# Patient Record
Sex: Female | Born: 1940 | Race: Black or African American | Hispanic: No | Marital: Single | Smoking: Former smoker
Health system: Southern US, Community
[De-identification: ages and names within clinical notes are randomized; demographics above are authoritative.]

## PROBLEM LIST (undated history)

## (undated) DIAGNOSIS — M1990 Unspecified osteoarthritis, unspecified site: Secondary | ICD-10-CM

## (undated) DIAGNOSIS — H409 Unspecified glaucoma: Secondary | ICD-10-CM

## (undated) DIAGNOSIS — E079 Disorder of thyroid, unspecified: Secondary | ICD-10-CM

## (undated) DIAGNOSIS — E039 Hypothyroidism, unspecified: Secondary | ICD-10-CM

## (undated) DIAGNOSIS — M2550 Pain in unspecified joint: Secondary | ICD-10-CM

## (undated) DIAGNOSIS — I10 Essential (primary) hypertension: Secondary | ICD-10-CM

## (undated) DIAGNOSIS — I6529 Occlusion and stenosis of unspecified carotid artery: Secondary | ICD-10-CM

## (undated) DIAGNOSIS — J300 Vasomotor rhinitis: Secondary | ICD-10-CM

## (undated) DIAGNOSIS — I272 Pulmonary hypertension, unspecified: Secondary | ICD-10-CM

## (undated) DIAGNOSIS — K219 Gastro-esophageal reflux disease without esophagitis: Secondary | ICD-10-CM

## (undated) DIAGNOSIS — T7840XA Allergy, unspecified, initial encounter: Secondary | ICD-10-CM

## (undated) DIAGNOSIS — J9611 Chronic respiratory failure with hypoxia: Secondary | ICD-10-CM

## (undated) DIAGNOSIS — J449 Chronic obstructive pulmonary disease, unspecified: Secondary | ICD-10-CM

## (undated) HISTORY — DX: Essential (primary) hypertension: I10

## (undated) HISTORY — DX: Disorder of thyroid, unspecified: E07.9

## (undated) HISTORY — PX: EYE SURGERY: SHX253

## (undated) HISTORY — DX: Pain in unspecified joint: M25.50

## (undated) HISTORY — DX: Gastro-esophageal reflux disease without esophagitis: K21.9

## (undated) HISTORY — DX: Unspecified osteoarthritis, unspecified site: M19.90

## (undated) HISTORY — PX: CATARACT EXTRACTION: SUR2

## (undated) HISTORY — DX: Occlusion and stenosis of unspecified carotid artery: I65.29

## (undated) HISTORY — PX: ABDOMINAL HYSTERECTOMY: SHX81

## (undated) HISTORY — DX: Allergy, unspecified, initial encounter: T78.40XA

---

## 1997-10-11 ENCOUNTER — Other Ambulatory Visit: Admission: RE | Admit: 1997-10-11 | Discharge: 1997-10-11 | Payer: Self-pay | Admitting: Family Medicine

## 1998-07-22 ENCOUNTER — Emergency Department (HOSPITAL_COMMUNITY): Admission: EM | Admit: 1998-07-22 | Discharge: 1998-07-22 | Payer: Self-pay | Admitting: Emergency Medicine

## 2000-06-29 ENCOUNTER — Ambulatory Visit (HOSPITAL_COMMUNITY): Admission: RE | Admit: 2000-06-29 | Discharge: 2000-06-29 | Payer: Self-pay

## 2001-07-03 ENCOUNTER — Ambulatory Visit (HOSPITAL_COMMUNITY): Admission: RE | Admit: 2001-07-03 | Discharge: 2001-07-03 | Payer: Self-pay | Admitting: Family Medicine

## 2001-12-25 ENCOUNTER — Ambulatory Visit (HOSPITAL_COMMUNITY): Admission: RE | Admit: 2001-12-25 | Discharge: 2001-12-25 | Payer: Self-pay | Admitting: Internal Medicine

## 2001-12-26 ENCOUNTER — Ambulatory Visit (HOSPITAL_COMMUNITY): Admission: RE | Admit: 2001-12-26 | Discharge: 2001-12-26 | Payer: Self-pay | Admitting: Internal Medicine

## 2001-12-26 ENCOUNTER — Encounter: Payer: Self-pay | Admitting: Internal Medicine

## 2002-07-05 ENCOUNTER — Ambulatory Visit (HOSPITAL_COMMUNITY): Admission: RE | Admit: 2002-07-05 | Discharge: 2002-07-05 | Payer: Self-pay | Admitting: Family Medicine

## 2002-09-24 ENCOUNTER — Encounter: Payer: Self-pay | Admitting: Internal Medicine

## 2002-09-24 ENCOUNTER — Ambulatory Visit (HOSPITAL_COMMUNITY): Admission: RE | Admit: 2002-09-24 | Discharge: 2002-09-24 | Payer: Self-pay

## 2003-05-20 ENCOUNTER — Emergency Department (HOSPITAL_COMMUNITY): Admission: EM | Admit: 2003-05-20 | Discharge: 2003-05-20 | Payer: Self-pay | Admitting: Emergency Medicine

## 2003-07-17 ENCOUNTER — Ambulatory Visit (HOSPITAL_COMMUNITY): Admission: RE | Admit: 2003-07-17 | Discharge: 2003-07-17 | Payer: Self-pay | Admitting: Family Medicine

## 2003-11-30 ENCOUNTER — Emergency Department (HOSPITAL_COMMUNITY): Admission: EM | Admit: 2003-11-30 | Discharge: 2003-11-30 | Payer: Self-pay | Admitting: Emergency Medicine

## 2004-03-16 ENCOUNTER — Ambulatory Visit (HOSPITAL_COMMUNITY): Admission: RE | Admit: 2004-03-16 | Discharge: 2004-03-16 | Payer: Self-pay | Admitting: Gastroenterology

## 2004-03-16 ENCOUNTER — Encounter (INDEPENDENT_AMBULATORY_CARE_PROVIDER_SITE_OTHER): Payer: Self-pay | Admitting: Specialist

## 2004-11-02 ENCOUNTER — Emergency Department (HOSPITAL_COMMUNITY): Admission: EM | Admit: 2004-11-02 | Discharge: 2004-11-02 | Payer: Self-pay | Admitting: Emergency Medicine

## 2004-11-12 ENCOUNTER — Emergency Department (HOSPITAL_COMMUNITY): Admission: EM | Admit: 2004-11-12 | Discharge: 2004-11-12 | Payer: Self-pay | Admitting: Emergency Medicine

## 2005-01-27 ENCOUNTER — Ambulatory Visit (HOSPITAL_COMMUNITY): Admission: RE | Admit: 2005-01-27 | Discharge: 2005-01-27 | Payer: Self-pay | Admitting: Internal Medicine

## 2005-03-03 ENCOUNTER — Encounter: Admission: RE | Admit: 2005-03-03 | Discharge: 2005-03-03 | Payer: Self-pay | Admitting: Family Medicine

## 2006-01-28 ENCOUNTER — Ambulatory Visit (HOSPITAL_COMMUNITY): Admission: RE | Admit: 2006-01-28 | Discharge: 2006-01-28 | Payer: Self-pay | Admitting: Family Medicine

## 2006-10-05 ENCOUNTER — Encounter: Admission: RE | Admit: 2006-10-05 | Discharge: 2006-10-05 | Payer: Self-pay | Admitting: Family Medicine

## 2007-02-02 ENCOUNTER — Ambulatory Visit (HOSPITAL_COMMUNITY): Admission: RE | Admit: 2007-02-02 | Discharge: 2007-02-02 | Payer: Self-pay | Admitting: Family Medicine

## 2007-08-24 ENCOUNTER — Ambulatory Visit (HOSPITAL_COMMUNITY): Admission: RE | Admit: 2007-08-24 | Discharge: 2007-08-24 | Payer: Self-pay | Admitting: Internal Medicine

## 2008-02-05 ENCOUNTER — Ambulatory Visit (HOSPITAL_COMMUNITY): Admission: RE | Admit: 2008-02-05 | Discharge: 2008-02-05 | Payer: Self-pay | Admitting: Family Medicine

## 2008-04-17 ENCOUNTER — Inpatient Hospital Stay (HOSPITAL_COMMUNITY): Admission: EM | Admit: 2008-04-17 | Discharge: 2008-04-21 | Payer: Self-pay | Admitting: Emergency Medicine

## 2009-02-10 ENCOUNTER — Ambulatory Visit (HOSPITAL_COMMUNITY): Admission: RE | Admit: 2009-02-10 | Discharge: 2009-02-10 | Payer: Self-pay | Admitting: Internal Medicine

## 2009-08-20 ENCOUNTER — Ambulatory Visit (HOSPITAL_COMMUNITY): Admission: RE | Admit: 2009-08-20 | Discharge: 2009-08-20 | Payer: Self-pay | Admitting: Internal Medicine

## 2009-09-29 ENCOUNTER — Ambulatory Visit: Payer: Self-pay | Admitting: Vascular Surgery

## 2009-10-19 ENCOUNTER — Emergency Department (HOSPITAL_COMMUNITY): Admission: EM | Admit: 2009-10-19 | Discharge: 2009-10-19 | Payer: Self-pay | Admitting: Emergency Medicine

## 2009-11-15 ENCOUNTER — Emergency Department (HOSPITAL_COMMUNITY): Admission: EM | Admit: 2009-11-15 | Discharge: 2009-11-15 | Payer: Self-pay | Admitting: Emergency Medicine

## 2009-11-18 ENCOUNTER — Emergency Department (HOSPITAL_COMMUNITY): Admission: EM | Admit: 2009-11-18 | Discharge: 2009-11-19 | Payer: Self-pay | Admitting: Emergency Medicine

## 2009-12-02 ENCOUNTER — Ambulatory Visit: Payer: Self-pay | Admitting: Vascular Surgery

## 2009-12-03 ENCOUNTER — Emergency Department (HOSPITAL_COMMUNITY): Admission: EM | Admit: 2009-12-03 | Discharge: 2009-12-03 | Payer: Self-pay | Admitting: Emergency Medicine

## 2009-12-16 ENCOUNTER — Ambulatory Visit (HOSPITAL_COMMUNITY): Admission: RE | Admit: 2009-12-16 | Discharge: 2009-12-16 | Payer: Self-pay | Admitting: Vascular Surgery

## 2009-12-16 ENCOUNTER — Ambulatory Visit: Payer: Self-pay | Admitting: Surgery

## 2009-12-17 ENCOUNTER — Inpatient Hospital Stay (HOSPITAL_COMMUNITY): Admission: RE | Admit: 2009-12-17 | Discharge: 2009-12-18 | Payer: Self-pay | Admitting: Vascular Surgery

## 2009-12-30 ENCOUNTER — Ambulatory Visit: Payer: Self-pay | Admitting: Vascular Surgery

## 2010-02-11 ENCOUNTER — Ambulatory Visit (HOSPITAL_COMMUNITY): Admission: RE | Admit: 2010-02-11 | Discharge: 2010-02-11 | Payer: Self-pay | Admitting: Family Medicine

## 2010-02-21 IMAGING — US US RENAL
1 series · 14 of 22 positions shown · non-contrast
Comparison: CT abdomen and pelvis 10/05/2006.

CLINICAL DATA: Acute renal failure.

ULTRASOUND URINARY TRACT COMPLETE 04/17/2008:

[Series 1: us renal · 0.28mm/px · 14 of 22 slices shown]
[im 1/22]
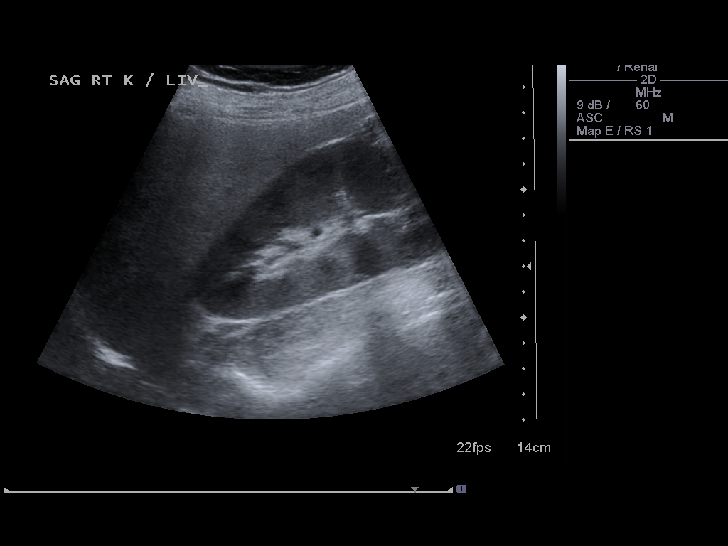
[im 3/22]
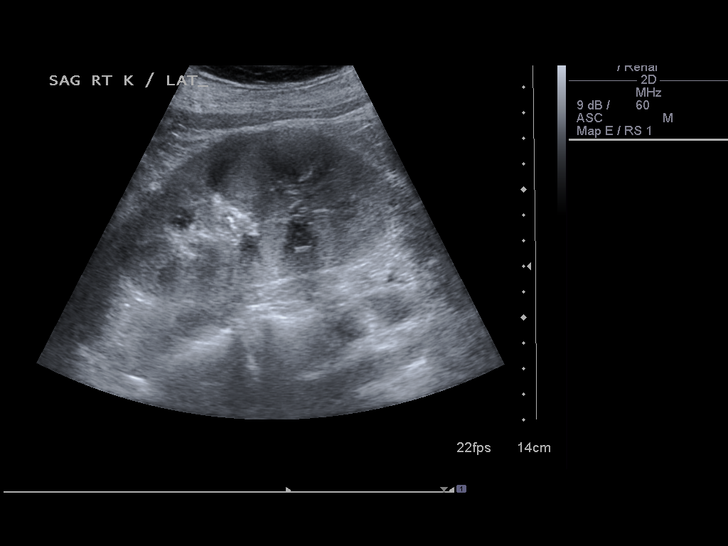
[im 4/22]
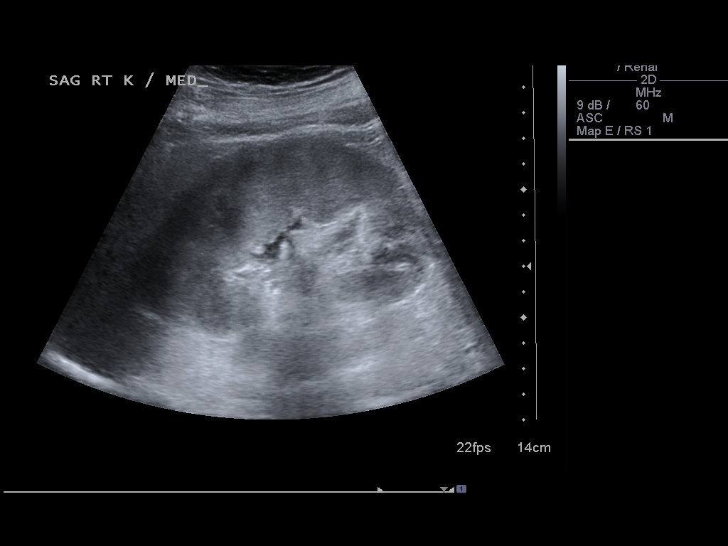
[im 6/22]
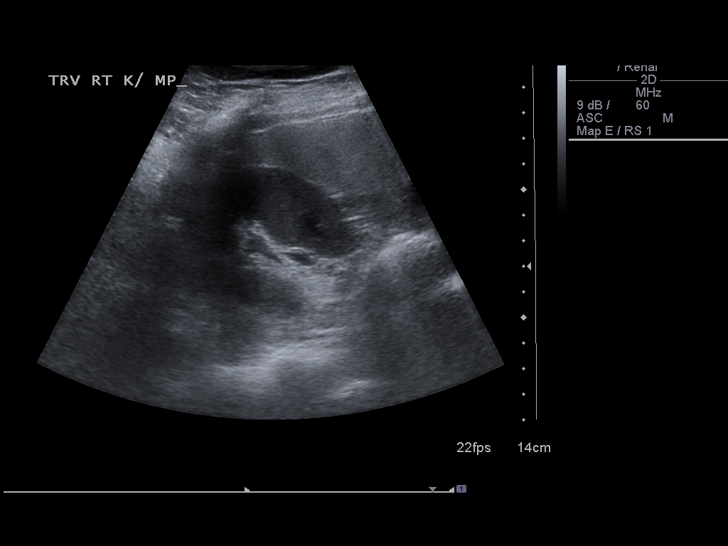
[im 8/22]
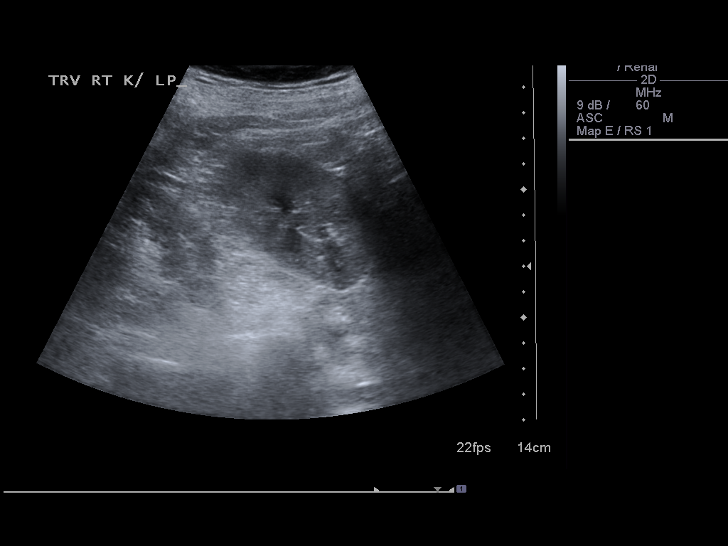
[im 9/22]
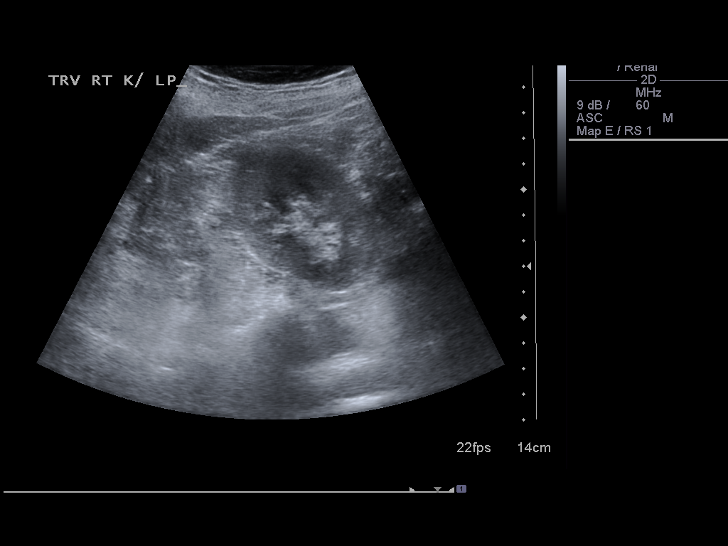
[im 11/22]
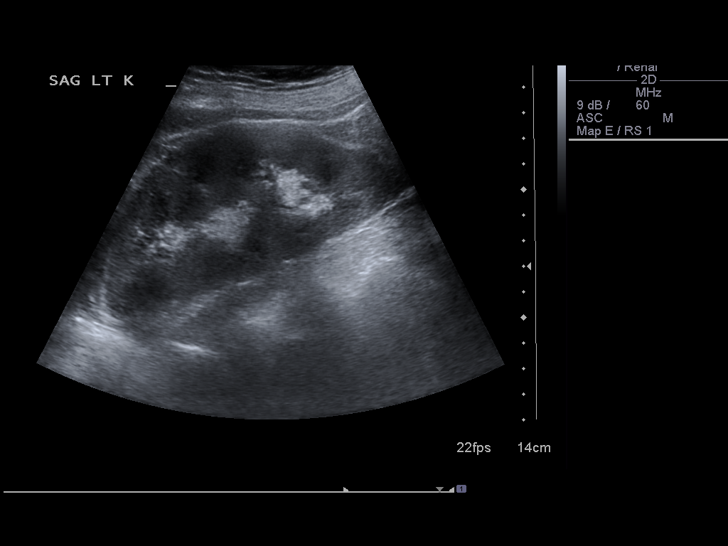
[im 12/22]
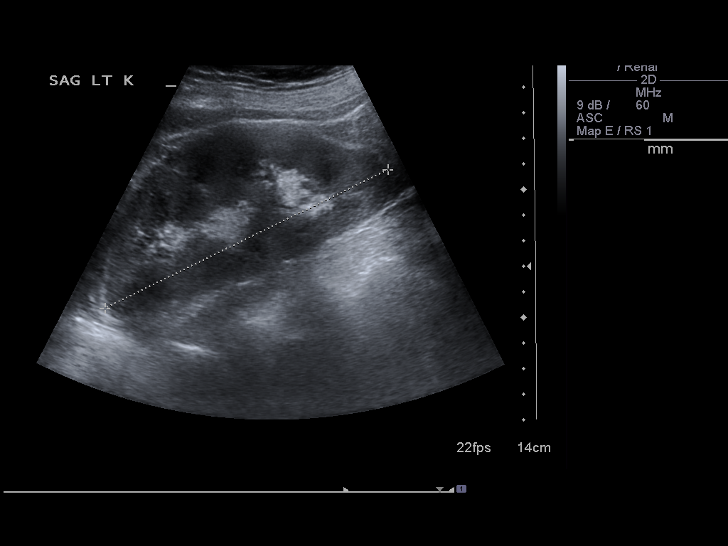
[im 14/22]
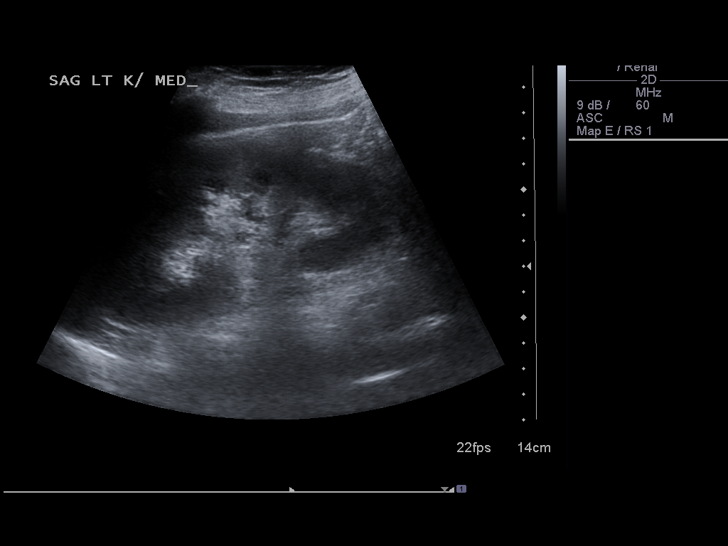
[im 15/22]
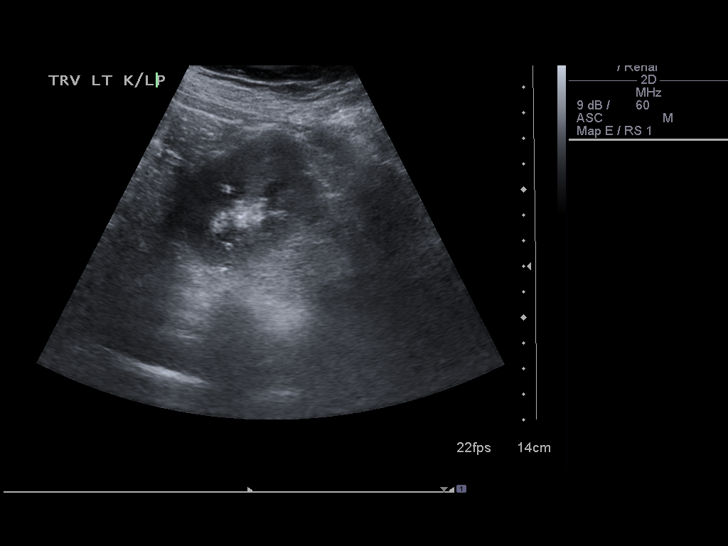
[im 17/22]
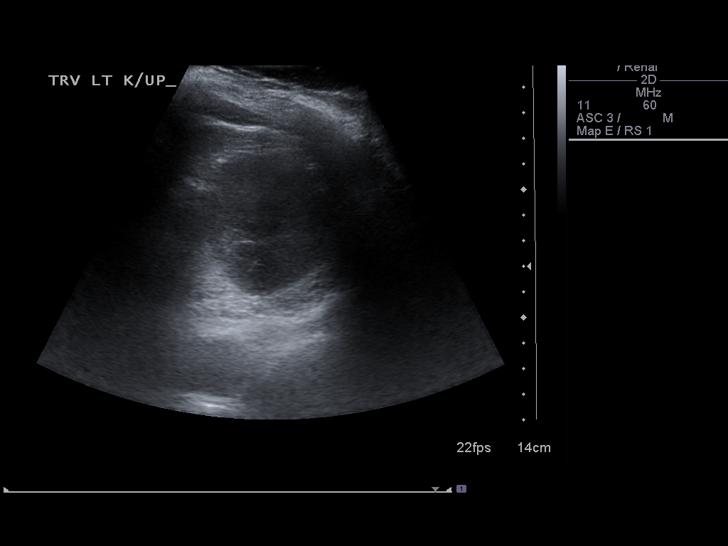
[im 19/22]
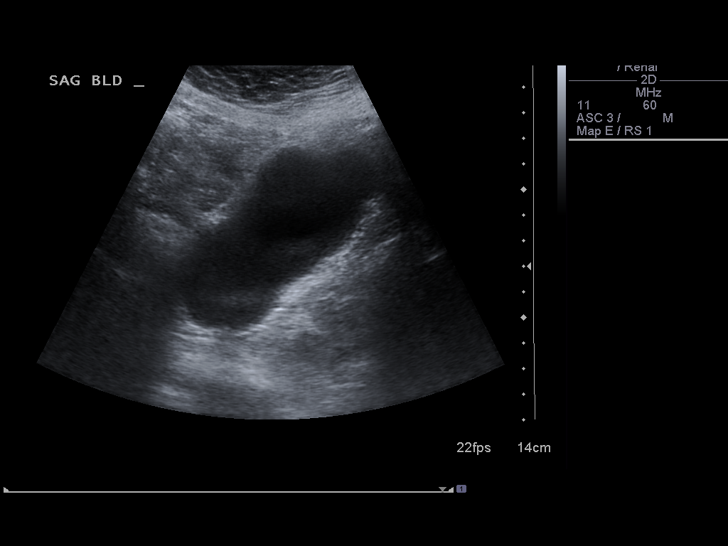
[im 20/22]
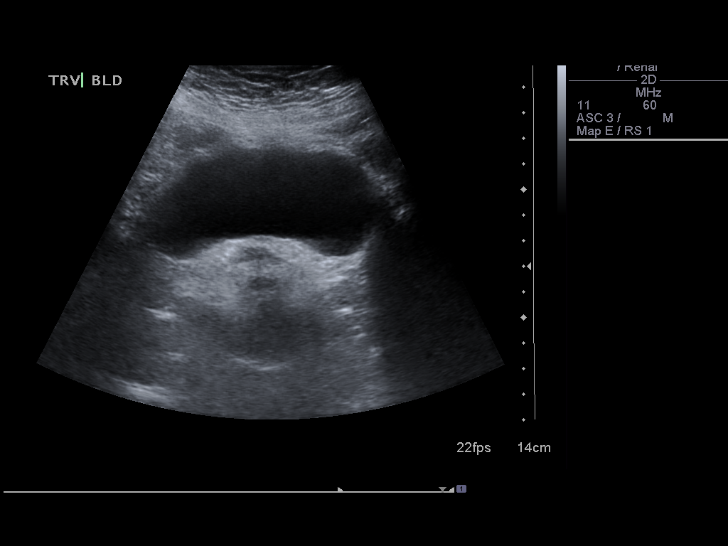
[im 22/22]
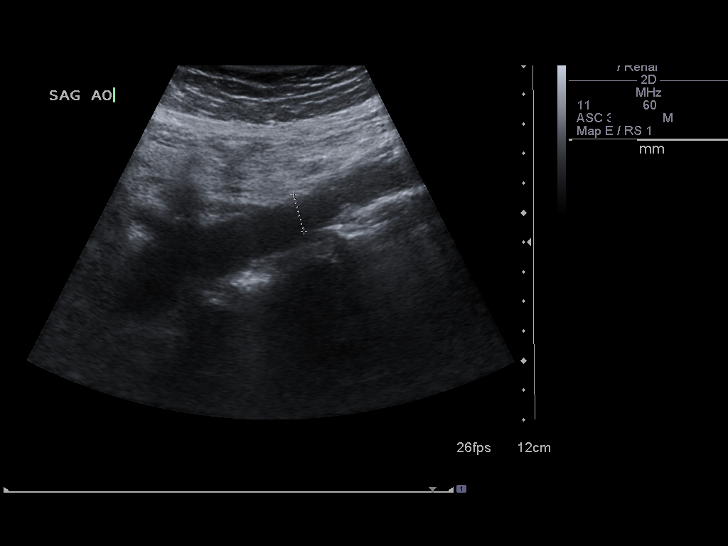

[14 of 22 positions shown; findings below may reference images not displayed]

FINDINGS: Right Kidney:  No hydronephrosis.  Well-preserved cortex.
Normal parenchymal echotexture without focal parenchymal
abnormalities.  Approximately 11.6 cm in length.

      Left Kidney: No hydronephrosis.  Well-preserved cortex.
Normal parenchymal echotexture without focal abnormalities.
Approximately 12.3 cm in length.

      Bladder: Normal in appearance.
IMPRESSION: Normal urinary tract ultrasound.

## 2010-05-02 ENCOUNTER — Encounter: Payer: Self-pay | Admitting: Emergency Medicine

## 2010-05-03 ENCOUNTER — Encounter: Payer: Self-pay | Admitting: Family Medicine

## 2010-06-25 LAB — COMPREHENSIVE METABOLIC PANEL
AST: 21 U/L (ref 0–37)
Albumin: 3.8 g/dL (ref 3.5–5.2)
Alkaline Phosphatase: 74 U/L (ref 39–117)
CO2: 22 mEq/L (ref 19–32)
Chloride: 109 mEq/L (ref 96–112)
GFR calc Af Amer: >60 mL/min (ref >60)
GFR calc non Af Amer: >60 mL/min (ref >60)
Potassium: 4.2 mEq/L (ref 3.5–5.1)
Total Bilirubin: 0.4 mg/dL (ref 0.3–1.2)

## 2010-06-25 LAB — CBC
Hemoglobin: 11.9 g/dL — ABNORMAL LOW (ref 12.0–15.0)
MCH: 30.4 pg (ref 26.0–34.0)
MCV: 93 fL (ref 78.0–100.0)
MCV: 93.9 fL (ref 78.0–100.0)
Platelets: 217 10*3/uL (ref 150–400)
Platelets: 217 10*3/uL (ref 150–400)
RBC: 3.7 MIL/uL — ABNORMAL LOW (ref 3.87–5.11)
RBC: 3.91 MIL/uL (ref 3.87–5.11)
RDW: 14.4 % (ref 11.5–15.5)
WBC: 11.9 10*3/uL — ABNORMAL HIGH (ref 4.0–10.5)
WBC: 8.9 10*3/uL (ref 4.0–10.5)

## 2010-06-25 LAB — BASIC METABOLIC PANEL
BUN: 4 mg/dL — ABNORMAL LOW (ref 6–23)
Calcium: 9.2 mg/dL (ref 8.4–10.5)
Chloride: 112 mEq/L (ref 96–112)
Creatinine, Ser: 0.75 mg/dL (ref 0.4–1.2)
GFR calc Af Amer: >60 mL/min (ref >60)
GFR calc non Af Amer: >60 mL/min (ref >60)

## 2010-06-25 LAB — POCT I-STAT, CHEM 8
BUN: 9 mg/dL (ref 6–23)
Creatinine, Ser: 0.8 mg/dL (ref 0.4–1.2)
Potassium: 3.8 mEq/L (ref 3.5–5.1)
Sodium: 142 mEq/L (ref 135–145)
TCO2: 23 mmol/L (ref 0–100)

## 2010-06-25 LAB — URINALYSIS, ROUTINE W REFLEX MICROSCOPIC
Bilirubin Urine: NEGATIVE
Hgb urine dipstick: NEGATIVE
Ketones, ur: NEGATIVE mg/dL
Nitrite: NEGATIVE
Specific Gravity, Urine: 1.016 (ref 1.005–1.030)
pH: 6 (ref 5.0–8.0)

## 2010-06-25 LAB — CROSSMATCH

## 2010-06-25 LAB — SURGICAL PCR SCREEN
MRSA, PCR: NEGATIVE
Staphylococcus aureus: NEGATIVE

## 2010-06-25 LAB — URINE MICROSCOPIC-ADD ON

## 2010-06-25 LAB — ABO/RH: ABO/RH(D): O POS

## 2010-06-26 LAB — URINALYSIS, ROUTINE W REFLEX MICROSCOPIC
Bilirubin Urine: NEGATIVE
Nitrite: NEGATIVE
Specific Gravity, Urine: 1.006 (ref 1.005–1.030)
Urobilinogen, UA: 0.2 mg/dL (ref 0.0–1.0)
pH: 6.5 (ref 5.0–8.0)

## 2010-06-26 LAB — COMPREHENSIVE METABOLIC PANEL
ALT: 14 U/L (ref 0–35)
Alkaline Phosphatase: 81 U/L (ref 39–117)
BUN: 4 mg/dL — ABNORMAL LOW (ref 6–23)
CO2: 23 mEq/L (ref 19–32)
GFR calc non Af Amer: >60 mL/min (ref >60)
Glucose, Bld: 103 mg/dL — ABNORMAL HIGH (ref 70–99)
Potassium: 3.4 mEq/L — ABNORMAL LOW (ref 3.5–5.1)
Sodium: 140 mEq/L (ref 135–145)

## 2010-06-26 LAB — CBC
HCT: 39.2 % (ref 36.0–46.0)
Hemoglobin: 13 g/dL (ref 12.0–15.0)
Hemoglobin: 13.5 g/dL (ref 12.0–15.0)
MCH: 30.8 pg (ref 26.0–34.0)
MCHC: 33.2 g/dL (ref 30.0–36.0)
MCHC: 33.1 g/dL (ref 30.0–36.0)
MCV: 92 fL (ref 78.0–100.0)
Platelets: 259 10*3/uL (ref 150–400)
RDW: 13.8 % (ref 11.5–15.5)

## 2010-06-26 LAB — BASIC METABOLIC PANEL
BUN: 4 mg/dL — ABNORMAL LOW (ref 6–23)
CO2: 22 mEq/L (ref 19–32)
Calcium: 9.5 mg/dL (ref 8.4–10.5)
Creatinine, Ser: 0.9 mg/dL (ref 0.4–1.2)
Glucose, Bld: 109 mg/dL — ABNORMAL HIGH (ref 70–99)

## 2010-06-26 LAB — POCT CARDIAC MARKERS: Myoglobin, poc: 42.9 ng/mL (ref 12–200)

## 2010-06-26 LAB — DIFFERENTIAL
Basophils Absolute: 0.1 10*3/uL (ref 0.0–0.1)
Basophils Absolute: 0 10*3/uL (ref 0.0–0.1)
Basophils Relative: 1 % (ref 0–1)
Basophils Relative: 0 % (ref 0–1)
Eosinophils Absolute: 0.1 10*3/uL (ref 0.0–0.7)
Eosinophils Absolute: 0 10*3/uL (ref 0.0–0.7)
Monocytes Relative: 4 % (ref 3–12)
Neutro Abs: 4 10*3/uL (ref 1.7–7.7)
Neutrophils Relative %: 46 % (ref 43–77)
Neutrophils Relative %: 67 % (ref 43–77)

## 2010-06-26 LAB — URINE CULTURE: Culture  Setup Time: 201108241840

## 2010-06-28 LAB — URINALYSIS, ROUTINE W REFLEX MICROSCOPIC
Hgb urine dipstick: NEGATIVE
Protein, ur: NEGATIVE mg/dL
Urobilinogen, UA: 0.2 mg/dL (ref 0.0–1.0)

## 2010-06-28 LAB — CBC
Hemoglobin: 13.4 g/dL (ref 12.0–15.0)
MCH: 31.5 pg (ref 26.0–34.0)
MCV: 93.4 fL (ref 78.0–100.0)
RBC: 4.26 MIL/uL (ref 3.87–5.11)

## 2010-06-28 LAB — BASIC METABOLIC PANEL
CO2: 23 mEq/L (ref 19–32)
Chloride: 110 mEq/L (ref 96–112)
Creatinine, Ser: 0.76 mg/dL (ref 0.4–1.2)
GFR calc Af Amer: >60 mL/min (ref >60)
Sodium: 142 mEq/L (ref 135–145)

## 2010-06-28 LAB — URINE MICROSCOPIC-ADD ON

## 2010-06-30 ENCOUNTER — Other Ambulatory Visit (INDEPENDENT_AMBULATORY_CARE_PROVIDER_SITE_OTHER): Payer: Medicare Other

## 2010-06-30 ENCOUNTER — Ambulatory Visit (INDEPENDENT_AMBULATORY_CARE_PROVIDER_SITE_OTHER): Payer: Medicare Other | Admitting: Vascular Surgery

## 2010-06-30 DIAGNOSIS — Z48812 Encounter for surgical aftercare following surgery on the circulatory system: Secondary | ICD-10-CM

## 2010-06-30 DIAGNOSIS — I6529 Occlusion and stenosis of unspecified carotid artery: Secondary | ICD-10-CM

## 2010-06-30 DIAGNOSIS — I771 Stricture of artery: Secondary | ICD-10-CM

## 2010-07-01 NOTE — Assessment & Plan Note (Signed)
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  Since that time her ischemic symptoms of the right upper extremity have been completely resolved with no claudication, numbness, weakness or cramping discomfort.  She occasionally will have some blurred vision in both eyes.  She denies any hemispheric TIAs such as hemiparesis, amaurosis fugax, diplopia, blurred vision or syncope.  CHRONIC MEDICAL PROBLEMS WHICH ARE STABLE: 1. Hypertension. 2. GERD. 3. Allergic rhinitis. 4. Hypothyroidism. 5. Bilateral cataract surgery. 6. Negative for coronary disease, diabetes or hyperlipidemia.  SOCIAL HISTORY:  She is single, has three children, is retired.  Quit smoking 30 years.  Does not use alcohol.  REVIEW OF SYSTEMS:  Negative for chest pain, dyspnea on exertion, anorexia or weight loss, orthopnea.  Ambulates long distances without claudication.  All other systems in complete review of systems are negative.  PHYSICAL EXAMINATION:  Blood pressure 125/71 in the right arm, 145/75 in the left arm; heart rate is 80; respirations 16.  Generally, she is a well-developed, well-nourished female in no apparent distress, alert and oriented x3.  HEENT:  Exam is normal for age.  EOMs intact.  Lungs: Clear to auscultation.  No rhonchi or wheezing.  Cardiovascular:  A regular rhythm, no murmurs.  Carotid pulses are 3+.  There is a harsh bruit on the right, radiated from the supraclavicular area.  Right upper extremity has a 3+ brachial and 2+ radial pulse.  Left upper extremity has 3+ brachial and 3+ radial pulse.  Abdomen is soft, nontender, with no  masses.  Lower extremity exam has 3+ dorsalis pedis pulses palpable.  Today I ordered a carotid duplex exam, which I reviewed and interpreted. She has a 20-mm gradient between the right left arm, which was 80 mm preoperatively.  She has biphasic antegrade flow in both vertebrals with no evidence of flow reduction in her carotids.  She does have a high velocity at the carotid-subclavian anastomosis at 508 cm/sec.  She is asymptomatic.  We will watch this area.  If she develops any new symptoms of the right arm or otherwise to get in touch with [place], otherwise see her in 9 months and follow up carotid duplex exam at that time.    Nelda Severe Kellie Simmering, M.D. Electronically Signed  JDL/MEDQ  D:  06/30/2010  T:  07/01/2010  Job:  [id number]

## 2010-07-07 NOTE — Procedures (Unsigned)
CAROTID DUPLEX EXAM  INDICATION:  Followup carotid stenosis.  HISTORY: Diabetes:  No. Cardiac:  No. Hypertension:  Yes. Smoking:  Previous. Previous Surgery:  Right CCA to subclavian bypass in 12/2009. CV History:  Possible TIAs. Amaurosis Fugax No, Paresthesias No, Hemiparesis No Right eye experiences intermittent blurry vision.                                      RIGHT             LEFT Brachial systolic pressure:         136               156 Brachial Doppler waveforms:         Biphasic          Triphasic Vertebral direction of flow:        Antegrade         Antegrade DUPLEX VELOCITIES (cm/sec) CCA peak systolic                   109               161 ECA peak systolic                   113               096 ICA peak systolic                   92                82 ICA end diastolic                   23                18 PLAQUE MORPHOLOGY:                  Heterogeneous     Heterogeneous PLAQUE AMOUNT:                      Mild              Mild PLAQUE LOCATION:                    CCA, ICA          CCA, ICA  IMPRESSION: 1. 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 2. Bilateral external carotid arteries appear patent. 3. Abnormal blunted antegrade flow in the right vertebral artery. 4. Elevated velocities suggestive of hemodynamically significant     stenosis involving the right subclavian to common carotid artery     bypass with a peak systolic velocity of 045 cm/sec.  ___________________________________________ Nelda Severe. Kellie Simmering, M.D.  SH/MEDQ  D:  06/30/2010  T:  06/30/2010  Job:  [id number]

## 2010-07-27 LAB — CALCIUM: Calcium: 7.6 mg/dL — ABNORMAL LOW (ref 8.4–10.5)

## 2010-07-27 LAB — URINALYSIS, ROUTINE W REFLEX MICROSCOPIC
Bilirubin Urine: NEGATIVE
Bilirubin Urine: NEGATIVE
Glucose, UA: NEGATIVE mg/dL
Hgb urine dipstick: NEGATIVE
Ketones, ur: NEGATIVE mg/dL
Nitrite: NEGATIVE
Specific Gravity, Urine: 1.007 (ref 1.005–1.030)
Specific Gravity, Urine: 1.008 (ref 1.005–1.030)
Urobilinogen, UA: 0.2 mg/dL (ref 0.0–1.0)
pH: 6 (ref 5.0–8.0)

## 2010-07-27 LAB — IRON AND TIBC
Iron: 46 ug/dL (ref 42–135)
Iron: 47 ug/dL (ref 42–135)
UIBC: 143 ug/dL
UIBC: 171 ug/dL

## 2010-07-27 LAB — CARDIAC PANEL(CRET KIN+CKTOT+MB+TROPI)
CK, MB: 1.4 ng/mL (ref 0.3–4.0)
Relative Index: INVALID (ref 0.0–2.5)
Relative Index: INVALID (ref 0.0–2.5)
Total CK: 59 U/L (ref 7–177)
Troponin I: 0.03 ng/mL (ref 0.00–0.06)

## 2010-07-27 LAB — CBC
HCT: 29.7 % — ABNORMAL LOW (ref 36.0–46.0)
HCT: 31.8 % — ABNORMAL LOW (ref 36.0–46.0)
HCT: 35.5 % — ABNORMAL LOW (ref 36.0–46.0)
MCHC: 33.2 g/dL (ref 30.0–36.0)
MCHC: 33.5 g/dL (ref 30.0–36.0)
MCHC: 33.9 g/dL (ref 30.0–36.0)
MCV: 90.1 fL (ref 78.0–100.0)
MCV: 90.2 fL (ref 78.0–100.0)
MCV: 90.6 fL (ref 78.0–100.0)
MCV: 91.1 fL (ref 78.0–100.0)
Platelets: 298 10*3/uL (ref 150–400)
Platelets: 321 10*3/uL (ref 150–400)
Platelets: 348 10*3/uL (ref 150–400)
Platelets: 383 10*3/uL (ref 150–400)
RBC: 3.53 MIL/uL — ABNORMAL LOW (ref 3.87–5.11)
RDW: 13.7 % (ref 11.5–15.5)
RDW: 13.9 % (ref 11.5–15.5)
WBC: 11.6 10*3/uL — ABNORMAL HIGH (ref 4.0–10.5)
WBC: 12.6 10*3/uL — ABNORMAL HIGH (ref 4.0–10.5)

## 2010-07-27 LAB — URINE CULTURE
Colony Count: NO GROWTH
Culture: NO GROWTH
Special Requests: NEGATIVE

## 2010-07-27 LAB — PHOSPHORUS: Phosphorus: 5.1 mg/dL — ABNORMAL HIGH (ref 2.3–4.6)

## 2010-07-27 LAB — BASIC METABOLIC PANEL
BUN: 63 mg/dL — ABNORMAL HIGH (ref 6–23)
BUN: 89 mg/dL — ABNORMAL HIGH (ref 6–23)
CO2: 20 mEq/L (ref 19–32)
CO2: 32 mEq/L (ref 19–32)
Chloride: 104 mEq/L (ref 96–112)
Chloride: 107 mEq/L (ref 96–112)
Creatinine, Ser: 10.68 mg/dL — ABNORMAL HIGH (ref 0.4–1.2)
Creatinine, Ser: 7.08 mg/dL — ABNORMAL HIGH (ref 0.4–1.2)
Glucose, Bld: 93 mg/dL (ref 70–99)
Potassium: 3.2 mEq/L — ABNORMAL LOW (ref 3.5–5.1)
Potassium: 3.2 mEq/L — ABNORMAL LOW (ref 3.5–5.1)

## 2010-07-27 LAB — ANA: Anti Nuclear Antibody(ANA): NEGATIVE

## 2010-07-27 LAB — POCT I-STAT 3, ART BLOOD GAS (G3+)
Acid-base deficit: 12 mmol/L — ABNORMAL HIGH (ref 0.0–2.0)
Bicarbonate: 11.6 mEq/L — ABNORMAL LOW (ref 20.0–24.0)
O2 Saturation: 97 %
Patient temperature: 98.5
TCO2: 12 mmol/L (ref 0–100)

## 2010-07-27 LAB — LIPID PANEL
LDL Cholesterol: 102 mg/dL — ABNORMAL HIGH (ref 0–99)
VLDL: 11 mg/dL (ref 0–40)

## 2010-07-27 LAB — MAGNESIUM: Magnesium: 1.7 mg/dL (ref 1.5–2.5)

## 2010-07-27 LAB — COMPREHENSIVE METABOLIC PANEL
ALT: 14 U/L (ref 0–35)
AST: 21 U/L (ref 0–37)
Albumin: 2.8 g/dL — ABNORMAL LOW (ref 3.5–5.2)
Albumin: 3.7 g/dL (ref 3.5–5.2)
BUN: 108 mg/dL — ABNORMAL HIGH (ref 6–23)
Calcium: 6.8 mg/dL — ABNORMAL LOW (ref 8.4–10.5)
Chloride: 104 mEq/L (ref 96–112)
Creatinine, Ser: 12.62 mg/dL — ABNORMAL HIGH (ref 0.4–1.2)
Creatinine, Ser: 4.62 mg/dL — ABNORMAL HIGH (ref 0.4–1.2)
GFR calc Af Amer: 11 mL/min — ABNORMAL LOW (ref >60)
Sodium: 141 mEq/L (ref 135–145)
Total Bilirubin: 0.3 mg/dL (ref 0.3–1.2)
Total Protein: 8.2 g/dL (ref 6.0–8.3)

## 2010-07-27 LAB — SODIUM, URINE, RANDOM: Sodium, Ur: 94 mEq/L

## 2010-07-27 LAB — DIFFERENTIAL
Basophils Absolute: 0.1 10*3/uL (ref 0.0–0.1)
Lymphocytes Relative: 21 % (ref 12–46)
Monocytes Absolute: 0.8 10*3/uL (ref 0.1–1.0)
Monocytes Relative: 5 % (ref 3–12)
Neutro Abs: 11.2 10*3/uL — ABNORMAL HIGH (ref 1.7–7.7)

## 2010-07-27 LAB — CK TOTAL AND CKMB (NOT AT ARMC)
CK, MB: 2.3 ng/mL (ref 0.3–4.0)
Total CK: 63 U/L (ref 7–177)

## 2010-07-27 LAB — RETICULOCYTES
RBC.: 3.65 MIL/uL — ABNORMAL LOW (ref 3.87–5.11)
Retic Ct Pct: 0.6 % (ref 0.4–3.1)

## 2010-07-27 LAB — HEMOGLOBIN A1C: Hgb A1c MFr Bld: 6.2 % — ABNORMAL HIGH (ref 4.6–6.1)

## 2010-07-27 LAB — TSH: TSH: 1.001 u[IU]/mL (ref 0.350–4.500)

## 2010-07-27 LAB — CALCIUM / CREATININE RATIO, URINE: Calcium/Creat.Ratio: 0

## 2010-07-27 LAB — FERRITIN: Ferritin: 259 ng/mL (ref 10–291)

## 2010-08-25 NOTE — Assessment & Plan Note (Signed)
OFFICE VISIT   [name], [name]  DOB:  10/19/1940                                       12/02/2009  WOEHO#:[id number]   The patient returns today having been evaluated by me in June of this  year for right subclavian occlusive disease.  At that time she had no  right arm or neurologic symptoms.  She was started on aspirin by me at  that time.  Since then she has been having blurred vision in her right  eye intermittently.  She has had no hemiparesis or aphasia or other  specific neurologic symptoms other than occasional weakness and numbness  in the right arm which she was not experiencing previously.  This  occasionally occurs during the daytime or at night.  She has no history  of stroke.   CHRONIC MEDICAL PROBLEMS:  1. Hypertension.  2. GERD.  3. Allergic rhinitis.  4. Hypothyroidism.  5. Bilateral cataracts post surgery.  6. Negative for coronary artery disease, diabetes or hyperlipidemia.   SOCIAL HISTORY:  She is single, has three children, is retired.  Quit  smoking 30 years.  Does not use alcohol.   REVIEW OF SYSTEMS:  Negative for chest pain, dyspnea on exertion.  Denies any pulmonary problems.  All other systems are negative in review  of systems.   PHYSICAL EXAMINATION:  Vital signs:  Blood pressure is 102/80 on the  right, 152/75 on the left, heart rate is 85, respirations 14.  General:  A well-developed, well-nourished female, alert and oriented times 3.  She is in no distress.  HEENT:  Exam is normal for age.  EOMs intact.  Lungs:  Clear to auscultation.  No rhonchi or wheezing.  Cardiovascular:  Regular rhythm, no murmurs.  Carotid pulses are 3+, soft bruit on the  right in the supraclavicular area.  Abdomen:  Soft, nontender with no  masses.  Lower extremity:  Exam reveals 3+ femoral and distal pulses.   Today I ordered a repeat carotid duplex exam with the previous study  having been done at [facility] in June but not in our  lab.  I  evaluated that today and it reveals [name] right subclavian occlusive  disease with retrograde flow in the right vertebral artery and a  possible proximal right common carotid stenosis with no internal carotid  stenosis.   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  I have scheduled her for an angiogram to be done by  Dr. Trula Slade on Tuesday September 6 to see if she is a candidate for PTA  and stenting of her right subclavian artery or whether surgical approach  would be necessary.  Risks and benefits were thoroughly discussed and  she would like to proceed.     [name] [name], M.D.  Electronically Signed   JDL/MEDQ  D:  12/02/2009  T:  12/03/2009  Job:  [id number]

## 2010-08-25 NOTE — Assessment & Plan Note (Signed)
OFFICE VISIT   [name], [name]  DOB:  11-28-1940                                       12/30/2009  SXQKS#:[id number]   The patient returns for initial follow-up 2 weeks post right carotid  subclavian anastomosis for right arm claudication and possible posterior  circulation TIAs.  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  She  had an uneventful operative procedure, returns today with no complaints  of hoarseness, dysphagia or hemispheric TIAs.  She states that her right  arm symptoms are dramatically improved and she continues to have some  mild visual symptoms in the right eye.   PHYSICAL EXAMINATION:  Today blood pressure 156/76 in the left arm,  139/91 in the right arm, heart rate 103, temperature 97.4.  The right  supraclavicular incision is well-healed.  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  She has 3+  carotid and brachial pulses bilaterally.  Neurologic exam is otherwise  normal.  There is a harsh bruit on the right side.   I think that she does have some mild discrepancy in her blood pressures  because of the proximal common carotid and/or innominate stenosis which  was not flow-limiting and is present.  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  In general, I  think she is getting along well.  She will continue taking aspirin 81 mg  daily.  Return to see Dr. Joaquim Lai regarding her metoprolol which was  started postoperatively.  Return to see [place] in 6 months with follow up  carotid duplex exam.     Nelda Severe. Kellie Simmering, M.D.  Electronically Signed   JDL/MEDQ  D:  12/30/2009  T:  12/31/2009  Job:  [id number]

## 2010-08-25 NOTE — H&P (Signed)
NAMEMarland Kitchen  PEGGIE, [name] NO.:  [id number]   MEDICAL RECORD NO.:  [id number]          PATIENT TYPE:  EMS   LOCATION:  MAJO                         FACILITY:  [facility]   PHYSICIAN:  Karlyn Agee, M.D. DATE OF BIRTH:  May 17, 1940   DATE OF ADMISSION:  04/17/2008  DATE OF DISCHARGE:                              HISTORY & PHYSICAL   PRIMARY CARE PHYSICIAN:  [facility].   GASTROENTEROLOGIST:  Dr. Nelwyn Salisbury.   CHIEF COMPLAINT:  Abnormal labs.   HISTORY OF PRESENT ILLNESS:  This is a 70 year old African American lady  with a history of hypertension, GERD and hypothyroidism, whose primary  care Adith Tejada is an urgent care facility who since the end of November  has been having irrigating abdominal pain.  Eventually, the patient  visited her urgent care facility on April 12, 2008.  She was diagnosed  with possible colitis and prescribed Cipro and Flagyl.  The patient went  home, but became very nauseous in response to this medication.  She  called into the urgent care and they reduced her to half-dose of the  same medications.  The patient continued to feel nauseous and sick and  she was referred to her gastroenterologist, Dr. Collene Mares.  Dr. Collene Mares did some  blood work, then called her at home and advised her to come to the [facility] emergency room for evaluation and probable admission.   Other than the above, the patient saw Dr. Collene Mares in 2005, for a  colonoscopy and at that time had polyps removed from the colon.  There  was no evidence of diverticulitis or diverticulosis.  There was evidence  of hemorrhoids.  Biopsy results from that colonoscopy revealed  adenomatous polyps, but no evidence of dysplasia or invasive malignancy.  The patient's serum creatinine at that time was 0.8.  The patient cannot  remember any blood work being done since then.   The patient denies any episodes of nausea prior to taking the Cipro and  Flagyl.  She does have nocturia,  but she feels this is because she  drinks so much water because she has always been advised to drink plenty  of water.  She has no genitourinary problems such as frequency, dysuria  or incontinence.  She is intolerant to aspirin because it tends to cause  GI bleed, but she does episodically take Motrin and Celebrex for her  arthritis or general body aches.  She also uses lisinopril for her blood  pressure.   PAST MEDICAL HISTORY:  1. Hypertension.  2. GERD.  3. Hyperlipidemia.  4. Hypothyroidism.   MEDICATIONS:  1. Cipro 250 mg twice daily.  2. Metronidazole 250 mg twice daily.  3. Motrin p.r.n.  4. Celebrex p.r.n.  5. Allegra 180 mg daily.  6. Astelin nasal spray p.r.n.  7. Synthroid 100 mcg daily.  8. Omeprazole 20 mg daily.  9. Lisinopril 20 mg daily.  10.Simvastatin 20 mg daily.  11.Proventil MDI p.r.n.   ALLERGIES:  ASPIRIN CAUSES GI IRRITATION.   SOCIAL HISTORY:  She denies tobacco, alcohol or illicit drug use.  She  is a retired [employer].   FAMILY HISTORY:  She denies any family history of kidney disease,  cardiac disease or hypertension.   REVIEW OF SYSTEMS:  On a 10-point review of systems other than noted  above is unremarkable.  The patient says she exercises regularly by  climbing by a hill in her backyard and probably walks 3 miles per day.   PHYSICAL EXAMINATION:  GENERAL:  A very pleasant healthy looking 28-year-  old African American lady lying on the stretcher in no acute distress.  VITAL SIGNS:  Her temperature is 98.6, pulse 93, respirations 16, blood  pressure 165/73.  She is saturating at 100% on 2 liters.  She is having  no pain.  HEENT:  Her pupils are round and equal.  Mucous membranes pink,  anicteric.  She does appear overtly dehydrated.  NECK:  No cervical lymphadenopathy or thyromegaly.  She has distended  external jugular veins.  CHEST:  Clear to auscultation bilaterally.  CARDIOVASCULAR:  Regular rhythm without murmur.   ABDOMEN:  Obese, soft and nontender.  She has normal abdominal bowel  sounds.  EXTREMITIES:  Without edema.  She has 2+ bounding dorsalis pedis pulses  bilaterally.  CENTRAL NERVOUS SYSTEM:  Cranial nerves II through XII are grossly  intact and she has no focal neurologic deficit.   LABORATORY DATA:  Her white count is 15.4, hemoglobin 11.7, MCV 90.6,  platelets 283.  She has 73% neutrophils, absolute granulocyte count is  11.2.  Her differential is otherwise normal.  Her serum chemistry is  significant for a sodium of 139, potassium 3.6, chloride 109, BUN is low  at 13.  Her glucose is 123, BUN 108, creatinine is 12.6, calcium 9.1,  albumin 3.7, total protein 8.2.  Liver function tests are completely  normal.  There is no urinalysis or other imaging studies available to me  at this time.  Please note, computer records indicate that the patient  had a CT scan of the abdomen and pelvis in June 2008, which was normal  and showed no acute findings, but did show a nonobstructing stone in the  left kidney.  This study was done with contrast, so presumably her serum  creatinine was normal at that time.   ASSESSMENT:  1. Acute renal failure.  2. Hypertension.  3. History of gastroesophageal reflux disease.  4. History of hypothyroidism.  5. Leukocytosis.   PLAN:  1. Will check this lady's ABG.  In any case, will admit her for      hydration with bicarbonate.  Will consult the nephrology service      and if this lady's renal failure is not resolving, she may come to      dialysis.  2. Will withhold lisinopril and control her blood pressure with low-      dose beta-blocker for the time being.  If she is intolerant to beta-      blockers, we will probably use hydralazine.  3. Will discontinue Cipro and Flagyl for the time being since there is      no evidence of colitis, but we will check her urinalysis and get      ultrasound of her kidneys to look for possible stone or       hydronephrosis and assess her kidney size.  4. Other plans as per orders.      Karlyn Agee, M.D.  Electronically Signed     LC/MEDQ  D:  04/17/2008  T:  04/17/2008  Job:  [id number]   cc:   [facility]  Nelwyn Salisbury, M.D.

## 2010-08-25 NOTE — Consult Note (Signed)
NAMEHERMELA, [name] NO.:  [id number]   MEDICAL RECORD NO.:  [id number]          PATIENT TYPE:  INP   LOCATION:  5502                         FACILITY:  [facility]   PHYSICIAN:  James L. Deterding, M.D.DATE OF BIRTH:  June 13, 1940   DATE OF CONSULTATION:  DATE OF DISCHARGE:                                 CONSULTATION   REASON FOR CONSULTATION:  Acute renal failure.   HISTORY OF PRESENT ILLNESS:  This is a 70 year old female whose history  includes hypertension, she knows, just little over a year, treated and  managed on lisinopril, degenerative joint disease on ibuprofen and  Celebrex, hypothyroidism about 40 years on Synthroid, history of GERD,  hyperlipidemia on simvastatin, and seasonal allergies managed with  Allegra.  She in November started having some abdominal pain and  apparently went to Urgent Care and was treated about 2 weeks ago with  Cipro and subsequently did not tolerate 500 mg b.i.d. of Cipro and 500  mg t.i.d. of Flagyl, and the doses were cut to 250 b.i.d. of Cipro and  250 twice a day of Flagyl.  She still was not feeling well.  She did not  have nausea or vomiting.  Called back, was referred to GI.  She was in  GI, and GI Dr. Collene Mares drew some blood work on her and advised her to come  to [facility] yesterday.  She has history of some polyps  in the past and has seen GI for that, but she has had no recurrent GI  symptoms.   She has no history of renal disease or stones.  No family history of  renal disease.  No family history of inherited eye defects, hearing  defects, musculoskeletal defects.  She has had no near syncope.  No skin  rash, sores, or ulcers of mouth.   MEDICATIONS:  1. Cipro 250 mg b.i.d.  2. Flagyl 250 mg b.i.d.  3. Motrin p.r.n.  4. Celebrex p.r.n..  5. Allegra 180 mg a day.  6. Astelin nasal spray.  7. Synthroid 100 mcg a day.  8. Omeprazole 20 mg a day.  9. Lisinopril 20 mg a day.  10.Simvastatin 20 mg a  day.  11.Carvedilol p.r.n.   REVIEW OF SYSTEMS:  HEENT:  She has had cataracts resected and lens  implants.  She also has had tubes in her ears a numbers of years ago.  She has had no headaches or neurologic symptoms.  GI:  She has 2-3 loose bowel movements a day.  Now, it is worse in the  recent past.  No nausea or vomiting, was not eating well.  No history of  hepatitis or jaundice.  PULMONARY:  She smoked about 5 to 10 pack years, quit about 40 years  ago.  No asthma she states, but she has lot of hay fever and uses the  Allegra but also uses Proventil.  SKIN:  Without rashes.  MUSCULOSKELETAL:  She has some arthritis to her hands and her feet.  She  uses the Celebrex and ibuprofen for that.  GENITOURINARY:  No symptoms.  She has  had a hysterectomy in the past.  NEUROLOGIC:  No problems.   PAST MEDICAL HISTORY:  She has cataracts.  She has had the tubes.  She  has had hysterectomy, there were 3 operations.   MEDICATIONS:  Meds as above.   ALLERGIES:  Aspirin irritates her stomach but no other allergies.   FAMILY HISTORY:  Father died in his 3s of throat cancer.  Mother died  after a fall and hip fracture and complications afterwards in her 43s.  She has a sister with asthma.   SOCIAL HISTORY:  She has tobacco history as above.  No alcohol or drug  abuse.   PHYSICAL EXAMINATION:  GENERAL:  No acute distress.  She is pale.  VITAL SIGNS:  Blood pressure 88/60 supine and going to 74/48 standing.  HEENT:  Showed fundi unremarkable.  Pharynx unremarkable.  NECK:  Without masses or thyromegaly.  CARDIOVASCULAR:  Regular rhythm at 84.  Grade 7-5/9 systolic ejection  murmur best heard at the left sternal border.  PMI is 10 cm out from the  left midclavicular line in the fifth intercostal space.  No lifts,  heaves, or thrills.  No edema, pulses are 2+/4+, no bruits.  LUNGS:  Reveal no rales, rhonchi, or wheezes.  Normal expansion, normal  breath sounds.  ABDOMEN:  Positive bowel  sounds.  No organomegaly.  Soft and nontender.  SKIN:  Without rashes.  Multiple scars.  She does have multiple scars,  otherwise, unremarkable.  No significant adenopathy in the axilla or in  the supraclavicular region, but has posterior cervical adenopathy.  MUSCULOSKELETAL:  Mild changes in PIPs, DIPs, and MCPs.   LABORATORY DATA:  Ultrasound of her kidneys, right is 11.6 and left 4.3  cm.  Sodium 142, potassium 3.2, chloride 107, bicarbonate 20, creatinine  10.7, BUN 89, glucose 98, creatinine 12.6 yesterday and it is down to  10.7 today.  Hemoglobin 10.8, white count 11.6, platelet 248,000.  Urinalysis is benign without cellular elements.   ASSESSMENT:  1. Acute kidney injury, mildly uremic and acidemic.  Urinalysis is      totally benign.  It is not consistent with inflammatory disease.      The cause is most likely hemodynamic of  low blood pressure in the      setting of an ACE inhibitor and nonsteroidals.  Need to rule out      acute interstitial disease to Cipro but no cells in the urine      argues against that.  I do not think she has an acute myelopathy.      She is not obstructed by ultrasound.  2. Anemia secondary to acute illness.  3. Abdominal discomfort, the cause of this is not really clear as she      has a totally benign exam.  4. History of hypertensionthat is probably playing some roll.  5. Hypothyroidism.  6. Hyperlipidemia.   PLAN:  1. Urine, sodium, and creatinine.  2. Urinalysis.  3. Check her PTH.  4. IV bicarbonate.  5. IV fluids.  6. If not better in 24 hours, hemodialysis.  7. Need her old record.           ______________________________  Joyice Faster. Deterding, M.D.     JLD/MEDQ  D:  04/18/2008  T:  04/19/2008  Job:  [id number]

## 2010-08-25 NOTE — Discharge Summary (Signed)
[name], [name] NO.:  [id number]   MEDICAL RECORD NO.:  [id number]          PATIENT TYPE:  INP   LOCATION:  5502                         FACILITY:  [facility]   PHYSICIAN:  Reyne Dumas, MD       DATE OF BIRTH:  1940-11-23   DATE OF ADMISSION:  04/17/2008  DATE OF DISCHARGE:  04/20/2008                               DISCHARGE SUMMARY   DISCHARGE DIAGNOSES:  1. Acute renal insufficiency secondary to ACE inhibitor and      nonsteroidals.  2. Anemia of acute illness.  3. Hypotension resolved with intravenous hydration.  4. Hypothyroidism.  5. Dyslipidemia.   SUBJECTIVE:  This is a 70 year old female with a history of hypertension  treated with lisinopril, degenerative joint disease treated with  ibuprofen and Celebrex, hypothyroidism for about 40 years, history of  GERD and dyslipidemia, who was sent to the ER from Urgent Care because  of abnormal labs.  The patient had been having abdominal pain and some  diarrhea.  She went to Urgent Care on April 12, 2008, diagnosed with  possible colitis, placed on ciprofloxacin and Flagyl which caused her to  have decreased oral intake and nausea.  Subsequently saw Dr. Collene Mares who  did some routine labs and found her to have acute renal failure.  The  patient denied any urinary urgency, frequency or dysuria at the time of  her presentation.  She was found to be initially hypotensive, but  subsequently developed hypotension with systolic blood pressure in the  80s.  She was found to have abnormal labs with an elevated white count  of 15.4, hemoglobin of 11.7, hematocrit of 35.5, creatinine of 12.62 and  BUN of 108.  Her FENa was calculated to be about 13 greater than 1%.  The patient was seen by Dr. Jeneen Rinks Deterding.  As per his assessment, the  patient probably had acute kidney injury because of nonsteroidals and  ACE inhibitor.  The possibility of acute interstitial disease secondary  to ciprofloxacin was entertained,  however, her urinalysis was not  consistent with it.  She had a renal ultrasound that did not show any  obstructive uropathy.  The patient's creatinine continued to improve  with IV hydration.  She was found to have excellent urine output.  The  creatinine was down to 4.62 on the day of discharge.  The patient was  found to have a potassium of 2.8 despite aggressive potassium repletion.  She has been provided with potassium supplementation.  She has been  advised to avoid ACE inhibitors, NSAIDs and Celebrex, and take Tylenol  with codeine as needed for pain.   Possible colitis.  The patient ciprofloxacin and Flagyl were  discontinued essentially because the patient's symptoms had completely  resolved and she tolerated oral diet.   FOLLOW UP CONCERNS:  1. She is recommended to have a repeat BMET in 5 days.  She has been      advised not to take lisinopril  NSAIDs and Celebrex.  2. She is advised to be on a regular diet with plenty of fluids.  3. She is to  follow up at [facility] in 5-7 days.  She      is to have her BMET and CBC in 5 days prior to her appointment.  4. She is also provided with a list of PCP providers accepting new      patients.   DISCHARGE MEDICATIONS:  1. K-Dur 40 mEq p.o. q.12 h. for 3 days.  2. Tylenol with codeine #1 q.6 h. p.o. p.r.n. pain.  3. Allegra 180 mg p.o. daily.  4. Astelin nasal spray p.r.n.  5. Synthroid 100 mcg p.o. daily.  6. Omeprazole 20 mg p.o. daily.  7. Simvastatin 20 mg daily.  8. Proventil MDI p.r.n.      Reyne Dumas, MD  Electronically Signed     NA/MEDQ  D:  04/20/2008  T:  04/20/2008  Job:  [id number]

## 2010-08-25 NOTE — Consult Note (Signed)
NEW PATIENT CONSULTATION   [name], [name]  DOB:  1941/01/24                                       09/29/2009  FOYDX#:[id number]   The patient is a 70 year old female referred by Dr. Philip Aspen for  subclavian occlusive disease.  She denies any history of ischemia  symptoms of the right arm including nonhealing ulcers, gangrene or  claudication.  She also denies any neurologic symptoms such as diplopia,  blurred vision, syncope, hemispheric or non-hemispheric TIAs, amaurosis  fugax, etc.  She had a carotid duplex study performed by [facility]  on September 12, 2009.  I have reviewed this report.  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  She also has some mild bilateral carotid occlusive  disease.   CHRONIC MEDICAL PROBLEMS:  1. Hypertension.  2. GERD.  3. Allergic rhinitis.  4. Hypothyroidism.  5. Bilateral cataracts status post surgery.  6. Negative for coronary artery disease, diabetes or hyperlipidemia.   SOCIAL HISTORY:  She is single.  She has 3 children.  Is retired.  Quit  smoking 30 years ago.  Does not use alcohol.   FAMILY HISTORY:  Negative for coronary artery disease, diabetes and  stroke.   REVIEW OF SYSTEMS:  Denies any weight loss, anorexia, chest pain,  dyspnea on exertion, PND, orthopnea.  Able to ambulate about a mile  without discomfort.  Does have arthritis joint pain and muscle pain.  No  pulmonary problems such as asthma, wheezing, bronchitis.  Denies any  other symptoms in the review of systems.   PHYSICAL EXAMINATION:  Blood pressure 157/76 in the left arm, 78/60 in  the right arm, heart rate 84, respirations 16.  General:  Well-  developed, well-nourished female in no apparent distress, alert and  oriented x3.  HEENT:  Exam is normal.  EOMs intact.  Lungs:  Clear to  auscultation.  No rhonchi or wheezing.  Cardiovascular:  Regular rhythm  with no murmurs.  There  are bruits bilaterally in the neck, right more  harsh than the left.  Abdomen:  Soft, nontender with no masses.  Musculoskeletal:  Free of major deformities.  Neurologic:  Normal.  Skin:  Free of rashes.  Lower extremity exam reveals 3+ femoral,  popliteal and dorsalis pedis pulses palpable bilaterally.  Upper  extremity exam reveals 3+ brachial and radial pulse on the left, absent  brachial and radial pulse on the right.  Right hand is free of any  evidence of ischemia, with no ulceration, discoloration or gangrene.   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   Although the patient does have retrograde flow in her right vertebral  artery, there are no neurologic symptoms and no claudication symptoms in  the right arm; therefore, this does not need to be treated.  If she  develops ischemic symptoms in the right arm such as claudication or  ischemic digits or should develop any neurologic symptoms like  hemiparesis, diplopia, blurred vision or syncope, then would need to see  her again to consider treatment.  At this point it is asymptomatic and  no treatment is necessary.  I did recommend her taking enteric-coated  aspirin for this and have given her a prescription for this.     Nelda Severe Kellie Simmering, M.D.  Electronically  Signed   JDL/MEDQ  D:  09/29/2009  T:  09/30/2009  Job:  [id number]   cc:   Ermalene Searing. Philip Aspen, M.D.

## 2010-08-25 NOTE — Procedures (Signed)
CAROTID DUPLEX EXAM   INDICATION:  Right subclavian stenosis.   HISTORY:  Diabetes:  No.  Cardiac:  No.  Hypertension:  Yes.  Smoking:  Previously.  Previous Surgery:  No.  CV History:  Asymptomatic.  Amaurosis Fugax No, Paresthesias No, Hemiparesis No                                       RIGHT             LEFT  Brachial systolic pressure:         113               192  Brachial Doppler waveforms:         Abnormal          Normal  Vertebral direction of flow:        Retrograde        Antegrade  DUPLEX VELOCITIES (cm/sec)  CCA peak systolic                   255               90  ECA peak systolic                   131               161  ICA peak systolic                   95                73  ICA end diastolic                   24                18  PLAQUE MORPHOLOGY:                  Calcific          Calcific  PLAQUE AMOUNT:                      Moderate          Mild  PLAQUE LOCATION:                    Proximal CCA      CCA/ICA   IMPRESSION:  1. Right common carotid artery shows increased velocities in proximal      portion of common carotid artery.  2. Retrograde flow noted in the right vertebral artery with monophasic      flow noted in the right subclavian artery.  3. Bilateral ICA suggests 1-39% stenosis.  4. Antegrade flow noted in left vertebral artery.  Triphasic flow      noted in left subclavian artery.   ___________________________________________  Nelda Severe Kellie Simmering, M.D.   NT/MEDQ  D:  12/02/2009  T:  12/02/2009  Job:  [id number]

## 2010-08-28 NOTE — Op Note (Signed)
[name], [name]               ACCOUNT NO.:  [id number]   MEDICAL RECORD NO.:  [id number]          PATIENT TYPE:  AMB   LOCATION:  ENDO                         FACILITY:  [facility]   PHYSICIAN:  Nelwyn Salisbury, M.D.  DATE OF BIRTH:  1941-03-21   DATE OF PROCEDURE:  03/16/2004  DATE OF DISCHARGE:                                 OPERATIVE REPORT   PROCEDURE PERFORMED:  Colonoscopy with snare polypectomy times three and  cold biopsies times two.   ENDOSCOPIST:  Juanita Craver, M.D.   INSTRUMENT USED:  Olympus video colonoscope.   INDICATIONS FOR PROCEDURE:  The patient is a 70 year old African-American  female undergoing screening colonoscopy to rule out colonic polyps, masses,  etc.   PREPROCEDURE PREPARATION:  Informed consent was procured from the patient.  The patient was fasted for eight hours prior to the procedure and prepped  with a bottle of magnesium citrate and a gallon of GoLYTELY the night prior  to the procedure.       The risks and benefits of the procedure including a  10% miss rate for colon polyps or cancers was discussed with the patient.   PREPROCEDURE PHYSICAL:  The patient had stable vital signs.  Neck supple.  Chest clear to auscultation.  S1 and S2 regular.  Abdomen soft with normal  bowel sounds.   DESCRIPTION OF PROCEDURE:  The patient was placed in left lateral decubitus  position and sedated with 50 mg of Demerol and 4 mg of Versed in slow  incremental doses.  Once the patient was adequately sedated and maintained  on low flow oxygen and continuous cardiac monitoring, the Olympus video  colonoscope was advanced from the rectum to the cecum.  The appendicular  orifice and ileocecal valve were clearly visualized and photographed.  The  patient had a somewhat difficult exam.  The patient's position was changed  from the left lateral to the supine position with gentle application of  abdominal pressure to reach the cecum.  The terminal ileum appeared  healthy  and without lesions.  A small sessile polyp was snared form the cecum close  to the ileocecal valve.  Another polyp was snared from 50 cm times two.  Another small erosion was biopsied (cold biopsies) at 20 cm.  Small internal  hemorrhoids were seen on retroflexion.  The patient tolerated the procedure  well without complication.   IMPRESSION:  1.  Two polyps snared, one from the cecum, one from 50 cm.  2.  Small erosion biopsied from 20 cm.  3.  Small internal hemorrhoids.  4.  No evidence of diverticulosis.  5.  Normal terminal ileum.   RECOMMENDATIONS:  1.  Await pathology results.  2.  Avoid all nonsteroidals including aspirin for the next four weeks.  3.  Outpatient followup in the next two weeks for further recommendations.      Jyot   JNM/MEDQ  D:  03/16/2004  T:  03/16/2004  Job:  [id number]   cc:   Lucianne Lei, M.D.  [facility] [address], [address]  [place]  [place] [address]  Fax: [phone]

## 2011-01-05 ENCOUNTER — Other Ambulatory Visit: Payer: Self-pay | Admitting: Internal Medicine

## 2011-01-05 DIAGNOSIS — Z1231 Encounter for screening mammogram for malignant neoplasm of breast: Secondary | ICD-10-CM

## 2011-02-15 ENCOUNTER — Ambulatory Visit (HOSPITAL_COMMUNITY)
Admission: RE | Admit: 2011-02-15 | Discharge: 2011-02-15 | Disposition: A | Discharge status: Home / Self Care | Payer: Medicare Other | Source: Ambulatory Visit | Attending: Internal Medicine | Admitting: Internal Medicine

## 2011-02-15 DIAGNOSIS — Z1231 Encounter for screening mammogram for malignant neoplasm of breast: Secondary | ICD-10-CM

## 2011-03-22 ENCOUNTER — Encounter: Payer: Self-pay | Admitting: Vascular Surgery

## 2011-04-12 ENCOUNTER — Ambulatory Visit: Payer: Medicare Other | Admitting: Vascular Surgery

## 2011-04-12 ENCOUNTER — Other Ambulatory Visit: Payer: Medicare Other

## 2011-04-26 ENCOUNTER — Encounter: Payer: Self-pay | Admitting: Vascular Surgery

## 2011-04-27 ENCOUNTER — Ambulatory Visit (INDEPENDENT_AMBULATORY_CARE_PROVIDER_SITE_OTHER): Payer: Medicare Other | Admitting: Vascular Surgery

## 2011-04-27 ENCOUNTER — Encounter: Payer: Self-pay | Admitting: Vascular Surgery

## 2011-04-27 ENCOUNTER — Ambulatory Visit (INDEPENDENT_AMBULATORY_CARE_PROVIDER_SITE_OTHER): Payer: Medicare Other | Admitting: *Deleted

## 2011-04-27 VITALS — BP 150/48 | HR 74 | Resp 20 | Ht 63.0 in | Wt 141.0 lb

## 2011-04-27 DIAGNOSIS — I6529 Occlusion and stenosis of unspecified carotid artery: Secondary | ICD-10-CM

## 2011-04-27 DIAGNOSIS — Z48812 Encounter for surgical aftercare following surgery on the circulatory system: Secondary | ICD-10-CM

## 2011-04-27 NOTE — Progress Notes (Signed)
Subjective:     Patient ID: [name], female   DOB: 10/19/1940, 71 y.o.   MRN: [id number]  HPI this 71-year-old female underwent right subclavian-carotid anastomosis 4 total occlusion of the right subclavian artery and right arm claudication symptoms and possible posterior circulation TIAs in September of 2011. Right arm has remained asymptomatic. She does occasionally  have some blurred vision in the right eye. She denies any lateralizing weakness, aphasia, temporary blindness, or other neurologic he has been noted to have elevated velocity at her carotid subclavian anastomosis one year ago with a 20 mm gradient between arms. Preoperatively this was 80 mm.  Past Medical History  Diagnosis Date  . Hypertension   . Arthritis   . Joint pain   . Allergy   . GERD (gastroesophageal reflux disease)   . Thyroid disease     History  Substance Use Topics  . Smoking status: Former Smoker -- 0.3 packs/day for 5 years    Types: Cigarettes    Quit date: 04/12/1980  . Smokeless tobacco: Never Used  . Alcohol Use: No    Family History  Problem Relation Age of Onset  . Heart failure Mother   . Cancer Father     No Known Allergies  Current outpatient prescriptions:acetaminophen (TYLENOL) 325 MG tablet, Take 650 mg by mouth every 6 (six) hours as needed.  , Disp: , Rfl: ;  aspirin EC 81 MG tablet, Take 81 mg by mouth daily.  , Disp: , Rfl: ;  Cetirizine HCl (ZYRTEC PO), Take by mouth.  , Disp: , Rfl: ;  levothyroxine (SYNTHROID, LEVOTHROID) 125 MCG tablet, Take 50 mcg by mouth daily.  , Disp: , Rfl:  lisinopril (PRINIVIL,ZESTRIL) 10 MG tablet, Take 10 mg by mouth daily.  , Disp: , Rfl: ;  metoprolol succinate (TOPROL-XL) 25 MG 24 hr tablet, Take 25 mg by mouth daily.  , Disp: , Rfl:   BP 150/48  Pulse 74  Resp 20  Ht 5' 3" (1.6 m)  Wt 141 lb (63.957 kg)  BMI 24.98 kg/m2  Body mass index is 24.98 kg/(m^2).         Review of Systems she denies chest pain, dyspnea on exertion,  PND, orthopnea, hemoptysis, lower extremity claudication, difficulty swallowing, or any other specific symptoms in the complete review of     Objective:   Physical Exam blood pressure 85/60 in the right arm 120/80 in the left arm heart rate 74 respirations 20 General well-developed well-nourished female in no apparent distress alert and oriented x3 Lungs no rhonchi or wheezing Cardiovascular regular rhythm no murmurs carotid pulses 3+. There is a harsh bruit in the right supraclavicular area. Soft bruit is present on the left. Neurologic normal Abdomen soft nontender with no masses Lower extremity 3+ femoral pulses bilaterally with well-perfused lower extremities Musculoskeletal free major deformities Skin free of rashes  Today I ordered a duplex scan of her right carotid subclavian anastomosis and both carotid arteries. I reviewed and interpreted this. She does have a velocity of 537 cm/s at the subclavian carotid anastomosis site in her proximal common carotid artery on the right. She had a known stenosis of her proximal common carotid artery at the time of surgery which is not severe. She has mild left internal carotid occlusive disease.     Assessment:     Possible stenosis at carotid subclavian anastomosis or proximal right common carotid artery with increasing gradient between right and left arms-asymptomatic    Plan:       Arch aortogram with right innominate angiogram to look at common carotid artery and carotid subclavian anastomosis . Scheduled for Tuesday, January 22 by Dr. Trula Slade

## 2011-04-28 ENCOUNTER — Encounter (HOSPITAL_COMMUNITY): Payer: Self-pay | Admitting: Pharmacy Technician

## 2011-04-30 ENCOUNTER — Other Ambulatory Visit: Payer: Self-pay

## 2011-05-04 ENCOUNTER — Other Ambulatory Visit: Payer: Self-pay | Admitting: *Deleted

## 2011-05-04 ENCOUNTER — Encounter (HOSPITAL_COMMUNITY)
Admission: RE | Disposition: A | Discharge status: Home / Self Care | Payer: Self-pay | Source: Ambulatory Visit | Attending: Surgery

## 2011-05-04 ENCOUNTER — Ambulatory Visit (HOSPITAL_COMMUNITY)
Admission: RE | Admit: 2011-05-04 | Discharge: 2011-05-04 | Disposition: A | Discharge status: Home / Self Care | Payer: Medicare Other | Source: Ambulatory Visit | Attending: Surgery | Admitting: Surgery

## 2011-05-04 ENCOUNTER — Other Ambulatory Visit: Payer: Self-pay

## 2011-05-04 DIAGNOSIS — I6529 Occlusion and stenosis of unspecified carotid artery: Secondary | ICD-10-CM

## 2011-05-04 DIAGNOSIS — Z9889 Other specified postprocedural states: Secondary | ICD-10-CM | POA: Insufficient documentation

## 2011-05-04 DIAGNOSIS — I658 Occlusion and stenosis of other precerebral arteries: Secondary | ICD-10-CM | POA: Insufficient documentation

## 2011-05-04 HISTORY — PX: ARCH AORTOGRAM: SHX5501

## 2011-05-04 HISTORY — PX: CAROTID ANGIOGRAM: SHX5504

## 2011-05-04 LAB — POCT I-STAT, CHEM 8
Chloride: 110 mEq/L (ref 96–112)
Glucose, Bld: 120 mg/dL — ABNORMAL HIGH (ref 70–99)
HCT: 38 % (ref 36.0–46.0)
Hemoglobin: 12.9 g/dL (ref 12.0–15.0)
Potassium: 3.8 mEq/L (ref 3.5–5.1)
Sodium: 143 mEq/L (ref 135–145)

## 2011-05-04 SURGERY — ARCH AORTOGRAM
Anesthesia: LOCAL

## 2011-05-04 MED ORDER — HEPARIN (PORCINE) IN NACL 2-0.9 UNIT/ML-% IJ SOLN
INTRAMUSCULAR | Status: AC
  Filled 2011-05-04: qty 1000

## 2011-05-04 MED ORDER — LIDOCAINE HCL (PF) 1 % IJ SOLN
INTRAMUSCULAR | Status: AC
  Filled 2011-05-04: qty 30

## 2011-05-04 MED ORDER — SODIUM CHLORIDE 0.9 % IV SOLN
INTRAVENOUS | Status: DC
  Administered 2011-05-04: 08:00:00 via INTRAVENOUS

## 2011-05-04 NOTE — Op Note (Signed)
[facility]  Patient name: [name] MRN: [id number] DOB: 09-21-1940 Sex: female  05/04/2011 Pre-operative Diagnosis: Right carotid to subclavian transposition Post-operative diagnosis:  Same Surgeon:  Eldridge Abrahams Procedure Performed:  1.  ultrasound access right femoral artery  2.  aortic arch angiogram  3.  first-order catheterization (right common carotid artery)  4.  right carotid angiogram     Indications:   the patient is status post right subclavian to carotid transposition for symptomatic right subclavian occlusion. She was found to have elevated velocities in her common carotid artery by ultrasound she comes in today for further evaluation.  Procedure:  ultrasound was used to evaluate the right common femoral artery. A digital ultrasound image was acquired. The right common femoral artery was then accessed under ultrasound guidance with an 18-gauge needle. An 035 wire was advanced into the aorta under fluoroscopic visualization. A 5 French sheath was placed. Over the wire a pigtail catheter was advanced into the descending aorta and an aortic arch and exam was performed. 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 The proximal innominate artery has an area of calcific disease. The right subclavian to carotid transposition is widely patent. There is slight progression of the proximal left common carotid artery stenosis. It now measures approximately 70%. The left subclavian artery is widely patent.  Right carotid artery: Approximately 80% stenosis is identified in the proximal right common carotid artery. The right subclavian to carotid transposition anastomosis is widely patent.  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 Annamarie Major, M.D. [facility] Office: [phone] Pager:  [phone]

## 2011-05-04 NOTE — Interval H&P Note (Signed)
History and Physical Interval Note:  05/04/2011 8:24 AM  [name]  has presented today for surgery, with the diagnosis of Possible stenosis   The various methods of treatment have been discussed with the patient and family. After consideration of risks, benefits and other options for treatment, the patient has consented to  Procedure(s): ARCH AORTOGRAM CAROTID ANGIOGRAM as a surgical intervention .  The patients' history has been reviewed, patient examined, no change in status, stable for surgery.  I have reviewed the patients' chart and labs.  Questions were answered to the patient's satisfaction.     Jadee Golebiewski IV, V. WELLS

## 2011-05-04 NOTE — H&P (View-Only) (Signed)
Subjective:     Patient ID: [name], female   DOB: [birth year], 71 y.o.   MRN: [id number]  HPI this 71 year old female underwent right subclavian-carotid anastomosis 4 total occlusion of the right subclavian artery and right arm claudication symptoms and possible posterior circulation TIAs in September of 2011. Right arm has remained asymptomatic. She does occasionally  have some blurred vision in the right eye. She denies any lateralizing weakness, aphasia, temporary blindness, or other neurologic he has been noted to have elevated velocity at her carotid subclavian anastomosis one year ago with a 20 mm gradient between arms. Preoperatively this was 80 mm.  Past Medical History  Diagnosis Date  . Hypertension   . Arthritis   . Joint pain   . Allergy   . GERD (gastroesophageal reflux disease)   . Thyroid disease     History  Substance Use Topics  . Smoking status: Former Smoker -- 0.3 packs/day for 5 years    Types: Cigarettes    Quit date: 04/12/1980  . Smokeless tobacco: Never Used  . Alcohol Use: No    Family History  Problem Relation Age of Onset  . Heart failure Mother   . Cancer Father     No Known Allergies  Current outpatient prescriptions:acetaminophen (TYLENOL) 325 MG tablet, Take 650 mg by mouth every 6 (six) hours as needed.  , Disp: , Rfl: ;  aspirin EC 81 MG tablet, Take 81 mg by mouth daily.  , Disp: , Rfl: ;  Cetirizine HCl (ZYRTEC PO), Take by mouth.  , Disp: , Rfl: ;  levothyroxine (SYNTHROID, LEVOTHROID) 125 MCG tablet, Take 50 mcg by mouth daily.  , Disp: , Rfl:  lisinopril (PRINIVIL,ZESTRIL) 10 MG tablet, Take 10 mg by mouth daily.  , Disp: , Rfl: ;  metoprolol succinate (TOPROL-XL) 25 MG 24 hr tablet, Take 25 mg by mouth daily.  , Disp: , Rfl:   BP 150/48  Pulse 74  Resp 20  Ht _0  (1.6 m)  Wt 141 lb (63.957 kg)  BMI 24.98 kg/m2  Body mass index is 24.98 kg/(m^2).         Review of Systems she denies chest pain, dyspnea on exertion,  PND, orthopnea, hemoptysis, lower extremity claudication, difficulty swallowing, or any other specific symptoms in the complete review of     Objective:   Physical Exam blood pressure 85/60 in the right arm 120/80 in the left arm heart rate 74 respirations 20 General well-developed well-nourished female in no apparent distress alert and oriented x3 Lungs no rhonchi or wheezing Cardiovascular regular rhythm no murmurs carotid pulses 3+. There is a harsh bruit in the right supraclavicular area. Soft bruit is present on the left. Neurologic normal Abdomen soft nontender with no masses Lower extremity 3+ femoral pulses bilaterally with well-perfused lower extremities Musculoskeletal free major deformities Skin free of rashes  Today I ordered a duplex scan of her right carotid subclavian anastomosis and both carotid arteries. I reviewed and interpreted this. She does have a velocity of 537 cm/s at the subclavian carotid anastomosis site in her proximal common carotid artery on the right. She had a known stenosis of her proximal common carotid artery at the time of surgery which is not severe. She has mild left internal carotid occlusive disease.     Assessment:     Possible stenosis at carotid subclavian anastomosis or proximal right common carotid artery with increasing gradient between right and left arms-asymptomatic    Plan:  Arch aortogram with right innominate angiogram to look at common carotid artery and carotid subclavian anastomosis . Scheduled for Tuesday, January 22 by Dr. Trula Slade

## 2011-05-04 NOTE — Procedures (Unsigned)
CAROTID DUPLEX EXAM  INDICATION:  Follow up carotid stenosis.  HISTORY: Diabetes:  No Cardiac:  No Hypertension:  Yes Smoking:  Previous Previous Surgery:  Right CCA to subclavian bypass September 2011 CV History:  Currently asymptomatic Amaurosis Fugax No, Paresthesias No, Hemiparesis No                                      RIGHT             LEFT Brachial systolic pressure:         98                138 Brachial Doppler waveforms:         biphasic          normal Vertebral direction of flow:        not visualized    antegrade DUPLEX VELOCITIES (cm/sec) CCA peak systolic                   74                330 ECA peak systolic                   76                076 ICA peak systolic                   36                226 ICA end diastolic                   15                29 PLAQUE MORPHOLOGY:                  heterogeneous     heterogeneous PLAQUE AMOUNT:                      minimal           minimal PLAQUE LOCATION:                    CCA, ICA          CCA, ICA, ECA  IMPRESSION: 1. Right internal carotid artery velocities suggest 1% to 39% stenosis     and appear diminished in comparison to the last exam. 2. Left internal carotid artery velocities suggest 1% to 39% stenosis. 3. Left external carotid artery stenosis. 4. Elevated velocities suggestive of hemodynamically significant     stenosis involving the right subclavian to common carotid artery     bypass with a peak systolic velocity of 333 cm/second.  ___________________________________________ Nelda Severe. Kellie Simmering, M.D.  EM/MEDQ  D:  04/28/2011  T:  04/28/2011  Job:  [id number]

## 2011-05-10 MED ORDER — SODIUM CHLORIDE 0.9 % IV SOLN
INTRAVENOUS | Status: DC
  Administered 2011-05-11: 06:00:00 via INTRAVENOUS

## 2011-05-11 ENCOUNTER — Inpatient Hospital Stay (HOSPITAL_COMMUNITY)
Admission: RE | Admit: 2011-05-11 | Discharge: 2011-05-12 | DRG: 035 | Disposition: A | Discharge status: Home / Self Care | Payer: Medicare Other | Source: Ambulatory Visit | Attending: Surgery | Admitting: Surgery

## 2011-05-11 ENCOUNTER — Encounter (HOSPITAL_COMMUNITY)
Admission: RE | Disposition: A | Discharge status: Home / Self Care | Payer: Self-pay | Source: Ambulatory Visit | Attending: Surgery

## 2011-05-11 DIAGNOSIS — I6529 Occlusion and stenosis of unspecified carotid artery: Principal | ICD-10-CM

## 2011-05-11 DIAGNOSIS — Z01812 Encounter for preprocedural laboratory examination: Secondary | ICD-10-CM

## 2011-05-11 DIAGNOSIS — Z8673 Personal history of transient ischemic attack (TIA), and cerebral infarction without residual deficits: Secondary | ICD-10-CM

## 2011-05-11 DIAGNOSIS — Z7982 Long term (current) use of aspirin: Secondary | ICD-10-CM

## 2011-05-11 DIAGNOSIS — Z87891 Personal history of nicotine dependence: Secondary | ICD-10-CM

## 2011-05-11 DIAGNOSIS — D62 Acute posthemorrhagic anemia: Secondary | ICD-10-CM | POA: Diagnosis not present

## 2011-05-11 HISTORY — PX: OTHER SURGICAL HISTORY: SHX169

## 2011-05-11 HISTORY — PX: CAROTID STENT INSERTION: SHX5505

## 2011-05-11 LAB — POCT ACTIVATED CLOTTING TIME: Activated Clotting Time: 430 seconds

## 2011-05-11 SURGERY — CAROTID STENT INSERTION
Anesthesia: LOCAL | Laterality: Right

## 2011-05-11 MED ORDER — ONDANSETRON HCL 4 MG/2ML IJ SOLN: 4.0000 mg | Freq: Four times a day (QID) | INTRAMUSCULAR | Status: DC | PRN

## 2011-05-11 MED ORDER — LISINOPRIL 10 MG PO TABS
10.0000 mg | ORAL_TABLET | Freq: Every day | ORAL | Status: DC
  Administered 2011-05-12: 10 mg via ORAL
  Filled 2011-05-11 (×2): qty 1

## 2011-05-11 MED ORDER — ACETAMINOPHEN 325 MG PO TABS: 325.0000 mg | ORAL_TABLET | Freq: Four times a day (QID) | ORAL | Status: DC | PRN

## 2011-05-11 MED ORDER — BIVALIRUDIN 250 MG IV SOLR
INTRAVENOUS | Status: AC
  Filled 2011-05-11: qty 250

## 2011-05-11 MED ORDER — ATROPINE SULFATE 1 MG/ML IJ SOLN
INTRAMUSCULAR | Status: AC
  Filled 2011-05-11: qty 1

## 2011-05-11 MED ORDER — LEVOTHYROXINE SODIUM 75 MCG PO TABS
75.0000 ug | ORAL_TABLET | Freq: Every day | ORAL | Status: DC
  Administered 2011-05-12: 75 ug via ORAL
  Filled 2011-05-11 (×2): qty 1

## 2011-05-11 MED ORDER — GUAIFENESIN ER 600 MG PO TB12
600.0000 mg | ORAL_TABLET | Freq: Two times a day (BID) | ORAL | Status: DC | PRN
  Filled 2011-05-11: qty 1

## 2011-05-11 MED ORDER — HEPARIN SODIUM (PORCINE) 1000 UNIT/ML IJ SOLN
INTRAMUSCULAR | Status: AC
  Filled 2011-05-11: qty 1

## 2011-05-11 MED ORDER — ASPIRIN 81 MG PO CHEW
81.0000 mg | CHEWABLE_TABLET | Freq: Every day | ORAL | Status: DC
  Administered 2011-05-12: 81 mg via ORAL
  Filled 2011-05-11: qty 1

## 2011-05-11 MED ORDER — DEXTROSE 5 % IV SOLN
INTRAVENOUS | Status: AC
  Filled 2011-05-11: qty 250

## 2011-05-11 MED ORDER — SODIUM CHLORIDE 0.9 % IV SOLN: 1.0000 mL/kg/h | INTRAVENOUS | Status: AC

## 2011-05-11 MED ORDER — METOPROLOL SUCCINATE 12.5 MG HALF TABLET
12.5000 mg | ORAL_TABLET | Freq: Every day | ORAL | Status: DC
  Administered 2011-05-12: 12.5 mg via ORAL
  Filled 2011-05-11: qty 1

## 2011-05-11 MED ORDER — NOREPINEPHRINE BITARTRATE 1 MG/ML IJ SOLN
INTRAMUSCULAR | Status: AC
  Filled 2011-05-11: qty 4

## 2011-05-11 MED ORDER — LORATADINE 10 MG PO TABS
10.0000 mg | ORAL_TABLET | Freq: Every day | ORAL | Status: DC
  Administered 2011-05-12: 10 mg via ORAL
  Filled 2011-05-11 (×2): qty 1

## 2011-05-11 MED ORDER — ASPIRIN 81 MG PO CHEW
CHEWABLE_TABLET | ORAL | Status: AC
  Filled 2011-05-11: qty 4

## 2011-05-11 MED ORDER — ACETAMINOPHEN 325 MG PO TABS: 650.0000 mg | ORAL_TABLET | ORAL | Status: DC | PRN

## 2011-05-11 MED ORDER — CLOPIDOGREL BISULFATE 75 MG PO TABS
75.0000 mg | ORAL_TABLET | Freq: Every day | ORAL | Status: DC
  Administered 2011-05-12: 75 mg via ORAL
  Filled 2011-05-11 (×2): qty 1

## 2011-05-11 MED ORDER — LIDOCAINE HCL (PF) 1 % IJ SOLN
INTRAMUSCULAR | Status: AC
  Filled 2011-05-11: qty 30

## 2011-05-11 MED ORDER — OXYCODONE-ACETAMINOPHEN 5-325 MG PO TABS: 1.0000 | ORAL_TABLET | ORAL | Status: DC | PRN

## 2011-05-11 MED ORDER — HEPARIN (PORCINE) IN NACL 2-0.9 UNIT/ML-% IJ SOLN
INTRAMUSCULAR | Status: AC
  Filled 2011-05-11: qty 1000

## 2011-05-11 MED ORDER — CIPROFLOXACIN HCL 500 MG PO TABS
500.0000 mg | ORAL_TABLET | Freq: Every day | ORAL | Status: DC
  Administered 2011-05-11 – 2011-05-12 (×2): 500 mg via ORAL
  Filled 2011-05-11 (×2): qty 1

## 2011-05-11 MED ORDER — HYDRALAZINE HCL 20 MG/ML IJ SOLN
10.0000 mg | INTRAMUSCULAR | Status: DC | PRN
  Filled 2011-05-11: qty 0.5

## 2011-05-11 NOTE — Op Note (Signed)
[facility] [place]  Patient name: [name] MRN: [id number] DOB: Sep 07, 1940 Sex: female  05/11/2011 Pre-operative Diagnosis: Right carotid stenosis Post-operative diagnosis:  Same Surgeon:  Eldridge Abrahams Procedure Performed:  1.  ultrasound access right brachial artery  2.  right carotid angiogram  3.  right common carotid stent (9 x 30)  4.  second order catheterization     Indications:  The patient is status post right carotid subclavian transposition secondary to neurologic symptoms. Duplex ultrasound revealed a high-grade stenosis proximal to the anastomosis. This was confirmed with angiography. The patient comes in today for stenting. The risks and benefits were discussed with the patient which include the risk of bleeding risk of stroke.  Procedure:  The patient was identified in the holding area and taken to room 8. She was placed supine on the table. The right arm was prepped and draped in usual fashion. A timeout was called. The right brachial artery was evaluated with ultrasound and found to be patent and compressible. A digital ultrasound image was acquired. The right brachial artery was then accessed under ultrasound guidance with a micropuncture needle. An 018 wire was advanced without resistance and a micropuncture sheath was placed. Next the 35 wire was advanced into the proximal subclavian artery. This was followed by a 6 French 55 cm antral 1 sheath. 2000 units of heparin and 400 mcg of nitroglycerin were administered sheeth. At this point in time an Angiomax bolus and drip were begun. I waited until the ACT was greater than 300. Next using a 035 Wholey wire and an end hole catheter the wire was advanced into a descending aortic arch. This was followed by the 4 [place] catheter. Next a Amplatz superstiff wire was advanced to the catheter. We elected to primarily stent this area. A 9 x 30 Absolute Pro was fairly back table. Contrast injections were  performed to the sheath which was in the proximal subclavian artery. Additional images were also acquired through a catheter which was in the proximal innominate artery. Once the desired location for the stent was selected the stent was advanced over the wire into position. It was then successfully deployed. The stent was then molded to confirmation using a 6 x 2 balloon taken to nominal pressure which was 8 atmospheres. A completion arteriogram was performed which showed significant improvement within the stenosis. We failed to get wall apposition on the distal portion of the stent however I did not feel that additional balloon molding would be beneficial and potentially could be harmful. I also did not feel like an additional stent would be advantageous in this situation. The stenosis was significantly improved and therefore elected to leave the existing results. At this point wires and catheters were removed the long 6 French sheath was exchanged out for a 6 [place] sheath. The patient tolerated the procedure well there no complications  Impression:  #1  successful right common carotid artery stenting using a 9 x 30 self-expanding stent    V. Annamarie Major, M.D. [facility] [place] Office: [phone] Pager:  [phone]

## 2011-05-11 NOTE — H&P (View-Only) (Signed)
Subjective:     Patient ID: [name], female   DOB: 04/21/1940, 71 y.o.   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 Right arm has remained asymptomatic. She does occasionally  have some blurred vision in the right eye. She denies any lateralizing weakness, aphasia, temporary blindness, or other neurologic he has been noted to have elevated velocity at her carotid subclavian anastomosis one year ago with a 20 mm gradient between arms. Preoperatively this was 80 mm.  Past Medical History  Diagnosis Date  . Hypertension   . Arthritis   . Joint pain   . Allergy   . GERD (gastroesophageal reflux disease)   . Thyroid disease     History  Substance Use Topics  . Smoking status: Former Smoker -- 0.3 packs/day for 5 years    Types: Cigarettes    Quit date: 04/12/1980  . Smokeless tobacco: Never Used  . Alcohol Use: No    Family History  Problem Relation Age of Onset  . Heart failure Mother   . Cancer Father     No Known Allergies  Current outpatient prescriptions:acetaminophen (TYLENOL) 325 MG tablet, Take 650 mg by mouth every 6 (six) hours as needed.  , Disp: , Rfl: ;  aspirin EC 81 MG tablet, Take 81 mg by mouth daily.  , Disp: , Rfl: ;  Cetirizine HCl (ZYRTEC PO), Take by mouth.  , Disp: , Rfl: ;  levothyroxine (SYNTHROID, LEVOTHROID) 125 MCG tablet, Take 50 mcg by mouth daily.  , Disp: , Rfl:  lisinopril (PRINIVIL,ZESTRIL) 10 MG tablet, Take 10 mg by mouth daily.  , Disp: , Rfl: ;  metoprolol succinate (TOPROL-XL) 25 MG 24 hr tablet, Take 25 mg by mouth daily.  , Disp: , Rfl:   BP 150/48  Pulse 74  Resp 20  Ht 5' 3" (1.6 m)  Wt 141 lb (63.957 kg)  BMI 24.98 kg/m2  Body mass index is 24.98 kg/(m^2).         Review of Systems she denies chest pain, dyspnea on exertion,  PND, orthopnea, hemoptysis, lower extremity claudication, difficulty swallowing, or any other specific symptoms in the complete review of     Objective:   Physical Exam blood pressure 85/60 in the right arm 120/80 in the left arm heart rate 74 respirations 20 General well-developed well-nourished female in no apparent distress alert and oriented x3 Lungs no rhonchi or wheezing Cardiovascular regular rhythm no murmurs carotid pulses 3+. There is a harsh bruit in the right supraclavicular area. Soft bruit is present on the left. Neurologic normal Abdomen soft nontender with no masses Lower extremity 3+ femoral pulses bilaterally with well-perfused lower extremities Musculoskeletal free major deformities Skin free of rashes  Today I ordered a duplex scan of her right carotid subclavian anastomosis and both carotid arteries. I reviewed and interpreted this. She does have a velocity of 537 cm/s at the subclavian carotid anastomosis site in her proximal common carotid artery on the right. She had a known stenosis of her proximal common carotid artery at the time of surgery which is not severe. She has mild left internal carotid occlusive disease.     Assessment:     Possible stenosis at carotid subclavian anastomosis or proximal right common carotid artery with increasing gradient between right and left arms-asymptomatic    Plan:       Arch aortogram with right innominate angiogram to look at common carotid artery and carotid subclavian anastomosis . Scheduled for Tuesday, January 22 by Dr. Trula Slade

## 2011-05-11 NOTE — Interval H&P Note (Signed)
History and Physical Interval Note:  05/11/2011 7:17 AM  [name]  has presented today for surgery, with the diagnosis of carotid stenosis  The various methods of treatment have been discussed with the patient and family. After consideration of risks, benefits and other options for treatment, the patient has consented to  Procedure(s): CAROTID STENT INSERTION as a surgical intervention .  The patients' history has been reviewed, patient examined, no change in status, stable for surgery.  I have reviewed the patients' chart and labs.  Questions were answered to the patient's satisfaction.     Nadene Witherspoon IV, VRock Nephew  I discussed the procedure in detail with the patient.  We discussed the possibility of not being able to complete the stent due to vessel tortuosity and the risk of stroke

## 2011-05-12 ENCOUNTER — Other Ambulatory Visit: Payer: Self-pay | Admitting: Physician Assistant

## 2011-05-12 ENCOUNTER — Other Ambulatory Visit: Payer: Self-pay | Admitting: *Deleted

## 2011-05-12 DIAGNOSIS — I6529 Occlusion and stenosis of unspecified carotid artery: Secondary | ICD-10-CM

## 2011-05-12 LAB — BASIC METABOLIC PANEL
BUN: 10 mg/dL (ref 6–23)
CO2: 21 mEq/L (ref 19–32)
Chloride: 111 mEq/L (ref 96–112)
Creatinine, Ser: 0.72 mg/dL (ref 0.50–1.10)
Potassium: 3.9 mEq/L (ref 3.5–5.1)

## 2011-05-12 LAB — POCT I-STAT, CHEM 8
BUN: 8 mg/dL (ref 6–23)
Calcium, Ion: 1.19 mmol/L (ref 1.12–1.32)
Chloride: 113 mEq/L — ABNORMAL HIGH (ref 96–112)
Creatinine, Ser: 0.8 mg/dL (ref 0.50–1.10)
TCO2: 22 mmol/L (ref 0–100)

## 2011-05-12 LAB — CBC
HCT: 32.8 % — ABNORMAL LOW (ref 36.0–46.0)
Hemoglobin: 10.8 g/dL — ABNORMAL LOW (ref 12.0–15.0)
MCV: 92.1 fL (ref 78.0–100.0)
RBC: 3.56 MIL/uL — ABNORMAL LOW (ref 3.87–5.11)
WBC: 8.1 10*3/uL (ref 4.0–10.5)

## 2011-05-12 MED ORDER — OXYCODONE HCL 5 MG PO TABS: 5.0000 mg | ORAL_TABLET | ORAL | Status: AC | PRN

## 2011-05-12 NOTE — Progress Notes (Addendum)
[facility] Progress Note  05/12/2011 7:22 AM POD 1  Subjective:  States she feels good this am.  Afebrile x 24hrs 98%RA   110-150sys Filed Vitals:   05/12/11 0330  BP: 124/63  Pulse: 67  Temp: 97.7 F (36.5 C)  Resp: 19     Physical Exam: Neuro:  In tact without deficits Incision:  Right arm dressing c/d/i.  No hematoma noted.  CBC    Component Value Date/Time   WBC 8.1 05/12/2011 0358   RBC 3.56* 05/12/2011 0358   HGB 10.8* 05/12/2011 0358   HCT 32.8* 05/12/2011 0358   PLT 204 05/12/2011 0358   MCV 92.1 05/12/2011 0358   MCH 30.3 05/12/2011 0358   MCHC 32.9 05/12/2011 0358   RDW 14.1 05/12/2011 0358   LYMPHSABS 4.2* 12/03/2009 1123   MONOABS 0.4 12/03/2009 1123   EOSABS 0.1 12/03/2009 1123   BASOSABS 0.1 12/03/2009 1123    BMET    Component Value Date/Time   NA 142 05/12/2011 0358   K 3.9 05/12/2011 0358   CL 111 05/12/2011 0358   CO2 21 05/12/2011 0358   GLUCOSE 94 05/12/2011 0358   BUN 10 05/12/2011 0358   CREATININE 0.72 05/12/2011 0358   CALCIUM 9.4 05/12/2011 0358   CALCIUM 7.3* 04/18/2008 1710   GFRNONAA 84* 05/12/2011 0358   GFRAA >90 05/12/2011 0358       Assessment/Plan:  This is a 71 y.o. female who is s/p right Carotid stent placement POD 1 -BUN/Cr WNL -acute surgical blood loss anemia--tolerating -doing very well this am. -d/c home -f/u with Dr. Trula Slade 4 weeks with carotid duplex scan   Evorn Gong, PA-C [facility] [phone]

## 2011-05-12 NOTE — Progress Notes (Signed)
Pt discharged home with family. All discharge instructions were reviewed and all questions answered. MD prescribed Oxycodone 5 mg PO Q 4 hrs PRN for pain, both pt and family refused to take this prescription statin the "tylenol will be enough to control my pain". Prescription was properly destroyed. All belongings were sent home with patient.

## 2011-05-12 NOTE — Plan of Care (Signed)
Problem: Consults Goal: Diagnosis CEA/CES/AAA Stent Carotid Endoscopic Stent (CES)

## 2011-05-12 NOTE — Discharge Summary (Signed)
[facility] Discharge Summary  [name] 1941-01-28 71 y.o. female  [id number]  Admission Date: 05/11/2011  Discharge Date: 05/12/11  Physician: Theotis Burrow, MD  Admission Diagnosis: carotid stenosis   HPI:   This is a 71 y.o. 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 Right arm has remained asymptomatic. She does occasionally have some blurred vision in the right eye. She denies any lateralizing weakness, aphasia, temporary blindness, or other neurologic he has been noted to have elevated velocity at her carotid subclavian anastomosis one year ago with a 20 mm gradient between arms. Preoperatively this was 80 mm.    Hospital Course:  The patient was admitted to the hospital and taken to the peripheral vascular lab on 05/11/2011 and underwent right carotid artery stent placement.  The pt tolerated the procedure well and was transported to the PACU in good condition. By POD 1, her neuro exam is in tact without deficit. The remainder of the hospital course consisted of increasing ambulation and increasing intake of solids without difficulty.    Basename 05/12/11 0358  NA 142  K 3.9  CL 111  CO2 21  GLUCOSE 94  BUN 10  CALCIUM 9.4    Basename 05/12/11 0358  WBC 8.1  HGB 10.8*  HCT 32.8*  PLT 204   No results found for this basename: INR:2 in the last 72 hours   Discharge Instructions:   The patient is discharged to home with extensive instructions on wound care and progressive ambulation.  They are instructed not to drive or perform any heavy lifting until returning to see the physician in his office.  Discharge Orders    Future Orders Please Complete By Expires   Resume previous diet      Driving Restrictions      Comments:   No driving for 2 weeks   Lifting restrictions      Comments:   No lifting for 6 weeks   Call MD for:  temperature >100.5      Call MD for:  redness, tenderness, or signs of infection (pain, swelling, bleeding, redness, odor or green/yellow discharge around incision site)      Call MD for:  severe or increased pain, loss or decreased feeling  in affected limb(s)      CAROTID Sugery: Call MD for difficulty swallowing or speaking; weakness in arms or legs that is a new symtom; severe headache.  If you have increased swelling in the neck and/or  are having difficulty breathing, CALL 911      may wash over wound with mild soap and water      Scheduling Instructions:   Shower daily with soap and water.      Discharge Diagnosis:  carotid stenosis  Secondary Diagnosis: Patient Active Problem List  Diagnoses  . Occlusion and stenosis of carotid artery without mention of cerebral infarction   Past Medical History  Diagnosis Date  . Hypertension   . Arthritis   . Joint pain   . Allergy   . GERD (gastroesophageal reflux disease)   . Thyroid disease        [name], [name]  Home Medication Instructions [id number]   Printed on:05/12/11 0736  Medication Information                    lisinopril (PRINIVIL,ZESTRIL) 10 MG tablet Take 10 mg  by mouth daily.             metoprolol succinate (TOPROL-XL) 25 MG 24 hr tablet Take 12.5 mg by mouth daily.            acetaminophen (TYLENOL) 325 MG tablet Take 325 mg by mouth every 6 (six) hours as needed. For pain           levothyroxine (SYNTHROID, LEVOTHROID) 75 MCG tablet Take 75 mcg by mouth daily.           guaiFENesin (MUCINEX) 600 MG 12 hr tablet Take 600 mg by mouth 2 (two) times daily as needed. For cold symptoms           aspirin 81 MG chewable tablet Chew 81 mg by mouth daily.           cetirizine (ZYRTEC) 10 MG tablet Take 10 mg by mouth daily as needed. For allergies           clopidogrel (PLAVIX) 75 MG tablet Take 75 mg by mouth daily.           oxyCODONE  (ROXICODONE) 5 MG immediate release tablet Take 1 tablet (5 mg total) by mouth every 4 (four) hours as needed for pain.             Disposition: home  Patient's condition: is Good  Follow up: 1. Dr. Trula Slade in 4 weeks with carotid duplex scan.   Evorn Gong, PA-C [facility] [phone] 05/12/2011  7:36 AM

## 2011-05-13 MED FILL — Dextrose Inj 5%: INTRAVENOUS | Qty: 50 | Status: AC

## 2011-05-13 NOTE — Discharge Summary (Signed)
Agree with above  [name]

## 2011-06-14 ENCOUNTER — Encounter: Payer: Self-pay | Admitting: Vascular Surgery

## 2011-06-15 ENCOUNTER — Other Ambulatory Visit: Payer: Medicare Other

## 2011-06-15 ENCOUNTER — Encounter: Payer: Self-pay | Admitting: Vascular Surgery

## 2011-06-15 ENCOUNTER — Other Ambulatory Visit (INDEPENDENT_AMBULATORY_CARE_PROVIDER_SITE_OTHER): Payer: Medicare Other | Admitting: *Deleted

## 2011-06-15 ENCOUNTER — Ambulatory Visit (INDEPENDENT_AMBULATORY_CARE_PROVIDER_SITE_OTHER): Payer: Medicare Other | Admitting: Vascular Surgery

## 2011-06-15 ENCOUNTER — Ambulatory Visit: Payer: Medicare Other | Admitting: Vascular Surgery

## 2011-06-15 VITALS — BP 145/50 | HR 87 | Resp 16 | Ht 63.0 in | Wt 142.0 lb

## 2011-06-15 DIAGNOSIS — Z48812 Encounter for surgical aftercare following surgery on the circulatory system: Secondary | ICD-10-CM

## 2011-06-15 DIAGNOSIS — I6529 Occlusion and stenosis of unspecified carotid artery: Secondary | ICD-10-CM

## 2011-06-15 NOTE — Progress Notes (Signed)
Subjective:     Patient ID: [name], female   DOB: 01/26/1941, 71 y.o.   MRN: [id number]  HPI this 71 year old has previously undergone right carotid subclavian anastomosis by me for total occlusion of right subclavian artery with arm claudication she developed significant stenosis in her right proximal common carotid artery which was treated with PTA and stenting by Dr. Trula Slade 05/11/2011. She has no neurologic or arm symptoms at this time. Blood pressures in her upper extremities have been checked regularly and has been equal. She denies numbness or tingling in the right upper extremity. She denies lateralizing weakness, number Cedax, diplopia, blurred vision, syncope.  Past Medical History  Diagnosis Date  . Hypertension   . Arthritis   . Joint pain   . Allergy   . GERD (gastroesophageal reflux disease)   . Thyroid disease     History  Substance Use Topics  . Smoking status: Former Smoker -- 0.3 packs/day for 5 years    Types: Cigarettes    Quit date: 04/12/1980  . Smokeless tobacco: Never Used  . Alcohol Use: No    Family History  Problem Relation Age of Onset  . Heart failure Mother   . Cancer Father     No Known Allergies  Current outpatient prescriptions:acetaminophen (TYLENOL) 325 MG tablet, Take 325 mg by mouth every 6 (six) hours as needed. For pain, Disp: , Rfl: ;  aspirin 81 MG chewable tablet, Chew 81 mg by mouth daily., Disp: , Rfl: ;  cetirizine (ZYRTEC) 10 MG tablet, Take 10 mg by mouth daily as needed. For allergies, Disp: , Rfl: ;  clopidogrel (PLAVIX) 75 MG tablet, Take 75 mg by mouth daily., Disp: , Rfl:  guaiFENesin (MUCINEX) 600 MG 12 hr tablet, Take 600 mg by mouth 2 (two) times daily as needed. For cold symptoms, Disp: , Rfl: ;  levothyroxine (SYNTHROID, LEVOTHROID) 75 MCG tablet, Take 75 mcg by mouth daily., Disp: , Rfl: ;  lisinopril (PRINIVIL,ZESTRIL) 10 MG tablet, Take 10 mg by mouth daily.  , Disp: , Rfl: ;  metoprolol succinate (TOPROL-XL) 25 MG  24 hr tablet, Take 12.5 mg by mouth daily. , Disp: , Rfl:   BP 145/50  Pulse 87  Resp 16  Ht _0  (1.6 m)  Wt 142 lb (64.411 kg)  BMI 25.15 kg/m2  SpO2 100%  Body mass index is 25.15 kg/(m^2).         Review of Systems chest pain, dyspnea on exertion, PND, orthopnea, chronic bronchitis. All other systems are negative    Objective:   Physical Exam blood pressure 145/50 heart rate 87 respirations 16 Gen.-alert and oriented x3 in no apparent distress HEENT normal for age Lungs no rhonchi or wheezing Cardiovascular regular rhythm no murmurs carotid pulses 3+ palpable, there is a harsh bruit on the right side in the supraclavicular area. No bruit noted on the left. Her extremity pulses are 3+ bilaterally at the brachial level and 1-2+ at the radial level. Abdomen soft nontender no palpable masses Musculoskeletal free of  major deformities Skin clear -no rashes Neurologic normal Lower extremities 3+ femoral and dorsalis pedis pulses palpable bilaterally with no edema  Today I ordered a carotid duplex exam with scant of her stent in the right proximal common carotid artery. There is no evidence of flow reduction in the internal carotid artery on the right. There is what appears to be residual stenosis it within the stent with a peak systolic velocity of 510 cm/s. This does not  seem to be restricting flow to her carotid or subclavian arteries.       Assessment:     Possible residual stenosis with in-stent of right proximal common carotid artery status post right carotid subclavian anastomosis-asymptomatic    Plan:     Return in 6 months with repeat duplex scan right common carotid stent. He continues to have extremely high velocities were need repeat angiography per Dr. Trula Slade

## 2011-06-15 NOTE — Progress Notes (Signed)
Addended by: Mena Goes on: 06/15/2011 12:56 PM   Modules accepted: Orders

## 2011-06-23 NOTE — Procedures (Unsigned)
CAROTID DUPLEX EXAM  INDICATION:  Followup stenting of right CCA bypass performed 05/11/2011.  HISTORY: Diabetes:  no Cardiac:  no Hypertension:  yes Smoking:  previous Previous Surgery:  Right carotid subclavian transposition bypass September 2011, with stenting performed 05/11/2011. CV History:  Currently asymptomatic Amaurosis Fugax No, Paresthesias No, Hemiparesis No                                      RIGHT             LEFT Brachial systolic pressure: Brachial Doppler waveforms: Vertebral direction of flow:        Antegrade DUPLEX VELOCITIES (cm/sec) CCA peak systolic                   P=541 E=321 ECA peak systolic                   224 ICA peak systolic                   92 ICA end diastolic                   20 PLAQUE MORPHOLOGY:                  Heterogenous PLAQUE AMOUNT:                      Severe PLAQUE LOCATION:                    Proximal CCA  IMPRESSION:  Large amount of heterogenous plaque noted in the proximal CCA/stent segment with peak velocity noted of 541 cm/sec.  Right ICA velocities are suggestive of a 1% to 39% stenosis.  Antegrade right vertebral artery.  ___________________________________________ Nelda Severe Kellie Simmering, M.D.  EM/MEDQ  D:  06/16/2011  T:  06/16/2011  Job:  [id number]

## 2011-08-21 ENCOUNTER — Other Ambulatory Visit: Payer: Self-pay | Admitting: Surgery

## 2011-08-26 ENCOUNTER — Other Ambulatory Visit: Payer: Self-pay | Admitting: Surgery

## 2011-08-27 ENCOUNTER — Other Ambulatory Visit: Payer: Self-pay | Admitting: Surgery

## 2011-12-10 ENCOUNTER — Encounter: Payer: Self-pay | Admitting: Neurosurgery

## 2011-12-14 ENCOUNTER — Ambulatory Visit (INDEPENDENT_AMBULATORY_CARE_PROVIDER_SITE_OTHER): Payer: Medicare Other | Admitting: Vascular Surgery

## 2011-12-14 ENCOUNTER — Ambulatory Visit (INDEPENDENT_AMBULATORY_CARE_PROVIDER_SITE_OTHER): Payer: Medicare Other | Admitting: Neurosurgery

## 2011-12-14 ENCOUNTER — Encounter: Payer: Self-pay | Admitting: Neurosurgery

## 2011-12-14 VITALS — BP 145/67 | HR 79 | Resp 16 | Ht 63.0 in | Wt 142.0 lb

## 2011-12-14 DIAGNOSIS — I6529 Occlusion and stenosis of unspecified carotid artery: Secondary | ICD-10-CM

## 2011-12-14 DIAGNOSIS — Z48812 Encounter for surgical aftercare following surgery on the circulatory system: Secondary | ICD-10-CM

## 2011-12-14 NOTE — Progress Notes (Signed)
Carotid duplex performed @ [facility] 12/14/2011

## 2011-12-14 NOTE — Progress Notes (Signed)
[facility] [place] Carotid Office Note  CC: Carotid surveillance Referring Physician: Kellie Simmering  History of Present Illness: 71 year old female patient of Dr. Kellie Simmering status post right common carotid artery stent in January 2013 with Dr. Stephens Shire involvement. The patient denies signs or symptoms of CVA, TIA, amaurosis fugax or any neural deficit. The patient denies any new medical diagnoses or recent surgery.  Past Medical History  Diagnosis Date  . Hypertension   . Arthritis   . Joint pain   . Allergy   . GERD (gastroesophageal reflux disease)   . Thyroid disease     ROS: _0  Positive   _1  Denies    General: _2  Weight loss, _3  Fever, _4  chills Neurologic: _5  Dizziness, _6  Blackouts, _7  Seizure _8  Stroke, _9  "Mini stroke", _10  Slurred speech, _11  Temporary blindness; _12  weakness in arms or legs, _13  Hoarseness Cardiac: _14  Chest pain/pressure, _15  Shortness of breath at rest _16  Shortness of breath with exertion, _17  Atrial fibrillation or irregular heartbeat Vascular: _18  Pain in legs with walking, _19  Pain in legs at rest, _20  Pain in legs at night,  _21  Non-healing ulcer, _22  Blood clot in vein/DVT,   Pulmonary: _23  Home oxygen, _24  Productive cough, _25  Coughing up blood, _26  Asthma,  _27  Wheezing Musculoskeletal:  _28  Arthritis, _29  Low back pain, _30  Joint pain Hematologic: _31  Easy Bruising, _32  Anemia; _33  Hepatitis Gastrointestinal: _34  Blood in stool, _35  Gastroesophageal Reflux/heartburn, _36  Trouble swallowing Urinary: _37  chronic Kidney disease, _38  on HD - _39  MWF or _40  TTHS, _41  Burning with urination, _42  Difficulty urinating Skin: _43  Rashes, _44  Wounds Psychological: _45  Anxiety, _46  Depression   Social History History  Substance Use Topics  . Smoking status: Former Smoker -- 0.3 packs/day for 5 years    Types: Cigarettes    Quit date: 04/12/1980  . Smokeless tobacco: Never Used  . Alcohol Use: No    Family History Family  History  Problem Relation Age of Onset  . Heart failure Mother   . Cancer Father     No Known Allergies  Current Outpatient Prescriptions  Medication Sig Dispense Refill  . acetaminophen (TYLENOL) 325 MG tablet Take 325 mg by mouth every 6 (six) hours as needed. For pain      . aspirin 81 MG chewable tablet Chew 81 mg by mouth daily.      . cetirizine (ZYRTEC) 10 MG tablet Take 10 mg by mouth daily as needed. For allergies       . clopidogrel (PLAVIX) 75 MG tablet TAKE 1 TABLET BY MOUTH EVERY DAY  30 tablet  3  . guaiFENesin (MUCINEX) 600 MG 12 hr tablet Take 600 mg by mouth 2 (two) times daily as needed. For cold symptoms      . levothyroxine (SYNTHROID, LEVOTHROID) 75 MCG tablet Take 75 mcg by mouth every other day.       . lisinopril (PRINIVIL,ZESTRIL) 10 MG tablet Take 10 mg by mouth daily.        . metoprolol succinate (TOPROL-XL) 25 MG 24 hr tablet Take 12.5 mg by mouth daily.         Physical Examination  Filed Vitals:   12/14/11 1204  BP: 145/67  Pulse: 79  Resp:  Body mass index is 25.15 kg/(m^2).  General:  WDWN in NAD Gait: Normal HEENT: WNL Eyes: Pupils equal Pulmonary: normal non-labored breathing , without Rales, rhonchi,  wheezing Cardiac: RRR, without  Murmurs, rubs or gallops; Abdomen: soft, NT, no masses Skin: no rashes, ulcers noted  Vascular Exam Pulses: 3+ radial pulses bilaterally Carotid bruits: Carotid pulse on the left auscultation, on the right a bruit is heard Extremities without ischemic changes, no Gangrene , no cellulitis; no open wounds;  Musculoskeletal: no muscle wasting or atrophy   Neurologic: A&O X 3; Appropriate Affect ; SENSATION: normal; MOTOR FUNCTION:  moving all extremities equally. Speech is fluent/normal  Non-Invasive Vascular Imaging CAROTID DUPLEX 12/14/2011  Right ICA 20 - 39 % stenosis Left ICA 20 - 39 % stenosis   ASSESSMENT/PLAN: Asymptomatic patient with history of right, carotid stent January 2013. The patient  will followup in 6 months with repeat carotid duplex, her questions were encouraged and answered, she is in agreement with this plan .  Beatris Ship ANP   Clinic MD: Kellie Simmering

## 2011-12-14 NOTE — Addendum Note (Signed)
Addended by: Mena Goes on: 12/14/2011 01:04 PM   Modules accepted: Orders

## 2012-01-18 ENCOUNTER — Other Ambulatory Visit (HOSPITAL_COMMUNITY): Payer: Self-pay | Admitting: Nurse Practitioner

## 2012-01-18 DIAGNOSIS — Z1231 Encounter for screening mammogram for malignant neoplasm of breast: Secondary | ICD-10-CM

## 2012-01-18 DIAGNOSIS — Z780 Asymptomatic menopausal state: Secondary | ICD-10-CM

## 2012-02-18 ENCOUNTER — Ambulatory Visit (HOSPITAL_COMMUNITY)
Admission: RE | Admit: 2012-02-18 | Discharge: 2012-02-18 | Disposition: A | Discharge status: Home / Self Care | Payer: Medicare Other | Source: Ambulatory Visit | Attending: Nurse Practitioner | Admitting: Nurse Practitioner

## 2012-02-18 DIAGNOSIS — Z1231 Encounter for screening mammogram for malignant neoplasm of breast: Secondary | ICD-10-CM | POA: Insufficient documentation

## 2012-02-18 DIAGNOSIS — Z780 Asymptomatic menopausal state: Secondary | ICD-10-CM

## 2012-06-09 ENCOUNTER — Encounter: Payer: Self-pay | Admitting: Neurosurgery

## 2012-06-12 ENCOUNTER — Encounter: Payer: Self-pay | Admitting: Neurosurgery

## 2012-06-12 ENCOUNTER — Ambulatory Visit (INDEPENDENT_AMBULATORY_CARE_PROVIDER_SITE_OTHER): Payer: Medicare Other | Admitting: Neurosurgery

## 2012-06-12 ENCOUNTER — Other Ambulatory Visit (INDEPENDENT_AMBULATORY_CARE_PROVIDER_SITE_OTHER): Payer: Medicare Other | Admitting: *Deleted

## 2012-06-12 DIAGNOSIS — I6529 Occlusion and stenosis of unspecified carotid artery: Secondary | ICD-10-CM

## 2012-06-12 DIAGNOSIS — Z48812 Encounter for surgical aftercare following surgery on the circulatory system: Secondary | ICD-10-CM

## 2012-06-12 NOTE — Progress Notes (Signed)
[facility] Carotid Office Note  CC: Carotid surveillance Referring Physician: Lawson/Brabham  History of Present Illness: 72 year old female patient of Dr. Kellie Simmering status post right common carotid artery stenting in January 2013 with a history of her right carotid subclavian artery anastomosis explored with subclavian artery transected and stopped sewn over in September 2011 with Dr. Kellie Simmering. The patient denies any signs or symptoms of CVA, TIA, amaurosis fugax. The patient denies any right upper extremity pain.  Past Medical History  Diagnosis Date  . Hypertension   . Arthritis   . Joint pain   . Allergy   . GERD (gastroesophageal reflux disease)   . Thyroid disease   . Carotid artery occlusion     ROS: _0  Positive   _1  Denies    General: _2  Weight loss, _3  Fever, _4  chills Neurologic: _5  Dizziness, _6  Blackouts, _7  Seizure _8  Stroke, _9  "Mini stroke", _10  Slurred speech, _11  Temporary blindness; _12  weakness in arms or legs, _13  Hoarseness Cardiac: _14  Chest pain/pressure, _15  Shortness of breath at rest _16  Shortness of breath with exertion, _17  Atrial fibrillation or irregular heartbeat Vascular: _18  Pain in legs with walking, _19  Pain in legs at rest, _20  Pain in legs at night,  _21  Non-healing ulcer, _22  Blood clot in vein/DVT,   Pulmonary: _23  Home oxygen, _24  Productive cough, _25  Coughing up blood, _26  Asthma,  _27  Wheezing Musculoskeletal:  _28  Arthritis, _29  Low back pain, _30  Joint pain Hematologic: _31  Easy Bruising, _32  Anemia; _33  Hepatitis Gastrointestinal: _34  Blood in stool, _35  Gastroesophageal Reflux/heartburn, _36  Trouble swallowing Urinary: _37  chronic Kidney disease, _38  on HD - _39  MWF or _40  TTHS, _41  Burning with urination, _42  Difficulty urinating Skin: _43  Rashes, _44  Wounds Psychological: _45  Anxiety, _46  Depression   Social History History  Substance Use Topics  . Smoking status: Former Smoker -- 0.30  packs/day for 5 years    Types: Cigarettes    Quit date: 04/12/1980  . Smokeless tobacco: Never Used  . Alcohol Use: No    Family History Family History  Problem Relation Age of Onset  . Heart failure Mother   . Cancer Father     No Known Allergies  Current Outpatient Prescriptions  Medication Sig Dispense Refill  . acetaminophen (TYLENOL) 325 MG tablet Take 325 mg by mouth every 6 (six) hours as needed. For pain      . aspirin 81 MG chewable tablet Chew 81 mg by mouth daily.      . cetirizine (ZYRTEC) 10 MG tablet Take 10 mg by mouth daily as needed. For allergies       . clopidogrel (PLAVIX) 75 MG tablet TAKE 1 TABLET BY MOUTH EVERY DAY  30 tablet  3  . guaiFENesin (MUCINEX) 600 MG 12 hr tablet Take 600 mg by mouth 2 (two) times daily as needed. For cold symptoms      . levothyroxine (SYNTHROID, LEVOTHROID) 75 MCG tablet Take 75 mcg by mouth every other day.       . lisinopril (PRINIVIL,ZESTRIL) 10 MG tablet Take 10 mg by mouth daily.        . metoprolol succinate (TOPROL-XL) 25 MG 24 hr tablet Take 12.5 mg by mouth daily.  No current facility-administered medications for this visit.    Physical Examination  Filed Vitals:   06/12/12 1137  BP: 118/65  Pulse: 84  Resp:     Body mass index is 25.16 kg/(m^2).  General:  WDWN in NAD Gait: Normal HEENT: WNL Eyes: Pupils equal Pulmonary: normal non-labored breathing , without Rales, rhonchi,  wheezing Cardiac: RRR, without  Murmurs, rubs or gallops; Abdomen: soft, NT, no masses Skin: no rashes, ulcers noted  Vascular Exam Pulses: Palpable radial pulses bilaterally, Dr. Kellie Simmering examined the patient as well Carotid bruits: Carotid pulses to auscultation Extremities without ischemic changes, no Gangrene , no cellulitis; no open wounds;  Musculoskeletal: no muscle wasting or atrophy   Neurologic: A&O X 3; Appropriate Affect ; SENSATION: normal; MOTOR FUNCTION:  moving all extremities equally. Speech is  fluent/normal  Non-Invasive Vascular Imaging CAROTID DUPLEX 06/12/2012  There is elevated velocities with heterogeneous plaque present at the distal stent anastomosis with evidence of less than 40% bilateral ICA stenosis distal stent peak velocity is 477, she was 450 in September 2013.  ASSESSMENT/PLAN: Dr. Kellie Simmering explained to the patient there may be some narrowing within her stent.  She is scheduled  for arch aortogram with possible intervention with Dr. Trula Slade on 06/20/2012. The patient's followup will be pending that procedure. The patient's questions were encouraged and answered, she is in agreement with this plan.  Beatris Ship ANP Clinic MD: Kellie Simmering

## 2012-06-27 ENCOUNTER — Other Ambulatory Visit: Payer: Self-pay

## 2012-07-04 ENCOUNTER — Ambulatory Visit (HOSPITAL_COMMUNITY)
Admission: RE | Admit: 2012-07-04 | Discharge: 2012-07-04 | Disposition: A | Discharge status: Home / Self Care | Payer: Medicare Other | Source: Ambulatory Visit | Attending: Surgery | Admitting: Surgery

## 2012-07-04 ENCOUNTER — Encounter (HOSPITAL_COMMUNITY)
Admission: RE | Disposition: A | Discharge status: Home / Self Care | Payer: Self-pay | Source: Ambulatory Visit | Attending: Surgery

## 2012-07-04 ENCOUNTER — Other Ambulatory Visit: Payer: Self-pay | Admitting: *Deleted

## 2012-07-04 ENCOUNTER — Telehealth: Payer: Self-pay | Admitting: Surgery

## 2012-07-04 DIAGNOSIS — I6529 Occlusion and stenosis of unspecified carotid artery: Secondary | ICD-10-CM | POA: Insufficient documentation

## 2012-07-04 DIAGNOSIS — E079 Disorder of thyroid, unspecified: Secondary | ICD-10-CM | POA: Insufficient documentation

## 2012-07-04 DIAGNOSIS — K219 Gastro-esophageal reflux disease without esophagitis: Secondary | ICD-10-CM | POA: Insufficient documentation

## 2012-07-04 DIAGNOSIS — I771 Stricture of artery: Secondary | ICD-10-CM | POA: Insufficient documentation

## 2012-07-04 DIAGNOSIS — Z7902 Long term (current) use of antithrombotics/antiplatelets: Secondary | ICD-10-CM | POA: Insufficient documentation

## 2012-07-04 DIAGNOSIS — Z79899 Other long term (current) drug therapy: Secondary | ICD-10-CM | POA: Insufficient documentation

## 2012-07-04 DIAGNOSIS — Z7982 Long term (current) use of aspirin: Secondary | ICD-10-CM | POA: Insufficient documentation

## 2012-07-04 DIAGNOSIS — I10 Essential (primary) hypertension: Secondary | ICD-10-CM | POA: Insufficient documentation

## 2012-07-04 DIAGNOSIS — M129 Arthropathy, unspecified: Secondary | ICD-10-CM | POA: Insufficient documentation

## 2012-07-04 DIAGNOSIS — Z87891 Personal history of nicotine dependence: Secondary | ICD-10-CM | POA: Insufficient documentation

## 2012-07-04 HISTORY — PX: ARCH AORTOGRAM: SHX5501

## 2012-07-04 HISTORY — PX: CAROTID ANGIOGRAM: SHX5504

## 2012-07-04 HISTORY — PX: CAROTID ANGIOGRAM: SHX5765

## 2012-07-04 LAB — POCT I-STAT, CHEM 8
BUN: 10 mg/dL (ref 6–23)
Calcium, Ion: 1.28 mmol/L (ref 1.13–1.30)
TCO2: 26 mmol/L (ref 0–100)

## 2012-07-04 SURGERY — ARCH AORTOGRAM
Anesthesia: LOCAL

## 2012-07-04 MED ORDER — SODIUM CHLORIDE 0.9 % IV SOLN
1.0000 mL/kg/h | INTRAVENOUS | Status: DC
  Administered 2012-07-04: 1 mL/kg/h via INTRAVENOUS

## 2012-07-04 MED ORDER — HEPARIN SODIUM (PORCINE) 1000 UNIT/ML IJ SOLN
INTRAMUSCULAR | Status: AC
  Filled 2012-07-04: qty 1

## 2012-07-04 MED ORDER — METOPROLOL TARTRATE 1 MG/ML IV SOLN: 2.0000 mg | INTRAVENOUS | Status: DC | PRN

## 2012-07-04 MED ORDER — PHENOL 1.4 % MT LIQD: 1.0000 | OROMUCOSAL | Status: DC | PRN

## 2012-07-04 MED ORDER — OXYCODONE-ACETAMINOPHEN 5-325 MG PO TABS: 1.0000 | ORAL_TABLET | ORAL | Status: DC | PRN

## 2012-07-04 MED ORDER — MORPHINE SULFATE 10 MG/ML IJ SOLN
2.0000 mg | INTRAMUSCULAR | Status: DC | PRN
Start: 2012-07-04 — End: 2012-07-04

## 2012-07-04 MED ORDER — ACETAMINOPHEN 325 MG PO TABS: 325.0000 mg | ORAL_TABLET | ORAL | Status: DC | PRN

## 2012-07-04 MED ORDER — ALUM & MAG HYDROXIDE-SIMETH 200-200-20 MG/5ML PO SUSP: 15.0000 mL | ORAL | Status: DC | PRN

## 2012-07-04 MED ORDER — HYDRALAZINE HCL 20 MG/ML IJ SOLN: 10.0000 mg | INTRAMUSCULAR | Status: DC | PRN

## 2012-07-04 MED ORDER — HEPARIN (PORCINE) IN NACL 2-0.9 UNIT/ML-% IJ SOLN
INTRAMUSCULAR | Status: AC
  Filled 2012-07-04: qty 1000

## 2012-07-04 MED ORDER — ONDANSETRON HCL 4 MG/2ML IJ SOLN: 4.0000 mg | Freq: Four times a day (QID) | INTRAMUSCULAR | Status: DC | PRN

## 2012-07-04 MED ORDER — LIDOCAINE HCL (PF) 1 % IJ SOLN
INTRAMUSCULAR | Status: AC
  Filled 2012-07-04: qty 30

## 2012-07-04 MED ORDER — ACETAMINOPHEN 325 MG RE SUPP: 325.0000 mg | RECTAL | Status: DC | PRN

## 2012-07-04 MED ORDER — LABETALOL HCL 5 MG/ML IV SOLN: 10.0000 mg | INTRAVENOUS | Status: DC | PRN

## 2012-07-04 MED ORDER — SODIUM CHLORIDE 0.9 % IV SOLN
INTRAVENOUS | Status: DC
  Administered 2012-07-04: 06:00:00 via INTRAVENOUS

## 2012-07-04 MED ORDER — GUAIFENESIN-DM 100-10 MG/5ML PO SYRP: 15.0000 mL | ORAL_SOLUTION | ORAL | Status: DC | PRN

## 2012-07-04 NOTE — Op Note (Signed)
[facility]  Patient name: [name] MRN: [id number] DOB: 11-02-1940 Sex: female  07/04/2012 Pre-operative Diagnosis: Possible carotid stenting, in-stent stenosis Post-operative diagnosis:  Same Surgeon:  Eldridge Abrahams Procedure Performed:  1.  ultrasound-guided access, right brachial artery  2.  aortic arch angiogram  3.  right arm angiogram  4.  second order catheterization   Indications:  The patient has a history of a right carotid subclavian transposition. There was a stenosis proximal to the anastomosis which has undergone stenting using a 9 x 30 self-expanding stent. Ultrasound has identified progressively elevated velocities through the stent. The patient comes in for further evaluation and possible intervention.  Procedure:  The patient was identified in the holding area and taken to room 8.  The patient was then placed supine on the table and prepped and draped in the usual sterile fashion.  A time out was called.  Ultrasound was used to evaluate the right brachial artery. It was patent without significant calcification. One percent lidocaine was used for local anesthesia. The right brachial artery was then cannulated under ultrasound guidance using a micropuncture needle. An 018 wire was advanced without resistance and a micropuncture sheath was placed. Next a Britta Mccreedy wire was inserted and a 5 French sheath was placed. 200 mcg of nitroglycerin and 400 units of heparin were administered through the sheath. I then used a IMA catheter and a Glidewire to get access into the a sitting aorta. An omni-flush catheter was placed. An aortic arch angiogram was obtained with the catheter in the a sitting aorta. The catheter was then pulled back into the innominate artery and right arm angiograms were obtained.  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 No evidence of stenosis is identified within the proximal innominate artery. The  right common carotid artery is widely patent. There is a subclavian to carotid transposition. The anastomosis is widely patent. There is a stent identified within the innominate artery and proximal right common carotid artery. There is approximately a 40-50% narrowing within the stent which is do to external compression from a plaque. Multiple oblique images were obtained to further evaluate the stent. No intimal hyperplasia is identified. A pullback pressure across this area of concern revealed only a 5 mm change. I also reviewed the initial angiogram from the initial stent deployment. There appears to be no significant change within the flow pattern through the stent. Therefore, the decision was made not to intervene but to continue to follow the patient.   Intervention:  None  Impression:  #1  narrowing within the previously deployed stent which is similar to the initial deployment pictures. This is due to external compression from a calcified plaque. No evidence of intimal hyperplasia is identified. Only a 5 mm gradient was recorded across the concerned area.  #2  the carotid subclavian transposition anastomosis is widely   V. Annamarie Major, M.D. [facility] Office: [phone] Pager:  [phone]

## 2012-07-04 NOTE — Telephone Encounter (Addendum)
Message copied by Doristine Section on Tue Jul 04, 2012  2:56 PM ------      Message from: Alfonso Patten      Created: Tue Jul 04, 2012 11:22 AM                   ----- Message -----         From: Serafina [name], MD         Sent: 07/04/2012   8:57 AM           To: Patrici Ranks, Alfonso Patten, RN            07/04/2012:                  Procedure Performed:       1.  ultrasound-guided access, right brachial artery       2.  aortic arch angiogram       3.  right arm angiogram       4.  second order catheterization                  Please schedule the patient to followup with Dr. Kellie Simmering in 6 months with a carotid duplex ------  notified pt. of fu appt. on 01-02-13 at 1:00 pm

## 2012-07-04 NOTE — H&P (View-Only) (Signed)
[name] Office Note  CC: Carotid surveillance Referring Physician: Lawson/Brabham  History of Present Illness: 72 year old female patient of Dr. Kellie Simmering status post right common carotid artery stenting in January 2013 with a history of her right carotid subclavian artery anastomosis explored with subclavian artery transected and stopped sewn over in September 2011 with Dr. Kellie Simmering. The patient denies any signs or symptoms of CVA, TIA, amaurosis fugax. The patient denies any right upper extremity pain.  Past Medical History  Diagnosis Date  . Hypertension   . Arthritis   . Joint pain   . Allergy   . GERD (gastroesophageal reflux disease)   . Thyroid disease   . Carotid artery occlusion     ROS: _0  Positive   _1  Denies    General: _2  Weight loss, _3  Fever, _4  chills Neurologic: _5  Dizziness, _6  Blackouts, _7  Seizure _8  Stroke, _9  "Mini stroke", _10  Slurred speech, _11  Temporary blindness; _12  weakness in arms or legs, _13  Hoarseness Cardiac: _14  Chest pain/pressure, _15  Shortness of breath at rest _16  Shortness of breath with exertion, _17  Atrial fibrillation or irregular heartbeat Vascular: _18  Pain in legs with walking, _19  Pain in legs at rest, _20  Pain in legs at night,  _21  Non-healing ulcer, _22  Blood clot in vein/DVT,   Pulmonary: _23  Home oxygen, _24  Productive cough, _25  Coughing up blood, _26  Asthma,  _27  Wheezing Musculoskeletal:  _28  Arthritis, _29  Low back pain, _30  Joint pain Hematologic: _31  Easy Bruising, _32  Anemia; _33  Hepatitis Gastrointestinal: _34  Blood in stool, _35  Gastroesophageal Reflux/heartburn, _36  Trouble swallowing Urinary: _37  chronic Kidney disease, _38  on HD - _39  MWF or _40  TTHS, _41  Burning with urination, _42  Difficulty urinating Skin: _43  Rashes, _44  Wounds Psychological: _45  Anxiety, _46  Depression   Social History History  Substance Use Topics  . Smoking status: Former Smoker -- 0.30  packs/day for 5 years    Types: Cigarettes    Quit date: 04/12/1980  . Smokeless tobacco: Never Used  . Alcohol Use: No    Family History Family History  Problem Relation Age of Onset  . Heart failure Mother   . Cancer Father     No Known Allergies  Current Outpatient Prescriptions  Medication Sig Dispense Refill  . acetaminophen (TYLENOL) 325 MG tablet Take 325 mg by mouth every 6 (six) hours as needed. For pain      . aspirin 81 MG chewable tablet Chew 81 mg by mouth daily.      . cetirizine (ZYRTEC) 10 MG tablet Take 10 mg by mouth daily as needed. For allergies       . clopidogrel (PLAVIX) 75 MG tablet TAKE 1 TABLET BY MOUTH EVERY DAY  30 tablet  3  . guaiFENesin (MUCINEX) 600 MG 12 hr tablet Take 600 mg by mouth 2 (two) times daily as needed. For cold symptoms      . levothyroxine (SYNTHROID, LEVOTHROID) 75 MCG tablet Take 75 mcg by mouth every other day.       . lisinopril (PRINIVIL,ZESTRIL) 10 MG tablet Take 10 mg by mouth daily.        . metoprolol succinate (TOPROL-XL) 25 MG 24 hr tablet Take 12.5 mg by mouth daily.  No current facility-administered medications for this visit.    Physical Examination  Filed Vitals:   06/12/12 1137  BP: 118/65  Pulse: 84  Resp:     Body mass index is 25.16 kg/(m^2).  General:  WDWN in NAD Gait: Normal HEENT: WNL Eyes: Pupils equal Pulmonary: normal non-labored breathing , without Rales, rhonchi,  wheezing Cardiac: RRR, without  Murmurs, rubs or gallops; Abdomen: soft, NT, no masses Skin: no rashes, ulcers noted  Vascular Exam Pulses: Palpable radial pulses bilaterally, Dr. Kellie Simmering examined the patient as well Carotid bruits: Carotid pulses to auscultation Extremities without ischemic changes, no Gangrene , no cellulitis; no open wounds;  Musculoskeletal: no muscle wasting or atrophy   Neurologic: A&O X 3; Appropriate Affect ; SENSATION: normal; MOTOR FUNCTION:  moving all extremities equally. Speech is  fluent/normal  Non-Invasive Vascular Imaging CAROTID DUPLEX 06/12/2012  There is elevated velocities with heterogeneous plaque present at the distal stent anastomosis with evidence of less than 40% bilateral ICA stenosis distal stent peak velocity is 477, she was 450 in September 2013.  ASSESSMENT/PLAN: Dr. Kellie Simmering explained to the patient there may be some narrowing within her stent.  She is scheduled  for arch aortogram with possible intervention with Dr. Trula Slade on 06/20/2012. The patient's followup will be pending that procedure. The patient's questions were encouraged and answered, she is in agreement with this plan.  Beatris Ship ANP Clinic MD: Kellie Simmering

## 2012-07-04 NOTE — Interval H&P Note (Signed)
History and Physical Interval Note:  07/04/2012 7:31 AM  [name]  has presented today for surgery, with the diagnosis of stenosis  The various methods of treatment have been discussed with the patient and family. After consideration of risks, benefits and other options for treatment, the patient has consented to  Procedure(s): ARCH AORTOGRAM (N/A) CAROTID ANGIOGRAM (N/A) as a surgical intervention .  The patient's history has been reviewed, patient examined, no change in status, stable for surgery.  I have reviewed the patient's chart and labs.  Questions were answered to the patient's satisfaction.     BRABHAM IV, V. WELLS

## 2012-07-24 ENCOUNTER — Other Ambulatory Visit (HOSPITAL_COMMUNITY): Payer: Self-pay | Admitting: Family Medicine

## 2012-07-24 DIAGNOSIS — R011 Cardiac murmur, unspecified: Secondary | ICD-10-CM

## 2012-07-27 ENCOUNTER — Ambulatory Visit (HOSPITAL_COMMUNITY)
Admission: RE | Admit: 2012-07-27 | Discharge: 2012-07-27 | Disposition: A | Discharge status: Home / Self Care | Payer: Medicare Other | Source: Ambulatory Visit | Attending: Family Medicine | Admitting: Family Medicine

## 2012-07-27 DIAGNOSIS — I10 Essential (primary) hypertension: Secondary | ICD-10-CM | POA: Insufficient documentation

## 2012-07-27 DIAGNOSIS — R011 Cardiac murmur, unspecified: Secondary | ICD-10-CM | POA: Insufficient documentation

## 2012-07-27 DIAGNOSIS — I519 Heart disease, unspecified: Secondary | ICD-10-CM

## 2012-07-27 NOTE — Progress Notes (Signed)
  Echocardiogram 2D Echocardiogram has been performed.  Kenith Trickel 07/27/2012, 11:07 AM

## 2012-08-01 ENCOUNTER — Ambulatory Visit: Payer: Medicare Other | Admitting: Vascular Surgery

## 2013-01-01 ENCOUNTER — Encounter: Payer: Self-pay | Admitting: Family

## 2013-01-02 ENCOUNTER — Ambulatory Visit: Payer: Medicare Other | Admitting: Vascular Surgery

## 2013-01-02 ENCOUNTER — Other Ambulatory Visit (INDEPENDENT_AMBULATORY_CARE_PROVIDER_SITE_OTHER): Payer: Medicare Other | Admitting: *Deleted

## 2013-01-02 ENCOUNTER — Other Ambulatory Visit: Payer: Medicare Other

## 2013-01-02 ENCOUNTER — Encounter: Payer: Self-pay | Admitting: Family

## 2013-01-02 ENCOUNTER — Ambulatory Visit (INDEPENDENT_AMBULATORY_CARE_PROVIDER_SITE_OTHER): Payer: Medicare Other | Admitting: Family

## 2013-01-02 DIAGNOSIS — I6529 Occlusion and stenosis of unspecified carotid artery: Secondary | ICD-10-CM

## 2013-01-02 DIAGNOSIS — Z48812 Encounter for surgical aftercare following surgery on the circulatory system: Secondary | ICD-10-CM

## 2013-01-02 NOTE — Patient Instructions (Signed)
Stroke Prevention Some medical conditions and behaviors are associated with an increased chance of having a stroke. You may prevent a stroke by making healthy choices and managing medical conditions. Reduce your risk of having a stroke by:  Staying physically active. Get at least 30 minutes of activity on most or all days.  Not smoking. It may also be helpful to avoid exposure to secondhand smoke.  Limiting alcohol use. Moderate alcohol use is considered to be:  No more than 2 drinks per day for men.  No more than 1 drink per day for nonpregnant women.  Eating healthy foods.  Include 5 or more servings of fruits and vegetables a day.  Certain diets may be prescribed to address high blood pressure, high cholesterol, diabetes, or obesity.  Managing your cholesterol levels.  A low-saturated fat, low-trans fat, low-cholesterol, and high-fiber diet may control cholesterol levels.  Take any prescribed medicines to control cholesterol as directed by your caregiver.  Managing your diabetes.  A controlled-carbohydrate, controlled-sugar diet is recommended to manage diabetes.  Take any prescribed medicines to control diabetes as directed by your caregiver.  Controlling your high blood pressure (hypertension).  A low-salt (sodium), low-saturated fat, low-trans fat, and low-cholesterol diet is recommended to manage high blood pressure.  Take any prescribed medicines to control hypertension as directed by your caregiver.  Maintaining a healthy weight.  A reduced-calorie, low-sodium, low-saturated fat, low-trans fat, low-cholesterol diet is recommended to manage weight.  Stopping drug abuse.  Avoiding birth control pills.  Talk to your caregiver about the risks of taking birth control pills if you are over 52 years old, smoke, get migraines, or have ever had a blood clot.  Getting evaluated for sleep disorders (sleep apnea).  Talk to your caregiver about getting a sleep evaluation  if you snore a lot or have excessive sleepiness.  Taking medicines as directed by your caregiver.  For some people, aspirin or blood thinners (anticoagulants) are helpful in reducing the risk of forming abnormal blood clots that can lead to stroke. If you have the irregular heart rhythm of atrial fibrillation, you should be on a blood thinner unless there is a good reason you cannot take them.  Understand all your medicine instructions. SEEK IMMEDIATE MEDICAL CARE IF:   You have sudden weakness or numbness of the face, arm, or leg, especially on one side of the body.  You have sudden confusion.  You have trouble speaking (aphasia) or understanding.  You have sudden trouble seeing in one or both eyes.  You have sudden trouble walking.  You have dizziness.  You have a loss of balance or coordination.  You have a sudden, severe headache with no known cause.  You have new chest pain or an irregular heartbeat. Any of these symptoms may represent a serious problem that is an emergency. Do not wait to see if the symptoms will go away. Get medical help right away. Call your local emergency services (911 in [place] Do not drive yourself to the hospital. Document Released: 05/06/2004 Document Revised: 06/21/2011 Document Reviewed: 11/16/2010 [facility] Patient Information 2014 [place], [place].

## 2013-01-02 NOTE — Progress Notes (Signed)
Established Carotid Patient  History of Present Illness  [name] is a 72 y.o. female patient of Dr. Kellie Simmering status post right common carotid artery stenting in January 2013 with a history of her right carotid subclavian artery anastomosis explored with subclavian artery transected and stopped sewn over in September 2011.   Patient has Negative history of TIA or stroke symptom.  The patient denies amaurosis fugax or monocular blindness.  The patient  denies facial drooping.  Pt. denies hemiplegia.  The patient denies receptive or expressive aphasia.  Pt. denies extremity weakness other than rarely in right arm  The patient's previous neurologic status is Unchanged.   denies New Medical or Surgical History.  Pt Diabetic: No Pt smoker: former smoker, quit 35 years ago  Pt meds include: Statin : No Betablocker: Yes ASA: Yes Other anticoagulants/antiplatelets: Plavix   Past Medical History  Diagnosis Date  . Hypertension   . Arthritis   . Joint pain   . Allergy   . GERD (gastroesophageal reflux disease)   . Thyroid disease   . Carotid artery occlusion     Social History History  Substance Use Topics  . Smoking status: Former Smoker -- 0.30 packs/day for 5 years    Types: Cigarettes    Quit date: 04/12/1980  . Smokeless tobacco: Never Used  . Alcohol Use: No    Family History Family History  Problem Relation Age of Onset  . Heart failure Mother   . Cancer Father     Surgical History Past Surgical History  Procedure Laterality Date  . Cataract extraction      bilateral  . Abdominal hysterectomy    . Carotid subclavian anastomosis  05/11/11    Right subclav. artery transected 12/17/09  . Eye surgery    . Carotid angiogram  July 04, 2012    No Known Allergies  Current Outpatient Prescriptions  Medication Sig Dispense Refill  . acetaminophen (TYLENOL) 325 MG tablet Take 325 mg by mouth every 6 (six) hours as needed. For pain      . aspirin 81 MG  chewable tablet Chew 81 mg by mouth daily.      . cetirizine (ZYRTEC) 10 MG tablet Take 10 mg by mouth daily as needed for allergies. For allergies      . clopidogrel (PLAVIX) 75 MG tablet Take 75 mg by mouth daily.      Marland Kitchen guaiFENesin (MUCINEX) 600 MG 12 hr tablet Take 600 mg by mouth 2 (two) times daily as needed for congestion. For cold symptoms      . levothyroxine (SYNTHROID, LEVOTHROID) 75 MCG tablet Take 75 mcg by mouth every other day.       . lisinopril (PRINIVIL,ZESTRIL) 10 MG tablet Take 10 mg by mouth daily.       . metoprolol succinate (TOPROL-XL) 25 MG 24 hr tablet Take 12.5 mg by mouth daily.       Marland Kitchen levothyroxine (SYNTHROID, LEVOTHROID) 50 MCG tablet Take 50 mcg by mouth every other day.       No current facility-administered medications for this visit.    Review of Systems : _0  Positive   _1  Denies  General:_2  Weight loss,  _3  Weight gain, _4  Loss of appetite, _5  Fever, _6  chills  Neurologic: _7  Dizziness, _8  Blackouts, _9  Headaches, _10  Seizure _11  Stroke, _12  "Mini stroke", _13  Slurred speech, _14  Temporary blindness;  _15 weakness,  Ear/Nose/Throat: _16  Change  in hearing, _0  Nose bleeds, _1  Hoarseness  Vascular:_2  Pain in legs with walking, _3  Pain in feet while lying flat , _4   Non-healing ulcer, _5  Blood clot in vein,    Pulmonary: _6  Home oxygen, _7   Productive cough, _8  Bronchitis, _9  Coughing up blood,  _10  Asthma, _11  Wheezing  Musculoskeletal:  Valu.Nieves ] Arthritis, _12  Joint pain, _13  low back pain  Cardiac: _14  Chest pain, _15  Shortness of breath when lying flat, _16  Shortness of breath with exertion, _17  Palpitations, _18  Heart murmur, _19   Atrial fibrillation  Hematologic:[X ] Easy Bruising, _20  Anemia; _21  Hepatitis  Psychiatric: _22   Depression, _23  Anxiety   Gastrointestinal: _24  Black stool, _25  Blood in stool, _26  Peptic ulcer disease,  _27  Gastroesophageal Reflux, _28  Trouble swallowing, _29  Diarrhea, _30  Constipation  Urinary: _31   chronic Kidney disease, _32  on HD, _33  Burning with urination, _34  Frequent urination, _35  Difficulty urinating;   Skin: _36  Rashes, _37  Wounds    Physical Examination Filed Vitals:   01/02/13 1409  BP: 136/78  Pulse: 78  Resp:    Filed Weights   01/02/13 1405  Weight: 143 lb (64.864 kg)   Body mass index is 25.34 kg/(m^2).  General: WDWN female in NAD GAIT: normal Eyes: PERRLA Pulmonary:  CTAB, Negative  Rales, Negative rhonchi, & Negative wheezing.  Cardiac: regular Rhythm ,  Positive soft murmur.  VASCULAR EXAM Carotid Bruits Left Right   Positive, soft Positive, harsh        Radial pulses: left is weakly palpable, right is 2+ palpable. Brachial pulses: 2+ palpable bilaterally.                                                                                                                        LE Pulses LEFT RIGHT       POPLITEAL  not palpable   not palpable       POSTERIOR TIBIAL  not palpable   not palpable        DORSALIS PEDIS      ANTERIOR TIBIAL  palpable   palpable     Gastrointestinal: soft, nontender, BS WNL, no r/g,  negative masses.  Musculoskeletal: Negative muscle atrophy/wasting. M/S 5/5 throughout, Extremities without ischemic changes.  Neurologic: A&O X 3; Appropriate Affect ; SENSATION ;normal;  Speech is normalCN 2-12 intact  except slightly hard of hearing, Pain and light touch intact in extremities, Motor exam as listed above.   Non-Invasive Vascular Imaging CAROTID DUPLEX 01/02/2013  Less than 40% stenosis of bilateral ICA's, unchanged from same study 6 months prior. Velocity of 378 cm/sec noted at the right subclavian/common carotid artery anastomosis.  Previous angiogram:March, 2014: #1 narrowing within the previously deployed stent which is similar to the initial deployment pictures. This is due to external compression from  a calcified plaque. No evidence of intimal hyperplasia is identified. Only a 5 mm gradient was recorded across  the concerned area.  #2 the carotid subclavian transposition anastomosis is widely patent.    Assessment: [name] is a 72 y.o. female  Who is status post right common carotid artery stenting in January 2013 with a history of her right carotid subclavian artery anastomosis explored with subclavian artery transected and stopped sewn over in September 2011. She is asymptomatic with less than 40% bilateral ICA stenosis. The  ICA stenosis is  Unchanged from previous exam. A harsh right carotid bruit is auscultated, most likely secondary to a velocity of 378 cm/sec noted at the right subclavian/common carotid artery anastomosis; will follow again in 6 months with carotid Duplex and velocity check of the right subclavian/common carotid artery anastomosis. She has no steal symptoms in her right UE other than rare weakness.  Plan: Based on today's exam and Duplex results, and after discussing with Dr. Kellie Simmering, advised the patient to follow-up in 6 months with Carotid Duplex scan.  I discussed in depth with the patient the nature of atherosclerosis, and emphasized the importance of maximal medical management including strict control of blood pressure, blood glucose, and lipid levels, obtaining regular exercise, and continued cessation of smoking.  The patient is aware that without maximal medical management the underlying atherosclerotic disease process will progress, limiting the benefit of any interventions. The patient was given information about stroke prevention and what symptoms should prompt the patient to seek immediate medical care. Thank you for allowing [place] to participate in this patient's care.  Clemon Chambers, RN, MSN, FNP-C [facility] Office: [facility] Physician: Kellie Simmering  01/02/2013 2:03 PM  VASCULAR QUALITY INITIATIVE FOLLOW UP DATA:  Current smoker: [  ] yes  [ X ] no  Living status: [ X ]  Home  [  ] Nursing home  [  ] Homeless     MEDS:  ASA Valu.Nieves  ] yes  [  ] no- [  ] medical reason  [  ] non compliant  STATIN  [  ] yes  [ X ] no- Valu.Nieves  ] medical reason  [  ] non compliant  Beta blocker [ X ] yes  [  ] no- [  ] medical reason  [  ] non compliant  ACE inhibitor Valu.Nieves  ] yes  [  ] no- [  ] medical reason  [  ] non compliant  P2Y12 Antagonist [  ] none  Valu.Nieves  ] clopidogrel-Plavix  [  ] ticlopidine-Ticlid   [  ] prasugrel-Effient  [  ] ticagrelor- Brilinta    Anticoagulant Valu.Nieves  ] None  [  ] warfarin  [  ] rivaroxaban-Xarelto [  ] dabigatran- Pradaxa  Neurologic event since D/C:  [ X ] no  [  ] yes: [  ] eye event  [  ] cortical event  [  ] VB event  [  ] non specific event  [  ] right  [  ] left  [  ] TIA  [  ] stroke  Date:   Modified Rankin Score: 0

## 2013-01-03 NOTE — Addendum Note (Signed)
Addended by: Peter Minium K on: 01/03/2013 11:00 AM   Modules accepted: Orders

## 2013-01-16 ENCOUNTER — Other Ambulatory Visit (HOSPITAL_COMMUNITY): Payer: Self-pay | Admitting: Nurse Practitioner

## 2013-01-16 DIAGNOSIS — Z1231 Encounter for screening mammogram for malignant neoplasm of breast: Secondary | ICD-10-CM

## 2013-02-19 ENCOUNTER — Ambulatory Visit (HOSPITAL_COMMUNITY)
Admission: RE | Admit: 2013-02-19 | Discharge: 2013-02-19 | Disposition: A | Discharge status: Home / Self Care | Payer: Medicare Other | Source: Ambulatory Visit | Attending: Nurse Practitioner | Admitting: Nurse Practitioner

## 2013-02-19 DIAGNOSIS — Z1231 Encounter for screening mammogram for malignant neoplasm of breast: Secondary | ICD-10-CM

## 2013-05-18 ENCOUNTER — Other Ambulatory Visit: Payer: Self-pay | Admitting: Family

## 2013-05-18 DIAGNOSIS — Z48812 Encounter for surgical aftercare following surgery on the circulatory system: Secondary | ICD-10-CM

## 2013-05-18 DIAGNOSIS — I6529 Occlusion and stenosis of unspecified carotid artery: Secondary | ICD-10-CM

## 2013-06-27 ENCOUNTER — Emergency Department (HOSPITAL_COMMUNITY)
Admission: EM | Admit: 2013-06-27 | Discharge: 2013-06-27 | Disposition: A | Discharge status: Home / Self Care | Payer: Medicare Other | Attending: Emergency Medicine | Admitting: Emergency Medicine

## 2013-06-27 ENCOUNTER — Encounter (HOSPITAL_COMMUNITY): Payer: Self-pay | Admitting: Emergency Medicine

## 2013-06-27 DIAGNOSIS — M25559 Pain in unspecified hip: Secondary | ICD-10-CM | POA: Insufficient documentation

## 2013-06-27 DIAGNOSIS — M1990 Unspecified osteoarthritis, unspecified site: Secondary | ICD-10-CM

## 2013-06-27 DIAGNOSIS — Z7902 Long term (current) use of antithrombotics/antiplatelets: Secondary | ICD-10-CM | POA: Insufficient documentation

## 2013-06-27 DIAGNOSIS — Z7982 Long term (current) use of aspirin: Secondary | ICD-10-CM | POA: Insufficient documentation

## 2013-06-27 DIAGNOSIS — Z8719 Personal history of other diseases of the digestive system: Secondary | ICD-10-CM | POA: Insufficient documentation

## 2013-06-27 DIAGNOSIS — M549 Dorsalgia, unspecified: Secondary | ICD-10-CM

## 2013-06-27 DIAGNOSIS — M545 Low back pain, unspecified: Secondary | ICD-10-CM | POA: Insufficient documentation

## 2013-06-27 DIAGNOSIS — Z79899 Other long term (current) drug therapy: Secondary | ICD-10-CM | POA: Insufficient documentation

## 2013-06-27 DIAGNOSIS — E079 Disorder of thyroid, unspecified: Secondary | ICD-10-CM | POA: Insufficient documentation

## 2013-06-27 DIAGNOSIS — M129 Arthropathy, unspecified: Secondary | ICD-10-CM | POA: Insufficient documentation

## 2013-06-27 DIAGNOSIS — I10 Essential (primary) hypertension: Secondary | ICD-10-CM | POA: Insufficient documentation

## 2013-06-27 DIAGNOSIS — Z87891 Personal history of nicotine dependence: Secondary | ICD-10-CM | POA: Insufficient documentation

## 2013-06-27 MED ORDER — PREDNISONE 20 MG PO TABS: 40.0000 mg | ORAL_TABLET | Freq: Every day | ORAL | Status: DC

## 2013-06-27 NOTE — ED Notes (Signed)
Pt and family given sprites and meals

## 2013-06-27 NOTE — Discharge Instructions (Signed)
Back Pain, Adult °Low back pain is very common. About 1 in 5 people have back pain. The cause of low back pain is rarely dangerous. The pain often gets better over time. About half of people with a sudden onset of back pain feel better in just 2 weeks. About 8 in 10 people feel better by 6 weeks.  °CAUSES °Some common causes of back pain include: °· Strain of the muscles or ligaments supporting the spine. °· Wear and tear (degeneration) of the spinal discs. °· Arthritis. °· Direct injury to the back. °DIAGNOSIS °Most of the time, the direct cause of low back pain is not known. However, back pain can be treated effectively even when the exact cause of the pain is unknown. Answering your caregiver's questions about your overall health and symptoms is one of the most accurate ways to make sure the cause of your pain is not dangerous. If your caregiver needs more information, he or she may order lab work or imaging tests (X-rays or MRIs). However, even if imaging tests show changes in your back, this usually does not require surgery. °HOME CARE INSTRUCTIONS °For many people, back pain returns. Since low back pain is rarely dangerous, it is often a condition that people can learn to manage on their own.  °· Remain active. It is stressful on the back to sit or stand in one place. Do not sit, drive, or stand in one place for more than 30 minutes at a time. Take short walks on level surfaces as soon as pain allows. Try to increase the length of time you walk each day. °· Do not stay in bed. Resting more than 1 or 2 days can delay your recovery. °· Do not avoid exercise or work. Your body is made to move. It is not dangerous to be active, even though your back may hurt. Your back will likely heal faster if you return to being active before your pain is gone. °· Pay attention to your body when you  bend and lift. Many people have less discomfort when lifting if they bend their knees, keep the load close to their bodies, and  avoid twisting. Often, the most comfortable positions are those that put less stress on your recovering back. °· Find a comfortable position to sleep. Use a firm mattress and lie on your side with your knees slightly bent. If you lie on your back, put a pillow under your knees. °· Only take over-the-counter or prescription medicines as directed by your caregiver. Over-the-counter medicines to reduce pain and inflammation are often the most helpful. Your caregiver may prescribe muscle relaxant drugs. These medicines help dull your pain so you can more quickly return to your normal activities and healthy exercise. °· Put ice on the injured area. °· Put ice in a plastic bag. °· Place a towel between your skin and the bag. °· Leave the ice on for 15-20 minutes, 03-04 times a day for the first 2 to 3 days. After that, ice and heat may be alternated to reduce pain and spasms. °· Ask your caregiver about trying back exercises and gentle massage. This may be of some benefit. °· Avoid feeling anxious or stressed. Stress increases muscle tension and can worsen back pain. It is important to recognize when you are anxious or stressed and learn ways to manage it. Exercise is a great option. °SEEK MEDICAL CARE IF: °· You have pain that is not relieved with rest or medicine. °· You have pain that does not improve in 1 week. °· You have new symptoms. °· You are generally not feeling well. °SEEK   IMMEDIATE MEDICAL CARE IF:   You have pain that radiates from your back into your legs.  You develop new bowel or bladder control problems.  You have unusual weakness or numbness in your arms or legs.  You develop nausea or vomiting.  You develop abdominal pain.  You feel faint. Document Released: 03/29/2005 Document Revised: 09/28/2011 Document Reviewed: 08/17/2010 [employer] Patient Information 2014 [employer].  

## 2013-06-27 NOTE — ED Provider Notes (Deleted)
Medical screening examination/treatment/procedure(s) were performed by non-physician practitioner and as supervising physician I was immediately available for consultation/collaboration.  Richarda Blade, MD 06/27/13 2008

## 2013-06-27 NOTE — ED Notes (Signed)
Pt c/o back pain x 1 week.  Pt states she was taking Tylenol which helped a little, but pain has been getting worse.  Pt states she is unable to get out of bed d/t the back pain.

## 2013-06-27 NOTE — ED Provider Notes (Signed)
  Face-to-face evaluation   History: She's had low back pain for one week. That "moves around." She occasionally has pain in the left leg to the toes. It feels like a burning sensation. She's taking Tylenol without relief. She's never had imaging of the low back.  Physical exam: Elderly, alert, in mild discomfort. Mild right lumbar tenderness to palpation. She is able to sit on the stretcher without significant pain  Medical screening examination/treatment/procedure(s) were conducted as a shared visit with non-physician practitioner(s) and myself.  I personally evaluated the patient during the encounter  Richarda Blade, MD 06/28/13 [phone]

## 2013-06-27 NOTE — ED Notes (Signed)
Patient ambulated slowly but seemed steady. Walked utilizing my hands for support.

## 2013-06-27 NOTE — ED Notes (Signed)
Patient ambulated in hallway by Grayland Ormond EMT, slow, steady gait

## 2013-06-27 NOTE — ED Provider Notes (Signed)
CSN: [id number]     Arrival date & time 06/27/13  4098 History   First MD Initiated Contact with Patient 06/27/13 1130     Chief Complaint  Patient presents with  . Back Pain     (Consider location/radiation/quality/duration/timing/severity/associated sxs/prior Treatment) HPI Comments: Patient presents emergency department with chief complaint of back pain.  The pain is mostly located in the right lower back and hip.  She denies any falls or other MOI.  She states the pain is worse in the morning. It is also worsened with cold weather. She is tried taking Tylenol with some relief. She denies fevers, chills, nausea, vomiting, chest pain, shortness of breath, diarrhea, constipation, dysuria, or numbness or weakness of the extremities. She states the pain improves as she goes about her day.  The history is provided by the patient. No language interpreter was used.    Past Medical History  Diagnosis Date  . Hypertension   . Arthritis   . Joint pain   . Allergy   . GERD (gastroesophageal reflux disease)   . Thyroid disease   . Carotid artery occlusion    Past Surgical History  Procedure Laterality Date  . Cataract extraction      bilateral  . Abdominal hysterectomy    . Carotid subclavian anastomosis  05/11/11    Right subclav. artery transected 12/17/09  . Eye surgery    . Carotid angiogram  July 04, 2012   Family History  Problem Relation Age of Onset  . Heart failure Mother   . Cancer Father    History  Substance Use Topics  . Smoking status: Former Smoker -- 0.30 packs/day for 5 years    Types: Cigarettes    Quit date: 04/12/1980  . Smokeless tobacco: Never Used  . Alcohol Use: No   OB History   Grav Para Term Preterm Abortions TAB SAB Ect Mult Living                 Review of Systems  Constitutional: Negative for fever and chills.  Gastrointestinal:       No bowel incontinence  Genitourinary:       No urinary incontinence  Musculoskeletal: Positive for  arthralgias, back pain and myalgias.  Neurological:       No saddle anesthesia      Allergies  Review of patient's allergies indicates no known allergies.  Home Medications   Current Outpatient Rx  Name  Route  Sig  Dispense  Refill  . acetaminophen (TYLENOL) 325 MG tablet   Oral   Take 325 mg by mouth every 6 (six) hours as needed. For pain         . aspirin 81 MG chewable tablet   Oral   Chew 81 mg by mouth daily.         . cetirizine (ZYRTEC) 10 MG tablet   Oral   Take 10 mg by mouth daily as needed for allergies. For allergies         . clopidogrel (PLAVIX) 75 MG tablet   Oral   Take 75 mg by mouth daily.         Marland Kitchen guaiFENesin (MUCINEX) 600 MG 12 hr tablet   Oral   Take 600 mg by mouth 2 (two) times daily as needed for congestion. For cold symptoms         . levothyroxine (SYNTHROID, LEVOTHROID) 50 MCG tablet   Oral   Take 50 mcg by mouth every other day.         Marland Kitchen  levothyroxine (SYNTHROID, LEVOTHROID) 75 MCG tablet   Oral   Take 75 mcg by mouth every other day.          . lisinopril (PRINIVIL,ZESTRIL) 10 MG tablet   Oral   Take 10 mg by mouth daily.          . metoprolol succinate (TOPROL-XL) 25 MG 24 hr tablet   Oral   Take 12.5 mg by mouth daily.           BP 153/54  Pulse 88  Temp(Src) 98 F (36.7 C) (Oral)  Resp 17  Ht _0  (1.575 m)  Wt 143 lb (64.864 kg)  BMI 26.15 kg/m2  SpO2 99% Physical Exam  Nursing note and vitals reviewed. Constitutional: She is oriented to person, place, and time. She appears well-developed and well-nourished. No distress.  HENT:  Head: Normocephalic and atraumatic.  Eyes: Conjunctivae and EOM are normal. Right eye exhibits no discharge. Left eye exhibits no discharge. No scleral icterus.  Neck: Normal range of motion. Neck supple. No tracheal deviation present.  Cardiovascular: Normal rate, regular rhythm and normal heart sounds.  Exam reveals no gallop and no friction rub.   No murmur  heard. Pulmonary/Chest: Effort normal and breath sounds normal. No respiratory distress. She has no wheezes.  Abdominal: Soft. She exhibits no distension. There is no tenderness.  Musculoskeletal: Normal range of motion.  No bony tenderness, step-offs, or gross abnormality or deformity of spine, patient is able to ambulate, moves all extremities  Bilateral great toe extension intact Bilateral plantar/dorsiflexion intact  Neurological: She is alert and oriented to person, place, and time. She has normal reflexes.  Sensation and strength intact bilaterally Symmetrical reflexes  Skin: Skin is warm. She is not diaphoretic.  Psychiatric: She has a normal mood and affect. Her behavior is normal. Judgment and thought content normal.    ED Course  Procedures (including critical care time) Labs Review Labs Reviewed - No data to display Imaging Review No results found.   EKG Interpretation None      MDM   Final diagnoses:  Back pain  Arthritis   Patient with back pain and arthritis. Patient seen by and discussed with Dr. Eulis Foster, who recommends discharge with prednisone. Patient is ambulatory. She understands and agrees with the plan. She is stable and ready for discharge.    Montine Circle, PA-C 06/27/13 1423

## 2013-07-01 ENCOUNTER — Encounter (HOSPITAL_COMMUNITY): Payer: Self-pay | Admitting: Emergency Medicine

## 2013-07-01 ENCOUNTER — Emergency Department (HOSPITAL_COMMUNITY): Payer: Medicare Other

## 2013-07-01 ENCOUNTER — Emergency Department (HOSPITAL_COMMUNITY)
Admission: EM | Admit: 2013-07-01 | Discharge: 2013-07-01 | Disposition: A | Discharge status: Home / Self Care | Payer: Medicare Other | Attending: Emergency Medicine | Admitting: Emergency Medicine

## 2013-07-01 DIAGNOSIS — M8448XA Pathological fracture, other site, initial encounter for fracture: Secondary | ICD-10-CM | POA: Insufficient documentation

## 2013-07-01 DIAGNOSIS — IMO0002 Reserved for concepts with insufficient information to code with codable children: Secondary | ICD-10-CM | POA: Insufficient documentation

## 2013-07-01 DIAGNOSIS — E079 Disorder of thyroid, unspecified: Secondary | ICD-10-CM | POA: Insufficient documentation

## 2013-07-01 DIAGNOSIS — M4856XA Collapsed vertebra, not elsewhere classified, lumbar region, initial encounter for fracture: Secondary | ICD-10-CM

## 2013-07-01 DIAGNOSIS — I10 Essential (primary) hypertension: Secondary | ICD-10-CM | POA: Insufficient documentation

## 2013-07-01 DIAGNOSIS — M545 Low back pain, unspecified: Secondary | ICD-10-CM

## 2013-07-01 DIAGNOSIS — R350 Frequency of micturition: Secondary | ICD-10-CM | POA: Insufficient documentation

## 2013-07-01 DIAGNOSIS — Z79899 Other long term (current) drug therapy: Secondary | ICD-10-CM | POA: Insufficient documentation

## 2013-07-01 DIAGNOSIS — Z7982 Long term (current) use of aspirin: Secondary | ICD-10-CM | POA: Insufficient documentation

## 2013-07-01 DIAGNOSIS — Z8719 Personal history of other diseases of the digestive system: Secondary | ICD-10-CM | POA: Insufficient documentation

## 2013-07-01 DIAGNOSIS — Z87891 Personal history of nicotine dependence: Secondary | ICD-10-CM | POA: Insufficient documentation

## 2013-07-01 DIAGNOSIS — M129 Arthropathy, unspecified: Secondary | ICD-10-CM | POA: Insufficient documentation

## 2013-07-01 LAB — URINALYSIS, ROUTINE W REFLEX MICROSCOPIC
Bilirubin Urine: NEGATIVE
Glucose, UA: NEGATIVE mg/dL
Hgb urine dipstick: NEGATIVE
Ketones, ur: NEGATIVE mg/dL
NITRITE: NEGATIVE
PROTEIN: NEGATIVE mg/dL
Specific Gravity, Urine: 1.023 (ref 1.005–1.030)
UROBILINOGEN UA: 0.2 mg/dL (ref 0.0–1.0)
pH: 6 (ref 5.0–8.0)

## 2013-07-01 LAB — URINE MICROSCOPIC-ADD ON

## 2013-07-01 MED ORDER — DOCUSATE SODIUM 100 MG PO CAPS: 100.0000 mg | ORAL_CAPSULE | Freq: Every day | ORAL | Status: DC | PRN

## 2013-07-01 MED ORDER — HYDROCODONE-ACETAMINOPHEN 5-325 MG PO TABS
1.0000 | ORAL_TABLET | Freq: Once | ORAL | Status: AC
  Administered 2013-07-01: 1 via ORAL
  Filled 2013-07-01: qty 1

## 2013-07-01 MED ORDER — HYDROCODONE-ACETAMINOPHEN 5-325 MG PO TABS: 1.0000 | ORAL_TABLET | Freq: Four times a day (QID) | ORAL | Status: DC | PRN

## 2013-07-01 NOTE — ED Notes (Signed)
PT reports back pain on the rt side. Pt reports cleaning base boards and may have pulled a muscle. Pt also reports frequent urination .

## 2013-07-01 NOTE — ED Notes (Signed)
Patient transported to X-ray

## 2013-07-01 NOTE — ED Notes (Signed)
Pt assisted to BR to collect Urine sample. Pt unable to void at this time. [name] notifie.

## 2013-07-01 NOTE — ED Provider Notes (Signed)
CSN: [id number]     Arrival date & time 07/01/13  9167 History   First MD Initiated Contact with Patient 07/01/13 [phone]     No chief complaint on file.    (Consider location/radiation/quality/duration/timing/severity/associated sxs/prior Treatment) The history is provided by the patient.   Pt with right low back pain x 2 weeks.  Pain is 8/10 intensity, worse with bending and moving, does not radiate.  She has also had urinary frequency about the same length of time.  Pt is very active and does a lot of housework, cleaning, and walking - denies any known injury or trauma. Denies fevers, chills, abdominal pain, loss of control of bowel or bladder, saddle anesthesia, weakness or numbness of the extremities.  Pt was seen in ED for same 06/28/13, given prednisone, which she states has helped a little.  Pt has been constipated for the past 3-4 days but states she also has not been eating much due to lack of appetite.  Has been drinking plenty of water.   Past Medical History  Diagnosis Date  . Hypertension   . Arthritis   . Joint pain   . Allergy   . GERD (gastroesophageal reflux disease)   . Thyroid disease   . Carotid artery occlusion    Past Surgical History  Procedure Laterality Date  . Cataract extraction      bilateral  . Abdominal hysterectomy    . Carotid subclavian anastomosis  05/11/11    Right subclav. artery transected 12/17/09  . Eye surgery    . Carotid angiogram  July 04, 2012   Family History  Problem Relation Age of Onset  . Heart failure Mother   . Cancer Father    History  Substance Use Topics  . Smoking status: Former Smoker -- 0.30 packs/day for 5 years    Types: Cigarettes    Quit date: 04/12/1980  . Smokeless tobacco: Never Used  . Alcohol Use: No   OB History   Grav Para Term Preterm Abortions TAB SAB Ect Mult Living                 Review of Systems  Constitutional: Negative for fever.  Respiratory: Negative for cough and shortness of breath.    Cardiovascular: Negative for chest pain.  Gastrointestinal: Negative for nausea, vomiting, abdominal pain and diarrhea.  Genitourinary: Positive for frequency. Negative for dysuria and urgency.  Musculoskeletal: Positive for back pain.  Neurological: Negative for weakness and numbness.  All other systems reviewed and are negative.      Allergies  Review of patient's allergies indicates no known allergies.  Home Medications   Current Outpatient Rx  Name  Route  Sig  Dispense  Refill  . acetaminophen (TYLENOL) 325 MG tablet   Oral   Take 325 mg by mouth every 6 (six) hours as needed for mild pain or headache. For pain         . aspirin 81 MG chewable tablet   Oral   Chew 81 mg by mouth daily.         . calcium-vitamin D (OSCAL WITH D) 500-200 MG-UNIT per tablet   Oral   Take 1 tablet by mouth daily with breakfast.         . cetirizine (ZYRTEC) 10 MG tablet   Oral   Take 10 mg by mouth daily as needed for allergies. For allergies         . clopidogrel (PLAVIX) 75 MG tablet   Oral  Take 75 mg by mouth daily.         . fluticasone (FLONASE) 50 MCG/ACT nasal spray   Each Nare   Place 1 spray into both nostrils daily as needed for allergies or rhinitis.         Marland Kitchen levothyroxine (SYNTHROID, LEVOTHROID) 75 MCG tablet   Oral   Take 75 mcg by mouth daily before breakfast.          . lisinopril (PRINIVIL,ZESTRIL) 10 MG tablet   Oral   Take 10 mg by mouth daily.          . metoprolol succinate (TOPROL-XL) 25 MG 24 hr tablet   Oral   Take 12.5 mg by mouth daily.          . Multiple Vitamins-Minerals (ALIVE WOMENS ENERGY) TABS   Oral   Take 1 tablet by mouth daily.         . predniSONE (DELTASONE) 20 MG tablet   Oral   Take 40 mg by mouth daily.         . Probiotic Product (PROBIOTIC PO)   Oral   Take 1 capsule by mouth daily.          BP 150/69  Pulse 69  Temp(Src) 98.3 F (36.8 C) (Oral)  Resp 13  Ht _0  (1.575 m)  Wt 142 lb  (64.411 kg)  BMI 25.97 kg/m2  SpO2 99% Physical Exam  Nursing note and vitals reviewed. Constitutional: She appears well-developed and well-nourished. No distress.  HENT:  Head: Normocephalic and atraumatic.  Eyes: Conjunctivae are normal.  Neck: Normal range of motion. Neck supple.  Cardiovascular: Normal rate, regular rhythm and intact distal pulses.   Pulmonary/Chest: Effort normal and breath sounds normal. No respiratory distress. She has no wheezes. She has no rales.  Abdominal: Soft. She exhibits no distension and no mass. There is no tenderness. There is no rebound and no guarding.  Musculoskeletal: She exhibits no edema.  Lower extremities:  Strength 5/5, sensation intact, distal pulses intact.     Neurological: She is alert.  Skin: She is not diaphoretic.  Psychiatric: She has a normal mood and affect. Her behavior is normal. Thought content normal.    ED Course  Procedures (including critical care time) Labs Review Labs Reviewed  URINALYSIS, ROUTINE W REFLEX MICROSCOPIC - Abnormal; Notable for the following:    Leukocytes, UA TRACE (*)    All other components within normal limits  URINE MICROSCOPIC-ADD ON - Abnormal; Notable for the following:    Squamous Epithelial / LPF FEW (*)    All other components within normal limits  URINE CULTURE   Imaging Review Dg Lumbar Spine Complete  07/01/2013   CLINICAL DATA:  Low back injury, right-sided back pain  EXAM: LUMBAR SPINE - COMPLETE 4+ VIEW  COMPARISON:  CT abdomen pelvis dated 04/18/2008  FINDINGS: Five lumbar type vertebral bodies.  Normal lumbar lordosis.  Mild superior endplate compression fracture deformity involving the L2 vertebral body, age indeterminate but new from 2010. No retropulsion.  Mild multilevel degenerative changes.  Visualized bony pelvis appears intact.  IMPRESSION: Mild superior endplate compression fracture deformity involving the L2 vertebral body, age indeterminate but new from 2010. No retropulsion.   Correlate with the site of the patient's pain.   Electronically Signed   By: Julian Hy M.D.   On: 07/01/2013 08:33     EKG Interpretation None      Filed Vitals:   07/01/13 0900  BP: 136/56  Pulse:  58  Temp:   Resp:     Discussed safety with pain medication with patient.    Patient discussed with Dr Venora Maples.   MDM   Final diagnoses:  Low back pain  Compression fracture of lumbar spine, non-traumatic    Pt with right lower back pain that exists only with movement.  No trauma. Also with urinary frequency. No red flags. Compression fracture of indeterminate age on xray.  UA negative, culture sent.  D/C home with norco, laxative by request.  Discussed safety with pain medications to avoid falls.  Discussed result, findings, treatment, and follow up  with patient.  Pt given return precautions.  Pt verbalizes understanding and agrees with plan.        Clayton Bibles, PA-C 07/01/13 1314

## 2013-07-01 NOTE — Discharge Instructions (Signed)
Read the information below.  Use the prescribed medication as directed.  Please discuss all new medications with your pharmacist.  Do not take additional tylenol while taking the prescribed pain medication to avoid overdose.  You may return to the Emergency Department at any time for worsening condition or any new symptoms that concern you.   If you develop fevers, loss of control of bowel or bladder, weakness or numbness in your legs, or are unable to walk, return to the ER for a recheck.    Back Injury Prevention Back injuries can be extremely painful and difficult to heal. After having one back injury, you are much more likely to experience another later on. It is important to learn how to avoid injuring or re-injuring your back. The following tips can help you to prevent a back injury. PHYSICAL FITNESS  Exercise regularly and try to develop good tone in your abdominal muscles. Your abdominal muscles provide a lot of the support needed by your back.  Do aerobic exercises (walking, jogging, biking, swimming) regularly.  Do exercises that increase balance and strength (tai chi, yoga) regularly. This can decrease your risk of falling and injuring your back.  Stretch before and after exercising.  Maintain a healthy weight. The more you weigh, the more stress is placed on your back. For every pound of weight, 10 times that amount of pressure is placed on the back. DIET  Talk to your caregiver about how much calcium and vitamin D you need per day. These nutrients help to prevent weakening of the bones (osteoporosis). Osteoporosis can cause broken (fractured) bones that lead to back pain.  Include good sources of calcium in your diet, such as dairy products, green, leafy vegetables, and products with calcium added (fortified).  Include good sources of vitamin D in your diet, such as milk and foods that are fortified with vitamin D.  Consider taking a nutritional supplement or a multivitamin if  needed.  Stop smoking if you smoke. POSTURE  Sit and stand up straight. Avoid leaning forward when you sit or hunching over when you stand.  Choose chairs with good low back (lumbar) support.  If you work at a desk, sit close to your work so you do not need to lean over. Keep your chin tucked in. Keep your neck drawn back and elbows bent at a right angle. Your arms should look like the letter "L."  Sit high and close to the steering wheel when you drive. Add a lumbar support to your car seat if needed.  Avoid sitting or standing in one position for too long. Take breaks to get up, stretch, and walk around at least once every hour. Take breaks if you are driving for long periods of time.  Sleep on your side with your knees slightly bent, or sleep on your back with a pillow under your knees. Do not sleep on your stomach. LIFTING, TWISTING, AND REACHING  Avoid heavy lifting, especially repetitive lifting. If you must do heavy lifting:  Stretch before lifting.  Work slowly.  Rest between lifts.  Use carts and dollies to move objects when possible.  Make several small trips instead of carrying 1 heavy load.  Ask for help when you need it.  Ask for help when moving big, awkward objects.  Follow these steps when lifting:  Stand with your feet shoulder-width apart.  Get as close to the object as you can. Do not try to pick up heavy objects that are far from your  body.  Use handles or lifting straps if they are available.  Bend at your knees. Squat down, but keep your heels off the floor.  Keep your shoulders pulled back, your chin tucked in, and your back straight.  Lift the object slowly, tightening the muscles in your legs, abdomen, and buttocks. Keep the object as close to the center of your body as possible.  When you put a load down, use these same guidelines in reverse.  Do not:  Lift the object above your waist.  Twist at the waist while lifting or carrying a  load. Move your feet if you need to turn, not your waist.  Bend over without bending at your knees.  Avoid reaching over your head, across a table, or for an object on a high surface. OTHER TIPS  Avoid wet floors and keep sidewalks clear of ice to prevent falls.  Do not sleep on a mattress that is too soft or too hard.  Keep items that are used frequently within easy reach.  Put heavier objects on shelves at waist level and lighter objects on lower or higher shelves.  Find ways to decrease your stress, such as exercise, massage, or relaxation techniques. Stress can build up in your muscles. Tense muscles are more vulnerable to injury.  Seek treatment for depression or anxiety if needed. These conditions can increase your risk of developing back pain. SEEK MEDICAL CARE IF:  You injure your back.  You have questions about diet, exercise, or other ways to prevent back injuries. MAKE SURE YOU:  Understand these instructions.  Will watch your condition.  Will get help right away if you are not doing well or get worse. Document Released: 05/06/2004 Document Revised: 06/21/2011 Document Reviewed: 05/10/2011 [facility] Patient Information 2014 [place], [place].  Back Pain, Adult Low back pain is very common. About 1 in 5 people have back pain.The cause of low back pain is rarely dangerous. The pain often gets better over time.About half of people with a sudden onset of back pain feel better in just 2 weeks. About 8 in 10 people feel better by 6 weeks.  CAUSES Some common causes of back pain include:  Strain of the muscles or ligaments supporting the spine.  Wear and tear (degeneration) of the spinal discs.  Arthritis.  Direct injury to the back. DIAGNOSIS Most of the time, the direct cause of low back pain is not known.However, back pain can be treated effectively even when the exact cause of the pain is unknown.Answering your caregiver's questions about your overall health and  symptoms is one of the most accurate ways to make sure the cause of your pain is not dangerous. If your caregiver needs more information, he or she may order lab work or imaging tests (X-rays or MRIs).However, even if imaging tests show changes in your back, this usually does not require surgery. HOME CARE INSTRUCTIONS For many people, back pain returns.Since low back pain is rarely dangerous, it is often a condition that people can learn to [facility] their own.   Remain active. It is stressful on the back to sit or stand in one place. Do not sit, drive, or stand in one place for more than 30 minutes at a time. Take short walks on level surfaces as soon as pain allows.Try to increase the length of time you walk each day.  Do not stay in bed.Resting more than 1 or 2 days can delay your recovery.  Do not avoid exercise or work.Your body  is made to move.It is not dangerous to be active, even though your back may hurt.Your back will likely heal faster if you return to being active before your pain is gone.  Pay attention to your body when you bend and lift. Many people have less discomfortwhen lifting if they bend their knees, keep the load close to their bodies,and avoid twisting. Often, the most comfortable positions are those that put less stress on your recovering back.  Find a comfortable position to sleep. Use a firm mattress and lie on your side with your knees slightly bent. If you lie on your back, put a pillow under your knees.  Only take over-the-counter or prescription medicines as directed by your caregiver. Over-the-counter medicines to reduce pain and inflammation are often the most helpful.Your caregiver may prescribe muscle relaxant drugs.These medicines help dull your pain so you can more quickly return to your normal activities and healthy exercise.  Put ice on the injured area.  Put ice in a plastic bag.  Place a towel between your skin and the bag.  Leave the ice on  for 15-20 minutes, 03-04 times a day for the first 2 to 3 days. After that, ice and heat may be alternated to reduce pain and spasms.  Ask your caregiver about trying back exercises and gentle massage. This may be of some benefit.  Avoid feeling anxious or stressed.Stress increases muscle tension and can worsen back pain.It is important to recognize when you are anxious or stressed and learn ways to manage it.Exercise is a great option. SEEK MEDICAL CARE IF:  You have pain that is not relieved with rest or medicine.  You have pain that does not improve in 1 week.  You have new symptoms.  You are generally not feeling well. SEEK IMMEDIATE MEDICAL CARE IF:   You have pain that radiates from your back into your legs.  You develop new bowel or bladder control problems.  You have unusual weakness or numbness in your arms or legs.  You develop nausea or vomiting.  You develop abdominal pain.  You feel faint. Document Released: 03/29/2005 Document Revised: 09/28/2011 Document Reviewed: 08/17/2010 [facility] Patient Information 2014 [place], [place].

## 2013-07-02 ENCOUNTER — Encounter: Payer: Self-pay | Admitting: Family

## 2013-07-02 LAB — URINE CULTURE
Colony Count: NO GROWTH
Culture: NO GROWTH

## 2013-07-03 ENCOUNTER — Ambulatory Visit: Payer: Medicare Other | Admitting: Family

## 2013-07-03 ENCOUNTER — Other Ambulatory Visit (HOSPITAL_COMMUNITY): Payer: Medicare Other

## 2013-07-03 NOTE — ED Provider Notes (Signed)
Medical screening examination/treatment/procedure(s) were performed by non-physician practitioner and as supervising physician I was immediately available for consultation/collaboration.   EKG Interpretation None        Hoy Morn, MD 07/03/13 [phone]

## 2013-07-16 ENCOUNTER — Encounter: Payer: Self-pay | Admitting: Family

## 2013-07-17 ENCOUNTER — Ambulatory Visit: Payer: Medicare Other | Admitting: Family

## 2013-07-17 ENCOUNTER — Ambulatory Visit (HOSPITAL_COMMUNITY)
Admission: RE | Admit: 2013-07-17 | Discharge: 2013-07-17 | Disposition: A | Discharge status: Home / Self Care | Payer: Medicare Other | Source: Ambulatory Visit | Attending: Family | Admitting: Family

## 2013-07-17 DIAGNOSIS — Z48812 Encounter for surgical aftercare following surgery on the circulatory system: Secondary | ICD-10-CM

## 2013-07-17 DIAGNOSIS — I6529 Occlusion and stenosis of unspecified carotid artery: Secondary | ICD-10-CM

## 2013-07-19 ENCOUNTER — Other Ambulatory Visit: Payer: Self-pay

## 2013-07-19 DIAGNOSIS — I6529 Occlusion and stenosis of unspecified carotid artery: Secondary | ICD-10-CM

## 2013-07-19 NOTE — Patient Instructions (Signed)
Dear [name],  Your last Duplex stent test shows NO significant change when compared to the previous exam June 12, 2012.  Please follow up in 6 months.      Stroke Prevention Some medical conditions and behaviors are associated with an increased chance of having a stroke. You may prevent a stroke by making healthy choices and managing medical conditions. HOW CAN I REDUCE MY RISK OF HAVING A STROKE?   Stay physically active. Get at least 30 minutes of activity on most or all days.  Do not smoke. It may also be helpful to avoid exposure to secondhand smoke.  Limit alcohol use. Moderate alcohol use is considered to be:  No more than 2 drinks per day for men.  No more than 1 drink per day for nonpregnant women.  Eat healthy foods. This involves  Eating 5 or more servings of fruits and vegetables a day.  Following a diet that addresses high blood pressure (hypertension), high cholesterol, diabetes, or obesity.  Manage your cholesterol levels.  A diet low in saturated fat, trans fat, and cholesterol and high in fiber may control cholesterol levels.  Take any prescribed medicines to control cholesterol as directed by your health care provider.  Manage your diabetes.  A controlled-carbohydrate, controlled-sugar diet is recommended to manage diabetes.  Take any prescribed medicines to control diabetes as directed by your health care provider.  Control your hypertension.  A low-salt (sodium), low-saturated fat, low-trans fat, and low-cholesterol diet is recommended to manage hypertension.  Take any prescribed medicines to control hypertension as directed by your health care provider.  Maintain a healthy weight.  A reduced-calorie, low-sodium, low-saturated fat, low-trans fat, low-cholesterol diet is recommended to manage weight.  Stop drug abuse.  Avoid taking birth control pills.  Talk to your health care provider about the risks of taking birth control pills if you are  over 43 years old, smoke, get migraines, or have ever had a blood clot.  Get evaluated for sleep disorders (sleep apnea).  Talk to your health care provider about getting a sleep evaluation if you snore a lot or have excessive sleepiness.  Take medicines as directed by your health care provider.  For some people, aspirin or blood thinners (anticoagulants) are helpful in reducing the risk of forming abnormal blood clots that can lead to stroke. If you have the irregular heart rhythm of atrial fibrillation, you should be on a blood thinner unless there is a good reason you cannot take them.  Understand all your medicine instructions.  Make sure that other other conditions (such as anemia or atherosclerosis) are addressed. SEEK IMMEDIATE MEDICAL CARE IF:   You have sudden weakness or numbness of the face, arm, or leg, especially on one side of the body.  Your face or eyelid droops to one side.  You have sudden confusion.  You have trouble speaking (aphasia) or understanding.  You have sudden trouble seeing in one or both eyes.  You have sudden trouble walking.  You have dizziness.  You have a loss of balance or coordination.  You have a sudden, severe headache with no known cause.  You have new chest pain or an irregular heartbeat. Any of these symptoms may represent a serious problem that is an emergency. Do not wait to see if the symptoms will go away. Get medical help at once. Call your local emergency services  (911 in [place] Do not drive yourself to the hospital. Document Released: 05/06/2004 Document Revised: 01/17/2013 Document Reviewed:  09/29/2012 [employer] Patient Information 2014 [place].

## 2013-07-20 ENCOUNTER — Encounter: Payer: Self-pay | Admitting: Vascular Surgery

## 2013-07-20 NOTE — Progress Notes (Signed)
Lab only 

## 2013-11-30 ENCOUNTER — Emergency Department (HOSPITAL_COMMUNITY)
Admission: EM | Admit: 2013-11-30 | Discharge: 2013-11-30 | Disposition: A | Discharge status: Home / Self Care | Payer: Medicare Other | Attending: Emergency Medicine | Admitting: Emergency Medicine

## 2013-11-30 ENCOUNTER — Encounter (HOSPITAL_COMMUNITY): Payer: Self-pay | Admitting: Emergency Medicine

## 2013-11-30 DIAGNOSIS — M129 Arthropathy, unspecified: Secondary | ICD-10-CM | POA: Insufficient documentation

## 2013-11-30 DIAGNOSIS — R221 Localized swelling, mass and lump, neck: Secondary | ICD-10-CM | POA: Diagnosis present

## 2013-11-30 DIAGNOSIS — E079 Disorder of thyroid, unspecified: Secondary | ICD-10-CM | POA: Insufficient documentation

## 2013-11-30 DIAGNOSIS — Z8719 Personal history of other diseases of the digestive system: Secondary | ICD-10-CM | POA: Diagnosis not present

## 2013-11-30 DIAGNOSIS — I6529 Occlusion and stenosis of unspecified carotid artery: Secondary | ICD-10-CM | POA: Diagnosis not present

## 2013-11-30 DIAGNOSIS — R220 Localized swelling, mass and lump, head: Secondary | ICD-10-CM | POA: Insufficient documentation

## 2013-11-30 DIAGNOSIS — Z7982 Long term (current) use of aspirin: Secondary | ICD-10-CM | POA: Insufficient documentation

## 2013-11-30 DIAGNOSIS — Z79899 Other long term (current) drug therapy: Secondary | ICD-10-CM | POA: Diagnosis not present

## 2013-11-30 DIAGNOSIS — IMO0002 Reserved for concepts with insufficient information to code with codable children: Secondary | ICD-10-CM | POA: Insufficient documentation

## 2013-11-30 DIAGNOSIS — I10 Essential (primary) hypertension: Secondary | ICD-10-CM | POA: Diagnosis not present

## 2013-11-30 DIAGNOSIS — Z87891 Personal history of nicotine dependence: Secondary | ICD-10-CM | POA: Diagnosis not present

## 2013-11-30 NOTE — ED Provider Notes (Signed)
CSN: [id number]     Arrival date & time 11/30/13  0404 History   First MD Initiated Contact with Patient 11/30/13 [phone]     Chief Complaint  Patient presents with  . Facial Swelling    The patient said she has been having facial swelling for two months.  She has seen a doctor for the swelling and was told it was her sinuses.      (Consider location/radiation/quality/duration/timing/severity/associated sxs/prior Treatment) HPI Comments: 73 year old female with coronary artery disease history presents with facial swelling for the past 2 months. Patient saw her primary Dr. and her dentist has told him I be her sinuses. Patient had her dentures replaced with no significant improvement. Patient denies fevers, headaches, heart failure, kidney or liver disease or other infectious symptoms. Patient feels well otherwise. The swelling is mild and nothing specifically worsens or improves. No recent medications or new medicines, no prednisone use. No current antibiotics.  The history is provided by the patient.    Past Medical History  Diagnosis Date  . Hypertension   . Arthritis   . Joint pain   . Allergy   . GERD (gastroesophageal reflux disease)   . Thyroid disease   . Carotid artery occlusion    Past Surgical History  Procedure Laterality Date  . Cataract extraction      bilateral  . Abdominal hysterectomy    . Carotid subclavian anastomosis  05/11/11    Right subclav. artery transected 12/17/09  . Eye surgery    . Carotid angiogram  July 04, 2012   Family History  Problem Relation Age of Onset  . Heart failure Mother   . Cancer Father    History  Substance Use Topics  . Smoking status: Former Smoker -- 0.30 packs/day for 5 years    Types: Cigarettes    Quit date: 04/12/1980  . Smokeless tobacco: Never Used  . Alcohol Use: No   OB History   Grav Para Term Preterm Abortions TAB SAB Ect Mult Living                 Review of Systems  Constitutional: Negative for fever and  chills.  HENT: Positive for facial swelling. Negative for congestion.   Respiratory: Negative for shortness of breath.   Cardiovascular: Negative for chest pain.  Gastrointestinal: Negative for vomiting and abdominal pain.  Genitourinary: Negative for dysuria and flank pain.  Musculoskeletal: Negative for back pain, neck pain and neck stiffness.  Skin: Negative for rash.  Neurological: Negative for light-headedness and headaches.      Allergies  Review of patient's allergies indicates no known allergies.  Home Medications   Prior to Admission medications   Medication Sig Start Date End Date Taking? Authorizing Provider  acetaminophen (TYLENOL) 325 MG tablet Take 325 mg by mouth every 6 (six) hours as needed for mild pain or headache. For pain   Yes Historical Provider, MD  aspirin 81 MG chewable tablet Chew 81 mg by mouth daily.   Yes Historical Provider, MD  calcium-vitamin D (OSCAL WITH D) 500-200 MG-UNIT per tablet Take 1 tablet by mouth daily with breakfast.   Yes Historical Provider, MD  cetirizine (ZYRTEC) 10 MG tablet Take 10 mg by mouth daily as needed for allergies. For allergies   Yes Historical Provider, MD  clopidogrel (PLAVIX) 75 MG tablet Take 75 mg by mouth daily.   Yes Historical Provider, MD  fluticasone (FLONASE) 50 MCG/ACT nasal spray Place 1 spray into both nostrils daily as needed for  allergies or rhinitis.   Yes Historical Provider, MD  levothyroxine (SYNTHROID, LEVOTHROID) 75 MCG tablet Take 75 mcg by mouth daily before breakfast.    Yes Historical Provider, MD  lisinopril (PRINIVIL,ZESTRIL) 10 MG tablet Take 10 mg by mouth daily.    Yes Historical Provider, MD  metoprolol succinate (TOPROL-XL) 25 MG 24 hr tablet Take 12.5 mg by mouth daily.    Yes Historical Provider, MD  Multiple Vitamins-Minerals (ALIVE WOMENS ENERGY) TABS Take 1 tablet by mouth daily.   Yes Historical Provider, MD  Probiotic Product (PROBIOTIC PO) Take 1 capsule by mouth daily.   Yes  Historical Provider, MD   BP 161/48  Pulse 81  Temp(Src) 97.8 F (36.6 C) (Oral)  Resp 16  SpO2 100% Physical Exam  Nursing note and vitals reviewed. Constitutional: She is oriented to person, place, and time. She appears well-developed and well-nourished.  HENT:  Head: Normocephalic and atraumatic.  No trismus, uvular deviation, unilateral posterior pharyngeal edema or submandibular swelling. No significant facial swelling appreciated. No maxillary sinus tenderness.  Eyes: Conjunctivae are normal. Right eye exhibits no discharge. Left eye exhibits no discharge.  Neck: Normal range of motion. Neck supple. No tracheal deviation present.  Cardiovascular: Normal rate and regular rhythm.   Pulmonary/Chest: Effort normal.  Abdominal: Soft.  Musculoskeletal: She exhibits no edema.  Neurological: She is alert and oriented to person, place, and time.  Skin: Skin is warm. No rash noted.  Psychiatric: She has a normal mood and affect.    ED Course  Procedures (including critical care time) Labs Review Labs Reviewed - No data to display  Imaging Review No results found.   EKG Interpretation None      MDM   Final diagnoses:  Facial swelling   Well-appearing female with no history of fluid overload, no significant findings on exam..  no significant swelling or findings on exam. No edema in the legs or arms. Reassured patient for close outpatient followup. Family and patient comfortable the plan. Discussed trial of warm salt solution for sinus drainage.  Results and differential diagnosis were discussed with the patient/parent/guardian. Close follow up outpatient was discussed, comfortable with the plan.   Medications - No data to display  Filed Vitals:   11/30/13 0412  BP: 161/48  Pulse: 81  Temp: 97.8 F (36.6 C)  TempSrc: Oral  Resp: 16  SpO2: 100%       Mariea Clonts, MD 11/30/13 [phone]

## 2013-11-30 NOTE — ED Notes (Signed)
The patient said she has been having facial swelling for two months.  She has seen a doctor for the swelling and was told it was her sinuses.   She also said she had some dentures done and they are not fitting her right.  She denies fever.  She advised me that she is here because the swelling has gotten worse and is now in pain.

## 2013-11-30 NOTE — Discharge Instructions (Signed)
If you were given medicines take as directed.  If you are on coumadin or contraceptives realize their levels and effectiveness is altered by many different medicines.  If you have any reaction (rash, tongues swelling, other) to the medicines stop taking and see a physician.   Please follow up as directed and return to the ER or see a physician for new or worsening symptoms.  Thank you. Filed Vitals:   11/30/13 0412  BP: 161/48  Pulse: 81  Temp: 97.8 F (36.6 C)  TempSrc: Oral  Resp: 16  SpO2: 100%

## 2014-01-21 ENCOUNTER — Encounter: Payer: Self-pay | Admitting: Family

## 2014-01-22 ENCOUNTER — Ambulatory Visit (INDEPENDENT_AMBULATORY_CARE_PROVIDER_SITE_OTHER): Payer: Medicare Other | Admitting: Family

## 2014-01-22 ENCOUNTER — Ambulatory Visit (HOSPITAL_COMMUNITY)
Admission: RE | Admit: 2014-01-22 | Discharge: 2014-01-22 | Disposition: A | Discharge status: Home / Self Care | Payer: Medicare Other | Source: Ambulatory Visit | Attending: Family | Admitting: Family

## 2014-01-22 ENCOUNTER — Encounter: Payer: Self-pay | Admitting: Family

## 2014-01-22 VITALS — BP 124/74 | HR 74 | Resp 16 | Ht 63.0 in | Wt 149.0 lb

## 2014-01-22 DIAGNOSIS — Z48812 Encounter for surgical aftercare following surgery on the circulatory system: Secondary | ICD-10-CM | POA: Insufficient documentation

## 2014-01-22 DIAGNOSIS — I6523 Occlusion and stenosis of bilateral carotid arteries: Secondary | ICD-10-CM

## 2014-01-22 DIAGNOSIS — I6529 Occlusion and stenosis of unspecified carotid artery: Secondary | ICD-10-CM | POA: Insufficient documentation

## 2014-01-22 NOTE — Progress Notes (Signed)
Established Carotid Patient   History of Present Illness  [name] is a 73 y.o. female patient of Dr. Kellie Simmering who is s/p right carotid to subclavian anastomosis 12/17/2009 and right proximal common carotid artery stent 05/11/2011. Patient has Negative history of TIA or stroke symptom. The patient denies amaurosis fugax or monocular blindness. The patient denies facial drooping.  Pt. denies hemiplegia. The patient denies receptive or expressive aphasia. Pt. denies extremity weakness other than rarely in right arm   Pt denies New Medical or Surgical History.   Pt Diabetic: No  Pt smoker: former smoker, quit 35 years ago   She walks 2 miles daily, she denies claudication symptoms with walking, denies non healing wounds.  Pt meds include:  Statin : No  Betablocker: Yes  ASA: Yes  Other anticoagulants/antiplatelets: Plavix   Past Medical History  Diagnosis Date  . Hypertension   . Arthritis   . Joint pain   . Allergy   . GERD (gastroesophageal reflux disease)   . Thyroid disease   . Carotid artery occlusion     Social History History  Substance Use Topics  . Smoking status: Former Smoker -- 0.30 packs/day for 5 years    Types: Cigarettes    Quit date: 04/12/1980  . Smokeless tobacco: Never Used  . Alcohol Use: No    Family History Family History  Problem Relation Age of Onset  . Heart failure Mother   . Cancer Father     Surgical History Past Surgical History  Procedure Laterality Date  . Cataract extraction      bilateral  . Abdominal hysterectomy    . Carotid subclavian anastomosis  05/11/11    Right subclav. artery transected 12/17/09  . Eye surgery    . Carotid angiogram  July 04, 2012    No Known Allergies  Current Outpatient Prescriptions  Medication Sig Dispense Refill  . acetaminophen (TYLENOL) 325 MG tablet Take 325 mg by mouth every 6 (six) hours as needed for mild pain or headache. For pain      . aspirin 81 MG chewable tablet Chew 81 mg by  mouth daily.      . calcium-vitamin D (OSCAL WITH D) 500-200 MG-UNIT per tablet Take 1 tablet by mouth daily with breakfast.      . cetirizine (ZYRTEC) 10 MG tablet Take 10 mg by mouth daily as needed for allergies. For allergies      . clopidogrel (PLAVIX) 75 MG tablet Take 75 mg by mouth daily.      Marland Kitchen docusate sodium (COLACE) 100 MG capsule Take 100 mg by mouth. Prn      . fluticasone (FLONASE) 50 MCG/ACT nasal spray Place 1 spray into both nostrils daily as needed for allergies or rhinitis.      . fluticasone (FLONASE) 50 MCG/ACT nasal spray as needed.      Marland Kitchen levothyroxine (SYNTHROID, LEVOTHROID) 75 MCG tablet Take 75 mcg by mouth daily before breakfast.       . lisinopril (PRINIVIL,ZESTRIL) 10 MG tablet Take 10 mg by mouth daily.       . metoprolol succinate (TOPROL-XL) 25 MG 24 hr tablet Take 12.5 mg by mouth daily.       . Multiple Vitamins-Minerals (ALIVE WOMENS ENERGY) TABS Take 1 tablet by mouth daily.      . Probiotic Product (PROBIOTIC PO) Take 1 capsule by mouth daily.       No current facility-administered medications for this visit.    Review of Systems :  See HPI for pertinent positives and negatives.  Physical Examination  Filed Vitals:   01/22/14 1453 01/22/14 1456  BP: 139/72 124/74  Pulse: 78 74  Resp:  16  Height:  _0  (1.6 m)  Weight:  149 lb (67.586 kg)  SpO2:  98%   Body mass index is 26.4 kg/(m^2).  General: WDWN female in NAD  GAIT: normal  Eyes: PERRLA  Pulmonary: CTAB, Negative Rales, Negative rhonchi, & Negative wheezing.  Cardiac: regular Rhythm , Positive soft murmur.   VASCULAR EXAM  Carotid Bruits  Left  Right    Positive, soft  Positive, harsh    Radial pulses: right is weakly palpable, left is 2+ palpable.  Brachial pulses: 2+ palpable bilaterally.   LE Pulses  LEFT  RIGHT   POPLITEAL  not palpable  not palpable   POSTERIOR TIBIAL  not palpable   palpable   DORSALIS PEDIS  ANTERIOR TIBIAL  palpable  palpable    Gastrointestinal:  soft, nontender, BS WNL, no r/g, no masses palpated.  Musculoskeletal: Negative muscle atrophy/wasting. M/S 5/5 throughout, Extremities without ischemic changes.  Neurologic: A&O X 3; Appropriate Affect ; SENSATION ;normal;  Speech is normalCN 2-12 intact except slightly hard of hearing, Pain and light touch intact in extremities, Motor exam as listed above.   Non-Invasive Vascular Imaging CAROTID DUPLEX 01/22/2014 CEREBROVASCULAR DUPLEX EVALUATION     INDICATION: Carotid artery disease; evaluation status post stent placement.    PREVIOUS INTERVENTION(S): Right carotid to subclavian anastomosis 12/17/2009. Right proximal common carotid artery stent 05/11/2011.    DUPLEX EXAM:     RIGHT  LEFT  Peak Systolic Velocities (cm/s) End Diastolic Velocities (cm/s) Plaque LOCATION Peak Systolic Velocities (cm/s) End Diastolic Velocities (cm/s) Plaque  204 28  CCA PROXIMAL 143 23 HT  140 25 HT CCA MID 156 29 HT  113 24 HT CCA DISTAL 148 25   130 13 HT ECA 138 16 HT  87 17 HT ICA PROXIMAL 57 18   80 24  ICA MID 81 19   61 15  ICA DISTAL 78 19      ICA / CCA Ratio (PSV) 0.51  Antegrade Vertebral Flow Antegrade   675 Brachial Systolic Pressure (mmHg) 916  Triphasic  Brachial Artery Waveforms Triphasic     Peak Systolic Velocities (cm/s) End Diastolic Velocities (cm/s) Plaque STENT (R CCA) Peak Systolic Velocities (cm/s) End Diastolic Velocities (cm/s) Plaque  323 30  PROXIMAL        MID        DISTAL       Plaque Morphology:  HM = Homogeneous, HT = Heterogeneous, CP = Calcific Plaque, SP = Smooth Plaque, IP = Irregular Plaque      IMPRESSION: Bilateral internal carotid artery velocities suggest a <40% stenosis.  Elevated velocities present involving the right proximal common carotid artery/innominate artery stent anastomosis. No plaque or hyperplasia present.    Compared to the previous exam:  No significant change in comparison to the last exam on 07/17/2013.    Assessment: BRITIANY [name] is a 73 y.o. female who is s/p right carotid to subclavian anastomosis 12/17/2009 and right proximal common carotid artery stent 05/11/2011. She  presents with asymptomatic bilateral internal carotid artery velocities suggest a <40% stenosis.  Elevated velocities present involving the right proximal common carotid artery/innominate artery stent anastomosis. No plaque or hyperplasia present. No significant change in comparison to the last exam on 07/17/2013.  On 01/02/13: Velocity of 378 cm/sec noted at the right  subclavian/common carotid artery anastomosis, is 323 cm/s today.  Plan: Follow-up in 1 year year with Carotid Duplex scan.   I discussed in depth with the patient the nature of atherosclerosis, and emphasized the importance of maximal medical management including strict control of blood pressure, blood glucose, and lipid levels, obtaining regular exercise, and continued cessation of smoking.  The patient is aware that without maximal medical management the underlying atherosclerotic disease process will progress, limiting the benefit of any interventions. The patient was given information about stroke prevention and what symptoms should prompt the patient to seek immediate medical care. Thank you for allowing [place] to participate in this patient's care.  Clemon Chambers, RN, MSN, FNP-C [facility] Office: [phone]  Clinic Physician: Kellie Simmering  01/22/2014 3:04 PM

## 2014-01-22 NOTE — Addendum Note (Signed)
Addended by: Mena Goes on: 01/22/2014 03:56 PM   Modules accepted: Orders

## 2014-01-22 NOTE — Patient Instructions (Signed)
Stroke Prevention Some medical conditions and behaviors are associated with an increased chance of having a stroke. You may prevent a stroke by making healthy choices and managing medical conditions. HOW CAN I REDUCE MY RISK OF HAVING A STROKE?   Stay physically active. Get at least 30 minutes of activity on most or all days.  Do not smoke. It may also be helpful to avoid exposure to secondhand smoke.  Limit alcohol use. Moderate alcohol use is considered to be:  No more than 2 drinks per day for men.  No more than 1 drink per day for nonpregnant women.  Eat healthy foods. This involves:  Eating 5 or more servings of fruits and vegetables a day.  Making dietary changes that address high blood pressure (hypertension), high cholesterol, diabetes, or obesity.  Manage your cholesterol levels.  Making food choices that are high in fiber and low in saturated fat, trans fat, and cholesterol may control cholesterol levels.  Take any prescribed medicines to control cholesterol as directed by your health care provider.  Manage your diabetes.  Controlling your carbohydrate and sugar intake is recommended to manage diabetes.  Take any prescribed medicines to control diabetes as directed by your health care provider.  Control your hypertension.  Making food choices that are low in salt (sodium), saturated fat, trans fat, and cholesterol is recommended to manage hypertension.  Take any prescribed medicines to control hypertension as directed by your health care provider.  Maintain a healthy weight.  Reducing calorie intake and making food choices that are low in sodium, saturated fat, trans fat, and cholesterol are recommended to manage weight.  Stop drug abuse.  Avoid taking birth control pills.  Talk to your health care provider about the risks of taking birth control pills if you are over 24 years old, smoke, get migraines, or have ever had a blood clot.  Get evaluated for sleep  disorders (sleep apnea).  Talk to your health care provider about getting a sleep evaluation if you snore a lot or have excessive sleepiness.  Take medicines only as directed by your health care provider.  For some people, aspirin or blood thinners (anticoagulants) are helpful in reducing the risk of forming abnormal blood clots that can lead to stroke. If you have the irregular heart rhythm of atrial fibrillation, you should be on a blood thinner unless there is a good reason you cannot take them.  Understand all your medicine instructions.  Make sure that other conditions (such as anemia or atherosclerosis) are addressed. SEEK IMMEDIATE MEDICAL CARE IF:   You have sudden weakness or numbness of the face, arm, or leg, especially on one side of the body.  Your face or eyelid droops to one side.  You have sudden confusion.  You have trouble speaking (aphasia) or understanding.  You have sudden trouble seeing in one or both eyes.  You have sudden trouble walking.  You have dizziness.  You have a loss of balance or coordination.  You have a sudden, severe headache with no known cause.  You have new chest pain or an irregular heartbeat. Any of these symptoms may represent a serious problem that is an emergency. Do not wait to see if the symptoms will go away. Get medical help at once. Call your local emergency services (911 in [place] Do not drive yourself to the hospital. Document Released: 05/06/2004 Document Revised: 08/13/2013 Document Reviewed: 09/29/2012 [facility] Patient Information 2015 [place], [place]. This information is not intended to replace advice given  to you by your health care provider. Make sure you discuss any questions you have with your health care provider.  

## 2014-03-21 ENCOUNTER — Encounter (HOSPITAL_COMMUNITY): Payer: Self-pay | Admitting: Surgery

## 2014-04-18 ENCOUNTER — Other Ambulatory Visit (HOSPITAL_COMMUNITY): Payer: Self-pay | Admitting: Family Medicine

## 2014-04-18 DIAGNOSIS — Z1231 Encounter for screening mammogram for malignant neoplasm of breast: Secondary | ICD-10-CM

## 2014-04-23 ENCOUNTER — Ambulatory Visit (HOSPITAL_COMMUNITY)
Admission: RE | Admit: 2014-04-23 | Discharge: 2014-04-23 | Disposition: A | Discharge status: Home / Self Care | Payer: Medicare Other | Source: Ambulatory Visit | Attending: Family Medicine | Admitting: Family Medicine

## 2014-04-23 DIAGNOSIS — Z1231 Encounter for screening mammogram for malignant neoplasm of breast: Secondary | ICD-10-CM | POA: Insufficient documentation

## 2014-05-23 ENCOUNTER — Other Ambulatory Visit (HOSPITAL_COMMUNITY): Payer: Self-pay | Admitting: Family Medicine

## 2014-05-23 DIAGNOSIS — M810 Age-related osteoporosis without current pathological fracture: Secondary | ICD-10-CM

## 2014-05-28 ENCOUNTER — Ambulatory Visit (HOSPITAL_COMMUNITY)
Admission: RE | Admit: 2014-05-28 | Discharge: 2014-05-28 | Disposition: A | Discharge status: Home / Self Care | Payer: Medicare Other | Source: Ambulatory Visit | Attending: Family Medicine | Admitting: Family Medicine

## 2014-05-28 DIAGNOSIS — Z780 Asymptomatic menopausal state: Secondary | ICD-10-CM | POA: Diagnosis not present

## 2014-05-28 DIAGNOSIS — Z13820 Encounter for screening for osteoporosis: Secondary | ICD-10-CM | POA: Insufficient documentation

## 2014-05-28 DIAGNOSIS — M810 Age-related osteoporosis without current pathological fracture: Secondary | ICD-10-CM

## 2015-01-13 ENCOUNTER — Other Ambulatory Visit: Payer: Self-pay | Admitting: *Deleted

## 2015-01-13 DIAGNOSIS — I6523 Occlusion and stenosis of bilateral carotid arteries: Secondary | ICD-10-CM

## 2015-01-23 ENCOUNTER — Encounter: Payer: Self-pay | Admitting: Family

## 2015-01-28 ENCOUNTER — Ambulatory Visit (HOSPITAL_COMMUNITY)
Admission: RE | Admit: 2015-01-28 | Discharge: 2015-01-28 | Disposition: A | Discharge status: Home / Self Care | Payer: Medicare Other | Source: Ambulatory Visit | Attending: Family | Admitting: Family

## 2015-01-28 ENCOUNTER — Ambulatory Visit (INDEPENDENT_AMBULATORY_CARE_PROVIDER_SITE_OTHER): Payer: Medicare Other | Admitting: Family

## 2015-01-28 ENCOUNTER — Encounter: Payer: Self-pay | Admitting: Family

## 2015-01-28 VITALS — BP 135/70 | HR 77 | Temp 97.4°F | Resp 14 | Ht 62.0 in | Wt 146.0 lb

## 2015-01-28 DIAGNOSIS — Z959 Presence of cardiac and vascular implant and graft, unspecified: Secondary | ICD-10-CM

## 2015-01-28 DIAGNOSIS — I6523 Occlusion and stenosis of bilateral carotid arteries: Secondary | ICD-10-CM | POA: Insufficient documentation

## 2015-01-28 DIAGNOSIS — I10 Essential (primary) hypertension: Secondary | ICD-10-CM | POA: Diagnosis not present

## 2015-01-28 NOTE — Progress Notes (Signed)
Established Carotid Patient   History of Present Illness  [name] is a 74 y.o. female patient of Dr. 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 She has no arm claudication symptoms since this procedure. 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 Trula Slade 05/11/2011.  She returns today for follow up.  The patient denies any history of TIA or stroke symptoms. Specifically the patient denies a history of amaurosis fugax or monocular blindness, unilateral facial drooping, hemiplegia, or receptive or expressive aphasia.   Pt denies New Medical or Surgical History.   Pt Diabetic: No  Pt smoker: former smoker, quit about 1982 She walks 2 miles daily, she denies claudication symptoms with walking, denies non healing wounds.  Pt meds include:  Statin : No  Betablocker: Yes  ASA: Yes  Other anticoagulants/antiplatelets: Plavix   Past Medical History  Diagnosis Date  . Hypertension   . Arthritis   . Joint pain   . Allergy   . GERD (gastroesophageal reflux disease)   . Thyroid disease   . Carotid artery occlusion     Social History Social History  Substance Use Topics  . Smoking status: Former Smoker -- 0.30 packs/day for 5 years    Types: Cigarettes    Quit date: 04/12/1980  . Smokeless tobacco: Never Used  . Alcohol Use: No    Family History Family History  Problem Relation Age of Onset  . Heart failure Mother   . Cancer Father     Surgical History Past Surgical History  Procedure Laterality Date  . Cataract extraction      bilateral  . Abdominal hysterectomy    . Carotid subclavian anastomosis  05/11/11    Right subclav. artery transected 12/17/09  . Eye surgery    . Carotid angiogram  July 04, 2012  . Arch aortogram N/A 05/04/2011    Procedure: ARCH AORTOGRAM;  Surgeon:  Serafina [name], MD;  Location: [facility] CATH LAB;  Service: Cardiovascular;  Laterality: N/A;  . Carotid angiogram N/A 05/04/2011    Procedure: CAROTID ANGIOGRAM;  Surgeon: Serafina [name], MD;  Location: [facility] CATH LAB;  Service: Cardiovascular;  Laterality: N/A;  . Carotid stent insertion Right 05/11/2011    Procedure: CAROTID STENT INSERTION;  Surgeon: Serafina [name], MD;  Location: [facility] CATH LAB;  Service: Cardiovascular;  Laterality: Right;  . Arch aortogram N/A 07/04/2012    Procedure: ARCH AORTOGRAM;  Surgeon: Serafina [name], MD;  Location: [facility] CATH LAB;  Service: Cardiovascular;  Laterality: N/A;  . Carotid angiogram N/A 07/04/2012    Procedure: CAROTID ANGIOGRAM;  Surgeon: Serafina [name], MD;  Location: [facility] CATH LAB;  Service: Cardiovascular;  Laterality: N/A;    No Known Allergies  Current Outpatient Prescriptions  Medication Sig Dispense Refill  . acetaminophen (TYLENOL) 325 MG tablet Take 325 mg by mouth every 6 (six) hours as needed for mild pain or headache. For pain    . aspirin 81 MG chewable tablet Chew 81 mg by mouth daily.    . calcium-vitamin D (OSCAL WITH D) 500-200 MG-UNIT per tablet Take 1 tablet by mouth daily with breakfast.    . cetirizine (ZYRTEC) 10 MG tablet Take 10 mg by mouth daily as needed for allergies. For allergies    . clopidogrel (PLAVIX) 75 MG tablet Take 75 mg by mouth daily.    Marland Kitchen  fluticasone (FLONASE) 50 MCG/ACT nasal spray Place 1 spray into both nostrils daily as needed for allergies or rhinitis.    . fluticasone (FLONASE) 50 MCG/ACT nasal spray as needed.    Marland Kitchen levothyroxine (SYNTHROID, LEVOTHROID) 75 MCG tablet Take 75 mcg by mouth daily before breakfast.     . lisinopril (PRINIVIL,ZESTRIL) 10 MG tablet Take 10 mg by mouth daily.     . metoprolol succinate (TOPROL-XL) 25 MG 24 hr tablet Take 12.5 mg by mouth daily.     . Multiple Vitamins-Minerals (ALIVE WOMENS ENERGY) TABS Take 1 tablet by mouth daily.    . Probiotic Product (PROBIOTIC PO) Take 1 capsule by  mouth daily.     No current facility-administered medications for this visit.    Review of Systems : See HPI for pertinent positives and negatives.  Physical Examination  Filed Vitals:   01/28/15 1557 01/28/15 1558 01/28/15 1559  BP: 162/66 132/73 135/70  Pulse: 78 77 77  Temp: 97.4 F (36.3 C)    Resp: 14    Height: _0  (1.575 m)    Weight: 146 lb (66.225 kg)    SpO2: 97%     Body mass index is 26.7 kg/(m^2).   General: WDWN female in NAD  GAIT: normal  Eyes: PERRLA  Pulmonary: CTAB, Negative Rales, Negative rhonchi, & Negative wheezing.  Cardiac: regular Rhythm , Positive soft murmur.   VASCULAR EXAM  Carotid Bruits  Left  Right    Positive, soft  Positive, harsh    Radial pulses: right is weakly palpable, left is 2+ palpable.  Brachial pulses: 2+ palpable bilaterally.   LE Pulses  LEFT  RIGHT   POPLITEAL  not palpable  not palpable   POSTERIOR TIBIAL  not palpable  palpable   DORSALIS PEDIS  ANTERIOR TIBIAL  palpable  palpable    Gastrointestinal: soft, nontender, BS WNL, no r/g, no masses palpated.  Musculoskeletal: Negative muscle atrophy/wasting. M/S 5/5 throughout, Extremities without ischemic changes.  Neurologic: A&O X 3; Appropriate Affect ; SENSATION ;normal;  Speech is normalCN 2-12 intact except slightly hard of hearing, Pain and light touch intact in extremities, Motor exam as listed above.         Non-Invasive Vascular Imaging CAROTID DUPLEX 01/28/2015   CEREBROVASCULAR DUPLEX EVALUATION     INDICATION: Carotid artery disease; evaluation status post stent placement.    PREVIOUS INTERVENTION(S): Right carotid to subclavian anastomosis 12/17/2009; Right proximal common carotid artery stent 05/11/2011    DUPLEX EXAM:     RIGHT  LEFT  Peak Systolic Velocities (cm/s) End Diastolic Velocities (cm/s) Plaque LOCATION Peak Systolic Velocities (cm/s) End Diastolic Velocities (cm/s) Plaque  154 23 - CCA PROXIMAL 134 20  -  123 24 - CCA MID 137 24 HT  118 25 HT CCA DISTAL 105 20 HT  93 10 - ECA 119 13 HT  64 15 HT ICA PROXIMAL 136 19 -  75 20 - ICA MID 98 23 -  112 35 - ICA DISTAL 101 26 -    N/A ICA / CCA Ratio (PSV) .99  Antegrade Vertebral Flow Antegrade  - Brachial Systolic Pressure (mmHg) -  Triphasic Brachial Artery Waveforms Triphasic    Peak Systolic Velocities (cm/s) End Diastolic Velocities (cm/s) Plaque STENT (  ) Peak Systolic Velocities (cm/s) End Diastolic Velocities (cm/s) Plaque  138 8  PROXIMAL        MID        DISTAL       Plaque Morphology:  HM =  Homogeneous, HT = Heterogeneous, CP = Calcific Plaque, SP = Smooth Plaque, IP = Irregular Plaque    ADDITIONAL FINDINGS:     IMPRESSION: 1. Less than 40% bilateral internal carotid artery stenosis 2. The right common carotid artery/innominate artery stent appears patent, difficult visualization    Compared to the previous exam:  Unable to obtain increased velocity in the right proximal stent      Assessment: [name] is a 74 y.o. 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 She has no arm claudication symptoms since this procedure. 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 Trula Slade 05/11/2011.  She has no history of stroke or TIA. She remains very active. Today's carotid duplex suggests less than 40% bilateral internal carotid artery stenosis. The right common carotid artery/innominate artery stent appears patent, difficult visualization.   Plan: Follow-up in 1 year with Carotid Duplex scan   I discussed in depth with the patient the nature of atherosclerosis, and emphasized the importance of maximal medical management including strict control of blood pressure, blood glucose, and lipid levels, obtaining regular exercise, and continued cessation  of smoking.  The patient is aware that without maximal medical management the underlying atherosclerotic disease process will progress, limiting the benefit of any interventions. The patient was given information about stroke prevention and what symptoms should prompt the patient to seek immediate medical care. Thank you for allowing [place] to participate in this patient's care.  Clemon Chambers, RN, MSN, FNP-C [facility] Office: [phone]  Clinic Physician: Kellie Simmering  01/28/2015 3:36 PM

## 2015-01-28 NOTE — Patient Instructions (Signed)
Stroke Prevention Some medical conditions and behaviors are associated with an increased chance of having a stroke. You may prevent a stroke by making healthy choices and managing medical conditions. HOW CAN I REDUCE MY RISK OF HAVING A STROKE?   Stay physically active. Get at least 30 minutes of activity on most or all days.  Do not smoke. It may also be helpful to avoid exposure to secondhand smoke.  Limit alcohol use. Moderate alcohol use is considered to be:  No more than 2 drinks per day for men.  No more than 1 drink per day for nonpregnant women.  Eat healthy foods. This involves:  Eating 5 or more servings of fruits and vegetables a day.  Making dietary changes that address high blood pressure (hypertension), high cholesterol, diabetes, or obesity.  Manage your cholesterol levels.  Making food choices that are high in fiber and low in saturated fat, trans fat, and cholesterol may control cholesterol levels.  Take any prescribed medicines to control cholesterol as directed by your health care provider.  Manage your diabetes.  Controlling your carbohydrate and sugar intake is recommended to manage diabetes.  Take any prescribed medicines to control diabetes as directed by your health care provider.  Control your hypertension.  Making food choices that are low in salt (sodium), saturated fat, trans fat, and cholesterol is recommended to manage hypertension.  Ask your health care provider if you need treatment to lower your blood pressure. Take any prescribed medicines to control hypertension as directed by your health care provider.  If you are 18-39 years of age, have your blood pressure checked every 3-5 years. If you are 40 years of age or older, have your blood pressure checked every year.  Maintain a healthy weight.  Reducing calorie intake and making food choices that are low in sodium, saturated fat, trans fat, and cholesterol are recommended to manage  weight.  Stop drug abuse.  Avoid taking birth control pills.  Talk to your health care provider about the risks of taking birth control pills if you are over 35 years old, smoke, get migraines, or have ever had a blood clot.  Get evaluated for sleep disorders (sleep apnea).  Talk to your health care provider about getting a sleep evaluation if you snore a lot or have excessive sleepiness.  Take medicines only as directed by your health care provider.  For some people, aspirin or blood thinners (anticoagulants) are helpful in reducing the risk of forming abnormal blood clots that can lead to stroke. If you have the irregular heart rhythm of atrial fibrillation, you should be on a blood thinner unless there is a good reason you cannot take them.  Understand all your medicine instructions.  Make sure that other conditions (such as anemia or atherosclerosis) are addressed. SEEK IMMEDIATE MEDICAL CARE IF:   You have sudden weakness or numbness of the face, arm, or leg, especially on one side of the body.  Your face or eyelid droops to one side.  You have sudden confusion.  You have trouble speaking (aphasia) or understanding.  You have sudden trouble seeing in one or both eyes.  You have sudden trouble walking.  You have dizziness.  You have a loss of balance or coordination.  You have a sudden, severe headache with no known cause.  You have new chest pain or an irregular heartbeat. Any of these symptoms may represent a serious problem that is an emergency. Do not wait to see if the symptoms will   go away. Get medical help at once. Call your local emergency services (911 in [place] Do not drive yourself to the hospital.   This information is not intended to replace advice given to you by your health care provider. Make sure you discuss any questions you have with your health care provider.   Document Released: 05/06/2004 Document Revised: 04/19/2014 Document Reviewed:  09/29/2012 Elsevier Interactive Patient Education [employer].

## 2015-02-03 NOTE — Addendum Note (Signed)
Addended by: Dorthula Rue L on: 02/03/2015 01:07 PM   Modules accepted: Orders

## 2015-05-08 DIAGNOSIS — H40013 Open angle with borderline findings, low risk, bilateral: Secondary | ICD-10-CM | POA: Diagnosis not present

## 2015-05-08 DIAGNOSIS — H05243 Constant exophthalmos, bilateral: Secondary | ICD-10-CM | POA: Diagnosis not present

## 2015-05-08 DIAGNOSIS — H0589 Other disorders of orbit: Secondary | ICD-10-CM | POA: Diagnosis not present

## 2015-05-08 DIAGNOSIS — Z961 Presence of intraocular lens: Secondary | ICD-10-CM | POA: Diagnosis not present

## 2015-05-26 ENCOUNTER — Other Ambulatory Visit: Payer: Self-pay

## 2015-05-26 DIAGNOSIS — Z1231 Encounter for screening mammogram for malignant neoplasm of breast: Secondary | ICD-10-CM

## 2015-06-06 ENCOUNTER — Ambulatory Visit
Admission: RE | Admit: 2015-06-06 | Discharge: 2015-06-06 | Disposition: A | Discharge status: Home / Self Care | Payer: Medicare Other | Source: Ambulatory Visit

## 2015-06-06 DIAGNOSIS — Z1231 Encounter for screening mammogram for malignant neoplasm of breast: Secondary | ICD-10-CM | POA: Diagnosis not present

## 2015-07-08 DIAGNOSIS — I10 Essential (primary) hypertension: Secondary | ICD-10-CM | POA: Diagnosis not present

## 2015-07-08 DIAGNOSIS — K5901 Slow transit constipation: Secondary | ICD-10-CM | POA: Diagnosis not present

## 2015-07-08 DIAGNOSIS — I779 Disorder of arteries and arterioles, unspecified: Secondary | ICD-10-CM | POA: Diagnosis not present

## 2015-07-08 DIAGNOSIS — H8110 Benign paroxysmal vertigo, unspecified ear: Secondary | ICD-10-CM | POA: Diagnosis not present

## 2015-07-08 DIAGNOSIS — E782 Mixed hyperlipidemia: Secondary | ICD-10-CM | POA: Diagnosis not present

## 2015-09-22 DIAGNOSIS — R1012 Left upper quadrant pain: Secondary | ICD-10-CM | POA: Diagnosis not present

## 2015-09-22 DIAGNOSIS — I10 Essential (primary) hypertension: Secondary | ICD-10-CM | POA: Diagnosis not present

## 2015-09-22 DIAGNOSIS — Z87891 Personal history of nicotine dependence: Secondary | ICD-10-CM | POA: Diagnosis not present

## 2015-09-22 DIAGNOSIS — R1032 Left lower quadrant pain: Secondary | ICD-10-CM | POA: Diagnosis not present

## 2015-09-22 DIAGNOSIS — R109 Unspecified abdominal pain: Secondary | ICD-10-CM | POA: Diagnosis not present

## 2015-09-22 DIAGNOSIS — K760 Fatty (change of) liver, not elsewhere classified: Secondary | ICD-10-CM | POA: Diagnosis not present

## 2015-09-22 DIAGNOSIS — Z7982 Long term (current) use of aspirin: Secondary | ICD-10-CM | POA: Insufficient documentation

## 2015-09-22 DIAGNOSIS — Z79899 Other long term (current) drug therapy: Secondary | ICD-10-CM | POA: Diagnosis not present

## 2015-09-23 ENCOUNTER — Emergency Department (HOSPITAL_COMMUNITY): Payer: Medicare Other

## 2015-09-23 ENCOUNTER — Emergency Department (HOSPITAL_COMMUNITY)
Admission: EM | Admit: 2015-09-23 | Discharge: 2015-09-23 | Disposition: A | Discharge status: Home / Self Care | Payer: Medicare Other | Attending: Emergency Medicine | Admitting: Emergency Medicine

## 2015-09-23 ENCOUNTER — Encounter (HOSPITAL_COMMUNITY): Payer: Self-pay | Admitting: *Deleted

## 2015-09-23 DIAGNOSIS — R109 Unspecified abdominal pain: Secondary | ICD-10-CM

## 2015-09-23 DIAGNOSIS — R1012 Left upper quadrant pain: Secondary | ICD-10-CM | POA: Diagnosis not present

## 2015-09-23 DIAGNOSIS — K760 Fatty (change of) liver, not elsewhere classified: Secondary | ICD-10-CM | POA: Diagnosis not present

## 2015-09-23 LAB — CBC
HCT: 35.1 % — ABNORMAL LOW (ref 36.0–46.0)
Hemoglobin: 11.1 g/dL — ABNORMAL LOW (ref 12.0–15.0)
MCH: 30 pg (ref 26.0–34.0)
MCHC: 31.6 g/dL (ref 30.0–36.0)
MCV: 94.9 fL (ref 78.0–100.0)
PLATELETS: 213 10*3/uL (ref 150–400)
RBC: 3.7 MIL/uL — ABNORMAL LOW (ref 3.87–5.11)
RDW: 14.8 % (ref 11.5–15.5)
WBC: 8.5 10*3/uL (ref 4.0–10.5)

## 2015-09-23 LAB — COMPREHENSIVE METABOLIC PANEL
ALK PHOS: 79 U/L (ref 38–126)
ALT: 14 U/L (ref 14–54)
AST: 23 U/L (ref 15–41)
Albumin: 3.8 g/dL (ref 3.5–5.0)
Anion gap: 7 (ref 5–15)
BUN: 13 mg/dL (ref 6–20)
CALCIUM: 9.5 mg/dL (ref 8.9–10.3)
CO2: 22 mmol/L (ref 22–32)
CREATININE: 0.84 mg/dL (ref 0.44–1.00)
Chloride: 111 mmol/L (ref 101–111)
Glucose, Bld: 126 mg/dL — ABNORMAL HIGH (ref 65–99)
Potassium: 3.6 mmol/L (ref 3.5–5.1)
Sodium: 140 mmol/L (ref 135–145)
Total Bilirubin: 0.6 mg/dL (ref 0.3–1.2)
Total Protein: 7 g/dL (ref 6.5–8.1)

## 2015-09-23 LAB — LIPASE, BLOOD: Lipase: 34 U/L (ref 11–51)

## 2015-09-23 LAB — URINALYSIS, ROUTINE W REFLEX MICROSCOPIC
Bilirubin Urine: NEGATIVE
GLUCOSE, UA: NEGATIVE mg/dL
HGB URINE DIPSTICK: NEGATIVE
KETONES UR: NEGATIVE mg/dL
Leukocytes, UA: NEGATIVE
Nitrite: NEGATIVE
Protein, ur: NEGATIVE mg/dL
Specific Gravity, Urine: 1.013 (ref 1.005–1.030)
pH: 6 (ref 5.0–8.0)

## 2015-09-23 MED ORDER — IOPAMIDOL (ISOVUE-300) INJECTION 61%
INTRAVENOUS | Status: AC
  Administered 2015-09-23: 75 mL via INTRAVENOUS
  Filled 2015-09-23: qty 75

## 2015-09-23 NOTE — ED Notes (Signed)
PA at bedside

## 2015-09-23 NOTE — ED Provider Notes (Signed)
Medical screening examination/treatment/procedure(s) were conducted as a shared visit with non-physician practitioner(s) and myself.  I personally evaluated the patient during the encounter.   EKG Interpretation None      75 year old female with history of abdominal hysterectomy, hypertension, carotid artery stenosis status post stent on Plavix who presents with left-sided abdominal pain. This is been intermittent for one year. More recently has been constant and nagging. she has been seen by PCP, recommending Tylenol for pain. She has had colonoscopy in April that was unremarkable. Denies any associating nausea, vomiting, diarrhea, constipation, or urinary complaints. No sets sometimes pain seems to worsen after eating. Vital signs non-concerning on presentation. She is well-appearing. She has a soft and benign abdomen with primarily left lower quadrant and left upper quadrant tenderness to palpation. Urinalysis is clear. Basic blood work including CBC, comprehensive metabolic panel, lipase are unremarkable overall. She is stable anemia. We'll obtain CT abdomen and pelvis to rule out mass, hernia or other acute processes.   CT abdomen pelvis visualized. Negative for any mass or other acute intra-abdominal process. Unclear etiology of symptoms. At this time low suspicion for acute surgical, infectious, or other serious intra-abdominal process. She'll continue to have follow-up with her primary care provider.  Forde Dandy, MD 09/23/15 [phone]

## 2015-09-23 NOTE — Discharge Instructions (Signed)

## 2015-09-23 NOTE — ED Notes (Signed)
Patient presents stating she has been  Having right sided pain "for a long long time"  Denies painful urination last BM yesterday and normal for her

## 2015-09-23 NOTE — ED Notes (Signed)
Pt verbalized understanding of d/c instructions and follow-up care. No further questions/concerns, VSS, ambulatory w/ steady gait (refused wheelchair)

## 2015-09-23 NOTE — ED Provider Notes (Signed)
CSN: [id number]     Arrival date & time 09/22/15  2351 History   First MD Initiated Contact with Patient 09/23/15 [phone]     Chief Complaint  Patient presents with  . Abdominal Pain     (Consider location/radiation/quality/duration/timing/severity/associated sxs/prior Treatment) HPI Comments: Patient presents today with a chief complaint of abdominal pain.  Pain located left upper and left lower.  She states that she has had the pain intermittently over the past year, but has had this episode for the past week.  Pain does not radiate.  She has taken Tylenol for the pain without improvement.  She states that she has not had any prior imaging of her abdomen for this pain.  She has not seen GI, but has seen her PCP.  She states that her PCP has told her to take Tylenol for the pain, but has never told her what is causing the pain.  She denies fever, chills, urinary symptoms, nausea, vomiting, diarrhea, or constipation.  Patient is a 75 y.o. female presenting with abdominal pain. The history is provided by the patient.  Abdominal Pain   Past Medical History  Diagnosis Date  . Hypertension   . Arthritis   . Joint pain   . Allergy   . GERD (gastroesophageal reflux disease)   . Thyroid disease   . Carotid artery occlusion    Past Surgical History  Procedure Laterality Date  . Cataract extraction      bilateral  . Abdominal hysterectomy    . Carotid subclavian anastomosis  05/11/11    Right subclav. artery transected 12/17/09  . Eye surgery    . Carotid angiogram  July 04, 2012  . Arch aortogram N/A 05/04/2011    Procedure: ARCH AORTOGRAM;  Surgeon: Serafina [name], MD;  Location: [facility] CATH LAB;  Service: Cardiovascular;  Laterality: N/A;  . Carotid angiogram N/A 05/04/2011    Procedure: CAROTID ANGIOGRAM;  Surgeon: Serafina [name], MD;  Location: [facility] CATH LAB;  Service: Cardiovascular;  Laterality: N/A;  . Carotid stent insertion Right 05/11/2011    Procedure: CAROTID STENT INSERTION;   Surgeon: Serafina [name], MD;  Location: [facility] CATH LAB;  Service: Cardiovascular;  Laterality: Right;  . Arch aortogram N/A 07/04/2012    Procedure: ARCH AORTOGRAM;  Surgeon: Serafina [name], MD;  Location: [facility] CATH LAB;  Service: Cardiovascular;  Laterality: N/A;  . Carotid angiogram N/A 07/04/2012    Procedure: CAROTID ANGIOGRAM;  Surgeon: Serafina [name], MD;  Location: [facility] CATH LAB;  Service: Cardiovascular;  Laterality: N/A;   Family History  Problem Relation Age of Onset  . Heart failure Mother   . Cancer Father    Social History  Substance Use Topics  . Smoking status: Former Smoker -- 0.30 packs/day for 5 years    Types: Cigarettes    Quit date: 04/12/1980  . Smokeless tobacco: Never Used  . Alcohol Use: No   OB History    No data available     Review of Systems  Gastrointestinal: Positive for abdominal pain.  All other systems reviewed and are negative.     Allergies  Review of patient's allergies indicates no known allergies.  Home Medications   Prior to Admission medications   Medication Sig Start Date End Date Taking? Authorizing Provider  acetaminophen (TYLENOL) 325 MG tablet Take 325 mg by mouth every 6 (six) hours as needed for mild pain or headache. For pain   Yes Historical Provider, MD  aspirin 81 MG chewable tablet Chew  81 mg by mouth daily.   Yes Historical Provider, MD  calcium-vitamin D (OSCAL WITH D) 500-200 MG-UNIT per tablet Take 1 tablet by mouth daily with breakfast.   Yes Historical Provider, MD  cetirizine (ZYRTEC) 10 MG tablet Take 10 mg by mouth daily as needed for allergies. For allergies   Yes Historical Provider, MD  clopidogrel (PLAVIX) 75 MG tablet Take 75 mg by mouth daily.   Yes Historical Provider, MD  levothyroxine (SYNTHROID, LEVOTHROID) 75 MCG tablet Take 50 mcg by mouth daily before breakfast.    Yes Historical Provider, MD  lisinopril (PRINIVIL,ZESTRIL) 10 MG tablet Take 20 mg by mouth daily.    Yes Historical Provider, MD  metoprolol  succinate (TOPROL-XL) 25 MG 24 hr tablet Take 25 mg by mouth daily.    Yes Historical Provider, MD  Multiple Vitamins-Minerals (ALIVE WOMENS ENERGY) TABS Take 1 tablet by mouth daily.   Yes Historical Provider, MD  Probiotic Product (PROBIOTIC PO) Take 1 capsule by mouth daily.   Yes Historical Provider, MD  simvastatin (ZOCOR) 20 MG tablet Take 20 mg by mouth daily.   Yes Historical Provider, MD   BP 158/71 mmHg  Pulse 66  Temp(Src) 98.3 F (36.8 C) (Oral)  Resp 18  SpO2 100% Physical Exam  Constitutional: She appears well-developed and well-nourished.  HENT:  Head: Normocephalic and atraumatic.  Mouth/Throat: Oropharynx is clear and moist.  Neck: Normal range of motion. Neck supple.  Cardiovascular: Normal rate, regular rhythm and normal heart sounds.   Pulmonary/Chest: Effort normal and breath sounds normal.  Abdominal: Soft. Bowel sounds are normal. She exhibits no distension and no mass. There is tenderness in the left upper quadrant and left lower quadrant. There is no rebound and no guarding.  Musculoskeletal: Normal range of motion.  Neurological: She is alert.  Skin: Skin is warm and dry.  Psychiatric: She has a normal mood and affect.  Nursing note and vitals reviewed.   ED Course  Procedures (including critical care time) Labs Review Labs Reviewed  COMPREHENSIVE METABOLIC PANEL - Abnormal; Notable for the following:    Glucose, Bld 126 (*)    All other components within normal limits  CBC - Abnormal; Notable for the following:    RBC 3.70 (*)    Hemoglobin 11.1 (*)    HCT 35.1 (*)    All other components within normal limits  LIPASE, BLOOD  URINALYSIS, ROUTINE W REFLEX MICROSCOPIC (NOT AT [facility])    Imaging Review Ct Abdomen Pelvis W Contrast  09/23/2015  CLINICAL DATA:  Left abdominal pain EXAM: CT ABDOMEN AND PELVIS WITH CONTRAST TECHNIQUE: Multidetector CT imaging of the abdomen and pelvis was performed using the standard protocol following bolus  administration of intravenous contrast. CONTRAST:  85m ISOVUE-300 IOPAMIDOL (ISOVUE-300) INJECTION 61% COMPARISON:  CT abdomen pelvis 04/18/2008 FINDINGS: Lower chest: Mild scarring in the lung bases. No infiltrate effusion or mass. Heart size normal. Hepatobiliary: Low attenuation of liver compatible with fatty infiltration. No focal liver mass. Gallbladder and bile ducts normal. 8 x 12 mm calcification in the porta hepatis and is similar to the prior study compatible with calcified lymph node. Pancreas: Negative Spleen: Negative Adrenals/Urinary Tract: Symmetric renal excretion of contrast. No obstruction or mass or stone. Adrenal glands normal. Urinary bladder normal. Stomach/Bowel: Negative for bowel obstruction or mass. Normal appendix. Negative for diverticulitis. Small hiatal hernia. Vascular/Lymphatic: Mild atherosclerotic calcification in the aorta without aneurysm. Patent aorta and IVC. No lymphadenopathy. Reproductive: Hysterectomy changes.  No pelvic mass. Other: No free-fluid  Musculoskeletal: Mild compression fracture superior endplate of L2. This was not present in 2010 but is probably chronic. Correlate with history and pain in this area. Mild disc degeneration L5-S1. No other fracture or mass lesion. IMPRESSION: Fatty infiltration of the liver Normal appendix.  No cause for acute abdominal pain Compression fracture L2, probably chronic. Electronically Signed   By: Franchot Gallo M.D.   On: 09/23/2015 08:17   I have personally reviewed and evaluated these images and lab results as part of my medical decision-making.   EKG Interpretation None      MDM   Final diagnoses:  None   Patient presents today with a chief complaint of left sided abdominal pain.  Pain has been intermittent over the past year.  Labs unremarkable.  UA is negative.  Ab/pelvis CT scan as above is unremarkable.  CT notes compression fracture of L2, probably chronic.  Patient is not complaining of any back pain at this  time.  No recent injury.  Therefore, do not feel that this is acute.  Feel that patient is stable for discharge.  Return precautions given.     Hyman Bible, PA-C 09/23/15 [place], MD 09/23/15 [phone]

## 2015-11-04 DIAGNOSIS — E782 Mixed hyperlipidemia: Secondary | ICD-10-CM | POA: Diagnosis not present

## 2015-11-04 DIAGNOSIS — I779 Disorder of arteries and arterioles, unspecified: Secondary | ICD-10-CM | POA: Diagnosis not present

## 2015-11-04 DIAGNOSIS — I10 Essential (primary) hypertension: Secondary | ICD-10-CM | POA: Diagnosis not present

## 2015-11-04 DIAGNOSIS — I6521 Occlusion and stenosis of right carotid artery: Secondary | ICD-10-CM | POA: Diagnosis not present

## 2016-01-30 ENCOUNTER — Encounter: Payer: Self-pay | Admitting: Family

## 2016-02-03 ENCOUNTER — Ambulatory Visit (INDEPENDENT_AMBULATORY_CARE_PROVIDER_SITE_OTHER): Payer: Medicare Other | Admitting: Family

## 2016-02-03 ENCOUNTER — Encounter: Payer: Self-pay | Admitting: Family

## 2016-02-03 ENCOUNTER — Ambulatory Visit (HOSPITAL_COMMUNITY)
Admission: RE | Admit: 2016-02-03 | Discharge: 2016-02-03 | Disposition: A | Discharge status: Home / Self Care | Payer: Medicare Other | Source: Ambulatory Visit | Attending: Family | Admitting: Family

## 2016-02-03 VITALS — BP 142/66 | HR 76 | Temp 97.2°F | Resp 18 | Ht 62.0 in | Wt 141.0 lb

## 2016-02-03 DIAGNOSIS — I6523 Occlusion and stenosis of bilateral carotid arteries: Secondary | ICD-10-CM | POA: Diagnosis not present

## 2016-02-03 DIAGNOSIS — I748 Embolism and thrombosis of other arteries: Secondary | ICD-10-CM

## 2016-02-03 DIAGNOSIS — Z959 Presence of cardiac and vascular implant and graft, unspecified: Secondary | ICD-10-CM | POA: Insufficient documentation

## 2016-02-03 DIAGNOSIS — I708 Atherosclerosis of other arteries: Secondary | ICD-10-CM

## 2016-02-03 LAB — VAS US CAROTID
LCCADSYS: -136 cm/s
LCCAPDIAS: 28 cm/s
LEFT ECA DIAS: -17 cm/s
LEFT VERTEBRAL DIAS: -22 cm/s
LICADDIAS: -29 cm/s
LICAPDIAS: 20 cm/s
LICAPSYS: 122 cm/s
Left CCA dist dias: -26 cm/s
Left CCA prox sys: 171 cm/s
Left ICA dist sys: -94 cm/s
RCCAPDIAS: 28 cm/s
RIGHT CCA MID DIAS: 26 cm/s
RIGHT ECA DIAS: -9 cm/s
Right CCA prox sys: 210 cm/s
Right cca dist sys: -77 cm/s

## 2016-02-03 NOTE — Patient Instructions (Signed)
Stroke Prevention Some medical conditions and behaviors are associated with an increased chance of having a stroke. You may prevent a stroke by making healthy choices and managing medical conditions. HOW CAN I REDUCE MY RISK OF HAVING A STROKE?   Stay physically active. Get at least 30 minutes of activity on most or all days.  Do not smoke. It may also be helpful to avoid exposure to secondhand smoke.  Limit alcohol use. Moderate alcohol use is considered to be:  No more than 2 drinks per day for men.  No more than 1 drink per day for nonpregnant women.  Eat healthy foods. This involves:  Eating 5 or more servings of fruits and vegetables a day.  Making dietary changes that address high blood pressure (hypertension), high cholesterol, diabetes, or obesity.  Manage your cholesterol levels.  Making food choices that are high in fiber and low in saturated fat, trans fat, and cholesterol may control cholesterol levels.  Take any prescribed medicines to control cholesterol as directed by your health care provider.  Manage your diabetes.  Controlling your carbohydrate and sugar intake is recommended to manage diabetes.  Take any prescribed medicines to control diabetes as directed by your health care provider.  Control your hypertension.  Making food choices that are low in salt (sodium), saturated fat, trans fat, and cholesterol is recommended to manage hypertension.  Ask your health care provider if you need treatment to lower your blood pressure. Take any prescribed medicines to control hypertension as directed by your health care provider.  If you are 18-39 years of age, have your blood pressure checked every 3-5 years. If you are 40 years of age or older, have your blood pressure checked every year.  Maintain a healthy weight.  Reducing calorie intake and making food choices that are low in sodium, saturated fat, trans fat, and cholesterol are recommended to manage  weight.  Stop drug abuse.  Avoid taking birth control pills.  Talk to your health care provider about the risks of taking birth control pills if you are over 35 years old, smoke, get migraines, or have ever had a blood clot.  Get evaluated for sleep disorders (sleep apnea).  Talk to your health care provider about getting a sleep evaluation if you snore a lot or have excessive sleepiness.  Take medicines only as directed by your health care provider.  For some people, aspirin or blood thinners (anticoagulants) are helpful in reducing the risk of forming abnormal blood clots that can lead to stroke. If you have the irregular heart rhythm of atrial fibrillation, you should be on a blood thinner unless there is a good reason you cannot take them.  Understand all your medicine instructions.  Make sure that other conditions (such as anemia or atherosclerosis) are addressed. SEEK IMMEDIATE MEDICAL CARE IF:   You have sudden weakness or numbness of the face, arm, or leg, especially on one side of the body.  Your face or eyelid droops to one side.  You have sudden confusion.  You have trouble speaking (aphasia) or understanding.  You have sudden trouble seeing in one or both eyes.  You have sudden trouble walking.  You have dizziness.  You have a loss of balance or coordination.  You have a sudden, severe headache with no known cause.  You have new chest pain or an irregular heartbeat. Any of these symptoms may represent a serious problem that is an emergency. Do not wait to see if the symptoms will   go away. Get medical help at once. Call your local emergency services (911 in [place] Do not drive yourself to the hospital.   This information is not intended to replace advice given to you by your health care provider. Make sure you discuss any questions you have with your health care provider.   Document Released: 05/06/2004 Document Revised: 04/19/2014 Document Reviewed:  09/29/2012 Elsevier Interactive Patient Education [employer].

## 2016-02-03 NOTE — Progress Notes (Signed)
Chief Complaint: Follow up Extracranial Carotid Artery Stenosis   History of Present Illness  [name] is a 75 y.o. female patient of Dr. 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 She has no arm claudication symptoms since this procedure. 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 Trula Slade 05/11/2011.  She returns today for follow up.  The patient denies any history of TIA or stroke symptoms. Specifically the patient denies a history of amaurosis fugax or monocular blindness, unilateral facial drooping, hemiplegia, or receptive or expressive aphasia.   She walks 2+ miles daily, she denies claudication symptoms with walking, denies non healing wounds.   Pt denies New Medical or Surgical History.   Pt Diabetic: No  Pt smoker: former smoker, quit about 1982  Pt meds include:  Statin :yes  Betablocker: Yes  ASA: Yes  Other anticoagulants/antiplatelets: Plavix   Past Medical History:  Diagnosis Date  . Allergy   . Arthritis   . Carotid artery occlusion   . GERD (gastroesophageal reflux disease)   . Hypertension   . Joint pain   . Thyroid disease     Social History Social History  Substance Use Topics  . Smoking status: Former Smoker    Packs/day: 0.30    Years: 5.00    Types: Cigarettes    Quit date: 04/12/1980  . Smokeless tobacco: Never Used  . Alcohol use No    Family History Family History  Problem Relation Age of Onset  . Heart failure Mother   . Cancer Father     Surgical History Past Surgical History:  Procedure Laterality Date  . ABDOMINAL HYSTERECTOMY    . ARCH AORTOGRAM N/A 05/04/2011   Procedure: ARCH AORTOGRAM;  Surgeon: Serafina [name], MD;  Location: [facility] CATH LAB;  Service: Cardiovascular;  Laterality: N/A;  . ARCH AORTOGRAM N/A 07/04/2012   Procedure:  ARCH AORTOGRAM;  Surgeon: Serafina [name], MD;  Location: [facility] CATH LAB;  Service: Cardiovascular;  Laterality: N/A;  . CAROTID ANGIOGRAM  July 04, 2012  . CAROTID ANGIOGRAM N/A 05/04/2011   Procedure: CAROTID ANGIOGRAM;  Surgeon: Serafina [name], MD;  Location: [facility] CATH LAB;  Service: Cardiovascular;  Laterality: N/A;  . CAROTID ANGIOGRAM N/A 07/04/2012   Procedure: CAROTID ANGIOGRAM;  Surgeon: Serafina [name], MD;  Location: [facility] CATH LAB;  Service: Cardiovascular;  Laterality: N/A;  . CAROTID STENT INSERTION Right 05/11/2011   Procedure: CAROTID STENT INSERTION;  Surgeon: Serafina [name], MD;  Location: [facility] CATH LAB;  Service: Cardiovascular;  Laterality: Right;  . carotid subclavian anastomosis  05/11/11   Right subclav. artery transected 12/17/09  . CATARACT EXTRACTION     bilateral  . EYE SURGERY      Allergies  Allergen Reactions  . Meclizine Nausea And Vomiting    Current Outpatient Prescriptions  Medication Sig Dispense Refill  . acetaminophen (TYLENOL) 325 MG tablet Take 325 mg by mouth every 6 (six) hours as needed for mild pain or headache. For pain    . aspirin 81 MG chewable tablet Chew 81 mg by mouth daily.    . calcium-vitamin D (OSCAL WITH D) 500-200 MG-UNIT per tablet Take 1 tablet by mouth daily with breakfast.    . cetirizine (ZYRTEC) 10 MG tablet Take 10 mg by mouth daily as needed for allergies. For allergies    . clopidogrel (PLAVIX)  75 MG tablet Take 75 mg by mouth daily.    Marland Kitchen levothyroxine (SYNTHROID, LEVOTHROID) 75 MCG tablet Take 50 mcg by mouth daily before breakfast.     . lisinopril (PRINIVIL,ZESTRIL) 20 MG tablet Take by mouth.    . metoprolol succinate (TOPROL-XL) 25 MG 24 hr tablet Take 25 mg by mouth daily.     . Multiple Vitamins-Minerals (ALIVE WOMENS ENERGY) TABS Take 1 tablet by mouth daily.    . Probiotic Product (PROBIOTIC PO) Take 1 capsule by mouth daily.    . simvastatin (ZOCOR) 20 MG tablet Take 20 mg by mouth daily.     No current  facility-administered medications for this visit.     Review of Systems : See HPI for pertinent positives and negatives.  Physical Examination  Vitals:   02/03/16 1525 02/03/16 1528  BP: 140/68 (!) 142/66  Pulse: 74 76  Resp: 18   Temp: 97.2 F (36.2 C)   TempSrc: Oral   SpO2: 94%   Weight: 141 lb (64 kg)   Height: _0  (1.575 m)    Body mass index is 25.79 kg/m.  General: WDWN female in NAD  GAIT: normal  Eyes: PERRLA  Pulmonary: Respirations are non labored, CTAB, good air movement in all fields.  Cardiac: regular rhythm, + murmur.   VASCULAR EXAM  Carotid Bruits  Left  Right    Positive, soft  Positive, harsh    Radial pulses: right is weakly palpable, left is 2+ palpable.  Brachial pulses: 2+ palpable bilaterally.   LE Pulses  LEFT  RIGHT   POPLITEAL  not palpable  not palpable   POSTERIOR TIBIAL  not palpable  palpable   DORSALIS PEDIS  ANTERIOR TIBIAL  palpable  palpable    Gastrointestinal: soft, nontender, BS WNL, no r/g, no masses palpated.  Musculoskeletal: No muscle atrophy/wasting. M/S 5/5 throughout, Extremities without ischemic changes.  Neurologic: A&O X 3; Appropriate Affect ; SENSATION ;normal;  Speech is normalCN 2-12 intact except slightly hard of hearing, Pain and light touch intact in extremities, Motor exam as listed above.     Assessment: [name] is a 75 y.o. 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 She has no arm steal symptoms since this procedure. 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 Trula Slade 05/11/2011.  No significant difference in bilateral brachial pressures. She has no history of stroke or TIA. She remains very active.  I discussed the carotid duplex results with Dr. Donnetta Hutching, see Plan.   DATA Today's  carotid duplex suggests less than 40% bilateral internal carotid artery stenosis. Patent stent of the proximal CCA. Right subclavian artery velocity is 258 cm/s; left subclavian artery velocity is 270 cm/s. Velocity of 477/41 cm/s at what appears to be the anastomosis os the CCA and subclavian artery.  No significant change compared to the last exam on 01/28/15.     Plan: Follow-up in 6 months with Carotid Duplex scan and see Dr. Trula Slade.   I discussed in depth with the patient the nature of atherosclerosis, and emphasized the importance of maximal medical management including strict control of blood pressure, blood glucose, and lipid levels, obtaining regular exercise, and continued cessation of smoking.  The patient is aware that without maximal medical management the underlying atherosclerotic disease process will progress, limiting the benefit of any interventions. The patient was  given information about stroke prevention and what symptoms should prompt the patient to seek immediate medical care. Thank you for allowing [place] to participate in this patient's care.  Clemon Chambers, RN, MSN, FNP-C [facility] Office: [phone]  Clinic Physician: Early  02/03/16 3:49 PM

## 2016-04-13 DIAGNOSIS — M8580 Other specified disorders of bone density and structure, unspecified site: Secondary | ICD-10-CM | POA: Diagnosis not present

## 2016-04-13 DIAGNOSIS — I779 Disorder of arteries and arterioles, unspecified: Secondary | ICD-10-CM | POA: Diagnosis not present

## 2016-04-13 DIAGNOSIS — E039 Hypothyroidism, unspecified: Secondary | ICD-10-CM | POA: Diagnosis not present

## 2016-04-13 DIAGNOSIS — E782 Mixed hyperlipidemia: Secondary | ICD-10-CM | POA: Diagnosis not present

## 2016-04-13 DIAGNOSIS — I10 Essential (primary) hypertension: Secondary | ICD-10-CM | POA: Diagnosis not present

## 2016-04-25 DIAGNOSIS — J069 Acute upper respiratory infection, unspecified: Secondary | ICD-10-CM | POA: Diagnosis not present

## 2016-04-29 ENCOUNTER — Encounter (HOSPITAL_COMMUNITY): Payer: Self-pay | Admitting: Emergency Medicine

## 2016-04-29 ENCOUNTER — Ambulatory Visit (HOSPITAL_COMMUNITY)
Admission: EM | Admit: 2016-04-29 | Discharge: 2016-04-29 | Disposition: A | Discharge status: Home / Self Care | Payer: Medicare Other | Attending: Emergency Medicine | Admitting: Emergency Medicine

## 2016-04-29 DIAGNOSIS — J40 Bronchitis, not specified as acute or chronic: Secondary | ICD-10-CM | POA: Diagnosis not present

## 2016-04-29 MED ORDER — BENZONATATE 100 MG PO CAPS: 100.0000 mg | ORAL_CAPSULE | Freq: Three times a day (TID) | ORAL | 0 refills | Status: DC

## 2016-04-29 MED ORDER — PREDNISONE 10 MG (21) PO TBPK: 20.0000 mg | ORAL_TABLET | Freq: Two times a day (BID) | ORAL | 0 refills | Status: DC

## 2016-04-29 MED ORDER — AZITHROMYCIN 250 MG PO TABS: 250.0000 mg | ORAL_TABLET | Freq: Every day | ORAL | 0 refills | Status: DC

## 2016-04-29 NOTE — ED Provider Notes (Signed)
CSN: [id number]     Arrival date & time 04/29/16  1729 History   First MD Initiated Contact with Patient 04/29/16 1822     Chief Complaint  Patient presents with  . URI   (Consider location/radiation/quality/duration/timing/severity/associated sxs/prior Treatment) 76 year old female presents to clinic with 1 week history of cough, dry and hacking, worse at night, along with congestion, fever, and body aches. She has been taking tylenol and robitussin over the counter with some relief. She reports her appetite has been good and has been drinking plenty of water. Denies weakness, dizziness, or other symptoms.   The history is provided by the patient.  URI    Past Medical History:  Diagnosis Date  . Allergy   . Arthritis   . Carotid artery occlusion   . GERD (gastroesophageal reflux disease)   . Hypertension   . Joint pain   . Thyroid disease    Past Surgical History:  Procedure Laterality Date  . ABDOMINAL HYSTERECTOMY    . ARCH AORTOGRAM N/A 05/04/2011   Procedure: ARCH AORTOGRAM;  Surgeon: Serafina [name], MD;  Location: [facility] CATH LAB;  Service: Cardiovascular;  Laterality: N/A;  . ARCH AORTOGRAM N/A 07/04/2012   Procedure: ARCH AORTOGRAM;  Surgeon: Serafina [name], MD;  Location: [facility] CATH LAB;  Service: Cardiovascular;  Laterality: N/A;  . CAROTID ANGIOGRAM  July 04, 2012  . CAROTID ANGIOGRAM N/A 05/04/2011   Procedure: CAROTID ANGIOGRAM;  Surgeon: Serafina [name], MD;  Location: [facility] CATH LAB;  Service: Cardiovascular;  Laterality: N/A;  . CAROTID ANGIOGRAM N/A 07/04/2012   Procedure: CAROTID ANGIOGRAM;  Surgeon: Serafina [name], MD;  Location: [facility] CATH LAB;  Service: Cardiovascular;  Laterality: N/A;  . CAROTID STENT INSERTION Right 05/11/2011   Procedure: CAROTID STENT INSERTION;  Surgeon: Serafina [name], MD;  Location: [facility] CATH LAB;  Service: Cardiovascular;  Laterality: Right;  . carotid subclavian anastomosis  05/11/11   Right subclav. artery transected 12/17/09  . CATARACT  EXTRACTION     bilateral  . EYE SURGERY     Family History  Problem Relation Age of Onset  . Heart failure Mother   . Cancer Father    Social History  Substance Use Topics  . Smoking status: Former Smoker    Packs/day: 0.30    Years: 5.00    Types: Cigarettes    Quit date: 04/12/1980  . Smokeless tobacco: Never Used  . Alcohol use No   OB History    No data available     Review of Systems  Reason unable to perform ROS: as covered in HPI.    Allergies  Meclizine  Home Medications   Prior to Admission medications   Medication Sig Start Date End Date Taking? Authorizing Provider  aspirin 81 MG chewable tablet Chew 81 mg by mouth daily.   Yes Historical Provider, MD  calcium-vitamin D (OSCAL WITH D) 500-200 MG-UNIT per tablet Take 1 tablet by mouth daily with breakfast.   Yes Historical Provider, MD  cetirizine (ZYRTEC) 10 MG tablet Take 10 mg by mouth daily as needed for allergies. For allergies   Yes Historical Provider, MD  clopidogrel (PLAVIX) 75 MG tablet Take 75 mg by mouth daily.   Yes Historical Provider, MD  levothyroxine (SYNTHROID, LEVOTHROID) 75 MCG tablet Take 50 mcg by mouth daily before breakfast.    Yes Historical Provider, MD  lisinopril (PRINIVIL,ZESTRIL) 20 MG tablet Take by mouth. 07/08/15  Yes Historical Provider, MD  metoprolol succinate (TOPROL-XL) 25 MG 24  hr tablet Take 25 mg by mouth daily.    Yes Historical Provider, MD  Multiple Vitamins-Minerals (ALIVE WOMENS ENERGY) TABS Take 1 tablet by mouth daily.   Yes Historical Provider, MD  Probiotic Product (PROBIOTIC PO) Take 1 capsule by mouth daily.   Yes Historical Provider, MD  simvastatin (ZOCOR) 20 MG tablet Take 20 mg by mouth daily.   Yes Historical Provider, MD  acetaminophen (TYLENOL) 325 MG tablet Take 325 mg by mouth every 6 (six) hours as needed for mild pain or headache. For pain    Historical Provider, MD  azithromycin (ZITHROMAX) 250 MG tablet Take 1 tablet (250 mg total) by mouth daily.  Take first 2 tablets together, then 1 every day until finished. 04/29/16   Barnet Glasgow, NP  benzonatate (TESSALON) 100 MG capsule Take 1 capsule (100 mg total) by mouth every 8 (eight) hours. 04/29/16   Barnet Glasgow, NP  predniSONE (STERAPRED UNI-PAK 21 TAB) 10 MG (21) TBPK tablet Take 2 tablets (20 mg total) by mouth 2 (two) times daily. Take two tablets, twice a day, for 6 days. 04/29/16 05/05/16  Barnet Glasgow, NP   Meds Ordered and Administered this Visit  Medications - No data to display  BP (!) 122/53 (BP Location: Left Arm)   Pulse 78   Temp 98 F (36.7 C) (Oral)   Resp 16   SpO2 99%  No data found.   Physical Exam  Constitutional: She is oriented to person, place, and time. She appears well-developed and well-nourished. She does not have a sickly appearance. She does not appear ill. No distress.  HENT:  Right Ear: Tympanic membrane and external ear normal.  Left Ear: Tympanic membrane and external ear normal.  Nose: Nose normal. Right sinus exhibits no maxillary sinus tenderness and no frontal sinus tenderness. Left sinus exhibits no maxillary sinus tenderness and no frontal sinus tenderness.  Mouth/Throat: Uvula is midline and oropharynx is clear and moist. No oropharyngeal exudate.  Eyes: Pupils are equal, round, and reactive to light.  Neck: Normal range of motion. Neck supple. No JVD present.  Cardiovascular: Normal rate and regular rhythm.   Pulmonary/Chest: Effort normal. No tachypnea and no bradypnea. No respiratory distress. She has no wheezes. She has rhonchi in the right lower field and the left lower field.  Abdominal: Soft. Bowel sounds are normal. She exhibits no distension. There is no tenderness. There is no guarding.  Lymphadenopathy:    She has no cervical adenopathy.  Neurological: She is alert and oriented to person, place, and time.  Skin: Skin is warm and dry. Capillary refill takes less than 2 seconds. She is not diaphoretic.  Psychiatric: She has  a normal mood and affect.  Nursing note and vitals reviewed.   Urgent Care Course     Procedures (including critical care time)  Labs Review Labs Reviewed - No data to display  Imaging Review No results found.   Visual Acuity Review  Right Eye Distance:   Left Eye Distance:   Bilateral Distance:    Right Eye Near:   Left Eye Near:    Bilateral Near:         MDM   1. Bronchitis   You most likely have bronchitis. I have sent a prescription to your pharmacy for azithromycin, take 2 tablets today, then 1 daily till finished. I have also prescribed a steroid, prednisone. Take two tablets twice a day for 6 days, and Tessalon for cough, take 1 tablet every 8 hours as needed  for cough. For body aches do tylenol ever 4 hours, not to exceed 4000 mg in a day, and for congestion do Zrytec, 1 tablet every day. Should your symptoms fail to resolve or worsen follow up with your primary care provider or return to clinic.     Barnet Glasgow, NP 04/29/16 1836

## 2016-04-29 NOTE — Discharge Instructions (Signed)
You most likely have bronchitis. I have sent a prescription to your pharmacy for azithromycin, take 2 tablets today, then 1 daily till finished. I have also prescribed a steroid, prednisone. Take two tablets twice a day for 6 days, and Tessalon for cough, take 1 tablet every 8 hours as needed for cough. For body aches do tylenol ever 4 hours, not to exceed 4000 mg in a day, and for congestion do Zrytec, 1 tablet every day. Should your symptoms fail to resolve or worsen follow up with your primary care provider or return to clinic.

## 2016-04-29 NOTE — ED Triage Notes (Signed)
Pt c/o cold sx onset: 1 week   Sx include: BAs, fevers, prod cough  Taking tyle w/temp relief.   A&O x4... NAD

## 2016-05-05 ENCOUNTER — Ambulatory Visit (HOSPITAL_COMMUNITY)
Admission: EM | Admit: 2016-05-05 | Discharge: 2016-05-05 | Disposition: A | Discharge status: Home / Self Care | Payer: Medicare Other | Attending: Emergency Medicine | Admitting: Emergency Medicine

## 2016-05-05 ENCOUNTER — Encounter (HOSPITAL_COMMUNITY): Payer: Self-pay | Admitting: Emergency Medicine

## 2016-05-05 DIAGNOSIS — R42 Dizziness and giddiness: Secondary | ICD-10-CM

## 2016-05-05 DIAGNOSIS — H6591 Unspecified nonsuppurative otitis media, right ear: Secondary | ICD-10-CM | POA: Diagnosis not present

## 2016-05-05 MED ORDER — DIAZEPAM 2 MG PO TABS: 2.0000 mg | ORAL_TABLET | Freq: Three times a day (TID) | ORAL | 0 refills | Status: DC | PRN

## 2016-05-05 NOTE — Discharge Instructions (Signed)
You are having some vertigo. This is coming from inflammation of your inner ear. Take Valium every 8 hours as needed for dizziness. This medicine will make you sleepy. If you are still having the vertigo in 1 week, follow-up with your primary care doctor for additional evaluation and possible referral.

## 2016-05-05 NOTE — ED Triage Notes (Signed)
The patient presented to the [facility] with a complaint of general body aches and right ear pain that is also effecting her gait. She reported the symptoms x 1 week.

## 2016-05-05 NOTE — ED Provider Notes (Signed)
[place]    CSN: [id number] Arrival date & time: 05/05/16  1030     History   Chief Complaint Chief Complaint  Patient presents with  . Generalized Body Aches  . Otalgia    HPI [name] is a 76 y.o. female.   HPI  She is a 76 year old woman here with her grandchildren for evaluation of dizziness and right ear discomfort. For the last week, she reports feeling dizzy and off-balance. This primarily occurs with walking. The dizziness is described as a spinning sensation. She states it is always in the same direction. This is associated with some right ear discomfort, but she denies frank pain. She is also had some headache and upper body aches. She was seen here last week and treated for bronchitis with azithromycin and a prednisone taper. She states those symptoms have resolved.  Past Medical History:  Diagnosis Date  . Allergy   . Arthritis   . Carotid artery occlusion   . GERD (gastroesophageal reflux disease)   . Hypertension   . Joint pain   . Thyroid disease     Patient Active Problem List   Diagnosis Date Noted  . Carotid stenosis 01/22/2014  . Aftercare following surgery of the circulatory system, [place] 01/02/2013  . Occlusion and stenosis of carotid artery without mention of cerebral infarction 04/27/2011    Past Surgical History:  Procedure Laterality Date  . ABDOMINAL HYSTERECTOMY    . ARCH AORTOGRAM N/A 05/04/2011   Procedure: ARCH AORTOGRAM;  Surgeon: Serafina [name], MD;  Location: [facility] CATH LAB;  Service: Cardiovascular;  Laterality: N/A;  . ARCH AORTOGRAM N/A 07/04/2012   Procedure: ARCH AORTOGRAM;  Surgeon: Serafina [name], MD;  Location: [facility] CATH LAB;  Service: Cardiovascular;  Laterality: N/A;  . CAROTID ANGIOGRAM  July 04, 2012  . CAROTID ANGIOGRAM N/A 05/04/2011   Procedure: CAROTID ANGIOGRAM;  Surgeon: Serafina [name], MD;  Location: [facility] CATH LAB;  Service: Cardiovascular;  Laterality: N/A;  . CAROTID ANGIOGRAM N/A 07/04/2012   Procedure: CAROTID ANGIOGRAM;  Surgeon: Serafina [name], MD;  Location: [facility] CATH LAB;  Service: Cardiovascular;  Laterality: N/A;  . CAROTID STENT INSERTION Right 05/11/2011   Procedure: CAROTID STENT INSERTION;  Surgeon: Serafina [name], MD;  Location: [facility] CATH LAB;  Service: Cardiovascular;  Laterality: Right;  . carotid subclavian anastomosis  05/11/11   Right subclav. artery transected 12/17/09  . CATARACT EXTRACTION     bilateral  . EYE SURGERY      OB History    No data available       Home Medications    Prior to Admission medications   Medication Sig Start Date End Date Taking? Authorizing Provider  acetaminophen (TYLENOL) 325 MG tablet Take 325 mg by mouth every 6 (six) hours as needed for mild pain or headache. For pain    Historical Provider, MD  aspirin 81 MG chewable tablet Chew 81 mg by mouth daily.    Historical Provider, MD  benzonatate (TESSALON) 100 MG capsule Take 1 capsule (100 mg total) by mouth every 8 (eight) hours. 04/29/16   Barnet Glasgow, NP  calcium-vitamin D (OSCAL WITH D) 500-200 MG-UNIT per tablet Take 1 tablet by mouth daily with breakfast.    Historical Provider, MD  cetirizine (ZYRTEC) 10 MG tablet Take 10 mg by mouth daily as needed for allergies. For allergies    Historical Provider, MD  clopidogrel (PLAVIX) 75 MG tablet Take 75 mg by mouth daily.    Historical  Provider, MD  diazepam (VALIUM) 2 MG tablet Take 1 tablet (2 mg total) by mouth every 8 (eight) hours as needed (vertigo). 05/05/16   Melony Overly, MD  levothyroxine (SYNTHROID, LEVOTHROID) 75 MCG tablet Take 50 mcg by mouth daily before breakfast.     Historical Provider, MD  lisinopril (PRINIVIL,ZESTRIL) 20 MG tablet Take by mouth. 07/08/15   Historical Provider, MD  metoprolol succinate (TOPROL-XL) 25 MG 24 hr tablet Take 25 mg by mouth daily.     Historical Provider, MD  Multiple Vitamins-Minerals (ALIVE WOMENS ENERGY) TABS Take 1 tablet by mouth daily.    Historical Provider, MD  Probiotic  Product (PROBIOTIC PO) Take 1 capsule by mouth daily.    Historical Provider, MD  simvastatin (ZOCOR) 20 MG tablet Take 20 mg by mouth daily.    Historical Provider, MD    Family History Family History  Problem Relation Age of Onset  . Heart failure Mother   . Cancer Father     Social History Social History  Substance Use Topics  . Smoking status: Former Smoker    Packs/day: 0.30    Years: 5.00    Types: Cigarettes    Quit date: 04/12/1980  . Smokeless tobacco: Never Used  . Alcohol use No     Allergies   Meclizine   Review of Systems Review of Systems As in history of present illness  Physical Exam Triage Vital Signs ED Triage Vitals  Enc Vitals Group     BP 05/05/16 1056 157/65     Pulse Rate 05/05/16 1056 78     Resp --      Temp 05/05/16 1056 98.3 F (36.8 C)     Temp Source 05/05/16 1056 Oral     SpO2 05/05/16 1056 98 %     Weight --      Height --      Head Circumference --      Peak Flow --      Pain Score 05/05/16 1055 0     Pain Loc --      Pain Edu? --      Excl. in [place]? --    No data found.   Updated Vital Signs BP 157/65 (BP Location: Right Arm)   Pulse 78 Comment: Irregular  Temp 98.3 F (36.8 C) (Oral)   SpO2 98%   Visual Acuity Right Eye Distance:   Left Eye Distance:   Bilateral Distance:    Right Eye Near:   Left Eye Near:    Bilateral Near:     Physical Exam  Constitutional: She is oriented to person, place, and time. She appears well-developed and well-nourished. No distress.  HENT:  Right ear with clear effusion. Left TM normal.  Eyes: EOM are normal. Pupils are equal, round, and reactive to light.  Cardiovascular: Normal rate.   Pulmonary/Chest: Effort normal.  Neurological: She is alert and oriented to person, place, and time.  Positive Dix-Hallpike, more so on the right. No nystagmus, but reproduction of spinning sensation.     UC Treatments / Results  Labs (all labs ordered are listed, but only abnormal results  are displayed) Labs Reviewed - No data to display  EKG  EKG Interpretation None       Radiology No results found.  Procedures Procedures (including critical care time)  Medications Ordered in UC Medications - No data to display   Initial Impression / Assessment and Plan / UC Course  I have reviewed the triage vital signs and  the nursing notes.  Pertinent labs & imaging results that were available during my care of the patient were reviewed by me and considered in my medical decision making (see chart for details).     We'll hold off on additional steroids that she just completed a steroid taper. With allergy to meclizine, will treat with low-dose diazepam symptomatically. If symptoms have not resolved in 1 week, follow-up with PCP for additional evaluation and possible referral for vestibular rehabilitation.  Final Clinical Impressions(s) / UC Diagnoses   Final diagnoses:  Vertigo  Middle ear effusion, right    New Prescriptions New Prescriptions   DIAZEPAM (VALIUM) 2 MG TABLET    Take 1 tablet (2 mg total) by mouth every 8 (eight) hours as needed (vertigo).     Melony Overly, MD 05/05/16 1137

## 2016-05-25 DIAGNOSIS — R350 Frequency of micturition: Secondary | ICD-10-CM | POA: Diagnosis not present

## 2016-05-25 DIAGNOSIS — H8113 Benign paroxysmal vertigo, bilateral: Secondary | ICD-10-CM | POA: Diagnosis not present

## 2016-05-25 DIAGNOSIS — G3184 Mild cognitive impairment, so stated: Secondary | ICD-10-CM | POA: Diagnosis not present

## 2016-05-25 DIAGNOSIS — I10 Essential (primary) hypertension: Secondary | ICD-10-CM | POA: Diagnosis not present

## 2016-05-25 DIAGNOSIS — N393 Stress incontinence (female) (male): Secondary | ICD-10-CM | POA: Diagnosis not present

## 2016-05-25 DIAGNOSIS — H6523 Chronic serous otitis media, bilateral: Secondary | ICD-10-CM | POA: Diagnosis not present

## 2016-05-25 DIAGNOSIS — E039 Hypothyroidism, unspecified: Secondary | ICD-10-CM | POA: Diagnosis not present

## 2016-06-25 DIAGNOSIS — R42 Dizziness and giddiness: Secondary | ICD-10-CM | POA: Diagnosis not present

## 2016-06-25 DIAGNOSIS — H9113 Presbycusis, bilateral: Secondary | ICD-10-CM | POA: Diagnosis not present

## 2016-06-25 DIAGNOSIS — Z87891 Personal history of nicotine dependence: Secondary | ICD-10-CM | POA: Diagnosis not present

## 2016-06-25 DIAGNOSIS — Z95828 Presence of other vascular implants and grafts: Secondary | ICD-10-CM | POA: Diagnosis not present

## 2016-07-07 DIAGNOSIS — Z8781 Personal history of (healed) traumatic fracture: Secondary | ICD-10-CM | POA: Diagnosis not present

## 2016-07-07 DIAGNOSIS — M8580 Other specified disorders of bone density and structure, unspecified site: Secondary | ICD-10-CM | POA: Diagnosis not present

## 2016-07-07 DIAGNOSIS — E039 Hypothyroidism, unspecified: Secondary | ICD-10-CM | POA: Diagnosis not present

## 2016-07-07 DIAGNOSIS — M899 Disorder of bone, unspecified: Secondary | ICD-10-CM | POA: Diagnosis not present

## 2016-07-07 DIAGNOSIS — Z1231 Encounter for screening mammogram for malignant neoplasm of breast: Secondary | ICD-10-CM | POA: Diagnosis not present

## 2016-07-07 DIAGNOSIS — I10 Essential (primary) hypertension: Secondary | ICD-10-CM | POA: Diagnosis not present

## 2016-07-07 DIAGNOSIS — E559 Vitamin D deficiency, unspecified: Secondary | ICD-10-CM | POA: Diagnosis not present

## 2016-07-08 DIAGNOSIS — H05243 Constant exophthalmos, bilateral: Secondary | ICD-10-CM | POA: Diagnosis not present

## 2016-07-08 DIAGNOSIS — H401112 Primary open-angle glaucoma, right eye, moderate stage: Secondary | ICD-10-CM | POA: Diagnosis not present

## 2016-07-08 DIAGNOSIS — H04123 Dry eye syndrome of bilateral lacrimal glands: Secondary | ICD-10-CM | POA: Diagnosis not present

## 2016-07-08 DIAGNOSIS — H401121 Primary open-angle glaucoma, left eye, mild stage: Secondary | ICD-10-CM | POA: Diagnosis not present

## 2016-07-08 DIAGNOSIS — Z961 Presence of intraocular lens: Secondary | ICD-10-CM | POA: Diagnosis not present

## 2016-07-08 DIAGNOSIS — E0500 Thyrotoxicosis with diffuse goiter without thyrotoxic crisis or storm: Secondary | ICD-10-CM | POA: Diagnosis not present

## 2016-07-30 ENCOUNTER — Encounter: Payer: Self-pay | Admitting: Family

## 2016-07-30 ENCOUNTER — Other Ambulatory Visit: Payer: Self-pay | Admitting: Family Medicine

## 2016-07-30 DIAGNOSIS — Z1231 Encounter for screening mammogram for malignant neoplasm of breast: Secondary | ICD-10-CM

## 2016-07-30 DIAGNOSIS — M8580 Other specified disorders of bone density and structure, unspecified site: Secondary | ICD-10-CM

## 2016-08-06 ENCOUNTER — Other Ambulatory Visit: Payer: Self-pay | Admitting: *Deleted

## 2016-08-06 DIAGNOSIS — I708 Atherosclerosis of other arteries: Secondary | ICD-10-CM

## 2016-08-06 DIAGNOSIS — I6523 Occlusion and stenosis of bilateral carotid arteries: Secondary | ICD-10-CM

## 2016-08-09 ENCOUNTER — Encounter: Payer: Self-pay | Admitting: Surgery

## 2016-08-09 ENCOUNTER — Ambulatory Visit (INDEPENDENT_AMBULATORY_CARE_PROVIDER_SITE_OTHER): Payer: Medicare Other | Admitting: Surgery

## 2016-08-09 ENCOUNTER — Ambulatory Visit (HOSPITAL_COMMUNITY)
Admission: RE | Admit: 2016-08-09 | Discharge: 2016-08-09 | Disposition: A | Discharge status: Home / Self Care | Payer: Medicare Other | Source: Ambulatory Visit | Attending: Surgery | Admitting: Surgery

## 2016-08-09 VITALS — BP 157/78 | HR 86 | Temp 97.6°F | Resp 20 | Ht 62.0 in | Wt 147.0 lb

## 2016-08-09 DIAGNOSIS — I708 Atherosclerosis of other arteries: Secondary | ICD-10-CM | POA: Diagnosis not present

## 2016-08-09 DIAGNOSIS — I6523 Occlusion and stenosis of bilateral carotid arteries: Secondary | ICD-10-CM | POA: Diagnosis not present

## 2016-08-09 DIAGNOSIS — I6529 Occlusion and stenosis of unspecified carotid artery: Secondary | ICD-10-CM | POA: Diagnosis not present

## 2016-08-09 NOTE — Progress Notes (Signed)
HISTORY AND PHYSICAL     CC:  Follow up ultrasound Referring Provider:  Leamon Arnt, MD  HPI: This is a 76 y.o. 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  She states that she is doing well.   She was seen in January for vertigo and a right middle ear effusion.  She did complete a steroid taper.  She has an allergy to Meclizine.  Those symptoms have resolved.  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   She denies and speech difficulty or hemiparesis.    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 Trula Slade on 05/10/16.  She continues to take her Plavix and aspirin.  She takes a statin for cholesterol management.  She is on a beta blocker and ACEI for blood pressure management.  She is on levothyroxine for hypothyroidism.    She continues to walk ~ 2 miles per day.  She has a remote tobacco hx as she quit in 1982.      Past Medical History:  Diagnosis Date  . Allergy   . Arthritis   . Carotid artery occlusion   . GERD (gastroesophageal reflux disease)   . Hypertension   . Joint pain   . Thyroid disease     Past Surgical History:  Procedure Laterality Date  . ABDOMINAL HYSTERECTOMY    . ARCH AORTOGRAM N/A 05/04/2011   Procedure: ARCH AORTOGRAM;  Surgeon: Serafina [name], MD;  Location: [facility] CATH LAB;  Service: Cardiovascular;  Laterality: N/A;  . ARCH AORTOGRAM N/A 07/04/2012   Procedure: ARCH AORTOGRAM;  Surgeon: Serafina [name], MD;  Location: [facility] CATH LAB;  Service: Cardiovascular;  Laterality: N/A;  . CAROTID ANGIOGRAM  July 04, 2012  . CAROTID ANGIOGRAM N/A 05/04/2011   Procedure: CAROTID ANGIOGRAM;  Surgeon: Serafina [name], MD;  Location: [facility] CATH LAB;  Service: Cardiovascular;  Laterality: N/A;    . CAROTID ANGIOGRAM N/A 07/04/2012   Procedure: CAROTID ANGIOGRAM;  Surgeon: Serafina [name], MD;  Location: [facility] CATH LAB;  Service: Cardiovascular;  Laterality: N/A;  . CAROTID STENT INSERTION Right 05/11/2011   Procedure: CAROTID STENT INSERTION;  Surgeon: Serafina [name], MD;  Location: [facility] CATH LAB;  Service: Cardiovascular;  Laterality: Right;  . carotid subclavian anastomosis  05/11/11   Right subclav. artery transected 12/17/09  . CATARACT EXTRACTION     bilateral  . EYE SURGERY      Allergies  Allergen Reactions  . Meclizine Nausea And Vomiting    Current Outpatient Prescriptions  Medication Sig Dispense Refill  . acetaminophen (TYLENOL) 325 MG tablet Take 325 mg by mouth every 6 (six) hours as needed for mild pain or headache. For pain    . aspirin 81 MG chewable tablet Chew 81 mg by mouth daily.    . benzonatate (TESSALON) 100 MG capsule Take 1 capsule (100 mg total) by mouth every 8 (eight) hours. 21 capsule 0  . calcium-vitamin D (OSCAL WITH D) 500-200 MG-UNIT per tablet Take 1 tablet by mouth daily with breakfast.    . cetirizine (ZYRTEC) 10 MG tablet Take 10 mg by mouth daily as needed for allergies. For allergies    . clopidogrel (PLAVIX) 75 MG tablet Take 75 mg by mouth daily.    Marland Kitchen  diazepam (VALIUM) 2 MG tablet Take 1 tablet (2 mg total) by mouth every 8 (eight) hours as needed (vertigo). 15 tablet 0  . levothyroxine (SYNTHROID, LEVOTHROID) 75 MCG tablet Take 50 mcg by mouth daily before breakfast.     . lisinopril (PRINIVIL,ZESTRIL) 20 MG tablet Take by mouth.    . metoprolol succinate (TOPROL-XL) 25 MG 24 hr tablet Take 25 mg by mouth daily.     . Multiple Vitamins-Minerals (ALIVE WOMENS ENERGY) TABS Take 1 tablet by mouth daily.    . Probiotic Product (PROBIOTIC PO) Take 1 capsule by mouth daily.    . simvastatin (ZOCOR) 20 MG tablet Take 20 mg by mouth daily.     No current facility-administered medications for this visit.     Family History  Problem Relation Age of  Onset  . Heart failure Mother   . Cancer Father     Social History   Social History  . Marital status: Single    Spouse name: N/A  . Number of children: N/A  . Years of education: N/A   Occupational History  . Not on file.   Social History Main Topics  . Smoking status: Former Smoker    Packs/day: 0.30    Years: 5.00    Types: Cigarettes    Quit date: 04/12/1980  . Smokeless tobacco: Never Used  . Alcohol use No  . Drug use: No  . Sexual activity: Not on file   Other Topics Concern  . Not on file   Social History Narrative  . No narrative on file     REVIEW OF SYSTEMS:   _0  denotes positive finding, _1  denotes negative finding Cardiac  Comments:  Chest pain or chest pressure:    Shortness of breath upon exertion:    Short of breath when lying flat:    Irregular heart rhythm:        Vascular    Pain in calf, thigh, or hip brought on by ambulation:    Pain in feet at night that wakes you up from your sleep:     Blood clot in your veins:    Leg swelling:         Pulmonary    Oxygen at home:    Productive cough:     Wheezing:         Neurologic    Sudden weakness in arms or legs:     Sudden numbness in arms or legs:     Sudden onset of difficulty speaking or slurred speech:    Temporary loss of vision in one eye:     Problems with dizziness:  x See HPI (resolved)      Gastrointestinal    Blood in stool:     Vomited blood:         Genitourinary    Burning when urinating:     Blood in urine:        Psychiatric    Major depression:         Hematologic    Bleeding problems:    Problems with blood clotting too easily:        Skin    Rashes or ulcers:        Constitutional    Fever or chills:      PHYSICAL EXAMINATION: Vitals:   08/09/16 1454 08/09/16 1457  BP: (!) 161/82 (!) 157/78  Pulse: 86   Resp: 20   Temp: 97.6 F (36.4 C)    Vitals:  08/09/16 1454  Weight: 147 lb (66.7 kg)  Height: _0  (1.575 m)    General:  WDWN in  NAD; vital signs documented above Gait: Not observed HENT: WNL, normocephalic Pulmonary: normal non-labored breathing , without Rales, rhonchi,  wheezing Cardiac: regular HR, without  Murmurs, rubs or gallops; with right carotid bruit  Abdomen: soft, NT, no masses; aorta is palpable Skin: without rashes Vascular Exam/Pulses:  Right Left  Radial 2+ (normal) 2+ (normal)  Ulnar Unable to palpate  Unable to palpate   Femoral 2+ (normal) 2+ (normal)  DP 2+ (normal) 2+ (normal)  PT Unable to palpate  Unable to palpate    Extremities: without ischemic changes, without Gangrene , without cellulitis; without open wounds;  Musculoskeletal: no muscle wasting or atrophy  Neurologic: A&O X 3;  No focal weakness or paresthesias are detected Psychiatric:  The pt has Normal affect.   Non-Invasive Vascular Imaging:   Carotid Duplex on 08/09/16: Right and left ICA velocities suggest 1-39% stenosis **No significant change from previous exam on 02/03/16. Additional Findings:  Elevated velocities at the carotid-subclavian transposition anastomosis 518cm/s with calcified plaque observed.  Difficult to evaluate the proximal CCA stent due to anatomy.  Pt meds includes: Statin:  Yes.   Beta Blocker:  Yes.   Aspirin:  Yes.   ACEI:  Yes.   ARB:  No. CCB use:  No Other Antiplatelet/Anticoagulant:  Yes Plavix   ASSESSMENT/PLAN:: 76 y.o. female who is s/p right carotid to subclavian anastomosis on 12/17/09 by Dr. Kellie Simmering and right common carotid stent placement by Dr. Trula Slade on 05/11/11.    -pt presents today for follow up.  She has no complaints and is doing well.  She denies amaurosis fugax, hemiplegia/hemiparesis or speech difficulty.  She did have vertigo in January with a right middle ear effusion and this has resolved.   -her carotid duplex is essentially unchanged from the last exam.  She does have elevated velocities at the carotid-subclavian transposition anastomosis measuring 518cm/s.  (477cm/s  last visit).  Dr. Trula Slade has reviewed the images and we will see her back in 6 months with repeat duplex.  Symptoms of stroke reviewed and she knows to proceed to the ER if she develops any of these. -upon physical exam today, her aorta is palpable.  She did have CT scan of the abdomen in June 2017, which revealed a normal aorta.  Dr. Trula Slade also examined pt and given her aorta was normal less than a year ago and she is asymptomatic, we will get a AAA ultrasound in 6 months when she returns.  Discussed with her and her family member should she develop abdominal or back pain, she should go to the ER immediately.  -pt expressed understanding and she will return in 6 months    Leontine Locket, PA-C [facility] MD:  Trula Slade

## 2016-08-10 NOTE — Addendum Note (Signed)
Addended by: Lianne Cure A on: 08/10/2016 09:25 AM   Modules accepted: Orders

## 2016-08-26 ENCOUNTER — Ambulatory Visit
Admission: RE | Admit: 2016-08-26 | Discharge: 2016-08-26 | Disposition: A | Discharge status: Home / Self Care | Payer: Medicare Other | Source: Ambulatory Visit | Attending: Family Medicine | Admitting: Family Medicine

## 2016-08-26 DIAGNOSIS — M8580 Other specified disorders of bone density and structure, unspecified site: Secondary | ICD-10-CM

## 2016-08-26 DIAGNOSIS — Z1231 Encounter for screening mammogram for malignant neoplasm of breast: Secondary | ICD-10-CM | POA: Diagnosis not present

## 2016-08-26 DIAGNOSIS — M8589 Other specified disorders of bone density and structure, multiple sites: Secondary | ICD-10-CM | POA: Diagnosis not present

## 2017-01-04 DIAGNOSIS — I10 Essential (primary) hypertension: Secondary | ICD-10-CM | POA: Diagnosis not present

## 2017-01-04 DIAGNOSIS — E782 Mixed hyperlipidemia: Secondary | ICD-10-CM | POA: Diagnosis not present

## 2017-01-04 DIAGNOSIS — E039 Hypothyroidism, unspecified: Secondary | ICD-10-CM | POA: Diagnosis not present

## 2017-01-11 DIAGNOSIS — Z961 Presence of intraocular lens: Secondary | ICD-10-CM | POA: Diagnosis not present

## 2017-01-11 DIAGNOSIS — H401131 Primary open-angle glaucoma, bilateral, mild stage: Secondary | ICD-10-CM | POA: Diagnosis not present

## 2017-01-11 DIAGNOSIS — H04123 Dry eye syndrome of bilateral lacrimal glands: Secondary | ICD-10-CM | POA: Diagnosis not present

## 2017-02-14 ENCOUNTER — Encounter: Payer: Self-pay | Admitting: Family

## 2017-02-14 ENCOUNTER — Ambulatory Visit (INDEPENDENT_AMBULATORY_CARE_PROVIDER_SITE_OTHER): Payer: Medicare Other | Admitting: Family

## 2017-02-14 ENCOUNTER — Ambulatory Visit (HOSPITAL_COMMUNITY)
Admission: RE | Admit: 2017-02-14 | Discharge: 2017-02-14 | Disposition: A | Discharge status: Home / Self Care | Payer: Medicare Other | Source: Ambulatory Visit | Attending: Family | Admitting: Family

## 2017-02-14 VITALS — BP 156/74 | HR 71 | Temp 97.6°F | Resp 18 | Ht 62.0 in | Wt 141.0 lb

## 2017-02-14 DIAGNOSIS — Z959 Presence of cardiac and vascular implant and graft, unspecified: Secondary | ICD-10-CM | POA: Diagnosis not present

## 2017-02-14 DIAGNOSIS — I708 Atherosclerosis of other arteries: Secondary | ICD-10-CM | POA: Insufficient documentation

## 2017-02-14 DIAGNOSIS — I700 Atherosclerosis of aorta: Secondary | ICD-10-CM | POA: Insufficient documentation

## 2017-02-14 DIAGNOSIS — I6523 Occlusion and stenosis of bilateral carotid arteries: Secondary | ICD-10-CM | POA: Insufficient documentation

## 2017-02-14 DIAGNOSIS — I6529 Occlusion and stenosis of unspecified carotid artery: Secondary | ICD-10-CM

## 2017-02-14 LAB — VAS US CAROTID
LCCAPDIAS: 20 cm/s
LEFT ECA DIAS: -6 cm/s
LICADDIAS: -17 cm/s
LICADSYS: -72 cm/s
LICAPSYS: 111 cm/s
Left CCA dist dias: -24 cm/s
Left CCA dist sys: -135 cm/s
Left CCA prox sys: 134 cm/s
Left ICA prox dias: 14 cm/s
RCCAPDIAS: 25 cm/s
RCCAPSYS: 221 cm/s
RIGHT CCA MID DIAS: 19 cm/s
RIGHT ECA DIAS: -9 cm/s
Right cca dist sys: -81 cm/s

## 2017-02-14 NOTE — Progress Notes (Signed)
[facility] [place] [facility] HISTORY AND PHYSICAL   MRN : [id number]  History of Present Illness:   [name] is a 76 y.o. female patient of Dr. 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 She has no arm claudication symptoms since this procedure. 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 Trula Slade 05/11/2011. She returns today for follow up.  The patient denies any history of TIA or stroke symptoms. Specifically the patient denies a history of amaurosis fugax or monocular blindness, unilateral facial drooping, hemiplegia, or receptive or expressive aphasia.   She walks 2+ miles daily, she denies claudication symptoms with walking, denies non healing wounds.   Pt states her blood pressure at home is about 875-643 systolic.   Pt was last evaluated by S. Rhyne and Dr. Trula Slade on 08-09-16. At that time her carotid duplex was essentially unchanged from the previous exam.  She had elevated velocities at the carotid-subclavian transposition anastomosis measuring 518cm/s.  (477cm/s last visit).  Dr. Trula Slade reviewed the images and we will see her back in 6 months with repeat duplex.  Symptoms of stroke reviewed and she knows to proceed to the ER if she develops any of these. Upon physical exam that day, her aorta was palpable.  She did have CT scan of the abdomen in June 2017, which revealed a normal aorta.  Dr. Trula Slade also examined pt and given her aorta was normal less than a year ago and she was asymptomatic, we will get a AAA ultrasound in 6 months when she returns.  Discussed with her and her family member should she develop abdominal or back pain, she should go to the ER immediately.       Pt Diabetic: No Pt smoker: former smoker, quit about 1982  Pt meds include:  Statin :yes   Betablocker: Yes  ASA: Yes  Other anticoagulants/antiplatelets: Plavix   Current Outpatient Medications  Medication Sig Dispense Refill  . acetaminophen (TYLENOL) 325 MG tablet Take 325 mg by mouth every 6 (six) hours as needed for mild pain or headache. For pain    . aspirin 81 MG chewable tablet Chew 81 mg by mouth daily.    . benzonatate (TESSALON) 100 MG capsule Take 1 capsule (100 mg total) by mouth every 8 (eight) hours. 21 capsule 0  . calcium-vitamin D (OSCAL WITH D) 500-200 MG-UNIT per tablet Take 1 tablet by mouth daily with breakfast.    . cetirizine (ZYRTEC) 10 MG tablet Take 10 mg by mouth daily as needed for allergies. For allergies    . clopidogrel (PLAVIX) 75 MG tablet Take 75 mg by mouth daily.    . diazepam (VALIUM) 2 MG tablet Take 1 tablet (2 mg total) by mouth every 8 (eight) hours as needed (vertigo). 15 tablet 0  . levothyroxine (SYNTHROID, LEVOTHROID) 75 MCG tablet Take 50 mcg by mouth daily before breakfast.     . lisinopril (PRINIVIL,ZESTRIL) 20 MG tablet Take by mouth.    . metoprolol succinate (TOPROL-XL) 25 MG 24 hr tablet Take 25 mg by mouth daily.     . Multiple Vitamins-Minerals (ALIVE WOMENS ENERGY) TABS Take 1 tablet by mouth daily.    . Probiotic Product (PROBIOTIC PO) Take 1 capsule by mouth daily.    . simvastatin (ZOCOR) 20 MG tablet Take 20 mg by mouth daily.  No current facility-administered medications for this visit.     Past Medical History:  Diagnosis Date  . Allergy   . Arthritis   . Carotid artery occlusion   . GERD (gastroesophageal reflux disease)   . Hypertension   . Joint pain   . Thyroid disease     Social History Social History   Tobacco Use  . Smoking status: Former Smoker    Packs/day: 0.30    Years: 5.00    Pack years: 1.50    Types: Cigarettes    Last attempt to quit: 04/12/1980    Years since quitting: 36.8  . Smokeless tobacco: Never Used  Substance Use Topics  . Alcohol use: No    Alcohol/week: 0.0 oz   . Drug use: No    Family History Family History  Problem Relation Age of Onset  . Heart failure Mother   . Cancer Father     Surgical History Past Surgical History:  Procedure Laterality Date  . ABDOMINAL HYSTERECTOMY    . CAROTID ANGIOGRAM  July 04, 2012  . carotid subclavian anastomosis  05/11/11   Right subclav. artery transected 12/17/09  . CATARACT EXTRACTION     bilateral  . EYE SURGERY      Allergies  Allergen Reactions  . Meclizine Nausea And Vomiting    Current Outpatient Medications  Medication Sig Dispense Refill  . acetaminophen (TYLENOL) 325 MG tablet Take 325 mg by mouth every 6 (six) hours as needed for mild pain or headache. For pain    . aspirin 81 MG chewable tablet Chew 81 mg by mouth daily.    . benzonatate (TESSALON) 100 MG capsule Take 1 capsule (100 mg total) by mouth every 8 (eight) hours. 21 capsule 0  . calcium-vitamin D (OSCAL WITH D) 500-200 MG-UNIT per tablet Take 1 tablet by mouth daily with breakfast.    . cetirizine (ZYRTEC) 10 MG tablet Take 10 mg by mouth daily as needed for allergies. For allergies    . clopidogrel (PLAVIX) 75 MG tablet Take 75 mg by mouth daily.    . diazepam (VALIUM) 2 MG tablet Take 1 tablet (2 mg total) by mouth every 8 (eight) hours as needed (vertigo). 15 tablet 0  . levothyroxine (SYNTHROID, LEVOTHROID) 75 MCG tablet Take 50 mcg by mouth daily before breakfast.     . lisinopril (PRINIVIL,ZESTRIL) 20 MG tablet Take by mouth.    . metoprolol succinate (TOPROL-XL) 25 MG 24 hr tablet Take 25 mg by mouth daily.     . Multiple Vitamins-Minerals (ALIVE WOMENS ENERGY) TABS Take 1 tablet by mouth daily.    . Probiotic Product (PROBIOTIC PO) Take 1 capsule by mouth daily.    . simvastatin (ZOCOR) 20 MG tablet Take 20 mg by mouth daily.     No current facility-administered medications for this visit.      REVIEW OF SYSTEMS: See HPI for pertinent positives and negatives.  Physical Examination Vitals:   02/14/17 0909  02/14/17 0911  BP: (!) 160/74 (!) 156/74  Pulse: 71   Resp: 18   Temp: 97.6 F (36.4 C)   TempSrc: Oral   SpO2: 95%   Weight: 141 lb (64 kg)   Height: _0  (1.575 m)    Body mass index is 25.79 kg/m.  General:  WDWN female in NAD  GAIT:normal  Eyes: PERRLA  Pulmonary: Respirations are non labored, CTAB, good air movement in all fields.  Cardiac: regular rhythm, + murmur.   VASCULAR EXAM Carotid Bruits Left  Right   Positive, soft  Positive, harsh    Radial pulses: right is weakly palpable, left is 2+ palpable.  Brachial pulses: 1+ palpable on right, left brachial pulse is 2+ palpable. Abdominal aortic pulse is palpable  LE Pulses  LEFT  RIGHT   POPLITEAL  not palpable  not palpable  POSTERIOR TIBIAL  not palpable  palpable   DORSALIS PEDIS ANTERIOR TIBIAL  palpable  palpable    Gastrointestinal: soft, nontender, BS WNL, no r/g, no masses palpated.  Musculoskeletal: No muscle atrophy/wasting. M/S 5/5 throughout, Extremities without ischemic changes.  Neurologic: A&O X 3; appropriate affect, sensation is normal, speech is normal, CN 2-12 intact except slightly hard of hearing, Pain and light touch intact in extremities, Motor exam as listed above     ASSESSMENT:  [name] is a 76 y.o. 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 She has no arm steal symptoms since this procedure. 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 Trula Slade 05/11/2011.  No significant difference in bilateral brachial pressures. She has no history of stroke or TIA. She remains very active.   DATA  Carotid Duplex (02/14/17): 1-39% bilateral internal carotid artery stenosis. Velocity of 485 cm/s at the carotid/subclaian transposition anastomosis. Patent right proximal  CCA stent with what appears to be mobile thrombus at the distal end with velocity of 222 cm/s. Right carotid stent not well visualized on previous exam on 08-09-16, otherwise no significant change.    AAA Duplex (02/14/17): No evidence of abdominal aortic aneurysm, bilateral CIA diameters WNL.  No previous exam for comparison.   PLAN:   Based on today's exam and non-invasive vascular lab results, the patient will follow up in 6 months with the following tests: carotid duplex. Since no aneurysmal dilation of abdominal aorta and common iliac arteries, will not need to check AAA duplex again. I discussed in depth with the patient the nature of atherosclerosis, and emphasized the importance of maximal medical management including strict control of blood pressure, blood glucose, and lipid levels, obtaining regular exercise, and cessation of smoking.  The patient is aware that without maximal medical management the underlying atherosclerotic disease process will progress, limiting the benefit of any interventions.  The patient was given information about stroke prevention and what symptoms should prompt the patient to seek immediate medical care.  Thank you for allowing [place] to participate in this patient's care.  Clemon Chambers, RN, MSN, FNP-C [facility] Office: [phone]  Clinic MD: Trula Slade 02/14/2017 9:28 AM

## 2017-02-14 NOTE — Patient Instructions (Signed)
Stroke Prevention Some health problems and behaviors may make it more likely for you to have a stroke. Below are ways to lessen your risk of having a stroke.  Be active for at least 30 minutes on most or all days.  Do not smoke. Try not to be around others who smoke.  Do not drink too much alcohol. ? Do not have more than 2 drinks a day if you are a man. ? Do not have more than 1 drink a day if you are a woman and are not pregnant.  Eat healthy foods, such as fruits and vegetables. If you were put on a specific diet, follow the diet as told.  Keep your cholesterol levels under control through diet and medicines. Look for foods that are low in saturated fat, trans fat, cholesterol, and are high in fiber.  If you have diabetes, follow all diet plans and take your medicine as told.  Ask your doctor if you need treatment to lower your blood pressure. If you have high blood pressure (hypertension), follow all diet plans and take your medicine as told by your doctor.  If you are 88-10 years old, have your blood pressure checked every 3-5 years. If you are age 53 or older, have your blood pressure checked every year.  Keep a healthy weight. Eat foods that are low in calories, salt, saturated fat, trans fat, and cholesterol.  Do not take drugs.  Avoid birth control pills, if this applies. Talk to your doctor about the risks of taking birth control pills.  Talk to your doctor if you have sleep problems (sleep apnea).  Take all medicine as told by your doctor. ? You may be told to take aspirin or blood thinner medicine. Take this medicine as told by your doctor. ? Understand your medicine instructions.  Make sure any other conditions you have are being taken care of.  Get help right away if:  You suddenly lose feeling (you feel numb) or have weakness in your face, arm, or leg.  Your face or eyelid hangs down to one side.  You suddenly feel confused.  You have trouble talking  (aphasia) or understanding what people are saying.  You suddenly have trouble seeing in one or both eyes.  You suddenly have trouble walking.  You are dizzy.  You lose your balance or your movements are clumsy (uncoordinated).  You suddenly have a very bad headache and you do not know the cause.  You have new chest pain.  Your heart feels like it is fluttering or skipping a beat (irregular heartbeat). Do not wait to see if the symptoms above go away. Get help right away. Call your local emergency services (911 in [place] Do not drive yourself to the hospital. This information is not intended to replace advice given to you by your health care provider. Make sure you discuss any questions you have with your health care provider. Document Released: 09/28/2011 Document Revised: 09/04/2015 Document Reviewed: 09/29/2012 Elsevier Interactive Patient Education  [employer].

## 2017-02-14 NOTE — H&P (View-Only) (Signed)
[facility] [place] HISTORY AND PHYSICAL   MRN : [id number]  History of Present Illness:   [name] is a 76 y.o. female patient of Dr. 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 She has no arm claudication symptoms since this procedure. 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 Brabham 05/11/2011. She returns today for follow up.  The patient denies any history of TIA or stroke symptoms. Specifically the patient denies a history of amaurosis fugax or monocular blindness, unilateral facial drooping, hemiplegia, or receptive or expressive aphasia.   She walks 2+ miles daily, she denies claudication symptoms with walking, denies non healing wounds.   Pt states her blood pressure at home is about 120-130 systolic.   Pt was last evaluated by S. Rhyne and Dr. Brabham on 08-09-16. At that time her carotid duplex was essentially unchanged from the previous exam.  She had elevated velocities at the carotid-subclavian transposition anastomosis measuring 518cm/s.  (477cm/s last visit).  Dr. Brabham reviewed the images and we will see her back in 6 months with repeat duplex.  Symptoms of stroke reviewed and she knows to proceed to the ER if she develops any of these. Upon physical exam that day, her aorta was palpable.  She did have CT scan of the abdomen in June 2017, which revealed a normal aorta.  Dr. Brabham also examined pt and given her aorta was normal less than a year ago and she was asymptomatic, we will get a AAA ultrasound in 6 months when she returns.  Discussed with her and her family member should she develop abdominal or back pain, she should go to the ER immediately.       Pt Diabetic: No Pt smoker: former smoker, quit about 1982  Pt meds include:  Statin :yes   Betablocker: Yes  ASA: Yes  Other anticoagulants/antiplatelets: Plavix   Current Outpatient Medications  Medication Sig Dispense Refill  . acetaminophen (TYLENOL) 325 MG tablet Take 325 mg by mouth every 6 (six) hours as needed for mild pain or headache. For pain    . aspirin 81 MG chewable tablet Chew 81 mg by mouth daily.    . benzonatate (TESSALON) 100 MG capsule Take 1 capsule (100 mg total) by mouth every 8 (eight) hours. 21 capsule 0  . calcium-vitamin D (OSCAL WITH D) 500-200 MG-UNIT per tablet Take 1 tablet by mouth daily with breakfast.    . cetirizine (ZYRTEC) 10 MG tablet Take 10 mg by mouth daily as needed for allergies. For allergies    . clopidogrel (PLAVIX) 75 MG tablet Take 75 mg by mouth daily.    . diazepam (VALIUM) 2 MG tablet Take 1 tablet (2 mg total) by mouth every 8 (eight) hours as needed (vertigo). 15 tablet 0  . levothyroxine (SYNTHROID, LEVOTHROID) 75 MCG tablet Take 50 mcg by mouth daily before breakfast.     . lisinopril (PRINIVIL,ZESTRIL) 20 MG tablet Take by mouth.    . metoprolol succinate (TOPROL-XL) 25 MG 24 hr tablet Take 25 mg by mouth daily.     . Multiple Vitamins-Minerals (ALIVE WOMENS ENERGY) TABS Take 1 tablet by mouth daily.    . Probiotic Product (PROBIOTIC PO) Take 1 capsule by mouth daily.    . simvastatin (ZOCOR) 20 MG tablet Take 20 mg by mouth daily.       No current facility-administered medications for this visit.     Past Medical History:  Diagnosis Date  . Allergy   . Arthritis   . Carotid artery occlusion   . GERD (gastroesophageal reflux disease)   . Hypertension   . Joint pain   . Thyroid disease     Social History Social History   Tobacco Use  . Smoking status: Former Smoker    Packs/day: 0.30    Years: 5.00    Pack years: 1.50    Types: Cigarettes    Last attempt to quit: 04/12/1980    Years since quitting: 36.8  . Smokeless tobacco: Never Used  Substance Use Topics  . Alcohol use: No    Alcohol/week: 0.0 oz   . Drug use: No    Family History Family History  Problem Relation Age of Onset  . Heart failure Mother   . Cancer Father     Surgical History Past Surgical History:  Procedure Laterality Date  . ABDOMINAL HYSTERECTOMY    . CAROTID ANGIOGRAM  July 04, 2012  . carotid subclavian anastomosis  05/11/11   Right subclav. artery transected 12/17/09  . CATARACT EXTRACTION     bilateral  . EYE SURGERY      Allergies  Allergen Reactions  . Meclizine Nausea And Vomiting    Current Outpatient Medications  Medication Sig Dispense Refill  . acetaminophen (TYLENOL) 325 MG tablet Take 325 mg by mouth every 6 (six) hours as needed for mild pain or headache. For pain    . aspirin 81 MG chewable tablet Chew 81 mg by mouth daily.    . benzonatate (TESSALON) 100 MG capsule Take 1 capsule (100 mg total) by mouth every 8 (eight) hours. 21 capsule 0  . calcium-vitamin D (OSCAL WITH D) 500-200 MG-UNIT per tablet Take 1 tablet by mouth daily with breakfast.    . cetirizine (ZYRTEC) 10 MG tablet Take 10 mg by mouth daily as needed for allergies. For allergies    . clopidogrel (PLAVIX) 75 MG tablet Take 75 mg by mouth daily.    . diazepam (VALIUM) 2 MG tablet Take 1 tablet (2 mg total) by mouth every 8 (eight) hours as needed (vertigo). 15 tablet 0  . levothyroxine (SYNTHROID, LEVOTHROID) 75 MCG tablet Take 50 mcg by mouth daily before breakfast.     . lisinopril (PRINIVIL,ZESTRIL) 20 MG tablet Take by mouth.    . metoprolol succinate (TOPROL-XL) 25 MG 24 hr tablet Take 25 mg by mouth daily.     . Multiple Vitamins-Minerals (ALIVE WOMENS ENERGY) TABS Take 1 tablet by mouth daily.    . Probiotic Product (PROBIOTIC PO) Take 1 capsule by mouth daily.    . simvastatin (ZOCOR) 20 MG tablet Take 20 mg by mouth daily.     No current facility-administered medications for this visit.      REVIEW OF SYSTEMS: See HPI for pertinent positives and negatives.  Physical Examination Vitals:   02/14/17 0909  02/14/17 0911  BP: (!) 160/74 (!) 156/74  Pulse: 71   Resp: 18   Temp: 97.6 F (36.4 C)   TempSrc: Oral   SpO2: 95%   Weight: 141 lb (64 kg)   Height: 5' 2" (1.575 m)    Body mass index is 25.79 kg/m.  General:  WDWN female in NAD  GAIT:normal  Eyes: PERRLA  Pulmonary: Respirations are non labored, CTAB, good air movement in all fields.  Cardiac: regular rhythm, + murmur.   VASCULAR EXAM Carotid Bruits Left   Right   Positive, soft  Positive, harsh    Radial pulses: right is weakly palpable, left is 2+ palpable.  Brachial pulses: 1+ palpable on right, left brachial pulse is 2+ palpable. Abdominal aortic pulse is palpable  LE Pulses  LEFT  RIGHT   POPLITEAL  not palpable  not palpable  POSTERIOR TIBIAL  not palpable  palpable   DORSALIS PEDIS ANTERIOR TIBIAL  palpable  palpable    Gastrointestinal: soft, nontender, BS WNL, no r/g, no masses palpated.  Musculoskeletal: No muscle atrophy/wasting. M/S 5/5 throughout, Extremities without ischemic changes.  Neurologic: A&O X 3; appropriate affect, sensation is normal, speech is normal, CN 2-12 intact except slightly hard of hearing, Pain and light touch intact in extremities, Motor exam as listed above     ASSESSMENT:  [name] is a 76 y.o. 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 She has no arm steal symptoms since this procedure. 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 Brabham 05/11/2011.  No significant difference in bilateral brachial pressures. She has no history of stroke or TIA. She remains very active.   DATA  Carotid Duplex (02/14/17): 1-39% bilateral internal carotid artery stenosis. Velocity of 485 cm/s at the carotid/subclaian transposition anastomosis. Patent right proximal  CCA stent with what appears to be mobile thrombus at the distal end with velocity of 222 cm/s. Right carotid stent not well visualized on previous exam on 08-09-16, otherwise no significant change.    AAA Duplex (02/14/17): No evidence of abdominal aortic aneurysm, bilateral CIA diameters WNL.  No previous exam for comparison.   PLAN:   Based on today's exam and non-invasive vascular lab results, the patient will follow up in 6 months with the following tests: carotid duplex. Since no aneurysmal dilation of abdominal aorta and common iliac arteries, will not need to check AAA duplex again. I discussed in depth with the patient the nature of atherosclerosis, and emphasized the importance of maximal medical management including strict control of blood pressure, blood glucose, and lipid levels, obtaining regular exercise, and cessation of smoking.  The patient is aware that without maximal medical management the underlying atherosclerotic disease process will progress, limiting the benefit of any interventions.  The patient was given information about stroke prevention and what symptoms should prompt the patient to seek immediate medical care.  Thank you for allowing us to participate in this patient's care.  Elke Holtry, RN, MSN, FNP-C [facility] Office: [phone]  Clinic MD: Brabham 02/14/2017 9:28 AM   

## 2017-02-16 ENCOUNTER — Other Ambulatory Visit: Payer: Self-pay | Admitting: *Deleted

## 2017-02-16 NOTE — Progress Notes (Signed)
Call to patient instructed to be at [facility] admitting office at [place] for angiogram. NPO past MN except heart and blood pressure medications with sip of water and hold other until after the test. Must have a driver and someone with her evening of . Verbalized understanding.

## 2017-02-22 NOTE — Addendum Note (Signed)
Addended by: Lianne Cure A on: 02/22/2017 03:56 PM   Modules accepted: Orders

## 2017-03-01 ENCOUNTER — Encounter (HOSPITAL_COMMUNITY): Payer: Self-pay | Admitting: Surgery

## 2017-03-01 ENCOUNTER — Ambulatory Visit (HOSPITAL_COMMUNITY)
Admission: RE | Admit: 2017-03-01 | Discharge: 2017-03-01 | Disposition: A | Discharge status: Home / Self Care | Payer: Medicare Other | Source: Ambulatory Visit | Attending: Surgery | Admitting: Surgery

## 2017-03-01 ENCOUNTER — Encounter (HOSPITAL_COMMUNITY)
Admission: RE | Disposition: A | Discharge status: Home / Self Care | Payer: Self-pay | Source: Ambulatory Visit | Attending: Surgery

## 2017-03-01 DIAGNOSIS — Z7902 Long term (current) use of antithrombotics/antiplatelets: Secondary | ICD-10-CM | POA: Diagnosis not present

## 2017-03-01 DIAGNOSIS — Z7982 Long term (current) use of aspirin: Secondary | ICD-10-CM | POA: Diagnosis not present

## 2017-03-01 DIAGNOSIS — Z87891 Personal history of nicotine dependence: Secondary | ICD-10-CM | POA: Diagnosis not present

## 2017-03-01 DIAGNOSIS — Z86718 Personal history of other venous thrombosis and embolism: Secondary | ICD-10-CM | POA: Diagnosis not present

## 2017-03-01 DIAGNOSIS — I10 Essential (primary) hypertension: Secondary | ICD-10-CM | POA: Insufficient documentation

## 2017-03-01 DIAGNOSIS — M1990 Unspecified osteoarthritis, unspecified site: Secondary | ICD-10-CM | POA: Insufficient documentation

## 2017-03-01 DIAGNOSIS — E079 Disorder of thyroid, unspecified: Secondary | ICD-10-CM | POA: Insufficient documentation

## 2017-03-01 DIAGNOSIS — I739 Peripheral vascular disease, unspecified: Secondary | ICD-10-CM | POA: Diagnosis not present

## 2017-03-01 DIAGNOSIS — K219 Gastro-esophageal reflux disease without esophagitis: Secondary | ICD-10-CM | POA: Diagnosis not present

## 2017-03-01 DIAGNOSIS — I6523 Occlusion and stenosis of bilateral carotid arteries: Secondary | ICD-10-CM | POA: Insufficient documentation

## 2017-03-01 DIAGNOSIS — T82898A Other specified complication of vascular prosthetic devices, implants and grafts, initial encounter: Secondary | ICD-10-CM | POA: Diagnosis not present

## 2017-03-01 HISTORY — PX: AORTIC ARCH ANGIOGRAPHY: CATH118224

## 2017-03-01 LAB — POCT I-STAT, CHEM 8
BUN: 11 mg/dL (ref 6–20)
CREATININE: 0.8 mg/dL (ref 0.44–1.00)
Calcium, Ion: 1.19 mmol/L (ref 1.15–1.40)
Chloride: 110 mmol/L (ref 101–111)
GLUCOSE: 95 mg/dL (ref 65–99)
HCT: 38 % (ref 36.0–46.0)
HEMOGLOBIN: 12.9 g/dL (ref 12.0–15.0)
Potassium: 3.8 mmol/L (ref 3.5–5.1)
Sodium: 143 mmol/L (ref 135–145)
TCO2: 23 mmol/L (ref 22–32)

## 2017-03-01 LAB — POCT ACTIVATED CLOTTING TIME: ACTIVATED CLOTTING TIME: 147 s

## 2017-03-01 SURGERY — AORTIC ARCH ANGIOGRAPHY
Anesthesia: LOCAL

## 2017-03-01 MED ORDER — HYDRALAZINE HCL 20 MG/ML IJ SOLN: 5.0000 mg | INTRAMUSCULAR | Status: DC | PRN

## 2017-03-01 MED ORDER — FENTANYL CITRATE (PF) 100 MCG/2ML IJ SOLN
INTRAMUSCULAR | Status: DC | PRN
  Administered 2017-03-01: 25 ug via INTRAVENOUS

## 2017-03-01 MED ORDER — HEPARIN (PORCINE) IN NACL 2-0.9 UNIT/ML-% IJ SOLN
INTRAMUSCULAR | Status: AC
  Filled 2017-03-01: qty 1000

## 2017-03-01 MED ORDER — SODIUM CHLORIDE 0.9% FLUSH: 3.0000 mL | INTRAVENOUS | Status: DC | PRN

## 2017-03-01 MED ORDER — LABETALOL HCL 5 MG/ML IV SOLN: 10.0000 mg | INTRAVENOUS | Status: DC | PRN

## 2017-03-01 MED ORDER — HEPARIN (PORCINE) IN NACL 2-0.9 UNIT/ML-% IJ SOLN
INTRAMUSCULAR | Status: AC | PRN
  Administered 2017-03-01: 1000 mL via INTRA_ARTERIAL

## 2017-03-01 MED ORDER — SODIUM CHLORIDE 0.9 % IV SOLN
INTRAVENOUS | Status: DC
  Administered 2017-03-01: 07:00:00 via INTRAVENOUS

## 2017-03-01 MED ORDER — LIDOCAINE HCL (PF) 1 % IJ SOLN
INTRAMUSCULAR | Status: DC | PRN
  Administered 2017-03-01: 5 mL via INTRADERMAL

## 2017-03-01 MED ORDER — SODIUM CHLORIDE 0.9% FLUSH: 3.0000 mL | Freq: Two times a day (BID) | INTRAVENOUS | Status: DC

## 2017-03-01 MED ORDER — MIDAZOLAM HCL 2 MG/2ML IJ SOLN
INTRAMUSCULAR | Status: AC
  Filled 2017-03-01: qty 2

## 2017-03-01 MED ORDER — FENTANYL CITRATE (PF) 100 MCG/2ML IJ SOLN
INTRAMUSCULAR | Status: AC
  Filled 2017-03-01: qty 2

## 2017-03-01 MED ORDER — MIDAZOLAM HCL 2 MG/2ML IJ SOLN
INTRAMUSCULAR | Status: DC | PRN
  Administered 2017-03-01: 1 mg via INTRAVENOUS

## 2017-03-01 MED ORDER — NITROGLYCERIN 1 MG/10 ML FOR IR/CATH LAB
INTRA_ARTERIAL | Status: DC | PRN
  Administered 2017-03-01: 200 ug via INTRA_ARTERIAL

## 2017-03-01 MED ORDER — LIDOCAINE HCL (PF) 1 % IJ SOLN
INTRAMUSCULAR | Status: AC
  Filled 2017-03-01: qty 30

## 2017-03-01 MED ORDER — IODIXANOL 320 MG/ML IV SOLN
INTRAVENOUS | Status: DC | PRN
  Administered 2017-03-01: 30 mL via INTRA_ARTERIAL

## 2017-03-01 MED ORDER — NITROGLYCERIN 1 MG/10 ML FOR IR/CATH LAB
INTRA_ARTERIAL | Status: AC
  Filled 2017-03-01: qty 10

## 2017-03-01 MED ORDER — SODIUM CHLORIDE 0.9 % IV SOLN: 250.0000 mL | INTRAVENOUS | Status: DC | PRN

## 2017-03-01 MED ORDER — OXYCODONE HCL 5 MG PO TABS: 5.0000 mg | ORAL_TABLET | ORAL | Status: DC | PRN

## 2017-03-01 MED ORDER — MORPHINE SULFATE (PF) 10 MG/ML IV SOLN: 2.0000 mg | INTRAVENOUS | Status: DC | PRN

## 2017-03-01 MED ORDER — HEPARIN SODIUM (PORCINE) 1000 UNIT/ML IJ SOLN
INTRAMUSCULAR | Status: AC
  Filled 2017-03-01: qty 1

## 2017-03-01 MED ORDER — SODIUM CHLORIDE 0.9 % WEIGHT BASED INFUSION: 1.0000 mL/kg/h | INTRAVENOUS | Status: DC

## 2017-03-01 MED ORDER — HEPARIN SODIUM (PORCINE) 1000 UNIT/ML IJ SOLN
INTRAMUSCULAR | Status: DC | PRN
  Administered 2017-03-01: 2000 [IU] via INTRAVENOUS

## 2017-03-01 SURGICAL SUPPLY — 14 items
CATH ANGIO 5F BER2 65CM (CATHETERS) ×2 IMPLANT
CATH OMNI FLUSH 5F 65CM (CATHETERS) ×2 IMPLANT
COVER PRB 48X5XTLSCP FOLD TPE (BAG) ×1 IMPLANT
COVER PROBE 5X48 (BAG) ×1
DRAPE ZERO GRAVITY STERILE (DRAPES) ×2 IMPLANT
KIT MICROINTRODUCER STIFF 5F (SHEATH) ×2 IMPLANT
KIT PV (KITS) ×2 IMPLANT
SHEATH PINNACLE 5F 10CM (SHEATH) ×2 IMPLANT
STOPCOCK MORSE 400PSI 3WAY (MISCELLANEOUS) ×2 IMPLANT
SYR MEDRAD MARK V 150ML (SYRINGE) ×2 IMPLANT
TRANSDUCER W/STOPCOCK (MISCELLANEOUS) ×2 IMPLANT
TRAY PV CATH (CUSTOM PROCEDURE TRAY) ×2 IMPLANT
TUBING HIGH PRESSURE 120CM (CONNECTOR) ×2 IMPLANT
WIRE BENTSON .035X145CM (WIRE) ×2 IMPLANT

## 2017-03-01 NOTE — Discharge Instructions (Signed)
Right Arm Brachial Site Care Refer to this sheet in the next few weeks. These instructions provide you with information about caring for yourself after your procedure. Your health care provider may also give you more specific instructions. Your treatment has been planned according to current medical practices, but problems sometimes occur. Call your health care provider if you have any problems or questions after your procedure. What can I expect after the procedure? After your procedure, it is typical to have the following:  Bruising at the radial site that usually fades within 1-2 weeks.  Blood collecting in the tissue (hematoma) that may be painful to the touch. It should usually decrease in size and tenderness within 1-2 weeks.  Follow these instructions at home:  Take medicines only as directed by your health care provider.  You may shower 24-48 hours after the procedure or as directed by your health care provider. Remove the bandage (dressing) and gently wash the site with plain soap and water. Pat the area dry with a clean towel. Do not rub the site, because this may cause bleeding.  Do not take baths, swim, or use a hot tub until your health care provider approves.  Check your insertion site every day for redness, swelling, or drainage.  Do not apply powder or lotion to the site.  Do not flex or bend the affected arm for 24 hours or as directed by your health care provider.  Do not push or pull heavy objects with the affected arm for 24 hours or as directed by your health care provider.  Do not lift over 10 lb (4.5 kg) for 5 days after your procedure or as directed by your health care provider.  Ask your health care provider when it is okay to: ? Return to work or school. ? Resume usual physical activities or sports. ? Resume sexual activity.  Do not drive home if you are discharged the same day as the procedure. Have someone else drive you.  Do not operate machinery or  power tools for 24 hours after the procedure.  If your procedure was done as an outpatient procedure, which means that you went home the same day as your procedure, a responsible adult should be with you for the first 24 hours after you arrive home.  Keep all follow-up visits as directed by your health care provider. This is important. Contact a health care provider if:  You have a fever.  You have chills.  You have increased bleeding from the radial site. Hold pressure on the site. Get help right away if:  You have unusual pain at the radial site.  You have redness, warmth, or swelling at the radial site.  You have drainage (other than a small amount of blood on the dressing) from the radial site.  The radial site is bleeding, and the bleeding does not stop after 30 minutes of holding steady pressure on the site.  Your arm or hand becomes pale, cool, tingly, or numb. This information is not intended to replace advice given to you by your health care provider. Make sure you discuss any questions you have with your health care provider. Document Released: 05/01/2010 Document Revised: 09/04/2015 Document Reviewed: 10/15/2013 Elsevier Interactive Patient Education  2018 [employer].

## 2017-03-01 NOTE — Interval H&P Note (Signed)
History and Physical Interval Note:  03/01/2017 11:11 AM  [name]  has presented today for surgery, with the diagnosis of bilateral carotid stenosis  The various methods of treatment have been discussed with the patient and family. After consideration of risks, benefits and other options for treatment, the patient has consented to  Procedure(s): AORTIC ARCH ANGIOGRAPHY (N/A) as a surgical intervention .  The patient's history has been reviewed, patient examined, no change in status, stable for surgery.  I have reviewed the patient's chart and labs.  Questions were answered to the patient's satisfaction.     Annamarie Major

## 2017-03-01 NOTE — Op Note (Addendum)
    Patient name: [name] MRN: [id number] DOB: Nov 29, 1940 Sex: female  03/01/2017 Pre-operative Diagnosis: Possible in-stent stenosis Post-operative diagnosis:  Same Surgeon:  Annamarie Major Procedure Performed:  1.  Ultrasound-guided access, right brachial artery  2.  Aortic arch angiogram  3.  Right upper extremity angiogram  4.  Second-order catheterization  5.  Conscious sedation (15 minutes)   Indications: The patient has a history of a right carotid subclavian transposition as well as stenting of her right carotid artery proximal to the stenosis using a 9 x 30 self-expanding stent.  Ultrasound has identified elevated velocities as well as possible mobile thrombus.  She is here for further evaluation.  Procedure:  The patient was identified in the holding area and taken to room 8.  The patient was then placed supine on the table and prepped and draped in the usual sterile fashion.  A time out was called.  Conscious sedation was administered with the use of IV fentanyl and Versed under continuous physician and nurse monitoring.  Heart rate, blood pressure, and oxygen saturations were continuously monitored.   Ultrasound was used to evaluate the right brachial artery. It was patent without significant calcification. One percent lidocaine was used for local anesthesia. The right brachial artery was then cannulated under ultrasound guidance using a micropuncture needle. An 018 wire was advanced without resistance and a micropuncture sheath was placed. Next a Britta Mccreedy wire was inserted and a 5 French sheath was placed. 200 mcg of nitroglycerin and 400 units of heparin were administered through the sheath.  A Omni Flush catheter was advanced into the ascending aorta and aortic arch angiogram was performed.  Next a Berenstein catheter was used for pullback pressures.  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  There is no evidence of stenosis within  the proximal innominate artery.  Right common carotid artery is patent throughout its course with an associated stent.  The stent struts do not fully approximate the arterial wall.  No mobile thrombus is identified.  A pullback pressure across this area revealed less than 10 mm gradient.  A anastomosis between the right subclavian and right carotid artery is observed to be widely patent without significant stenosis.  The subclavian artery is patent beyond the ptosis.  Intervention: None  Impression:  #1 no hemodynamically significant lesions were identified within the stent or within the carotid subclavian anastomosis  #2 no mobile thrombus was visualized.    Theotis Burrow, M.D. [facility] [place] Office: [phone] Pager:  [phone]

## 2017-03-01 NOTE — Progress Notes (Signed)
51f sheath aspirated and removed from right brachial artery. Manual pressure applied for 30 minutes. Site level 0 tegaderm dressing applied. Bedrest instructions given.  Right radial pulse present with doppler. Spo2 as measured in right thumb is 98%.  Bedrest begins at 11:55:00

## 2017-06-06 ENCOUNTER — Ambulatory Visit: Payer: Medicare Other | Attending: Internal Medicine | Admitting: Internal Medicine

## 2017-06-06 ENCOUNTER — Encounter: Payer: Self-pay | Admitting: Internal Medicine

## 2017-06-06 VITALS — BP 174/79 | HR 73 | Temp 97.9°F | Resp 16 | Ht 62.0 in | Wt 142.6 lb

## 2017-06-06 DIAGNOSIS — Z79899 Other long term (current) drug therapy: Secondary | ICD-10-CM | POA: Diagnosis not present

## 2017-06-06 DIAGNOSIS — Z9841 Cataract extraction status, right eye: Secondary | ICD-10-CM | POA: Diagnosis not present

## 2017-06-06 DIAGNOSIS — I6529 Occlusion and stenosis of unspecified carotid artery: Secondary | ICD-10-CM | POA: Insufficient documentation

## 2017-06-06 DIAGNOSIS — R252 Cramp and spasm: Secondary | ICD-10-CM | POA: Diagnosis not present

## 2017-06-06 DIAGNOSIS — E039 Hypothyroidism, unspecified: Secondary | ICD-10-CM | POA: Diagnosis not present

## 2017-06-06 DIAGNOSIS — K5900 Constipation, unspecified: Secondary | ICD-10-CM | POA: Diagnosis present

## 2017-06-06 DIAGNOSIS — Z2821 Immunization not carried out because of patient refusal: Secondary | ICD-10-CM | POA: Diagnosis not present

## 2017-06-06 DIAGNOSIS — H3530 Unspecified macular degeneration: Secondary | ICD-10-CM | POA: Diagnosis not present

## 2017-06-06 DIAGNOSIS — Z9889 Other specified postprocedural states: Secondary | ICD-10-CM | POA: Insufficient documentation

## 2017-06-06 DIAGNOSIS — Z888 Allergy status to other drugs, medicaments and biological substances status: Secondary | ICD-10-CM | POA: Diagnosis not present

## 2017-06-06 DIAGNOSIS — Z809 Family history of malignant neoplasm, unspecified: Secondary | ICD-10-CM | POA: Diagnosis not present

## 2017-06-06 DIAGNOSIS — Z8249 Family history of ischemic heart disease and other diseases of the circulatory system: Secondary | ICD-10-CM | POA: Diagnosis not present

## 2017-06-06 DIAGNOSIS — Z90710 Acquired absence of both cervix and uterus: Secondary | ICD-10-CM | POA: Diagnosis not present

## 2017-06-06 DIAGNOSIS — Z7982 Long term (current) use of aspirin: Secondary | ICD-10-CM | POA: Diagnosis not present

## 2017-06-06 DIAGNOSIS — E559 Vitamin D deficiency, unspecified: Secondary | ICD-10-CM | POA: Insufficient documentation

## 2017-06-06 DIAGNOSIS — E785 Hyperlipidemia, unspecified: Secondary | ICD-10-CM | POA: Insufficient documentation

## 2017-06-06 DIAGNOSIS — R42 Dizziness and giddiness: Secondary | ICD-10-CM | POA: Diagnosis not present

## 2017-06-06 DIAGNOSIS — Z87891 Personal history of nicotine dependence: Secondary | ICD-10-CM | POA: Diagnosis not present

## 2017-06-06 DIAGNOSIS — Z9842 Cataract extraction status, left eye: Secondary | ICD-10-CM | POA: Insufficient documentation

## 2017-06-06 DIAGNOSIS — Z955 Presence of coronary angioplasty implant and graft: Secondary | ICD-10-CM | POA: Insufficient documentation

## 2017-06-06 DIAGNOSIS — I10 Essential (primary) hypertension: Secondary | ICD-10-CM | POA: Diagnosis not present

## 2017-06-06 DIAGNOSIS — I739 Peripheral vascular disease, unspecified: Secondary | ICD-10-CM | POA: Diagnosis not present

## 2017-06-06 MED ORDER — METOPROLOL SUCCINATE ER 25 MG PO TB24: 25.0000 mg | ORAL_TABLET | Freq: Every day | ORAL | 3 refills | Status: DC

## 2017-06-06 MED ORDER — LISINOPRIL 20 MG PO TABS: 20.0000 mg | ORAL_TABLET | Freq: Every day | ORAL | 3 refills | Status: DC

## 2017-06-06 MED ORDER — DIAZEPAM 2 MG PO TABS: 1.0000 mg | ORAL_TABLET | Freq: Two times a day (BID) | ORAL | 1 refills | Status: DC | PRN

## 2017-06-06 MED ORDER — CLOPIDOGREL BISULFATE 75 MG PO TABS: 75.0000 mg | ORAL_TABLET | Freq: Every day | ORAL | 3 refills | Status: DC

## 2017-06-06 MED ORDER — LEVOTHYROXINE SODIUM 75 MCG PO TABS: 75.0000 ug | ORAL_TABLET | Freq: Every day | ORAL | 3 refills | Status: DC

## 2017-06-06 NOTE — Patient Instructions (Signed)
Check your blood pressure 2-3 times a week.  Goal is 130/80 or lower.   Try eating a green leafy veggie daily or drinking prune juice a few times a week to help decrease constipation.  Use the Valium as needed for vertigo.  Take 1/32 tab as needed.

## 2017-06-06 NOTE — Progress Notes (Signed)
Patient ID: [name], female    DOB: 1940-09-10  MRN: [id number]  CC: New Patient (Initial Visit) and Constipation   Subjective: [name] is a 77 y.o. female who presents for new patient visit.  Daughter, [name], is with her. Prior physician was [employer] [facility] at [place].  Dr. Jonni Sanger left the practice and she has had a hard time getting appointments.  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  She is independent in ADLs.  Denies any falls.  She walks unassisted.  Hypertension: Reports compliance with medications that include metoprolol and lisinopril. She has taken meds already for today. Checks her blood pressure several times a week.  Reports that blood pressures have been good.  She is surprised today after reading She limits salt in the foods. No chest pains or shortness of breath.  No lower extremity edema.  HL: She is on simvastatin.  Reports occasional cramps in the left leg at nights.  Vertigo: She is on diazepam which she takes as needed.  She denies having to take it every day.  She does not tolerate meclizine  History of macular degeneration.  Followed by ophthalmologist Dr. Schuyler Amor.  Vit D 600/800 and Vit D 2000.  Health maintenance: She has had colonoscopy before.  She declines flu shot.  She has a bottle of vitamin D 2000 IUs and a bottle of calcium/vitamin D 600/800 IU which he takes daily Patient Active Problem List   Diagnosis Date Noted  . Carotid stenosis 01/22/2014  . Aftercare following surgery of the circulatory system, [place] 01/02/2013  . Occlusion and stenosis of carotid artery without mention of cerebral infarction 04/27/2011     Current Outpatient Medications on File Prior to Visit  Medication Sig Dispense Refill  . aspirin 81  MG chewable tablet Chew 81 mg by mouth daily.    . calcium-vitamin D (OSCAL WITH D) 500-200 MG-UNIT per tablet Take 1 tablet by mouth daily with breakfast.    . cetirizine (ZYRTEC) 10 MG tablet Take 10 mg daily as needed by mouth for allergies.     . Multiple Vitamins-Minerals (ALIVE WOMENS ENERGY) TABS Take 1 tablet by mouth daily.    . Probiotic Product (PROBIOTIC PO) Take 1 capsule by mouth daily.    . simvastatin (ZOCOR) 20 MG tablet Take 20 mg at bedtime by mouth.     Marland Kitchen acetaminophen (TYLENOL) 325 MG tablet Take 650 mg every 6 (six) hours as needed by mouth for mild pain or headache.     . latanoprost (XALATAN) 0.005 % ophthalmic solution Place 1 drop at bedtime into both eyes.     No current facility-administered medications on file prior to visit.     Allergies  Allergen Reactions  . Meclizine Nausea And Vomiting    Social History   Socioeconomic History  . Marital status: Single    Spouse name: Not on file  . Number of children: Not on file  . Years of education: Not on file  . Highest education level: Not on file  Social Needs  . Financial resource strain: Not on file  . Food insecurity - worry: Not on file  . Food insecurity - inability: Not on file  . Transportation needs - medical: Not on file  . Transportation needs - non-medical: Not on file  Occupational History  . Not on file  Tobacco Use  . Smoking status: Former Smoker    Packs/day: 0.30    Years: 5.00    Pack years: 1.50    Types: Cigarettes    Last attempt to quit: 04/12/1980    Years since quitting: 37.1  . Smokeless tobacco: Never Used  Substance and Sexual Activity  . Alcohol use: No    Alcohol/week: 0.0 oz  . Drug use: No  . Sexual activity: Not on file  Other Topics Concern  . Not on file  Social History Narrative  . Not on file    Family History  Problem Relation Age of Onset  . Heart failure Mother   . Cancer Father     Past Surgical History:  Procedure Laterality Date  . ABDOMINAL  HYSTERECTOMY    . AORTIC ARCH ANGIOGRAPHY N/A 03/01/2017   Procedure: AORTIC ARCH ANGIOGRAPHY;  Surgeon: Serafina [name], MD;  Location: [place] [facility];  Service: Cardiovascular;  Laterality: N/A;  . ARCH AORTOGRAM N/A 05/04/2011   Procedure: ARCH AORTOGRAM;  Surgeon: Serafina [name], MD;  Location: [facility] CATH LAB;  Service: Cardiovascular;  Laterality: N/A;  . ARCH AORTOGRAM N/A 07/04/2012   Procedure: ARCH AORTOGRAM;  Surgeon: Serafina [name], MD;  Location: [facility] CATH LAB;  Service: Cardiovascular;  Laterality: N/A;  . CAROTID ANGIOGRAM  July 04, 2012  . CAROTID ANGIOGRAM N/A 05/04/2011   Procedure: CAROTID ANGIOGRAM;  Surgeon: Serafina [name], MD;  Location: [facility] CATH LAB;  Service: Cardiovascular;  Laterality: N/A;  . CAROTID ANGIOGRAM N/A 07/04/2012   Procedure: CAROTID ANGIOGRAM;  Surgeon: Serafina [name], MD;  Location: [facility] CATH LAB;  Service: Cardiovascular;  Laterality: N/A;  . CAROTID STENT INSERTION Right 05/11/2011   Procedure: CAROTID STENT INSERTION;  Surgeon: Serafina [name], MD;  Location: [facility] CATH LAB;  Service: Cardiovascular;  Laterality: Right;  . carotid subclavian anastomosis  05/11/11   Right subclav. artery transected 12/17/09  . CATARACT EXTRACTION     bilateral  . EYE SURGERY      ROS: Review of Systems  Constitutional:       She walks daily for exercise.  She also uses 10 pounds weights for arm exercises.  Reports good appetite  Eyes: Positive for visual disturbance.  Respiratory: Negative for cough and chest tightness.   Cardiovascular: Negative for chest pain.  Gastrointestinal:       Moving bowels okay.  No blood in the stools.  Endocrine:       On levothyroxine for thyroid.  Reports compliance with medication.  Psychiatric/Behavioral: Negative for agitation. The patient is not nervous/anxious.     PHYSICAL EXAM: BP (!) 174/79   Pulse 73   Temp 97.9 F (36.6 C) (Oral)   Resp 16   Ht _0  (1.575 m)   Wt 142 lb 9.6 oz (64.7 kg)   SpO2 98%   BMI 26.08  kg/m   Peak blood pressure 144/68 Physical Exam  General appearance - alert, well appearing, elderly African-American female and in no distress Mental status - alert, oriented to person, place, and time, normal mood, behavior, speech, dress, motor activity, and thought processes Eyes -pink conjunctiva Mouth - mucous membranes moist, pharynx normal without lesions.  Has dentures above and below Neck - supple, no significant adenopathy Chest - clear to auscultation, no wheezes, rales or rhonchi, symmetric air entry Heart - normal rate, regular rhythm, normal S1, S2, no murmurs, rubs, clicks or gallops Extremities - peripheral  pulses normal, no pedal edema, no clubbing or cyanosis Dorsalis pedis, posterior tibialis and popliteal pulses 3+ bilaterally  ASSESSMENT AND PLAN: 1. Essential hypertension Repeat blood pressure today much better but still not at goal.  She reports good home blood pressure readings.  Advised to keep a log and bring in readings on next visit. - lisinopril (PRINIVIL,ZESTRIL) 20 MG tablet; Take 1 tablet (20 mg total) by mouth daily.  Dispense: 90 tablet; Refill: 3 - metoprolol succinate (TOPROL-XL) 25 MG 24 hr tablet; Take 1 tablet (25 mg total) by mouth daily.  Dispense: 90 tablet; Refill: 3 - CBC - Comprehensive metabolic panel - Lipid panel  2. Hyperlipidemia, unspecified hyperlipidemia type Continue simvastatin.  3. Leg cramps Not very bothersome and occurs infrequently.  We agreed to continue simvastatin at the current dose  4. Influenza vaccination declined   5. Vertigo -Advised patient to take Valium only as needed.  I told her that the medication can cause drowsiness and can increase risk of falls in elderly people.  I have cut the dose to 2 mg half a tablet as needed -Controlled substance agreement reviewed and signed.  [place] controlled substance reporting system reviewed and is appropriate - diazepam (VALIUM) 2 MG tablet; Take 0.5 tablets (1 mg  total) by mouth every 12 (twelve) hours as needed (vertigo).  Dispense: 15 tablet; Refill: 1  6. Macular degeneration, unspecified laterality, unspecified type Followed by Dr. Schuyler Amor  7. Hypothyroidism, unspecified type - levothyroxine (SYNTHROID, LEVOTHROID) 75 MCG tablet; Take 1 tablet (75 mcg total) by mouth daily before breakfast.  Dispense: 90 tablet; Refill: 3 - TSH  8. Vitamin D deficiency Advised patient to stop the 2000 IUs.  She can continue the calcium/vitamin D 600 mg / 800 IU daily - VITAMIN D 25 Hydroxy (Vit-D Deficiency, Fractures)  Patient was given the opportunity to ask questions.  Patient verbalized understanding of the plan and was able to repeat key elements of the plan.   Orders Placed This Encounter  Procedures  . CBC  . Comprehensive metabolic panel  . Lipid panel  . TSH  . VITAMIN D 25 Hydroxy (Vit-D Deficiency, Fractures)     Requested Prescriptions   Signed Prescriptions Disp Refills  . lisinopril (PRINIVIL,ZESTRIL) 20 MG tablet 90 tablet 3    Sig: Take 1 tablet (20 mg total) by mouth daily.  . metoprolol succinate (TOPROL-XL) 25 MG 24 hr tablet 90 tablet 3    Sig: Take 1 tablet (25 mg total) by mouth daily.  . clopidogrel (PLAVIX) 75 MG tablet 90 tablet 3    Sig: Take 1 tablet (75 mg total) by mouth daily.  Marland Kitchen levothyroxine (SYNTHROID, LEVOTHROID) 75 MCG tablet 90 tablet 3    Sig: Take 1 tablet (75 mcg total) by mouth daily before breakfast.  . diazepam (VALIUM) 2 MG tablet 15 tablet 1    Sig: Take 0.5 tablets (1 mg total) by mouth every 12 (twelve) hours as needed (vertigo).    Return in about 2 months (around 08/04/2017).  Karle Plumber, MD, FACP

## 2017-06-07 LAB — COMPREHENSIVE METABOLIC PANEL
ALK PHOS: 81 IU/L (ref 39–117)
ALT: 25 IU/L (ref 0–32)
AST: 47 IU/L — AB (ref 0–40)
Albumin/Globulin Ratio: 1.2 (ref 1.2–2.2)
Albumin: 4.5 g/dL (ref 3.5–4.8)
BUN/Creatinine Ratio: 10 — ABNORMAL LOW (ref 12–28)
BUN: 8 mg/dL (ref 8–27)
Bilirubin Total: 0.4 mg/dL (ref 0.0–1.2)
CALCIUM: 9.8 mg/dL (ref 8.7–10.3)
CO2: 21 mmol/L (ref 20–29)
CREATININE: 0.83 mg/dL (ref 0.57–1.00)
Chloride: 107 mmol/L — ABNORMAL HIGH (ref 96–106)
GFR calc Af Amer: 78 mL/min/{1.73_m2} (ref >59)
GFR, EST NON AFRICAN AMERICAN: 68 mL/min/{1.73_m2} (ref >59)
GLUCOSE: 90 mg/dL (ref 65–99)
Globulin, Total: 3.8 g/dL (ref 1.5–4.5)
Potassium: 4.1 mmol/L (ref 3.5–5.2)
Sodium: 143 mmol/L (ref 134–144)
Total Protein: 8.3 g/dL (ref 6.0–8.5)

## 2017-06-07 LAB — LIPID PANEL
CHOL/HDL RATIO: 2.5 ratio (ref 0.0–4.4)
CHOLESTEROL TOTAL: 158 mg/dL (ref 100–199)
HDL: 62 mg/dL (ref >39)
LDL CALC: 81 mg/dL (ref 0–99)
TRIGLYCERIDES: 77 mg/dL (ref 0–149)
VLDL CHOLESTEROL CAL: 15 mg/dL (ref 5–40)

## 2017-06-07 LAB — TSH: TSH: 1.28 u[IU]/mL (ref 0.450–4.500)

## 2017-06-07 LAB — VITAMIN D 25 HYDROXY (VIT D DEFICIENCY, FRACTURES): Vit D, 25-Hydroxy: 47.9 ng/mL (ref 30.0–100.0)

## 2017-06-07 LAB — CBC
Hematocrit: 38.1 % (ref 34.0–46.6)
Hemoglobin: 12.4 g/dL (ref 11.1–15.9)
MCH: 30.4 pg (ref 26.6–33.0)
MCHC: 32.5 g/dL (ref 31.5–35.7)
MCV: 93 fL (ref 79–97)
Platelets: 251 10*3/uL (ref 150–379)
RBC: 4.08 x10E6/uL (ref 3.77–5.28)
RDW: 14.8 % (ref 12.3–15.4)
WBC: 8 10*3/uL (ref 3.4–10.8)

## 2017-06-10 ENCOUNTER — Telehealth: Payer: Self-pay

## 2017-06-10 NOTE — Telephone Encounter (Signed)
Contacted pt to go over lab results pt is aware and doesn't have any questions or concerns 

## 2017-07-12 DIAGNOSIS — Z961 Presence of intraocular lens: Secondary | ICD-10-CM | POA: Diagnosis not present

## 2017-07-12 DIAGNOSIS — H401131 Primary open-angle glaucoma, bilateral, mild stage: Secondary | ICD-10-CM | POA: Diagnosis not present

## 2017-08-05 ENCOUNTER — Encounter: Payer: Self-pay | Admitting: Internal Medicine

## 2017-08-05 ENCOUNTER — Telehealth: Payer: Self-pay | Admitting: Internal Medicine

## 2017-08-05 ENCOUNTER — Ambulatory Visit: Payer: Medicare Other | Attending: Internal Medicine | Admitting: Internal Medicine

## 2017-08-05 VITALS — BP 170/72 | HR 75 | Temp 98.1°F | Resp 16 | Wt 139.0 lb

## 2017-08-05 DIAGNOSIS — I10 Essential (primary) hypertension: Secondary | ICD-10-CM | POA: Insufficient documentation

## 2017-08-05 DIAGNOSIS — Z87891 Personal history of nicotine dependence: Secondary | ICD-10-CM | POA: Diagnosis not present

## 2017-08-05 DIAGNOSIS — Z79899 Other long term (current) drug therapy: Secondary | ICD-10-CM | POA: Diagnosis not present

## 2017-08-05 DIAGNOSIS — Z7901 Long term (current) use of anticoagulants: Secondary | ICD-10-CM | POA: Diagnosis not present

## 2017-08-05 DIAGNOSIS — E785 Hyperlipidemia, unspecified: Secondary | ICD-10-CM | POA: Insufficient documentation

## 2017-08-05 DIAGNOSIS — J301 Allergic rhinitis due to pollen: Secondary | ICD-10-CM

## 2017-08-05 DIAGNOSIS — I6529 Occlusion and stenosis of unspecified carotid artery: Secondary | ICD-10-CM | POA: Diagnosis not present

## 2017-08-05 DIAGNOSIS — R42 Dizziness and giddiness: Secondary | ICD-10-CM | POA: Insufficient documentation

## 2017-08-05 DIAGNOSIS — Z90710 Acquired absence of both cervix and uterus: Secondary | ICD-10-CM | POA: Insufficient documentation

## 2017-08-05 DIAGNOSIS — K5900 Constipation, unspecified: Secondary | ICD-10-CM | POA: Diagnosis not present

## 2017-08-05 DIAGNOSIS — Z7982 Long term (current) use of aspirin: Secondary | ICD-10-CM | POA: Diagnosis not present

## 2017-08-05 DIAGNOSIS — J302 Other seasonal allergic rhinitis: Secondary | ICD-10-CM | POA: Insufficient documentation

## 2017-08-05 DIAGNOSIS — Z8249 Family history of ischemic heart disease and other diseases of the circulatory system: Secondary | ICD-10-CM | POA: Diagnosis not present

## 2017-08-05 DIAGNOSIS — E039 Hypothyroidism, unspecified: Secondary | ICD-10-CM | POA: Diagnosis not present

## 2017-08-05 DIAGNOSIS — Z95828 Presence of other vascular implants and grafts: Secondary | ICD-10-CM | POA: Insufficient documentation

## 2017-08-05 DIAGNOSIS — H3530 Unspecified macular degeneration: Secondary | ICD-10-CM | POA: Insufficient documentation

## 2017-08-05 DIAGNOSIS — Z79890 Hormone replacement therapy (postmenopausal): Secondary | ICD-10-CM | POA: Diagnosis not present

## 2017-08-05 DIAGNOSIS — Z9889 Other specified postprocedural states: Secondary | ICD-10-CM | POA: Diagnosis not present

## 2017-08-05 DIAGNOSIS — G500 Trigeminal neuralgia: Secondary | ICD-10-CM | POA: Insufficient documentation

## 2017-08-05 DIAGNOSIS — Z9841 Cataract extraction status, right eye: Secondary | ICD-10-CM | POA: Insufficient documentation

## 2017-08-05 DIAGNOSIS — Z888 Allergy status to other drugs, medicaments and biological substances status: Secondary | ICD-10-CM | POA: Diagnosis not present

## 2017-08-05 DIAGNOSIS — Z9842 Cataract extraction status, left eye: Secondary | ICD-10-CM | POA: Diagnosis not present

## 2017-08-05 MED ORDER — DICLOFENAC SODIUM 1 % TD GEL: 2.0000 g | Freq: Four times a day (QID) | TRANSDERMAL | 0 refills | Status: DC

## 2017-08-05 MED ORDER — DOCUSATE SODIUM 100 MG PO CAPS: 100.0000 mg | ORAL_CAPSULE | Freq: Every day | ORAL | 2 refills | Status: DC | PRN

## 2017-08-05 NOTE — Assessment & Plan Note (Signed)
On Valium PRN Controlled substance prescribing agreement 05/2017

## 2017-08-05 NOTE — Progress Notes (Signed)
Patient ID: [name], female    DOB: February 20, 1941  MRN: [id number]  CC: Hypertension   Subjective: [name] is a 77 y.o. female who presents for chronic ds management.  Daughter, [name], is with her.  Last seen 2 months ago. 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  Patient's main complaint today is discomfort around the right ear x2 to 3 days.  She describes it as a hot burning sensation.  Hurts to chew and sometimes talk.  Also aggravated by cold air.  Pain is intermittent occurring about once to twice a day and lasting 2 to 3 minutes.  No drainage from the ear or decreased hearing.  She uses a hair pin to clean her ears.   -Reports sneezing and itchy eyes.  Uses Zyrtec.  Steroid nasal sprays in the past caused nosebleeds.   -Reports swelling over both cheeks in the mornings for the past 1 year  2.  HTN:  Checks daily and has logbook for my review. Range 100-120s/50-60s Limits salt intake No CP/SOB  3.  Vertigo: Reports only one episode since last visit for which she took half a pill of Valium.  She has not filled the prescription that I gave on last visit as yet  Patient Active Problem List   Diagnosis Date Noted  . Essential hypertension 06/06/2017  . Hyperlipidemia 06/06/2017  . Leg cramps 06/06/2017  . Vertigo 06/06/2017  . Macular degeneration 06/06/2017  . Hypothyroidism 06/06/2017  . Carotid stenosis 01/22/2014  . Aftercare following surgery of the circulatory system, [place] 01/02/2013  . Occlusion and stenosis of carotid artery without mention of cerebral infarction 04/27/2011     Current Outpatient Medications on File Prior to Visit  Medication Sig Dispense Refill  . acetaminophen (TYLENOL) 325 MG tablet Take 650 mg every 6 (six) hours as needed by mouth for mild pain or  headache.     Marland Kitchen aspirin 81 MG chewable tablet Chew 81 mg by mouth daily.    . calcium-vitamin D (OSCAL WITH D) 500-200 MG-UNIT per tablet Take 1 tablet by mouth daily with breakfast.    . cetirizine (ZYRTEC) 10 MG tablet Take 10 mg daily as needed by mouth for allergies.     Marland Kitchen clopidogrel (PLAVIX) 75 MG tablet Take 1 tablet (75 mg total) by mouth daily. 90 tablet 3  . diazepam (VALIUM) 2 MG tablet Take 0.5 tablets (1 mg total) by mouth every 12 (twelve) hours as needed (vertigo). 15 tablet 1  . latanoprost (XALATAN) 0.005 % ophthalmic solution Place 1 drop at bedtime into both eyes.    Marland Kitchen levothyroxine (SYNTHROID, LEVOTHROID) 75 MCG tablet Take 1 tablet (75 mcg total) by mouth daily before breakfast. 90 tablet 3  . lisinopril (PRINIVIL,ZESTRIL) 20 MG tablet Take 1 tablet (20 mg total) by mouth daily. 90 tablet 3  . metoprolol succinate (TOPROL-XL) 25 MG 24 hr tablet Take 1 tablet (25 mg total) by mouth daily. 90 tablet 3  . Multiple Vitamins-Minerals (ALIVE WOMENS ENERGY) TABS Take 1 tablet by mouth daily.    . Probiotic Product (PROBIOTIC PO) Take 1 capsule by mouth daily.    . simvastatin (ZOCOR) 20 MG tablet Take 20 mg at bedtime by mouth.      No current facility-administered medications on file prior to visit.     Allergies  Allergen  Reactions  . Meclizine Nausea And Vomiting    Social History   Socioeconomic History  . Marital status: Single    Spouse name: Not on file  . Number of children: Not on file  . Years of education: Not on file  . Highest education level: Not on file  Occupational History  . Not on file  Social Needs  . Financial resource strain: Not on file  . Food insecurity:    Worry: Not on file    Inability: Not on file  . Transportation needs:    Medical: Not on file    Non-medical: Not on file  Tobacco Use  . Smoking status: Former Smoker    Packs/day: 0.30    Years: 5.00    Pack years: 1.50    Types: Cigarettes    Last attempt to quit: 04/12/1980     Years since quitting: 37.3  . Smokeless tobacco: Never Used  Substance and Sexual Activity  . Alcohol use: No    Alcohol/week: 0.0 oz  . Drug use: No  . Sexual activity: Not on file  Lifestyle  . Physical activity:    Days per week: Not on file    Minutes per session: Not on file  . Stress: Not on file  Relationships  . Social connections:    Talks on phone: Not on file    Gets together: Not on file    Attends religious service: Not on file    Active member of club or organization: Not on file    Attends meetings of clubs or organizations: Not on file    Relationship status: Not on file  . Intimate partner violence:    Fear of current or ex partner: Not on file    Emotionally abused: Not on file    Physically abused: Not on file    Forced sexual activity: Not on file  Other Topics Concern  . Not on file  Social History Narrative  . Not on file    Family History  Problem Relation Age of Onset  . Heart failure Mother   . Cancer Father     Past Surgical History:  Procedure Laterality Date  . ABDOMINAL HYSTERECTOMY    . AORTIC ARCH ANGIOGRAPHY N/A 03/01/2017   Procedure: AORTIC ARCH ANGIOGRAPHY;  Surgeon: Serafina [name], MD;  Location: [place] [facility];  Service: Cardiovascular;  Laterality: N/A;  . ARCH AORTOGRAM N/A 05/04/2011   Procedure: ARCH AORTOGRAM;  Surgeon: Serafina [name], MD;  Location: [facility] CATH LAB;  Service: Cardiovascular;  Laterality: N/A;  . ARCH AORTOGRAM N/A 07/04/2012   Procedure: ARCH AORTOGRAM;  Surgeon: Serafina [name], MD;  Location: [facility] CATH LAB;  Service: Cardiovascular;  Laterality: N/A;  . CAROTID ANGIOGRAM  July 04, 2012  . CAROTID ANGIOGRAM N/A 05/04/2011   Procedure: CAROTID ANGIOGRAM;  Surgeon: Serafina [name], MD;  Location: [facility] CATH LAB;  Service: Cardiovascular;  Laterality: N/A;  . CAROTID ANGIOGRAM N/A 07/04/2012   Procedure: CAROTID ANGIOGRAM;  Surgeon: Serafina [name], MD;  Location[facility] CATH LAB;  Service: Cardiovascular;  Laterality:  N/A;  . CAROTID STENT INSERTION Right 05/11/2011   Procedure: CAROTID STENT INSERTION;  Surgeon: Serafina [name], MD;  Location: [facility] CATH LAB;  Service: Cardiovascular;  Laterality: Right;  . carotid subclavian anastomosis  05/11/11   Right subclav. artery transected 12/17/09  . CATARACT EXTRACTION     bilateral  . EYE SURGERY      ROS: Review of Systems Negative except  as stated above PHYSICAL EXAM: BP (!) 170/72   Pulse 75   Temp 98.1 F (36.7 C) (Oral)   Resp 16   Wt 139 lb (63 kg)   SpO2 96%   BMI 25.42 kg/m   Wt Readings from Last 3 Encounters:  08/05/17 139 lb (63 kg)  06/06/17 142 lb 9.6 oz (64.7 kg)  03/01/17 142 lb (64.4 kg)   Repeat blood pressure BP 166/72 Physical Exam  General appearance - alert, well appearing, and in no distress Mental status - alert, oriented to person, place, and time, normal mood, behavior, speech, dress, motor activity, and thought processes Ears - bilateral TM's and external ear canals normal Nose - normal and patent, no erythema, discharge or polyps Mouth - mucous membranes moist, pharynx normal without lesions.  She has dentures above and below.  No pain on movement of TMJ Neck - supple, no significant adenopathy Chest - clear to auscultation, no wheezes, rales or rhonchi, symmetric air entry Heart - normal rate, regular rhythm, normal S1, S2, no murmurs, rubs, clicks or gallops  Results for orders placed or performed in visit on 06/06/17  CBC  Result Value Ref Range   WBC 8.0 3.4 - 10.8 x10E3/uL   RBC 4.08 3.77 - 5.28 x10E6/uL   Hemoglobin 12.4 11.1 - 15.9 g/dL   Hematocrit 38.1 34.0 - 46.6 %   MCV 93 79 - 97 fL   MCH 30.4 26.6 - 33.0 pg   MCHC 32.5 31.5 - 35.7 g/dL   RDW 14.8 12.3 - 15.4 %   Platelets 251 150 - 379 x10E3/uL  Comprehensive metabolic panel  Result Value Ref Range   Glucose 90 65 - 99 mg/dL   BUN 8 8 - 27 mg/dL   Creatinine, Ser 0.83 0.57 - 1.00 mg/dL   GFR calc non Af Amer 68 >59 mL/min/1.73   GFR calc Af  Amer 78 >59 mL/min/1.73   BUN/Creatinine Ratio 10 (L) 12 - 28   Sodium 143 134 - 144 mmol/L   Potassium 4.1 3.5 - 5.2 mmol/L   Chloride 107 (H) 96 - 106 mmol/L   CO2 21 20 - 29 mmol/L   Calcium 9.8 8.7 - 10.3 mg/dL   Total Protein 8.3 6.0 - 8.5 g/dL   Albumin 4.5 3.5 - 4.8 g/dL   Globulin, Total 3.8 1.5 - 4.5 g/dL   Albumin/Globulin Ratio 1.2 1.2 - 2.2   Bilirubin Total 0.4 0.0 - 1.2 mg/dL   Alkaline Phosphatase 81 39 - 117 IU/L   AST 47 (H) 0 - 40 IU/L   ALT 25 0 - 32 IU/L  Lipid panel  Result Value Ref Range   Cholesterol, Total 158 100 - 199 mg/dL   Triglycerides 77 0 - 149 mg/dL   HDL 62 >39 mg/dL   VLDL Cholesterol Cal 15 5 - 40 mg/dL   LDL Calculated 81 0 - 99 mg/dL   Chol/HDL Ratio 2.5 0.0 - 4.4 ratio  TSH  Result Value Ref Range   TSH 1.280 0.450 - 4.500 uIU/mL  VITAMIN D 25 Hydroxy (Vit-D Deficiency, Fractures)  Result Value Ref Range   Vit D, 25-Hydroxy 47.9 30.0 - 100.0 ng/mL    ASSESSMENT AND PLAN: 1. Essential hypertension Blood pressure elevated in the office but home blood pressure readings with automated upper arm cuff are good.  We will have her continue metoprolol and lisinopril  2. Trigeminal neuralgia -Probable diagnosis here.  I went over diagnoses and symptoms with patient and her daughter.  Discussed management with low-dose of Tegretol but they wish to hold off for now due to possible side effects.  Patient wanted to know if there was some pain rub or gel that she can use over the area and I did prescribe some Voltaren gel for her to use as needed to see if any improvement  3. Constipation, unspecified constipation type Patient had a bottle of docusate and requested refills.  She uses it as needed for constipation - docusate sodium (COLACE) 100 MG capsule; Take 1 capsule (100 mg total) by mouth daily as needed for mild constipation.  Dispense: 30 capsule; Refill: 2  4. Seasonal allergic rhinitis due to pollen Continue Zyrtec as needed  5.  Vertigo Intermittent.  She uses Valium sparingly.   Patient was given the opportunity to ask questions.  Patient verbalized understanding of the plan and was able to repeat key elements of the plan.   No orders of the defined types were placed in this encounter.    Requested Prescriptions    No prescriptions requested or ordered in this encounter    No follow-ups on file.  Karle Plumber, MD, FACP

## 2017-08-05 NOTE — Patient Instructions (Signed)
Use Claritin or Zyrtec for your allergy symptoms.  Try the Voltaren gel for the trigeminal neuralgia symtoms Trigeminal Neuralgia Trigeminal neuralgia is a nerve disorder that causes attacks of severe facial pain. The attacks last from a few seconds to several minutes. They can happen for days, weeks, or months and then go away for months or years. Trigeminal neuralgia is also called tic douloureux. What are the causes? This condition is caused by damage to a nerve in the face that is called the trigeminal nerve. An attack can be triggered by:  Talking.  Chewing.  Putting on makeup.  Washing your face.  Shaving your face.  Brushing your teeth.  Touching your face.  What increases the risk? This condition is more likely to develop in:  Women.  People who are 34 years of age or older.  What are the signs or symptoms? The main symptom of this condition is pain in the jaw, lips, eyes, nose, scalp, forehead, and face. The pain may be intense, stabbing, electric, or shock-like. How is this diagnosed? This condition is diagnosed with a physical exam. A CT scan or MRI may be done to rule out other conditions that can cause facial pain. How is this treated? This condition may be treated with:  Avoiding the things that trigger your attacks.  Pain medicine.  Surgery. This may be done in severe cases if other medical treatment does not provide relief.  Follow these instructions at home:  Take over-the-counter and prescription medicines only as told by your health care provider.  If you wish to get pregnant, talk with your health care provider before you start trying to get pregnant.  Avoid the things that trigger your attacks. It may help to: ? Chew on the unaffected side of your mouth. ? Avoid touching your face. ? Avoid blasts of hot or cold air. Contact a health care provider if:  Your pain medicine is not helping.  You develop new, unexplained symptoms, such  as: ? Double vision. ? Facial weakness. ? Changes in hearing or balance.  You become pregnant. Get help right away if:  Your pain is unbearable, and your pain medicine does not help. This information is not intended to replace advice given to you by your health care provider. Make sure you discuss any questions you have with your health care provider. Document Released: 03/26/2000 Document Revised: 11/30/2015 Document Reviewed: 07/22/2014 Elsevier Interactive Patient Education  [employer].

## 2017-08-05 NOTE — Progress Notes (Signed)
Pt states her jaw is swollen  Pt states ever once in a while she has pain in her jaw  Pt states she feels like something is in her ear and the pain shoots down to her jaw

## 2017-08-05 NOTE — Telephone Encounter (Signed)
12 page, paperwork received through fax 08-05-17.

## 2017-08-10 ENCOUNTER — Telehealth: Payer: Self-pay | Admitting: Internal Medicine

## 2017-08-11 ENCOUNTER — Telehealth: Payer: Self-pay | Admitting: Internal Medicine

## 2017-08-11 NOTE — Telephone Encounter (Signed)
Contacted pt and informed her that her insurance denied to pay for the Voltaren Gel and hse can use tylenol prn. Pt states she understands and doesn't have any questions or concerns

## 2017-08-11 NOTE — Telephone Encounter (Signed)
Will forward to pcp

## 2017-08-11 NOTE — Telephone Encounter (Signed)
Patient called and was informed of most recent note from pcp regarding denied facial gel. Patient stated could she take the pill like form of the gel that was dicussed at most recent office visit. Patient stated tylenol doesn't really help. Please fu at your earliest convenience. Please send to [facility] on [email or web address] if able to do so.

## 2017-08-12 NOTE — Telephone Encounter (Signed)
PC placed to pt this a.m.  Left message identify myself.  I would like to speak with pt or daughter Arbie Cookey about other treatment options for the Trigeminal neuralgia.

## 2017-08-22 ENCOUNTER — Ambulatory Visit (INDEPENDENT_AMBULATORY_CARE_PROVIDER_SITE_OTHER): Payer: Medicare Other | Admitting: Family

## 2017-08-22 ENCOUNTER — Encounter: Payer: Self-pay | Admitting: Family

## 2017-08-22 ENCOUNTER — Ambulatory Visit (HOSPITAL_COMMUNITY)
Admission: RE | Admit: 2017-08-22 | Discharge: 2017-08-22 | Disposition: A | Discharge status: Home / Self Care | Payer: Medicare Other | Source: Ambulatory Visit | Attending: Family | Admitting: Family

## 2017-08-22 VITALS — BP 148/64 | HR 66 | Temp 97.2°F | Resp 16 | Ht 62.0 in | Wt 135.0 lb

## 2017-08-22 DIAGNOSIS — I708 Atherosclerosis of other arteries: Secondary | ICD-10-CM | POA: Diagnosis not present

## 2017-08-22 DIAGNOSIS — Z959 Presence of cardiac and vascular implant and graft, unspecified: Secondary | ICD-10-CM

## 2017-08-22 DIAGNOSIS — E785 Hyperlipidemia, unspecified: Secondary | ICD-10-CM | POA: Diagnosis not present

## 2017-08-22 DIAGNOSIS — I6523 Occlusion and stenosis of bilateral carotid arteries: Secondary | ICD-10-CM | POA: Insufficient documentation

## 2017-08-22 DIAGNOSIS — I10 Essential (primary) hypertension: Secondary | ICD-10-CM | POA: Diagnosis not present

## 2017-08-22 NOTE — Patient Instructions (Signed)
Stroke Prevention Some health problems and behaviors may make it more likely for you to have a stroke. Below are ways to lessen your risk of having a stroke.  Be active for at least 30 minutes on most or all days.  Do not smoke. Try not to be around others who smoke.  Do not drink too much alcohol. ? Do not have more than 2 drinks a day if you are a man. ? Do not have more than 1 drink a day if you are a woman and are not pregnant.  Eat healthy foods, such as fruits and vegetables. If you were put on a specific diet, follow the diet as told.  Keep your cholesterol levels under control through diet and medicines. Look for foods that are low in saturated fat, trans fat, cholesterol, and are high in fiber.  If you have diabetes, follow all diet plans and take your medicine as told.  Ask your doctor if you need treatment to lower your blood pressure. If you have high blood pressure (hypertension), follow all diet plans and take your medicine as told by your doctor.  If you are 88-10 years old, have your blood pressure checked every 3-5 years. If you are age 53 or older, have your blood pressure checked every year.  Keep a healthy weight. Eat foods that are low in calories, salt, saturated fat, trans fat, and cholesterol.  Do not take drugs.  Avoid birth control pills, if this applies. Talk to your doctor about the risks of taking birth control pills.  Talk to your doctor if you have sleep problems (sleep apnea).  Take all medicine as told by your doctor. ? You may be told to take aspirin or blood thinner medicine. Take this medicine as told by your doctor. ? Understand your medicine instructions.  Make sure any other conditions you have are being taken care of.  Get help right away if:  You suddenly lose feeling (you feel numb) or have weakness in your face, arm, or leg.  Your face or eyelid hangs down to one side.  You suddenly feel confused.  You have trouble talking  (aphasia) or understanding what people are saying.  You suddenly have trouble seeing in one or both eyes.  You suddenly have trouble walking.  You are dizzy.  You lose your balance or your movements are clumsy (uncoordinated).  You suddenly have a very bad headache and you do not know the cause.  You have new chest pain.  Your heart feels like it is fluttering or skipping a beat (irregular heartbeat). Do not wait to see if the symptoms above go away. Get help right away. Call your local emergency services (911 in [place] Do not drive yourself to the hospital. This information is not intended to replace advice given to you by your health care provider. Make sure you discuss any questions you have with your health care provider. Document Released: 09/28/2011 Document Revised: 09/04/2015 Document Reviewed: 09/29/2012 Elsevier Interactive Patient Education  [employer].

## 2017-08-22 NOTE — Progress Notes (Signed)
Chief Complaint: Follow up Extracranial Carotid Artery Stenosis   History of Present Illness  [name] is a 77 y.o. female who is s/p right carotid to subclavian anastomosis on 12/17/2009 by Dr. 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 She has no arm steal symptoms since this procedure. 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 Trula Slade 05/11/2011.  The patient denies any history of TIA or stroke symptoms. Specifically she denies a history of amaurosis fugax or monocular blindness, unilateral facial drooping, hemiplegia, or receptive or expressive aphasia.   She walks 2+miles daily, she denies claudication symptoms with walking, denies non healing wounds.   Pt states her blood pressure at home is about 254-270 systolic.   Pt was last evaluated by S. Rhyne and Dr. Trula Slade on 08-09-16. At that time her carotid duplex was essentially unchanged from the previous exam. She had elevated velocities at the carotid-subclavian transposition anastomosis measuring 518cm/s. (477cm/s last visit). Dr. Trula Slade reviewed the images and we will see her back in 6 months with repeat duplex. Symptoms of stroke reviewed and she knows to proceed to the ER if she develops any of these. Upon physical exam that day, her aorta was palpable. She did have CT scan of the abdomen in June 2017, which revealed a normal aorta. Dr. Trula Slade also examined pt and given her aorta was normal less than a year ago and she was asymptomatic, we will get a AAA ultrasound in 6 months when she returns. Discussed with her and her family member should she develop abdominal or back pain, she should go to the ER immediately.     Pt Diabetic: No Pt smoker: former smoker, quit about 1982  Pt meds include:  Statin :yes Betablocker: Yes  ASA: Yes  Other anticoagulants/antiplatelets:  Plavix   Past Medical History:  Diagnosis Date  . Allergy   . Arthritis   . Carotid artery occlusion   . GERD (gastroesophageal reflux disease)   . Hypertension   . Joint pain   . Thyroid disease     Social History Social History   Tobacco Use  . Smoking status: Former Smoker    Packs/day: 0.30    Years: 5.00    Pack years: 1.50    Types: Cigarettes    Last attempt to quit: 04/12/1980    Years since quitting: 37.3  . Smokeless tobacco: Never Used  Substance Use Topics  . Alcohol use: No    Alcohol/week: 0.0 oz  . Drug use: No    Family History Family History  Problem Relation Age of Onset  . Heart failure Mother   . Cancer Father     Surgical History Past Surgical History:  Procedure Laterality Date  . ABDOMINAL HYSTERECTOMY    . AORTIC ARCH ANGIOGRAPHY N/A 03/01/2017   Procedure: AORTIC ARCH ANGIOGRAPHY;  Surgeon: Serafina [name], MD;  Location: [facility];  Service: Cardiovascular;  Laterality: N/A;  . ARCH AORTOGRAM N/A 05/04/2011   Procedure: ARCH AORTOGRAM;  Surgeon: Serafina [name], MD;  Location: [facility] CATH LAB;  Service: Cardiovascular;  Laterality: N/A;  . ARCH AORTOGRAM N/A 07/04/2012   Procedure: ARCH AORTOGRAM;  Surgeon: Serafina [name], MD;  Location[facility] CATH LAB;  Service: Cardiovascular;  Laterality: N/A;  . CAROTID ANGIOGRAM  July 04, 2012  . CAROTID ANGIOGRAM N/A 05/04/2011   Procedure: CAROTID ANGIOGRAM;  Surgeon: Serafina [name], MD;  Location: [facility] CATH LAB;  Service: Cardiovascular;  Laterality: N/A;  . CAROTID ANGIOGRAM N/A 07/04/2012   Procedure: CAROTID ANGIOGRAM;  Surgeon: Serafina [name], MD;  Location: [facility] CATH LAB;  Service: Cardiovascular;  Laterality: N/A;  . CAROTID STENT INSERTION Right 05/11/2011   Procedure: CAROTID STENT INSERTION;  Surgeon: Serafina [name], MD;  Location: [facility] CATH LAB;  Service: Cardiovascular;  Laterality: Right;  . carotid subclavian anastomosis  05/11/11   Right subclav. artery transected 12/17/09  . CATARACT  EXTRACTION     bilateral  . EYE SURGERY      Allergies  Allergen Reactions  . Meclizine Nausea And Vomiting    Current Outpatient Medications  Medication Sig Dispense Refill  . acetaminophen (TYLENOL) 325 MG tablet Take 650 mg every 6 (six) hours as needed by mouth for mild pain or headache.     Marland Kitchen aspirin 81 MG chewable tablet Chew 81 mg by mouth daily.    . calcium-vitamin D (OSCAL WITH D) 500-200 MG-UNIT per tablet Take 1 tablet by mouth daily with breakfast.    . cetirizine (ZYRTEC) 10 MG tablet Take 10 mg daily as needed by mouth for allergies.     Marland Kitchen clopidogrel (PLAVIX) 75 MG tablet Take 1 tablet (75 mg total) by mouth daily. 90 tablet 3  . diazepam (VALIUM) 2 MG tablet Take 0.5 tablets (1 mg total) by mouth every 12 (twelve) hours as needed (vertigo). 15 tablet 1  . diclofenac sodium (VOLTAREN) 1 % GEL Apply 2 g topically 4 (four) times daily. 2 g 0  . docusate sodium (COLACE) 100 MG capsule Take 1 capsule (100 mg total) by mouth daily as needed for mild constipation. 30 capsule 2  . latanoprost (XALATAN) 0.005 % ophthalmic solution Place 1 drop at bedtime into both eyes.    Marland Kitchen levothyroxine (SYNTHROID, LEVOTHROID) 75 MCG tablet Take 1 tablet (75 mcg total) by mouth daily before breakfast. 90 tablet 3  . lisinopril (PRINIVIL,ZESTRIL) 20 MG tablet Take 1 tablet (20 mg total) by mouth daily. 90 tablet 3  . metoprolol succinate (TOPROL-XL) 25 MG 24 hr tablet Take 1 tablet (25 mg total) by mouth daily. 90 tablet 3  . Multiple Vitamins-Minerals (ALIVE WOMENS ENERGY) TABS Take 1 tablet by mouth daily.    . Probiotic Product (PROBIOTIC PO) Take 1 capsule by mouth daily.    . simvastatin (ZOCOR) 20 MG tablet Take 20 mg at bedtime by mouth.      No current facility-administered medications for this visit.     Review of Systems : See HPI for pertinent positives and negatives.  Physical Examination  Vitals:   08/22/17 1053  BP: (!) 148/64  Pulse: 66  Resp: 16  Temp: (!) 97.2 F  (36.2 C)  TempSrc: Oral  SpO2: 95%  Weight: 135 lb (61.2 kg)  Height: _0  (1.575 m)   Body mass index is 24.69 kg/m.  General: WDWN female in NAD  GAIT:normal  Eyes: PERRLA  Pulmonary:Respirations are non labored,CTAB,good air movement in all fields. Cardiac: regularrhythm, +murmur.   VASCULAR EXAM Carotid Bruits Left Right   Positive, soft  Positive, harsh    Radial pulses: right is weakly palpable, left is 2+ palpable.  Brachial pulses: 1+ palpable on right, left brachial pulse is 2+ palpable. Abdominal aortic pulse is palpable  LE Pulses  LEFT  RIGHT   POPLITEAL  not palpable  not palpable  POSTERIOR TIBIAL  not palpable  palpable   DORSALIS PEDIS ANTERIOR TIBIAL  palpable  palpable    Gastrointestinal: soft, nontender, BS WNL, no r/g, no masses palpated.  Musculoskeletal: Nomuscle atrophy/wasting. M/S 5/5 throughout, Extremities without ischemic changes.  Neurologic: A&O X 3; appropriate affect, sensation is normal, speech is normal, CN 2-12 intact except slightly hard of hearing, Pain and light touch intact in extremities, Motor examas listed above Skin: No rashes, no ulcers, no cellulitis.      Psychiatric: Normal thought content, mood appropriate to clinical situation.    Assessment: ANILA BOJARSKI is a 77 y.o. 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 She has no armstealsymptoms since this procedure. 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 Trula Slade 05/11/2011.  She has no history of stroke or TIA. She remains very active.   DATA  Carotid Duplex (08/22/17): 1-39% bilateral internal carotid artery stenosis. Velocity of 261 cm/s (was 485 cm/s) at the carotid/subclaian transposition anastomosis. Patent stent of the CCA with  velocity of 299 cm/s with heterozygous plaque. Bilateral vertebral artery flow is antegrade.  Bilateral subclavian artery waveforms are normal.     AAA Duplex (02/14/17): No evidence of abdominal aortic aneurysm, bilateral CIA diameters WNL.  No previous exam for comparison.   PLAN:   Based on today's exam and non-invasive vascular lab results, the patient will follow up in 9 months with the following tests: carotid duplex.  Since no aneurysmal dilation of abdominal aorta and common iliac arteries on 02-14-17 duplex, will not need to check AAA duplex again   I discussed in depth with the patient the nature of atherosclerosis, and emphasized the importance of maximal medical management including strict control of blood pressure, blood glucose, and lipid levels, obtaining regular exercise, and continued cessation of smoking.  The patient is aware that without maximal medical management the underlying atherosclerotic disease process will progress, limiting the benefit of any interventions. The patient was given information about stroke prevention and what symptoms should prompt the patient to seek immediate medical care. Thank you for allowing [place] to participate in this patient's care.  Clemon Chambers, RN, MSN, FNP-C [facility] Office: [facility] Physician: Trula Slade  08/22/17 8:08 PM

## 2017-08-22 NOTE — Progress Notes (Signed)
Pt. Stated she have a stent on her right arm. Unable to get blood pressure.

## 2017-09-07 ENCOUNTER — Other Ambulatory Visit: Payer: Self-pay

## 2017-09-07 MED ORDER — DICLOFENAC SODIUM 1 % TD GEL: 2.0000 g | Freq: Four times a day (QID) | TRANSDERMAL | 0 refills | Status: DC

## 2017-09-08 DIAGNOSIS — H02821 Cysts of right upper eyelid: Secondary | ICD-10-CM | POA: Diagnosis not present

## 2017-09-08 DIAGNOSIS — H11151 Pinguecula, right eye: Secondary | ICD-10-CM | POA: Diagnosis not present

## 2017-09-08 DIAGNOSIS — H11441 Conjunctival cysts, right eye: Secondary | ICD-10-CM | POA: Diagnosis not present

## 2017-09-08 DIAGNOSIS — H10811 Pingueculitis, right eye: Secondary | ICD-10-CM | POA: Diagnosis not present

## 2017-09-20 ENCOUNTER — Other Ambulatory Visit: Payer: Self-pay | Admitting: Internal Medicine

## 2017-09-20 DIAGNOSIS — Z1231 Encounter for screening mammogram for malignant neoplasm of breast: Secondary | ICD-10-CM

## 2017-10-09 NOTE — Congregational Nurse Program (Signed)
Congregational Nurse Program Note  Date of Encounter: 10/09/2017  Past Medical History: Past Medical History:  Diagnosis Date  . Allergy   . Arthritis   . Carotid artery occlusion   . GERD (gastroesophageal reflux disease)   . Hypertension   . Joint pain   . Thyroid disease     Encounter Details: CNP Questionnaire - 10/09/17 1724      Questionnaire   Insurance  Medicare    Uninsured  Not Applicable

## 2017-10-12 ENCOUNTER — Ambulatory Visit
Admission: RE | Admit: 2017-10-12 | Discharge: 2017-10-12 | Disposition: A | Discharge status: Home / Self Care | Payer: Medicare Other | Source: Ambulatory Visit | Attending: Internal Medicine | Admitting: Internal Medicine

## 2017-10-12 DIAGNOSIS — Z1231 Encounter for screening mammogram for malignant neoplasm of breast: Secondary | ICD-10-CM | POA: Diagnosis not present

## 2017-11-07 ENCOUNTER — Ambulatory Visit: Payer: Medicare Other | Attending: Internal Medicine | Admitting: Internal Medicine

## 2017-11-07 ENCOUNTER — Encounter: Payer: Self-pay | Admitting: Internal Medicine

## 2017-11-07 VITALS — BP 159/71 | HR 77 | Temp 98.1°F | Resp 16 | Wt 131.2 lb

## 2017-11-07 DIAGNOSIS — G500 Trigeminal neuralgia: Secondary | ICD-10-CM

## 2017-11-07 DIAGNOSIS — H3530 Unspecified macular degeneration: Secondary | ICD-10-CM | POA: Diagnosis not present

## 2017-11-07 DIAGNOSIS — E785 Hyperlipidemia, unspecified: Secondary | ICD-10-CM | POA: Diagnosis not present

## 2017-11-07 DIAGNOSIS — Z79899 Other long term (current) drug therapy: Secondary | ICD-10-CM | POA: Diagnosis not present

## 2017-11-07 DIAGNOSIS — R358 Other polyuria: Secondary | ICD-10-CM | POA: Diagnosis not present

## 2017-11-07 DIAGNOSIS — Z87891 Personal history of nicotine dependence: Secondary | ICD-10-CM | POA: Diagnosis not present

## 2017-11-07 DIAGNOSIS — E039 Hypothyroidism, unspecified: Secondary | ICD-10-CM | POA: Diagnosis not present

## 2017-11-07 DIAGNOSIS — Z79890 Hormone replacement therapy (postmenopausal): Secondary | ICD-10-CM | POA: Diagnosis not present

## 2017-11-07 DIAGNOSIS — R3589 Other polyuria: Secondary | ICD-10-CM

## 2017-11-07 DIAGNOSIS — L853 Xerosis cutis: Secondary | ICD-10-CM | POA: Diagnosis not present

## 2017-11-07 DIAGNOSIS — Z7982 Long term (current) use of aspirin: Secondary | ICD-10-CM | POA: Diagnosis not present

## 2017-11-07 DIAGNOSIS — R634 Abnormal weight loss: Secondary | ICD-10-CM

## 2017-11-07 DIAGNOSIS — I10 Essential (primary) hypertension: Secondary | ICD-10-CM

## 2017-11-07 MED ORDER — GABAPENTIN 100 MG PO CAPS
100.0000 mg | ORAL_CAPSULE | Freq: Every day | ORAL | 3 refills | Status: DC
Start: 2017-11-07 — End: 2017-11-29

## 2017-11-07 MED ORDER — AMLODIPINE BESYLATE 5 MG PO TABS: 2.5000 mg | ORAL_TABLET | Freq: Every day | ORAL | 3 refills | Status: DC

## 2017-11-07 NOTE — Progress Notes (Signed)
Patient ID: [name], female    DOB: May 16, 1940  MRN: [id number]  CC: Hypertension   Subjective: [name] is a 77 y.o. female who presents for chronic ds management. Daughter is with her. Her concerns today include:  HTN, hypothyroid, HL, macular degen,vertigo, claudication due to right subclavian stenosis status post right carotid to subclavian anastomosis 12/2009, right CCA stenosis status post PTA/stent 02/2012  C/o frequent urination x 2 mths.  Occurs during the day and night.  She denies frequent thirst.  She normally drinks a lot of water as a way of curbing her appetite.  She keeps an 8 ounce glass of water at her bedside at nights.  Denies any dysuria or hematuria.  C/o generalized itching and dry skin x > 6 mths. Worse in cold or hot temps -uses a lot of cleaning chemicals at home; she does not wear gloves when using these cleaning chemicals.   -She has tried different types of soaps and body washes over-the-counter.     HTN: checking once a wk.  Was high when she was having flare of BP when trigeminal neuralgia flared.  Recent  [id number].  Wgh loss:  Intentional.  Eating less meat and bread.  Eating more veggies Reports good appetite.  Daughter is vegetarian.   Trigeminal neuralgia: she purchase the Voltaren gel and uses it intermittently.  It helps a little.  Pain extends from the right ear over to the right cheek.  Occurs several times a week. Worse with rainy weather.  Saw vascular recently and had follow-up carotid Dopplers that looked okay.  Told to follow-up in 9 months Patient Active Problem List   Diagnosis Date Noted  . Essential hypertension 06/06/2017  . Hyperlipidemia 06/06/2017  . Leg cramps 06/06/2017  . Vertigo 06/06/2017  . Macular degeneration 06/06/2017  . Hypothyroidism 06/06/2017  . Carotid stenosis 01/22/2014  . Aftercare following surgery of the circulatory system, [place] 01/02/2013  . Occlusion and stenosis of carotid artery without  mention of cerebral infarction 04/27/2011     Current Outpatient Medications on File Prior to Visit  Medication Sig Dispense Refill  . acetaminophen (TYLENOL) 325 MG tablet Take 650 mg every 6 (six) hours as needed by mouth for mild pain or headache.     Marland Kitchen aspirin 81 MG chewable tablet Chew 81 mg by mouth daily.    . calcium-vitamin D (OSCAL WITH D) 500-200 MG-UNIT per tablet Take 1 tablet by mouth daily with breakfast.    . cetirizine (ZYRTEC) 10 MG tablet Take 10 mg daily as needed by mouth for allergies.     Marland Kitchen clopidogrel (PLAVIX) 75 MG tablet Take 1 tablet (75 mg total) by mouth daily. 90 tablet 3  . diazepam (VALIUM) 2 MG tablet Take 0.5 tablets (1 mg total) by mouth every 12 (twelve) hours as needed (vertigo). 15 tablet 1  . diclofenac sodium (VOLTAREN) 1 % GEL Apply 2 g topically 4 (four) times daily. 2 g 0  . docusate sodium (COLACE) 100 MG capsule Take 1 capsule (100 mg total) by mouth daily as needed for mild constipation. 30 capsule 2  . latanoprost (XALATAN) 0.005 % ophthalmic solution Place 1 drop at bedtime into both eyes.    Marland Kitchen levothyroxine (SYNTHROID, LEVOTHROID) 75 MCG tablet Take 1 tablet (75 mcg total) by mouth daily before breakfast. 90 tablet 3  . lisinopril (PRINIVIL,ZESTRIL) 20 MG tablet Take 1 tablet (20 mg total) by mouth daily. 90 tablet 3  . metoprolol succinate (TOPROL-XL) 25 MG  24 hr tablet Take 1 tablet (25 mg total) by mouth daily. 90 tablet 3  . Multiple Vitamins-Minerals (ALIVE WOMENS ENERGY) TABS Take 1 tablet by mouth daily.    . Probiotic Product (PROBIOTIC PO) Take 1 capsule by mouth daily.    . simvastatin (ZOCOR) 20 MG tablet Take 20 mg at bedtime by mouth.      No current facility-administered medications on file prior to visit.     Allergies  Allergen Reactions  . Meclizine Nausea And Vomiting    Social History   Socioeconomic History  . Marital status: Single    Spouse name: Not on file  . Number of children: Not on file  . Years of  education: Not on file  . Highest education level: Not on file  Occupational History  . Not on file  Social Needs  . Financial resource strain: Not on file  . Food insecurity:    Worry: Not on file    Inability: Not on file  . Transportation needs:    Medical: Not on file    Non-medical: Not on file  Tobacco Use  . Smoking status: Former Smoker    Packs/day: 0.30    Years: 5.00    Pack years: 1.50    Types: Cigarettes    Last attempt to quit: 04/12/1980    Years since quitting: 37.5  . Smokeless tobacco: Never Used  Substance and Sexual Activity  . Alcohol use: No    Alcohol/week: 0.0 oz  . Drug use: No  . Sexual activity: Not on file  Lifestyle  . Physical activity:    Days per week: Not on file    Minutes per session: Not on file  . Stress: Not on file  Relationships  . Social connections:    Talks on phone: Not on file    Gets together: Not on file    Attends religious service: Not on file    Active member of club or organization: Not on file    Attends meetings of clubs or organizations: Not on file    Relationship status: Not on file  . Intimate partner violence:    Fear of current or ex partner: Not on file    Emotionally abused: Not on file    Physically abused: Not on file    Forced sexual activity: Not on file  Other Topics Concern  . Not on file  Social History Narrative  . Not on file    Family History  Problem Relation Age of Onset  . Heart failure Mother   . Cancer Father     Past Surgical History:  Procedure Laterality Date  . ABDOMINAL HYSTERECTOMY    . AORTIC ARCH ANGIOGRAPHY N/A 03/01/2017   Procedure: AORTIC ARCH ANGIOGRAPHY;  Surgeon: Serafina [name], MD;  Location: [facility];  Service: Cardiovascular;  Laterality: N/A;  . ARCH AORTOGRAM N/A 05/04/2011   Procedure: ARCH AORTOGRAM;  Surgeon: Serafina [name], MD;  Location: [facility] CATH LAB;  Service: Cardiovascular;  Laterality: N/A;  . ARCH AORTOGRAM N/A 07/04/2012   Procedure: ARCH  AORTOGRAM;  Surgeon: Serafina [name], MD;  Location: [facility] CATH LAB;  Service: Cardiovascular;  Laterality: N/A;  . CAROTID ANGIOGRAM  July 04, 2012  . CAROTID ANGIOGRAM N/A 05/04/2011   Procedure: CAROTID ANGIOGRAM;  Surgeon: Serafina [name], MD;  Location: [facility] CATH LAB;  Service: Cardiovascular;  Laterality: N/A;  . CAROTID ANGIOGRAM N/A 07/04/2012   Procedure: CAROTID ANGIOGRAM;  Surgeon: Serafina [name],  MD;  Location: [place] CATH LAB;  Service: Cardiovascular;  Laterality: N/A;  . CAROTID STENT INSERTION Right 05/11/2011   Procedure: CAROTID STENT INSERTION;  Surgeon: Serafina [name], MD;  Location: [facility] CATH LAB;  Service: Cardiovascular;  Laterality: Right;  . carotid subclavian anastomosis  05/11/11   Right subclav. artery transected 12/17/09  . CATARACT EXTRACTION     bilateral  . EYE SURGERY      ROS: Review of Systems Negative except as stated above PHYSICAL EXAM: BP (!) 159/71   Pulse 77   Temp 98.1 F (36.7 C) (Oral)   Resp 16   Wt 131 lb 3.2 oz (59.5 kg)   SpO2 95%   BMI 24.00 kg/m   Wt Readings from Last 3 Encounters:  11/07/17 131 lb 3.2 oz (59.5 kg)  08/22/17 135 lb (61.2 kg)  08/05/17 139 lb (63 kg)   Repeat blood pressure 150/60. Blood pressure reviewed on recent visit with vascular surgeon.  It was also elevated. Physical Exam  General appearance - alert, well appearing, elderly female and in no distress Mental status - normal mood, behavior, speech, dress, motor activity, and thought processes Mouth - mucous membranes moist, pharynx normal without lesions Neck - supple, no significant adenopathy Chest -slightly decreased breath sounds but without wheezing or rhonchi's Heart - normal rate, regular rhythm, normal S1, S2, no murmurs, rubs, clicks or gallops Extremities - peripheral pulses normal, no pedal edema, no clubbing or cyanosis Skin: Very dry skin on extremities.    Chemistry      Component Value Date/Time   NA 143 06/06/2017 1644   K 4.1 06/06/2017 1644     CL 107 (H) 06/06/2017 1644   CO2 21 06/06/2017 1644   BUN 8 06/06/2017 1644   CREATININE 0.83 06/06/2017 1644      Component Value Date/Time   CALCIUM 9.8 06/06/2017 1644   CALCIUM 7.3 (L) 04/18/2008 1710   ALKPHOS 81 06/06/2017 1644   AST 47 (H) 06/06/2017 1644   ALT 25 06/06/2017 1644   BILITOT 0.4 06/06/2017 1644      ASSESSMENT AND PLAN: 1. Polyuria -Patient may need to drink less fluid at bedtime.  Symptoms does not suggest diabetes.  2. Dry skin dermatitis Advised to wear gloves when using household cleaning chemicals. Advised to use a moisturizing lotion when she steps out of the shower.  She can try Eucerin or Aveeno intensive care lotion.  Try using a mild unscented soap like golf.  3. Essential hypertension Not at goal.  Add low-dose of amlodipine. - amLODipine (NORVASC) 5 MG tablet; Take 0.5 tablets (2.5 mg total) by mouth daily.  Dispense: 45 tablet; Refill: 3  4. Trigeminal neuralgia -Patient agreeable to trying low-dose of gabapentin which I think she would probably tolerate better than Tegretol. - gabapentin (NEURONTIN) 100 MG capsule; Take 1 capsule (100 mg total) by mouth at bedtime.  Dispense: 30 capsule; Refill: 3  5. Weight loss Reportedly intentional.  However we will check TSH today to make sure she is on adequate dose of Levoxyl. - Comprehensive metabolic panel  6. Hypothyroidism, unspecified type - TSH  Patient was given the opportunity to ask questions.  Patient verbalized understanding of the plan and was able to repeat key elements of the plan.   Orders Placed This Encounter  Procedures  . TSH  . Comprehensive metabolic panel     Requested Prescriptions   Signed Prescriptions Disp Refills  . amLODipine (NORVASC) 5 MG tablet 45 tablet 3  Sig: Take 0.5 tablets (2.5 mg total) by mouth daily.  Marland Kitchen gabapentin (NEURONTIN) 100 MG capsule 30 capsule 3    Sig: Take 1 capsule (100 mg total) by mouth at bedtime.    Return in about 4 months  (around 03/10/2018).  Karle Plumber, MD, FACP

## 2017-11-07 NOTE — Patient Instructions (Signed)
Try using Vaseline or Eucerin or Aveeno Intensive Care Lotion after baths.  Your blood pressure is not at goal of 130/80 or lower.  We have added a low dose of a medication called Amlodipine 2.5 mg daily.  Start low dose Gabapentin at bedtime for the right sided facial pain.

## 2017-11-08 LAB — COMPREHENSIVE METABOLIC PANEL
ALBUMIN: 4.4 g/dL (ref 3.5–4.8)
ALT: 12 IU/L (ref 0–32)
AST: 26 IU/L (ref 0–40)
Albumin/Globulin Ratio: 1.4 (ref 1.2–2.2)
Alkaline Phosphatase: 85 IU/L (ref 39–117)
BUN/Creatinine Ratio: 11 — ABNORMAL LOW (ref 12–28)
BUN: 10 mg/dL (ref 8–27)
Bilirubin Total: 0.3 mg/dL (ref 0.0–1.2)
CALCIUM: 9.6 mg/dL (ref 8.7–10.3)
CO2: 18 mmol/L — AB (ref 20–29)
CREATININE: 0.89 mg/dL (ref 0.57–1.00)
Chloride: 107 mmol/L — ABNORMAL HIGH (ref 96–106)
GFR calc Af Amer: 71 mL/min/{1.73_m2} (ref >59)
GFR, EST NON AFRICAN AMERICAN: 62 mL/min/{1.73_m2} (ref >59)
Globulin, Total: 3.1 g/dL (ref 1.5–4.5)
Glucose: 93 mg/dL (ref 65–99)
Potassium: 4 mmol/L (ref 3.5–5.2)
SODIUM: 142 mmol/L (ref 134–144)
Total Protein: 7.5 g/dL (ref 6.0–8.5)

## 2017-11-08 LAB — TSH: TSH: 0.148 u[IU]/mL — ABNORMAL LOW (ref 0.450–4.500)

## 2017-11-09 ENCOUNTER — Telehealth: Payer: Self-pay

## 2017-11-09 ENCOUNTER — Other Ambulatory Visit: Payer: Self-pay | Admitting: Internal Medicine

## 2017-11-09 DIAGNOSIS — E039 Hypothyroidism, unspecified: Secondary | ICD-10-CM

## 2017-11-09 MED ORDER — LEVOTHYROXINE SODIUM 50 MCG PO TABS: 50.0000 ug | ORAL_TABLET | Freq: Every day | ORAL | 1 refills | Status: DC

## 2017-11-09 NOTE — Telephone Encounter (Signed)
Contacted pt to go over lab results pt is aware and doesn't have any questions or concerns

## 2017-11-16 ENCOUNTER — Other Ambulatory Visit: Payer: Self-pay | Admitting: Internal Medicine

## 2017-11-29 ENCOUNTER — Other Ambulatory Visit: Payer: Self-pay | Admitting: Internal Medicine

## 2017-11-29 DIAGNOSIS — G500 Trigeminal neuralgia: Secondary | ICD-10-CM

## 2018-01-18 DIAGNOSIS — H04123 Dry eye syndrome of bilateral lacrimal glands: Secondary | ICD-10-CM | POA: Diagnosis not present

## 2018-01-18 DIAGNOSIS — Z961 Presence of intraocular lens: Secondary | ICD-10-CM | POA: Diagnosis not present

## 2018-01-18 DIAGNOSIS — H05243 Constant exophthalmos, bilateral: Secondary | ICD-10-CM | POA: Diagnosis not present

## 2018-01-18 DIAGNOSIS — H401131 Primary open-angle glaucoma, bilateral, mild stage: Secondary | ICD-10-CM | POA: Diagnosis not present

## 2018-01-18 DIAGNOSIS — E0500 Thyrotoxicosis with diffuse goiter without thyrotoxic crisis or storm: Secondary | ICD-10-CM | POA: Diagnosis not present

## 2018-01-19 ENCOUNTER — Telehealth: Payer: Self-pay | Admitting: Internal Medicine

## 2018-01-19 NOTE — Telephone Encounter (Signed)
Patient ask for a thyroid test.

## 2018-01-20 ENCOUNTER — Ambulatory Visit: Payer: Medicare Other | Attending: Family Medicine

## 2018-01-20 DIAGNOSIS — E039 Hypothyroidism, unspecified: Secondary | ICD-10-CM | POA: Diagnosis not present

## 2018-01-20 NOTE — Telephone Encounter (Signed)
[name] could you please contact pt and schedule an lab visit

## 2018-01-20 NOTE — Progress Notes (Signed)
l

## 2018-01-20 NOTE — Telephone Encounter (Signed)
Appointment made

## 2018-01-21 LAB — TSH: TSH: 3.85 u[IU]/mL (ref 0.450–4.500)

## 2018-01-23 ENCOUNTER — Telehealth: Payer: Self-pay

## 2018-01-23 NOTE — Telephone Encounter (Signed)
Contacted pt to go over lab results pt didn't answer left a detailed vm informing pt of results and if she has any questions or concerns to give me a call  If pt calls back please give results: thyroid level is normal. Continue current dose of Levothyroxine.

## 2018-02-17 ENCOUNTER — Other Ambulatory Visit: Payer: Self-pay | Admitting: Internal Medicine

## 2018-02-17 DIAGNOSIS — E039 Hypothyroidism, unspecified: Secondary | ICD-10-CM

## 2018-02-26 ENCOUNTER — Other Ambulatory Visit: Payer: Self-pay | Admitting: Internal Medicine

## 2018-02-26 DIAGNOSIS — G500 Trigeminal neuralgia: Secondary | ICD-10-CM

## 2018-03-13 ENCOUNTER — Ambulatory Visit: Payer: Medicare Other | Admitting: Internal Medicine

## 2018-03-17 ENCOUNTER — Ambulatory Visit: Payer: Medicare Other | Attending: Internal Medicine | Admitting: Internal Medicine

## 2018-03-17 ENCOUNTER — Encounter: Payer: Self-pay | Admitting: Internal Medicine

## 2018-03-17 VITALS — BP 140/64 | HR 80 | Temp 97.7°F | Resp 16 | Wt 137.0 lb

## 2018-03-17 DIAGNOSIS — Z87891 Personal history of nicotine dependence: Secondary | ICD-10-CM | POA: Diagnosis not present

## 2018-03-17 DIAGNOSIS — R42 Dizziness and giddiness: Secondary | ICD-10-CM | POA: Insufficient documentation

## 2018-03-17 DIAGNOSIS — I739 Peripheral vascular disease, unspecified: Secondary | ICD-10-CM | POA: Diagnosis not present

## 2018-03-17 DIAGNOSIS — Z79899 Other long term (current) drug therapy: Secondary | ICD-10-CM | POA: Diagnosis not present

## 2018-03-17 DIAGNOSIS — G500 Trigeminal neuralgia: Secondary | ICD-10-CM | POA: Diagnosis not present

## 2018-03-17 DIAGNOSIS — E785 Hyperlipidemia, unspecified: Secondary | ICD-10-CM | POA: Diagnosis not present

## 2018-03-17 DIAGNOSIS — Z7982 Long term (current) use of aspirin: Secondary | ICD-10-CM | POA: Insufficient documentation

## 2018-03-17 DIAGNOSIS — E039 Hypothyroidism, unspecified: Secondary | ICD-10-CM | POA: Diagnosis not present

## 2018-03-17 DIAGNOSIS — Z79890 Hormone replacement therapy (postmenopausal): Secondary | ICD-10-CM | POA: Diagnosis not present

## 2018-03-17 DIAGNOSIS — I10 Essential (primary) hypertension: Secondary | ICD-10-CM | POA: Diagnosis not present

## 2018-03-17 DIAGNOSIS — Z8249 Family history of ischemic heart disease and other diseases of the circulatory system: Secondary | ICD-10-CM | POA: Insufficient documentation

## 2018-03-17 DIAGNOSIS — Z2821 Immunization not carried out because of patient refusal: Secondary | ICD-10-CM

## 2018-03-17 DIAGNOSIS — H3530 Unspecified macular degeneration: Secondary | ICD-10-CM | POA: Insufficient documentation

## 2018-03-17 NOTE — Progress Notes (Signed)
Patient ID: [name], female    DOB: 07/13/1940  MRN: [id number]  CC: Hypertension   Subjective: [name] is a 77 y.o. female who presents for chronic ds management.  Daughter is with her. 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    HTN:  She has not checked BP in past mth.  But it usually in the 120s. Limits salt in the food. Denies any chest pains, shortness of breath, lower extremity edema or headaches. She usually gets out and walks several times a week but has not done so recently due to colder weather.  However she states that she stays active around her house and in the yard.  HL: Reports compliance with simvastatin.  Hypothyroidism:  Compliant with Levothyroxine.  Last TSH done a month ago was normal.  She denies any palpitations.  Patient Active Problem List   Diagnosis Date Noted  . Essential hypertension 06/06/2017  . Hyperlipidemia 06/06/2017  . Leg cramps 06/06/2017  . Vertigo 06/06/2017  . Macular degeneration 06/06/2017  . Hypothyroidism 06/06/2017  . Carotid stenosis 01/22/2014  . Aftercare following surgery of the circulatory system, [place] 01/02/2013  . Occlusion and stenosis of carotid artery without mention of cerebral infarction 04/27/2011     Current Outpatient Medications on File Prior to Visit  Medication Sig Dispense Refill  . acetaminophen (TYLENOL) 325 MG tablet Take 650 mg every 6 (six) hours as needed by mouth for mild pain or headache.     Marland Kitchen amLODipine (NORVASC) 5 MG tablet Take 0.5 tablets (2.5 mg total) by mouth daily. 45 tablet 3  . aspirin 81 MG chewable tablet Chew 81 mg by mouth daily.    . calcium-vitamin D (OSCAL WITH D) 500-200 MG-UNIT per tablet Take 1 tablet by  mouth daily with breakfast.    . cetirizine (ZYRTEC) 10 MG tablet Take 10 mg daily as needed by mouth for allergies.     Marland Kitchen clopidogrel (PLAVIX) 75 MG tablet Take 1 tablet (75 mg total) by mouth daily. 90 tablet 3  . diazepam (VALIUM) 2 MG tablet Take 0.5 tablets (1 mg total) by mouth every 12 (twelve) hours as needed (vertigo). 15 tablet 1  . diclofenac sodium (VOLTAREN) 1 % GEL Apply 2 g topically 4 (four) times daily. 2 g 0  . docusate sodium (COLACE) 100 MG capsule Take 1 capsule (100 mg total) by mouth daily as needed for mild constipation. 30 capsule 2  . gabapentin (NEURONTIN) 100 MG capsule TAKE 1 CAPSULE (100 MG TOTAL) BY MOUTH AT BEDTIME. 90 capsule 0  . latanoprost (XALATAN) 0.005 % ophthalmic solution Place 1 drop at bedtime into both eyes.    Marland Kitchen levothyroxine (SYNTHROID, LEVOTHROID) 50 MCG tablet TAKE 1 TABLET BY MOUTH DAILY BEFORE BREAKFAST 30 tablet 2  . lisinopril (PRINIVIL,ZESTRIL) 20 MG tablet Take 1 tablet (20 mg total) by mouth daily. 90 tablet 3  . metoprolol succinate (TOPROL-XL) 25 MG 24 hr tablet Take 1 tablet (25 mg total) by mouth daily. 90 tablet 3  . Multiple Vitamins-Minerals (ALIVE WOMENS ENERGY) TABS Take 1 tablet by mouth daily.    . Probiotic Product (PROBIOTIC PO) Take 1 capsule by mouth daily.    . simvastatin (ZOCOR) 20 MG tablet TAKE 1 TABLET BY MOUTH EVERYDAY AT BEDTIME 90 tablet  1   No current facility-administered medications on file prior to visit.     Allergies  Allergen Reactions  . Meclizine Nausea And Vomiting    Social History   Socioeconomic History  . Marital status: Single    Spouse name: Not on file  . Number of children: Not on file  . Years of education: Not on file  . Highest education level: Not on file  Occupational History  . Not on file  Social Needs  . Financial resource strain: Not on file  . Food insecurity:    Worry: Not on file    Inability: Not on file  . Transportation needs:    Medical: Not on file    Non-medical:  Not on file  Tobacco Use  . Smoking status: Former Smoker    Packs/day: 0.30    Years: 5.00    Pack years: 1.50    Types: Cigarettes    Last attempt to quit: 04/12/1980    Years since quitting: 37.9  . Smokeless tobacco: Never Used  Substance and Sexual Activity  . Alcohol use: No    Alcohol/week: 0.0 standard drinks  . Drug use: No  . Sexual activity: Not on file  Lifestyle  . Physical activity:    Days per week: Not on file    Minutes per session: Not on file  . Stress: Not on file  Relationships  . Social connections:    Talks on phone: Not on file    Gets together: Not on file    Attends religious service: Not on file    Active member of club or organization: Not on file    Attends meetings of clubs or organizations: Not on file    Relationship status: Not on file  . Intimate partner violence:    Fear of current or ex partner: Not on file    Emotionally abused: Not on file    Physically abused: Not on file    Forced sexual activity: Not on file  Other Topics Concern  . Not on file  Social History Narrative  . Not on file    Family History  Problem Relation Age of Onset  . Heart failure Mother   . Cancer Father     Past Surgical History:  Procedure Laterality Date  . ABDOMINAL HYSTERECTOMY    . AORTIC ARCH ANGIOGRAPHY N/A 03/01/2017   Procedure: AORTIC ARCH ANGIOGRAPHY;  Surgeon: Serafina [name], MD;  Location: [facility];  Service: Cardiovascular;  Laterality: N/A;  . ARCH AORTOGRAM N/A 05/04/2011   Procedure: ARCH AORTOGRAM;  Surgeon: Serafina [name], MD;  Location: [facility] CATH LAB;  Service: Cardiovascular;  Laterality: N/A;  . ARCH AORTOGRAM N/A 07/04/2012   Procedure: ARCH AORTOGRAM;  Surgeon: Serafina [name], MD;  Location: [facility] CATH LAB;  Service: Cardiovascular;  Laterality: N/A;  . CAROTID ANGIOGRAM  July 04, 2012  . CAROTID ANGIOGRAM N/A 05/04/2011   Procedure: CAROTID ANGIOGRAM;  Surgeon: Serafina [name], MD;  Location: [facility] CATH LAB;  Service:  Cardiovascular;  Laterality: N/A;  . CAROTID ANGIOGRAM N/A 07/04/2012   Procedure: CAROTID ANGIOGRAM;  Surgeon: Serafina [name], MD;  Location: [facility] CATH LAB;  Service: Cardiovascular;  Laterality: N/A;  . CAROTID STENT INSERTION Right 05/11/2011   Procedure: CAROTID STENT INSERTION;  Surgeon: Serafina [name], MD;  Location: [facility] CATH LAB;  Service: Cardiovascular;  Laterality: Right;  . carotid subclavian anastomosis  05/11/11   Right subclav. artery transected 12/17/09  . CATARACT EXTRACTION  bilateral  . EYE SURGERY      ROS: Review of Systems  Constitutional: Negative for appetite change.       She is sleeping well.  Gastrointestinal:       Constipation a times depending on what she eats.  No blood in sools.     PHYSICAL EXAM: BP 140/64   Pulse 80   Temp 97.7 F (36.5 C) (Oral)   Resp 16   Wt 137 lb (62.1 kg)   SpO2 96%   BMI 25.06 kg/m   Wt Readings from Last 3 Encounters:  03/17/18 137 lb (62.1 kg)  11/07/17 131 lb 3.2 oz (59.5 kg)  08/22/17 135 lb (61.2 kg)  BP 140/64  Physical Exam  General appearance - alert, well appearing, and in no distress Mental status - normal mood, behavior, speech, dress, motor activity, and thought processes Neck - supple, no significant adenopathy Chest - clear to auscultation, no wheezes, rales or rhonchi, symmetric air entry Heart - normal rate, regular rhythm, normal S1, S2, no murmurs, rubs, clicks or gallops Extremities - peripheral pulses normal, no pedal edema, no clubbing or cyanosis   Results for orders placed or performed in visit on 01/20/18  TSH  Result Value Ref Range   TSH 3.850 0.450 - 4.500 uIU/mL    ASSESSMENT AND PLAN:  1. Essential hypertension Not at goal.  Patient to check blood pressure at least once to twice a week.  I have written down the goal of 130/80 or lower.  She will call me if her numbers are consistently higher than this.  2. Trigeminal neuralgia Doing well on gabapentin.  3. Hyperlipidemia,  unspecified hyperlipidemia type Continue simvastatin.  4. Hypothyroidism, acquired Continue current dose of levothyroxine  5. Influenza vaccination declined  6. Pneumococcal vaccination declined by patient   Patient was given the opportunity to ask questions.  Patient verbalized understanding of the plan and was able to repeat key elements of the plan.   No orders of the defined types were placed in this encounter.    Requested Prescriptions    No prescriptions requested or ordered in this encounter    Return in about 4 months (around 07/17/2018).  Karle Plumber, MD, FACP

## 2018-03-17 NOTE — Patient Instructions (Signed)
Your blood pressure is not at goal.  Please check your blood pressure at least once a week.  The goal is 130/80 or lower.  If you find that you are running consistently higher than that, please call and let me know so that I can tell you how to adjust your blood pressure medications.

## 2018-04-18 ENCOUNTER — Encounter: Payer: Self-pay | Admitting: Internal Medicine

## 2018-04-18 ENCOUNTER — Other Ambulatory Visit: Payer: Self-pay | Admitting: Internal Medicine

## 2018-04-18 DIAGNOSIS — R42 Dizziness and giddiness: Secondary | ICD-10-CM

## 2018-04-18 MED ORDER — DIAZEPAM 2 MG PO TABS: 1.0000 mg | ORAL_TABLET | Freq: Two times a day (BID) | ORAL | 1 refills | Status: DC | PRN

## 2018-04-18 NOTE — Progress Notes (Unsigned)
Rec request for RF on Diazepam.  NCCSRS reviewed and is appropriate.  Will RF.

## 2018-04-18 NOTE — Telephone Encounter (Signed)
Pt last seen: 03/17/18 Next appt: 07/18/18 Last RX written on: 06/06/17 Date of original fill: 09/08/17 Date of refill(s): 11/11/17  No other controlled substances were filled during this time, please refill if appropriate.

## 2018-04-18 NOTE — Telephone Encounter (Signed)
1) Medication(s) Requested (by name): -diazepam (VALIUM) 2 MG tablet   2) Pharmacy of Choice: -[id number] [place], [place] - [id number]) Special Requests:   Approved medications will be sent to the pharmacy, we will reach out if there is an issue.  Requests made after 3pm may not be addressed until the following business day!  If a patient is unsure of the name of the medication(s) please note and ask patient to call back when they are able to provide all info, do not send to responsible party until all information is available!

## 2018-04-20 ENCOUNTER — Other Ambulatory Visit: Payer: Self-pay

## 2018-04-20 DIAGNOSIS — R42 Dizziness and giddiness: Secondary | ICD-10-CM

## 2018-04-20 MED ORDER — DIAZEPAM 2 MG PO TABS: 1.0000 mg | ORAL_TABLET | Freq: Two times a day (BID) | ORAL | 1 refills | Status: DC | PRN

## 2018-04-20 NOTE — Progress Notes (Signed)
Printed rx for Dr. Wynetta Emery signature.  Pt will be by today to pick up rx

## 2018-04-21 ENCOUNTER — Telehealth: Payer: Self-pay | Admitting: Internal Medicine

## 2018-04-21 NOTE — Telephone Encounter (Signed)
1) Medication(s) Requested (by name): diazepam (VALIUM) 2 MG tablet  *pharmacy says they didn't receive it. Please follow up. 2) Pharmacy of Choice: [id number] [place], [place] - [address]

## 2018-04-21 NOTE — Telephone Encounter (Signed)
RX was printed, it should be at the front desk for pickup

## 2018-04-29 ENCOUNTER — Other Ambulatory Visit: Payer: Self-pay | Admitting: Internal Medicine

## 2018-05-15 ENCOUNTER — Other Ambulatory Visit: Payer: Self-pay | Admitting: Internal Medicine

## 2018-05-15 DIAGNOSIS — E039 Hypothyroidism, unspecified: Secondary | ICD-10-CM

## 2018-05-24 ENCOUNTER — Other Ambulatory Visit: Payer: Self-pay | Admitting: Internal Medicine

## 2018-05-24 DIAGNOSIS — G500 Trigeminal neuralgia: Secondary | ICD-10-CM

## 2018-06-13 ENCOUNTER — Other Ambulatory Visit: Payer: Self-pay | Admitting: Internal Medicine

## 2018-07-18 ENCOUNTER — Other Ambulatory Visit: Payer: Self-pay

## 2018-07-18 ENCOUNTER — Ambulatory Visit: Payer: Medicare Other | Attending: Internal Medicine | Admitting: Internal Medicine

## 2018-07-18 DIAGNOSIS — I10 Essential (primary) hypertension: Secondary | ICD-10-CM | POA: Diagnosis not present

## 2018-07-18 DIAGNOSIS — E785 Hyperlipidemia, unspecified: Secondary | ICD-10-CM | POA: Diagnosis not present

## 2018-07-18 DIAGNOSIS — E039 Hypothyroidism, unspecified: Secondary | ICD-10-CM | POA: Diagnosis not present

## 2018-07-18 DIAGNOSIS — R0981 Nasal congestion: Secondary | ICD-10-CM

## 2018-07-18 MED ORDER — FLUTICASONE PROPIONATE 50 MCG/ACT NA SUSP: 1.0000 | Freq: Every day | NASAL | 0 refills | Status: DC | PRN

## 2018-07-18 NOTE — Progress Notes (Signed)
Virtual Visit via Telephone Note  I connected with [name] on 07/18/18 at 10:05 a.m by telephone from my office and verified that I am speaking with the correct person using two identifiers. Pt is at home.  Daughter,participated in this encounter by being on a second line.   I discussed the limitations, risks, security and privacy concerns of performing an evaluation and management service by telephone and the availability of in person appointments. I also discussed with the patient that there may be a patient responsible charge related to this service. The patient expressed understanding and agreed to proceed.   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 _8  Yes    _9  No Chest Pain?: _10  Yes    _11  No Leg swelling?: _12  Yes    _13  No Headaches?: _14  Yes    _15  No Dizziness? _16  Yes    _17  No Comments: She gets outside daily to walk down to her mailbox or getting out on her deck to get fresh air.  HL: Compliant with simvastatin.  Hypothyroidism: Reports compliance with levothyroxine.  Denies any palpitations, diarrhea/constipation or heat/cold intolerance.  c/o Some sinus congestion with runny nose.  She takes the Zyrtec as needed.  Denies any frontal headache.  Observations/Objective: No direct observation was done as this was a telephone visit.  Assessment and Plan: 1. Essential hypertension Home blood pressure readings are at goal of 130/80 or lower.  She will continue  current blood pressure medications and low-salt diet  2. Hypothyroidism, acquired Last TSH in October 2019 was 3.8.  She will continue current dose of levothyroxine.  Plan to recheck TSH on next office visit.  3. Hyperlipidemia, unspecified hyperlipidemia type Continue Zocor.  4. Sinus congestion Continue Zyrtec as needed.  I have recommended using Flonase nasal spray also as needed.  Prescription sent to her pharmacy.   Follow Up Instructions: F/u in 3 mths   I discussed the assessment and treatment plan with the patient. The patient was provided an opportunity to ask questions and all were answered. The patient agreed with the plan and demonstrated an understanding of the instructions.   The patient was advised to call back or seek an in-person evaluation if the symptoms worsen or if the condition fails to improve as anticipated.  I provided 8 minutes of non-face-to-face time during this encounter.   Karle Plumber, MD

## 2018-08-01 ENCOUNTER — Telehealth: Payer: Self-pay | Admitting: Internal Medicine

## 2018-08-01 NOTE — Telephone Encounter (Signed)
1) Medication(s) Requested (by name): lisinopril  metoprolol succinate  2) Pharmacy [facility] Choice: [id number] [place],  - [id number]) Special Requests: Pt stated she called pharmacy first and they stated she needed to call her dr's office  Approved medications will be sent to the pharmacy, we will reach out if there is an issue.  Requests made after 3pm may not be addressed until the following business day!  If a patient is unsure of the name of the medication(s) please note and ask patient to call back when they are able to provide all info, do not send to responsible party until all information is available!

## 2018-08-02 ENCOUNTER — Other Ambulatory Visit: Payer: Self-pay | Admitting: Internal Medicine

## 2018-08-02 DIAGNOSIS — I10 Essential (primary) hypertension: Secondary | ICD-10-CM

## 2018-08-07 ENCOUNTER — Other Ambulatory Visit: Payer: Self-pay | Admitting: Internal Medicine

## 2018-08-07 DIAGNOSIS — I10 Essential (primary) hypertension: Secondary | ICD-10-CM

## 2018-08-08 ENCOUNTER — Other Ambulatory Visit: Payer: Self-pay | Admitting: Internal Medicine

## 2018-08-08 DIAGNOSIS — E039 Hypothyroidism, unspecified: Secondary | ICD-10-CM

## 2018-08-09 ENCOUNTER — Other Ambulatory Visit: Payer: Self-pay | Admitting: Internal Medicine

## 2018-08-09 DIAGNOSIS — R0981 Nasal congestion: Secondary | ICD-10-CM

## 2018-08-18 ENCOUNTER — Other Ambulatory Visit: Payer: Self-pay | Admitting: Internal Medicine

## 2018-08-18 DIAGNOSIS — K5900 Constipation, unspecified: Secondary | ICD-10-CM

## 2018-09-24 ENCOUNTER — Other Ambulatory Visit: Payer: Self-pay | Admitting: Internal Medicine

## 2018-10-04 ENCOUNTER — Other Ambulatory Visit: Payer: Self-pay | Admitting: Internal Medicine

## 2018-10-05 ENCOUNTER — Other Ambulatory Visit: Payer: Self-pay | Admitting: Internal Medicine

## 2018-10-05 DIAGNOSIS — I10 Essential (primary) hypertension: Secondary | ICD-10-CM

## 2018-10-23 ENCOUNTER — Ambulatory Visit: Payer: Medicare Other | Attending: Internal Medicine | Admitting: Internal Medicine

## 2018-10-23 ENCOUNTER — Other Ambulatory Visit: Payer: Self-pay

## 2018-10-23 ENCOUNTER — Encounter: Payer: Self-pay | Admitting: Internal Medicine

## 2018-10-23 DIAGNOSIS — R0981 Nasal congestion: Secondary | ICD-10-CM | POA: Diagnosis not present

## 2018-10-23 DIAGNOSIS — E039 Hypothyroidism, unspecified: Secondary | ICD-10-CM

## 2018-10-23 DIAGNOSIS — I10 Essential (primary) hypertension: Secondary | ICD-10-CM

## 2018-10-23 DIAGNOSIS — E785 Hyperlipidemia, unspecified: Secondary | ICD-10-CM | POA: Diagnosis not present

## 2018-10-23 DIAGNOSIS — K5900 Constipation, unspecified: Secondary | ICD-10-CM | POA: Diagnosis not present

## 2018-10-23 MED ORDER — DOCUSATE SODIUM 100 MG PO CAPS: 100.0000 mg | ORAL_CAPSULE | Freq: Every day | ORAL | 2 refills | Status: DC | PRN

## 2018-10-23 MED ORDER — LISINOPRIL 20 MG PO TABS: 20.0000 mg | ORAL_TABLET | Freq: Every day | ORAL | 1 refills | Status: DC

## 2018-10-23 MED ORDER — FLUTICASONE PROPIONATE 50 MCG/ACT NA SUSP: 1.0000 | Freq: Every day | NASAL | 2 refills | Status: DC | PRN

## 2018-10-23 NOTE — Progress Notes (Signed)
Virtual Visit via Telephone Note Due to current restrictions/limitations of in-office visits due to the COVID-19 pandemic, this scheduled clinical appointment was converted to a telehealth visit  I connected with [name] on 10/23/18 at 1:37 p.m EDT by telephone and verified that I am speaking with the correct person using two identifiers. I am in my office.  The patient is at home.  Only the patient, her daughter and myself participated in this encounter.  I discussed the limitations, risks, security and privacy concerns of performing an evaluation and management service by telephone and the availability of in person appointments. I also discussed with the patient that there may be a patient responsible charge related to this service. The patient expressed understanding and agreed to proceed.   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  The purpose of today's visit is chronic disease management.  HYPERTENSION Currently taking: see medication list Med Adherence: _0  Yes    _1  No Medication side effects: _2  Yes    _3  No Adherence with salt restriction: _4  Yes    _5  No Home Monitoring?: _6  Yes    _7  No Monitoring Frequency: Once a day Home BP results range: 123/62 this a.m, 101/54, 121/70 SOB? _8  Yes    _9  No Chest Pain?: _10  Yes    _11  No Leg swelling?: _12  Yes    _13  No Headaches?: _14  Yes    _15  No Dizziness? _16  Yes    _17  No Comments:    HL:  Tolerating Zocor.  Denies any muscle cramps  Hypothyroidism: Reports compliance with levothyroxine.  Denies any feeling hot or cold all the time.  No significant diarrhea or constipation.  Requests refill on Colace.   Outpatient Encounter Medications as of 10/23/2018  Medication Sig  . acetaminophen (TYLENOL) 325 MG tablet Take 650 mg every 6 (six)  hours as needed by mouth for mild pain or headache.   Marland Kitchen amLODipine (NORVASC) 5 MG tablet TAKE 1/2 TABLET BY MOUTH DAILY  . aspirin 81 MG chewable tablet Chew 81 mg by mouth daily.  . calcium-vitamin D (OSCAL WITH D) 500-200 MG-UNIT per tablet Take 1 tablet by mouth daily with breakfast.  . cetirizine (ZYRTEC) 10 MG tablet Take 10 mg daily as needed by mouth for allergies.   Marland Kitchen clopidogrel (PLAVIX) 75 MG tablet TAKE 1 TABLET BY MOUTH EVERY DAY  . diazepam (VALIUM) 2 MG tablet Take 0.5 tablets (1 mg total) by mouth every 12 (twelve) hours as needed (vertigo).  Marland Kitchen diclofenac sodium (VOLTAREN) 1 % GEL Apply 2 g topically 4 (four) times daily.  Marland Kitchen docusate sodium (COLACE) 100 MG capsule TAKE 1 CAPSULE (100 MG TOTAL) BY MOUTH DAILY AS NEEDED FOR MILD CONSTIPATION.  . fluticasone (FLONASE) 50 MCG/ACT nasal spray PLACE 1 SPRAY INTO BOTH NOSTRILS DAILY AS NEEDED FOR ALLERGIES OR RHINITIS (FOR SINUS CONGESTION).  Marland Kitchen gabapentin (NEURONTIN) 100 MG capsule TAKE 1 CAPSULE (100 MG TOTAL) BY MOUTH AT BEDTIME.  Marland Kitchen latanoprost (XALATAN) 0.005 % ophthalmic solution Place 1 drop at bedtime into both eyes.  Marland Kitchen levothyroxine (SYNTHROID) 50 MCG tablet TAKE 1 TABLET BY MOUTH EVERY DAY BEFORE BREAKFAST  . lisinopril (ZESTRIL) 20 MG tablet TAKE 1 TABLET BY MOUTH EVERY DAY  . metoprolol succinate (TOPROL-XL) 25 MG 24 hr tablet TAKE 1 TABLET BY MOUTH EVERY DAY  . Multiple Vitamins-Minerals (ALIVE WOMENS ENERGY) TABS Take  1 tablet by mouth daily.  . Probiotic Product (PROBIOTIC PO) Take 1 capsule by mouth daily.  . simvastatin (ZOCOR) 20 MG tablet TAKE 1 TABLET BY MOUTH EVERYDAY AT BEDTIME   No facility-administered encounter medications on file as of 10/23/2018.       Observations/Objective: No direct observation done as this was a telephone encounter.  Assessment and Plan: 1. Essential hypertension Home blood pressure readings are at goal.  Continue current medications and low-salt diet - lisinopril (ZESTRIL) 20 MG  tablet; Take 1 tablet (20 mg total) by mouth daily.  Dispense: 90 tablet; Refill: 1 - CBC; Future - Comprehensive metabolic panel; Future - Lipid panel; Future - TSH; Future  2. Hypothyroidism, acquired Continue levothyroxine.  Plan to check TSH  3. Hyperlipidemia, unspecified hyperlipidemia type Continue Zocor  4. Sinus congestion - fluticasone (FLONASE) 50 MCG/ACT nasal spray; Place 1 spray into both nostrils daily as needed for allergies or rhinitis (for sinus congestion).  Dispense: 16 g; Refill: 2  5. Constipation, unspecified constipation type - docusate sodium (COLACE) 100 MG capsule; Take 1 capsule (100 mg total) by mouth daily as needed for mild constipation.  Dispense: 30 capsule; Refill: 2   Follow Up Instructions: Follow-up in 3 to 4 months   I discussed the assessment and treatment plan with the patient. The patient was provided an opportunity to ask questions and all were answered. The patient agreed with the plan and demonstrated an understanding of the instructions.   The patient was advised to call back or seek an in-person evaluation if the symptoms worsen or if the condition fails to improve as anticipated.  I provided 6 minutes of non-face-to-face time during this encounter.   Karle Plumber, MD

## 2018-10-24 ENCOUNTER — Other Ambulatory Visit: Payer: Self-pay | Admitting: Internal Medicine

## 2018-10-24 ENCOUNTER — Other Ambulatory Visit: Payer: Self-pay

## 2018-10-24 ENCOUNTER — Ambulatory Visit: Payer: Medicare Other | Attending: Internal Medicine

## 2018-10-24 ENCOUNTER — Telehealth: Payer: Self-pay | Admitting: Internal Medicine

## 2018-10-24 DIAGNOSIS — I10 Essential (primary) hypertension: Secondary | ICD-10-CM | POA: Diagnosis not present

## 2018-10-24 NOTE — Telephone Encounter (Signed)
LVM to call back and schedule

## 2018-10-24 NOTE — Telephone Encounter (Signed)
-----  Message from Jackelyn Knife, [place] sent at 10/23/2018  1:49 PM EDT ----- Please contact pt and schedule a 3-4 month f/u

## 2018-10-25 ENCOUNTER — Other Ambulatory Visit: Payer: Self-pay | Admitting: Internal Medicine

## 2018-10-25 DIAGNOSIS — E039 Hypothyroidism, unspecified: Secondary | ICD-10-CM

## 2018-10-25 LAB — LIPID PANEL
Chol/HDL Ratio: 2.3 ratio (ref 0.0–4.4)
Cholesterol, Total: 147 mg/dL (ref 100–199)
HDL: 63 mg/dL (ref >39)
LDL Calculated: 67 mg/dL (ref 0–99)
Triglycerides: 83 mg/dL (ref 0–149)
VLDL Cholesterol Cal: 17 mg/dL (ref 5–40)

## 2018-10-25 LAB — CBC
Hematocrit: 40.1 % (ref 34.0–46.6)
Hemoglobin: 12.6 g/dL (ref 11.1–15.9)
MCH: 30.4 pg (ref 26.6–33.0)
MCHC: 31.4 g/dL — ABNORMAL LOW (ref 31.5–35.7)
MCV: 97 fL (ref 79–97)
Platelets: 253 10*3/uL (ref 150–450)
RBC: 4.14 x10E6/uL (ref 3.77–5.28)
RDW: 13.4 % (ref 11.7–15.4)
WBC: 9.8 10*3/uL (ref 3.4–10.8)

## 2018-10-25 LAB — COMPREHENSIVE METABOLIC PANEL
ALT: 17 IU/L (ref 0–32)
AST: 29 IU/L (ref 0–40)
Albumin/Globulin Ratio: 1.3 (ref 1.2–2.2)
Albumin: 4.5 g/dL (ref 3.7–4.7)
Alkaline Phosphatase: 77 IU/L (ref 39–117)
BUN/Creatinine Ratio: 12 (ref 12–28)
BUN: 11 mg/dL (ref 8–27)
Bilirubin Total: 0.3 mg/dL (ref 0.0–1.2)
CO2: 20 mmol/L (ref 20–29)
Calcium: 9.4 mg/dL (ref 8.7–10.3)
Chloride: 105 mmol/L (ref 96–106)
Creatinine, Ser: 0.92 mg/dL (ref 0.57–1.00)
GFR calc Af Amer: 68 mL/min/{1.73_m2} (ref >59)
GFR calc non Af Amer: 59 mL/min/{1.73_m2} — ABNORMAL LOW (ref >59)
Globulin, Total: 3.5 g/dL (ref 1.5–4.5)
Glucose: 84 mg/dL (ref 65–99)
Potassium: 4.5 mmol/L (ref 3.5–5.2)
Sodium: 143 mmol/L (ref 134–144)
Total Protein: 8 g/dL (ref 6.0–8.5)

## 2018-10-25 LAB — TSH: TSH: 4.59 u[IU]/mL — ABNORMAL HIGH (ref 0.450–4.500)

## 2018-10-25 MED ORDER — LEVOTHYROXINE SODIUM 50 MCG PO TABS: ORAL_TABLET | ORAL | 0 refills | Status: DC

## 2018-10-26 ENCOUNTER — Telehealth: Payer: Self-pay

## 2018-10-26 DIAGNOSIS — H05243 Constant exophthalmos, bilateral: Secondary | ICD-10-CM | POA: Diagnosis not present

## 2018-10-26 DIAGNOSIS — H04123 Dry eye syndrome of bilateral lacrimal glands: Secondary | ICD-10-CM | POA: Diagnosis not present

## 2018-10-26 DIAGNOSIS — E0500 Thyrotoxicosis with diffuse goiter without thyrotoxic crisis or storm: Secondary | ICD-10-CM | POA: Diagnosis not present

## 2018-10-26 DIAGNOSIS — Z961 Presence of intraocular lens: Secondary | ICD-10-CM | POA: Diagnosis not present

## 2018-10-26 DIAGNOSIS — H401131 Primary open-angle glaucoma, bilateral, mild stage: Secondary | ICD-10-CM | POA: Diagnosis not present

## 2018-10-26 NOTE — Telephone Encounter (Signed)
Contacted pt to go over lab results pt didn't answer lvm asking pt to give me a call at her earliest convenience

## 2018-10-27 ENCOUNTER — Other Ambulatory Visit: Payer: Self-pay | Admitting: Internal Medicine

## 2018-10-27 DIAGNOSIS — I10 Essential (primary) hypertension: Secondary | ICD-10-CM

## 2018-11-17 ENCOUNTER — Other Ambulatory Visit: Payer: Self-pay | Admitting: Internal Medicine

## 2018-11-17 DIAGNOSIS — Z1231 Encounter for screening mammogram for malignant neoplasm of breast: Secondary | ICD-10-CM

## 2018-11-18 ENCOUNTER — Encounter (HOSPITAL_COMMUNITY): Payer: Self-pay | Admitting: *Deleted

## 2018-11-18 ENCOUNTER — Other Ambulatory Visit: Payer: Self-pay

## 2018-11-18 ENCOUNTER — Ambulatory Visit (HOSPITAL_COMMUNITY)
Admission: EM | Admit: 2018-11-18 | Discharge: 2018-11-18 | Disposition: A | Discharge status: Home / Self Care | Payer: Medicare Other | Attending: Family Medicine | Admitting: Family Medicine

## 2018-11-18 DIAGNOSIS — R358 Other polyuria: Secondary | ICD-10-CM | POA: Diagnosis not present

## 2018-11-18 DIAGNOSIS — S39012A Strain of muscle, fascia and tendon of lower back, initial encounter: Secondary | ICD-10-CM

## 2018-11-18 DIAGNOSIS — R0981 Nasal congestion: Secondary | ICD-10-CM

## 2018-11-18 LAB — POCT URINALYSIS DIP (DEVICE)
Bilirubin Urine: NEGATIVE
Glucose, UA: NEGATIVE mg/dL
Ketones, ur: 15 mg/dL — AB
Leukocytes,Ua: NEGATIVE
Nitrite: NEGATIVE
Protein, ur: NEGATIVE mg/dL
Specific Gravity, Urine: >=1.03 (ref 1.005–1.030)
Urobilinogen, UA: 0.2 mg/dL (ref 0.0–1.0)
pH: 5.5 (ref 5.0–8.0)

## 2018-11-18 MED ORDER — FEXOFENADINE HCL 180 MG PO TABS: 180.0000 mg | ORAL_TABLET | Freq: Every day | ORAL | 1 refills | Status: DC

## 2018-11-18 MED ORDER — METHYLPREDNISOLONE 4 MG PO TABS: 4.0000 mg | ORAL_TABLET | Freq: Every day | ORAL | 1 refills | Status: DC

## 2018-11-18 NOTE — ED Triage Notes (Signed)
C/O left low back pain since "working around the house a lot" the past few days.  Denies radiation of pain or parasthesias. Also c/o polyuria, and concerned about BPs running 099-833 range for systolic.

## 2018-11-18 NOTE — ED Provider Notes (Addendum)
[place]    CSN: [id number] Arrival date & time: 11/18/18  1216     History   Chief Complaint Chief Complaint  Patient presents with  . Back Pain  . Hypertension  . Polyuria    HPI [name] is a 78 y.o. female.   Established [place] patient  C/O left low back pain since "working around the house a lot" the past few days.  Denies radiation of pain or parasthesias. Also c/o polyuria, and concerned about BPs running 408-144 range for systolic.     Past Medical History:  Diagnosis Date  . Allergy   . Arthritis   . Carotid artery occlusion   . GERD (gastroesophageal reflux disease)   . Hypertension   . Joint pain   . Thyroid disease     Patient Active Problem List   Diagnosis Date Noted  . Essential hypertension 06/06/2017  . Hyperlipidemia 06/06/2017  . Leg cramps 06/06/2017  . Vertigo 06/06/2017  . Macular degeneration 06/06/2017  . Hypothyroidism 06/06/2017  . Carotid stenosis 01/22/2014  . Aftercare following surgery of the circulatory system, [place] 01/02/2013  . Occlusion and stenosis of carotid artery without mention of cerebral infarction 04/27/2011    Past Surgical History:  Procedure Laterality Date  . ABDOMINAL HYSTERECTOMY    . AORTIC ARCH ANGIOGRAPHY N/A 03/01/2017   Procedure: AORTIC ARCH ANGIOGRAPHY;  Surgeon: Serafina [name], MD;  Location: [place] [facility] LAB;  Service: Cardiovascular;  Laterality: N/A;  . ARCH AORTOGRAM N/A 05/04/2011   Procedure: ARCH AORTOGRAM;  Surgeon: Serafina [name], MD;  Location: [facility] CATH LAB;  Service: Cardiovascular;  Laterality: N/A;  . ARCH AORTOGRAM N/A 07/04/2012   Procedure: ARCH AORTOGRAM;  Surgeon: Serafina [name], MD;  Location: [facility] CATH LAB;  Service: Cardiovascular;  Laterality: N/A;  . CAROTID ANGIOGRAM  July 04, 2012  . CAROTID ANGIOGRAM N/A 05/04/2011   Procedure: CAROTID ANGIOGRAM;  Surgeon: Serafina [name], MD;  Location: [facility] CATH LAB;  Service: Cardiovascular;  Laterality: N/A;  . CAROTID  ANGIOGRAM N/A 07/04/2012   Procedure: CAROTID ANGIOGRAM;  Surgeon: Serafina [name], MD;  Location: [facility] CATH LAB;  Service: Cardiovascular;  Laterality: N/A;  . CAROTID STENT INSERTION Right 05/11/2011   Procedure: CAROTID STENT INSERTION;  Surgeon: Serafina [name], MD;  Location: [facility] CATH LAB;  Service: Cardiovascular;  Laterality: Right;  . carotid subclavian anastomosis  05/11/11   Right subclav. artery transected 12/17/09  . CATARACT EXTRACTION     bilateral  . EYE SURGERY      OB History   No obstetric history on file.      Home Medications    Prior to Admission medications   Medication Sig Start Date End Date Taking? Authorizing Provider  amLODipine (NORVASC) 5 MG tablet TAKE 1/2 TABLET BY MOUTH EVERY DAY 10/27/18  Yes [name], MD  aspirin 81 MG chewable tablet Chew 81 mg by mouth daily.   Yes [provider]  calcium-vitamin D (OSCAL WITH D) 500-200 MG-UNIT per tablet Take 1 tablet by mouth daily with breakfast.   Yes [provider]  clopidogrel (PLAVIX) 75 MG tablet TAKE 1 TABLET BY MOUTH EVERY DAY 10/05/18  Yes [name], MD  diazepam (VALIUM) 2 MG tablet Take 0.5 tablets (1 mg total) by mouth every 12 (twelve) hours as needed (vertigo). 04/20/18  Yes [name], MD  fluticasone (FLONASE) 50 MCG/ACT nasal spray Place 1 spray into both nostrils daily as needed for allergies or  rhinitis (for sinus congestion). 10/23/18  Yes [name], MD  gabapentin (NEURONTIN) 100 MG capsule TAKE 1 CAPSULE (100 MG TOTAL) BY MOUTH AT BEDTIME. 05/25/18  Yes [name], MD  latanoprost (XALATAN) 0.005 % ophthalmic solution Place 1 drop at bedtime into both eyes. 01/15/17  Yes [provider]  levothyroxine (SYNTHROID) 50 MCG tablet TAKE 1 TABLET BY MOUTH EVERY DAY BEFORE BREAKFAST 10/25/18  Yes [name], MD  metoprolol succinate (TOPROL-XL) 25 MG 24 hr tablet TAKE 1 TABLET BY MOUTH EVERY DAY 10/24/18  Yes [name], MD   Multiple Vitamins-Minerals (ALIVE WOMENS ENERGY) TABS Take 1 tablet by mouth daily.   Yes [provider]  Probiotic Product (PROBIOTIC PO) Take 1 capsule by mouth daily.   Yes [provider]  simvastatin (ZOCOR) 20 MG tablet TAKE 1 TABLET BY MOUTH EVERYDAY AT BEDTIME 09/25/18  Yes [name], MD  acetaminophen (TYLENOL) 325 MG tablet Take 650 mg every 6 (six) hours as needed by mouth for mild pain or headache.     [provider]  cetirizine (ZYRTEC) 10 MG tablet Take 10 mg daily as needed by mouth for allergies.     [provider]  diclofenac sodium (VOLTAREN) 1 % GEL Apply 2 g topically 4 (four) times daily. 09/07/17   [name], MD  docusate sodium (COLACE) 100 MG capsule Take 1 capsule (100 mg total) by mouth daily as needed for mild constipation. 10/23/18   [name], MD  lisinopril (ZESTRIL) 20 MG tablet Take 1 tablet (20 mg total) by mouth daily. 10/23/18   [name], MD  methylPREDNISolone (MEDROL) 4 MG tablet Take 1 tablet (4 mg total) by mouth daily. 11/18/18   Robyn Haber, MD    Family History Family History  Problem Relation Age of Onset  . Heart failure Mother   . Cancer Father     Social History Social History   Tobacco Use  . Smoking status: Former Smoker    Packs/day: 0.30    Years: 5.00    Pack years: 1.50    Types: Cigarettes    Quit date: 04/12/1980    Years since quitting: 38.6  . Smokeless tobacco: Never Used  Substance Use Topics  . Alcohol use: No  . Drug use: No     Allergies   Meclizine   Review of Systems Review of Systems  Constitutional: Negative for activity change, appetite change, chills, diaphoresis, fatigue and fever.  HENT: Positive for congestion.   Respiratory: Negative for cough.   Cardiovascular: Negative.   Gastrointestinal: Negative.   Genitourinary: Positive for flank pain and frequency.  Musculoskeletal: Positive for back pain.  Neurological: Negative for  numbness and headaches.  All other systems reviewed and are negative.    Physical Exam Triage Vital Signs ED Triage Vitals  Enc Vitals Group     BP 11/18/18 1302 (!) 149/53     Pulse Rate 11/18/18 1302 69     Resp 11/18/18 1302 18     Temp 11/18/18 1302 98.1 F (36.7 C)     Temp Source 11/18/18 1302 Oral     SpO2 11/18/18 1302 95 %     Weight --      Height --      Head Circumference --      Peak Flow --      Pain Score 11/18/18 1304 0     Pain Loc --      Pain Edu? --  Excl. in GC? --    No data found.  Updated Vital Signs BP (!) 149/53   Pulse 69   Temp 98.1 F (36.7 C) (Oral)   Resp 18   SpO2 95%    Physical Exam Vitals signs and nursing note reviewed.  Constitutional:      Appearance: Normal appearance. She is normal weight.  HENT:     Head: Normocephalic.     Mouth/Throat:     Pharynx: Oropharynx is clear.  Eyes:     Conjunctiva/sclera: Conjunctivae normal.  Neck:     Musculoskeletal: Normal range of motion and neck supple.  Cardiovascular:     Rate and Rhythm: Normal rate and regular rhythm.  Pulmonary:     Effort: Pulmonary effort is normal.  Abdominal:     Tenderness: There is no abdominal tenderness.  Musculoskeletal: Normal range of motion.     Comments: nontender flank  Skin:    General: Skin is warm and dry.  Neurological:     General: No focal deficit present.     Mental Status: She is alert and oriented to person, place, and time.     Gait: Gait abnormal.  Psychiatric:        Mood and Affect: Mood normal.        Behavior: Behavior normal.      UC Treatments / Results  Labs (all labs ordered are listed, but only abnormal results are displayed) Labs Reviewed  POCT URINALYSIS DIP (DEVICE) - Abnormal; Notable for the following components:      Result Value   Ketones, ur 15 (*)    Hgb urine dipstick TRACE (*)    All other components within normal limits    EKG   Radiology No results found.  Procedures Procedures  (including critical care time)  Medications Ordered in UC Medications - No data to display  Initial Impression / Assessment and Plan / UC Course  I have reviewed the triage vital signs and the nursing notes.  Pertinent labs & imaging results that were available during my care of the patient were reviewed by me and considered in my medical decision making (see chart for details).    Final Clinical Impressions(s) / UC Diagnoses   Final diagnoses:  Strain of lumbar region, initial encounter   Discharge Instructions   None    ED Prescriptions    Medication Sig Dispense Auth. Provider   methylPREDNISolone (MEDROL) 4 MG tablet Take 1 tablet (4 mg total) by mouth daily. 5 tablet Robyn Haber, MD     Controlled Substance Prescriptions [place] Controlled Substance Registry consulted? Not Applicable   Robyn Haber, MD 11/18/18 1331    Robyn Haber, MD 11/18/18 [phone]

## 2018-12-02 ENCOUNTER — Other Ambulatory Visit: Payer: Self-pay | Admitting: Internal Medicine

## 2018-12-02 DIAGNOSIS — G500 Trigeminal neuralgia: Secondary | ICD-10-CM

## 2018-12-13 ENCOUNTER — Telehealth: Payer: Self-pay | Admitting: *Deleted

## 2018-12-13 NOTE — Telephone Encounter (Signed)
Medical Assistant left message on patient's home and cell voicemail. Voicemail states to give a call back to [place] with [facility] at [phone]. !!!Please schedule a Nurse or CPP appointment for vaccines!!!

## 2018-12-22 ENCOUNTER — Other Ambulatory Visit: Payer: Self-pay | Admitting: Internal Medicine

## 2018-12-27 ENCOUNTER — Other Ambulatory Visit: Payer: Self-pay | Admitting: Internal Medicine

## 2019-01-03 ENCOUNTER — Ambulatory Visit
Admission: RE | Admit: 2019-01-03 | Discharge: 2019-01-03 | Disposition: A | Discharge status: Home / Self Care | Payer: Medicare Other | Source: Ambulatory Visit | Attending: Internal Medicine | Admitting: Internal Medicine

## 2019-01-03 ENCOUNTER — Other Ambulatory Visit: Payer: Self-pay

## 2019-01-03 DIAGNOSIS — Z1231 Encounter for screening mammogram for malignant neoplasm of breast: Secondary | ICD-10-CM

## 2019-01-04 ENCOUNTER — Telehealth: Payer: Self-pay | Admitting: *Deleted

## 2019-01-04 NOTE — Telephone Encounter (Signed)
-----  Message from [name], MD sent at 01/04/2019  8:18 AM EDT ----- Let pt know that her MMG came back okay.

## 2019-01-04 NOTE — Telephone Encounter (Signed)
Patient verified DOB Patient is aware of mammogram being okay. Patient had no further questions.

## 2019-01-15 ENCOUNTER — Other Ambulatory Visit: Payer: Self-pay | Admitting: Internal Medicine

## 2019-01-15 DIAGNOSIS — R0981 Nasal congestion: Secondary | ICD-10-CM

## 2019-01-15 DIAGNOSIS — I10 Essential (primary) hypertension: Secondary | ICD-10-CM

## 2019-01-20 ENCOUNTER — Other Ambulatory Visit: Payer: Self-pay | Admitting: Internal Medicine

## 2019-01-20 DIAGNOSIS — I10 Essential (primary) hypertension: Secondary | ICD-10-CM

## 2019-01-20 DIAGNOSIS — E039 Hypothyroidism, unspecified: Secondary | ICD-10-CM

## 2019-02-07 ENCOUNTER — Other Ambulatory Visit: Payer: Self-pay | Admitting: Internal Medicine

## 2019-02-07 DIAGNOSIS — R42 Dizziness and giddiness: Secondary | ICD-10-CM

## 2019-02-15 ENCOUNTER — Other Ambulatory Visit: Payer: Self-pay | Admitting: Internal Medicine

## 2019-02-15 DIAGNOSIS — R42 Dizziness and giddiness: Secondary | ICD-10-CM

## 2019-02-16 ENCOUNTER — Other Ambulatory Visit: Payer: Self-pay | Admitting: Internal Medicine

## 2019-02-16 DIAGNOSIS — E039 Hypothyroidism, unspecified: Secondary | ICD-10-CM

## 2019-02-16 DIAGNOSIS — I10 Essential (primary) hypertension: Secondary | ICD-10-CM

## 2019-03-01 ENCOUNTER — Ambulatory Visit: Payer: Medicare Other | Attending: Internal Medicine | Admitting: Physician Assistant

## 2019-03-01 ENCOUNTER — Other Ambulatory Visit: Payer: Self-pay

## 2019-03-01 DIAGNOSIS — E039 Hypothyroidism, unspecified: Secondary | ICD-10-CM

## 2019-03-01 DIAGNOSIS — I6529 Occlusion and stenosis of unspecified carotid artery: Secondary | ICD-10-CM | POA: Diagnosis not present

## 2019-03-01 DIAGNOSIS — E785 Hyperlipidemia, unspecified: Secondary | ICD-10-CM | POA: Diagnosis not present

## 2019-03-01 DIAGNOSIS — I10 Essential (primary) hypertension: Secondary | ICD-10-CM | POA: Diagnosis not present

## 2019-03-01 MED ORDER — SIMVASTATIN 20 MG PO TABS: ORAL_TABLET | ORAL | 1 refills | Status: DC

## 2019-03-01 MED ORDER — CLOPIDOGREL BISULFATE 75 MG PO TABS: 75.0000 mg | ORAL_TABLET | Freq: Every day | ORAL | 1 refills | Status: DC

## 2019-03-01 MED ORDER — METOPROLOL SUCCINATE ER 25 MG PO TB24: 25.0000 mg | ORAL_TABLET | Freq: Every day | ORAL | 1 refills | Status: DC

## 2019-03-01 MED ORDER — AMLODIPINE BESYLATE 5 MG PO TABS: 2.5000 mg | ORAL_TABLET | Freq: Every day | ORAL | 3 refills | Status: DC

## 2019-03-01 MED ORDER — LISINOPRIL 20 MG PO TABS: 20.0000 mg | ORAL_TABLET | Freq: Every day | ORAL | 1 refills | Status: DC

## 2019-03-01 MED ORDER — LEVOTHYROXINE SODIUM 50 MCG PO TABS: ORAL_TABLET | ORAL | 4 refills | Status: DC

## 2019-03-01 NOTE — Progress Notes (Signed)
Patient ID: [name], female   DOB: 01-09-1941, 78 y.o.   MRN: [id number] Virtual Visit via Telephone Note  I connected with [name] on 03/01/19 at  4:10 PM EST by telephone and verified that I am speaking with the correct person using two identifiers.   I discussed the limitations, risks, security and privacy concerns of performing an evaluation and management service by telephone and the availability of in person appointments. I also discussed with the patient that there may be a patient responsible charge related to this service. The patient expressed understanding and agreed to proceed.  Patient location: My Location:  [place] office Persons on the call:   History of Present Illness: Systolic BP this morning was 150 then 126 this afternoon, diastolic was 70 then 60.  BP usu ranging around 120s to 130s/60s-70s.  No HA/CP/change in vision.  She is not symptomatic of any problems.Taking meds as directed.  She needs RF on meds.   Some eye swelling in the mornings.  She thinks it is allergies or her thyroid acting up.     Observations/Objective:  NAD.  A&Ox3   Assessment and Plan: 1. Essential hypertension Some fluctuations normal;  Continue to check home BP and continue current regimen - amLODipine (NORVASC) 5 MG tablet; Take 0.5 tablets (2.5 mg total) by mouth daily.  Dispense: 15 tablet; Refill: 3 - lisinopril (ZESTRIL) 20 MG tablet; Take 1 tablet (20 mg total) by mouth daily.  Dispense: 90 tablet; Refill: 1 - metoprolol succinate (TOPROL-XL) 25 MG 24 hr tablet; Take 1 tablet (25 mg total) by mouth daily.  Dispense: 90 tablet; Refill: 1  2. Hypothyroidism, unspecified type - Thyroid Panel With TSH; Future - levothyroxine (SYNTHROID) 50 MCG tablet; 1 daily  Dispense: 30 tablet; Refill: 4  3. Stenosis of carotid artery, unspecified laterality - clopidogrel (PLAVIX) 75 MG tablet; Take 1 tablet (75 mg total) by mouth daily.  Dispense: 90 tablet; Refill: 1  4. Hyperlipidemia,  unspecified hyperlipidemia type - simvastatin (ZOCOR) 20 MG tablet; TAKE 1 TABLET BY MOUTH EVERYDAY AT BEDTIME  Dispense: 90 tablet; Refill: 1    Follow Up Instructions: See PCP in 2 months;  Sooner if needed   I discussed the assessment and treatment plan with the patient. The patient was provided an opportunity to ask questions and all were answered. The patient agreed with the plan and demonstrated an understanding of the instructions.   The patient was advised to call back or seek an in-person evaluation if the symptoms worsen or if the condition fails to improve as anticipated.  I provided 9 minutes of non-face-to-face time during this encounter.   Freeman Caldron, PA-C

## 2019-03-01 NOTE — Progress Notes (Signed)
Patient has been called and DOB has been verified. Patient has been screened and transferred to PCP to start phone visit.   Patient states that blood pressure has been elevated for 2 weeks.  Patient states that her face and eye swells at times.

## 2019-03-02 ENCOUNTER — Ambulatory Visit: Payer: Medicare Other | Attending: Family Medicine

## 2019-03-02 ENCOUNTER — Other Ambulatory Visit: Payer: Self-pay

## 2019-03-02 DIAGNOSIS — E039 Hypothyroidism, unspecified: Secondary | ICD-10-CM | POA: Diagnosis not present

## 2019-03-03 LAB — THYROID PANEL WITH TSH
Free Thyroxine Index: 2.1 (ref 1.2–4.9)
T3 Uptake Ratio: 25 % (ref 24–39)
T4, Total: 8.5 ug/dL (ref 4.5–12.0)
TSH: 6.69 u[IU]/mL — ABNORMAL HIGH (ref 0.450–4.500)

## 2019-03-04 ENCOUNTER — Other Ambulatory Visit: Payer: Self-pay | Admitting: Internal Medicine

## 2019-03-04 DIAGNOSIS — R42 Dizziness and giddiness: Secondary | ICD-10-CM

## 2019-03-05 ENCOUNTER — Other Ambulatory Visit: Payer: Self-pay | Admitting: Physician Assistant

## 2019-03-05 ENCOUNTER — Other Ambulatory Visit: Payer: Self-pay | Admitting: *Deleted

## 2019-03-05 DIAGNOSIS — R42 Dizziness and giddiness: Secondary | ICD-10-CM

## 2019-03-05 DIAGNOSIS — E039 Hypothyroidism, unspecified: Secondary | ICD-10-CM

## 2019-03-05 MED ORDER — DIAZEPAM 2 MG PO TABS: 1.0000 mg | ORAL_TABLET | Freq: Two times a day (BID) | ORAL | 1 refills | Status: DC | PRN

## 2019-03-05 MED ORDER — LEVOTHYROXINE SODIUM 50 MCG PO TABS: 75.0000 ug | ORAL_TABLET | Freq: Every day | ORAL | 4 refills | Status: DC

## 2019-03-05 NOTE — Telephone Encounter (Signed)
Patient request refill on Diazepam 0.5 mg

## 2019-03-07 ENCOUNTER — Other Ambulatory Visit: Payer: Self-pay

## 2019-03-15 ENCOUNTER — Other Ambulatory Visit: Payer: Self-pay | Admitting: Internal Medicine

## 2019-03-15 DIAGNOSIS — R42 Dizziness and giddiness: Secondary | ICD-10-CM

## 2019-04-04 ENCOUNTER — Telehealth: Payer: Self-pay | Admitting: Internal Medicine

## 2019-04-04 DIAGNOSIS — R42 Dizziness and giddiness: Secondary | ICD-10-CM

## 2019-04-04 MED ORDER — DIAZEPAM 2 MG PO TABS: 1.0000 mg | ORAL_TABLET | Freq: Two times a day (BID) | ORAL | 1 refills | Status: DC | PRN

## 2019-04-04 NOTE — Telephone Encounter (Signed)
1) Medication(s) Requested (by name): -diazepam (VALIUM) 2 MG tablet   2) Pharmacy of Choice: -[id number]- [place], [place] - [address]

## 2019-04-04 NOTE — Telephone Encounter (Signed)
Contacted pt and made aware of rx being refilled. Pt doesn't have any questions or concerns

## 2019-04-13 ENCOUNTER — Other Ambulatory Visit: Payer: Self-pay | Admitting: Internal Medicine

## 2019-04-13 DIAGNOSIS — R0981 Nasal congestion: Secondary | ICD-10-CM

## 2019-04-24 ENCOUNTER — Telehealth: Payer: Self-pay | Admitting: Internal Medicine

## 2019-05-01 ENCOUNTER — Ambulatory Visit: Payer: Medicare Other | Admitting: Internal Medicine

## 2019-05-03 ENCOUNTER — Encounter: Payer: Self-pay | Admitting: Internal Medicine

## 2019-05-03 ENCOUNTER — Ambulatory Visit: Payer: Medicare Other | Attending: Internal Medicine | Admitting: Internal Medicine

## 2019-05-03 ENCOUNTER — Other Ambulatory Visit: Payer: Self-pay

## 2019-05-03 VITALS — BP 150/60 | HR 84 | Wt 133.0 lb

## 2019-05-03 DIAGNOSIS — Z7982 Long term (current) use of aspirin: Secondary | ICD-10-CM | POA: Diagnosis not present

## 2019-05-03 DIAGNOSIS — Z8249 Family history of ischemic heart disease and other diseases of the circulatory system: Secondary | ICD-10-CM | POA: Diagnosis not present

## 2019-05-03 DIAGNOSIS — Z7952 Long term (current) use of systemic steroids: Secondary | ICD-10-CM | POA: Diagnosis not present

## 2019-05-03 DIAGNOSIS — Z2821 Immunization not carried out because of patient refusal: Secondary | ICD-10-CM

## 2019-05-03 DIAGNOSIS — Z7901 Long term (current) use of anticoagulants: Secondary | ICD-10-CM | POA: Insufficient documentation

## 2019-05-03 DIAGNOSIS — I739 Peripheral vascular disease, unspecified: Secondary | ICD-10-CM | POA: Insufficient documentation

## 2019-05-03 DIAGNOSIS — E785 Hyperlipidemia, unspecified: Secondary | ICD-10-CM | POA: Diagnosis not present

## 2019-05-03 DIAGNOSIS — J3489 Other specified disorders of nose and nasal sinuses: Secondary | ICD-10-CM | POA: Diagnosis not present

## 2019-05-03 DIAGNOSIS — Z79899 Other long term (current) drug therapy: Secondary | ICD-10-CM | POA: Diagnosis not present

## 2019-05-03 DIAGNOSIS — R05 Cough: Secondary | ICD-10-CM | POA: Insufficient documentation

## 2019-05-03 DIAGNOSIS — I10 Essential (primary) hypertension: Secondary | ICD-10-CM | POA: Insufficient documentation

## 2019-05-03 DIAGNOSIS — Z87891 Personal history of nicotine dependence: Secondary | ICD-10-CM | POA: Insufficient documentation

## 2019-05-03 DIAGNOSIS — E039 Hypothyroidism, unspecified: Secondary | ICD-10-CM | POA: Diagnosis not present

## 2019-05-03 MED ORDER — AMLODIPINE BESYLATE 5 MG PO TABS: 5.0000 mg | ORAL_TABLET | Freq: Every day | ORAL | 3 refills | Status: DC

## 2019-05-03 NOTE — Progress Notes (Signed)
Patient ID: [name], female    DOB: [birth year]  MRN: [id number]  CC: Hypertension   Subjective: [name] is a 79 y.o. female who presents for chronic ds management.  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   HM: declines flu and pneumonia vaccines.  Does not want COVID vaccine  C/o little running nose.  Occurs when weather is cool or damp.  Sensitive to strong odors.  Zyrtec and Flonase help.  Uses Flonase 2x/day.  No epistaxis Little cough sometimes at nights, no fever.   No SOB  HYPERTENSION Currently taking: see medication list.   Med Adherence: _0  Yes - Norvasc and Lsinopril    _1  No Medication side effects: _2  Yes    _3  No Adherence with salt restriction: _4  Yes    _5  No Home Monitoring?: _6  Yes    _7  No Monitoring Frequency: _8  Yes    _9  No Home BP results range: 138-140s/60s SOB? _10  Yes    _11  No Chest Pain?: _12  Yes    _13  No Leg swelling?: _14  Yes    _15  No Headaches?: _16  Yes    _17  No Dizziness? _18  Yes    _19  No recently Comments:   Hypothyroid: TSH was checked back in November when she had a visit with our PA.  Levothyroxine was increased from 50 mcg daily to 75 mcg daily as TSH was not at goal.  Plan is to recheck level today.  She reports some hair loss when her thyroid level is off.  Good appetite.   Moving bowels and urine okay. Still independent in her ADLs.  She has not had any falls since last visit.  Patient Active Problem List   Diagnosis Date Noted  . Essential hypertension 06/06/2017  . Hyperlipidemia 06/06/2017  . Leg cramps 06/06/2017  . Vertigo 06/06/2017  . Macular degeneration 06/06/2017  . Hypothyroidism 06/06/2017  . Carotid stenosis 01/22/2014  . Aftercare following surgery of the  circulatory system, [place] 01/02/2013  . Occlusion and stenosis of carotid artery without mention of cerebral infarction 04/27/2011     Current Outpatient Medications on File Prior to Visit  Medication Sig Dispense Refill  . acetaminophen (TYLENOL) 325 MG tablet Take 650 mg every 6 (six) hours as needed by mouth for mild pain or headache.     Marland Kitchen aspirin 81 MG chewable tablet Chew 81 mg by mouth daily.    . calcium-vitamin D (OSCAL WITH D) 500-200 MG-UNIT per tablet Take 1 tablet by mouth daily with breakfast.    . cetirizine (ZYRTEC) 10 MG tablet Take 10 mg daily as needed by mouth for allergies.     Marland Kitchen clopidogrel (PLAVIX) 75 MG tablet Take 1 tablet (75 mg total) by mouth daily. 90 tablet 1  . diazepam (VALIUM) 2 MG tablet Take 0.5 tablets (1 mg total) by mouth every 12 (twelve) hours as needed (vertigo). 15 tablet 1  . diclofenac sodium (VOLTAREN) 1 % GEL Apply 2 g topically 4 (four) times daily. 2 g 0  . docusate sodium (COLACE) 100 MG capsule Take 1 capsule (100 mg total) by mouth daily as needed for mild constipation. 30 capsule 2  . fluticasone (FLONASE) 50 MCG/ACT nasal spray PLACE 1 SPRAY INTO BOTH NOSTRILS DAILY AS NEEDED FOR ALLERGIES OR RHINITIS (FOR SINUS CONGESTION). 48 mL 0  .  gabapentin (NEURONTIN) 100 MG capsule TAKE 1 CAPSULE (100 MG TOTAL) BY MOUTH AT BEDTIME. 90 capsule 1  . latanoprost (XALATAN) 0.005 % ophthalmic solution Place 1 drop at bedtime into both eyes.    Marland Kitchen levothyroxine (SYNTHROID) 50 MCG tablet Take 1.5 tablets (75 mcg total) by mouth daily before breakfast. 1 daily 45 tablet 4  . lisinopril (ZESTRIL) 20 MG tablet Take 1 tablet (20 mg total) by mouth daily. 90 tablet 1  . methylPREDNISolone (MEDROL) 4 MG tablet Take 1 tablet (4 mg total) by mouth daily. 5 tablet 1  . metoprolol succinate (TOPROL-XL) 25 MG 24 hr tablet Take 1 tablet (25 mg total) by mouth daily. 90 tablet 1  . Multiple Vitamins-Minerals (ALIVE WOMENS ENERGY) TABS Take 1 tablet by mouth daily.    .  Probiotic Product (PROBIOTIC PO) Take 1 capsule by mouth daily.    . simvastatin (ZOCOR) 20 MG tablet TAKE 1 TABLET BY MOUTH EVERYDAY AT BEDTIME 90 tablet 1   No current facility-administered medications on file prior to visit.    Allergies  Allergen Reactions  . Meclizine Nausea And Vomiting    Social History   Socioeconomic History  . Marital status: Single    Spouse name: Not on file  . Number of children: Not on file  . Years of education: Not on file  . Highest education level: Not on file  Occupational History  . Not on file  Tobacco Use  . Smoking status: Former Smoker    Packs/day: 0.30    Years: 5.00    Pack years: 1.50    Types: Cigarettes    Quit date: 04/12/1980    Years since quitting: 39.0  . Smokeless tobacco: Never Used  Substance and Sexual Activity  . Alcohol use: No  . Drug use: No  . Sexual activity: Not on file  Other Topics Concern  . Not on file  Social History Narrative  . Not on file   Social Determinants of Health   Financial Resource Strain:   . Difficulty of Paying Living Expenses: Not on file  Food Insecurity:   . Worried About Charity fundraiser in the Last Year: Not on file  . Ran Out of Food in the Last Year: Not on file  Transportation Needs:   . Lack of Transportation (Medical): Not on file  . Lack of Transportation (Non-Medical): Not on file  Physical Activity:   . Days of Exercise per Week: Not on file  . Minutes of Exercise per Session: Not on file  Stress:   . Feeling of Stress : Not on file  Social Connections:   . Frequency of Communication with Friends and Family: Not on file  . Frequency of Social Gatherings with Friends and Family: Not on file  . Attends Religious Services: Not on file  . Active Member of Clubs or Organizations: Not on file  . Attends Archivist Meetings: Not on file  . Marital Status: Not on file  Intimate Partner Violence:   . Fear of Current or Ex-Partner: Not on file  . Emotionally  Abused: Not on file  . Physically Abused: Not on file  . Sexually Abused: Not on file    Family History  Problem Relation Age of Onset  . Heart failure Mother   . Cancer Father   . Breast cancer Neg Hx     Past Surgical History:  Procedure Laterality Date  . ABDOMINAL HYSTERECTOMY    . AORTIC ARCH ANGIOGRAPHY N/A  03/01/2017   Procedure: AORTIC ARCH ANGIOGRAPHY;  Surgeon: Serafina [name], MD;  Location: [place] [facility] LAB;  Service: Cardiovascular;  Laterality: N/A;  . ARCH AORTOGRAM N/A 05/04/2011   Procedure: ARCH AORTOGRAM;  Surgeon: Serafina [name], MD;  Location[facility] CATH LAB;  Service: Cardiovascular;  Laterality: N/A;  . ARCH AORTOGRAM N/A 07/04/2012   Procedure: ARCH AORTOGRAM;  Surgeon: Serafina [name], MD;  Location: [facility] CATH LAB;  Service: Cardiovascular;  Laterality: N/A;  . CAROTID ANGIOGRAM  July 04, 2012  . CAROTID ANGIOGRAM N/A 05/04/2011   Procedure: CAROTID ANGIOGRAM;  Surgeon: Serafina [name], MD;  Location: [facility] CATH LAB;  Service: Cardiovascular;  Laterality: N/A;  . CAROTID ANGIOGRAM N/A 07/04/2012   Procedure: CAROTID ANGIOGRAM;  Surgeon: Serafina [name], MD;  Location: [facility] CATH LAB;  Service: Cardiovascular;  Laterality: N/A;  . CAROTID STENT INSERTION Right 05/11/2011   Procedure: CAROTID STENT INSERTION;  Surgeon: Serafina [name], MD;  Location: [facility] CATH LAB;  Service: Cardiovascular;  Laterality: Right;  . carotid subclavian anastomosis  05/11/11   Right subclav. artery transected 12/17/09  . CATARACT EXTRACTION     bilateral  . EYE SURGERY      ROS: Review of Systems Negative except as stated above  PHYSICAL EXAM: BP (!) 150/60   Pulse 84   Wt 133 lb (60.3 kg)   SpO2 91%   BMI 24.33 kg/m   Wt Readings from Last 3 Encounters:  05/03/19 133 lb (60.3 kg)  03/17/18 137 lb (62.1 kg)  11/07/17 131 lb 3.2 oz (59.5 kg)    Physical Exam  General appearance - alert, well appearing, elderly and in no distress Mental status - normal mood, behavior, speech,  dress, motor activity, and thought processes Nose -nasal mucosa is dry.  No enlargement of nasal turbinates.   Mouth - mucous membranes moist, pharynx normal without lesions Neck -carotid bruit on right side.  No thyromegaly.  No thyroid nodules. Chest - clear to auscultation, no wheezes, rales or rhonchi, symmetric air entry Heart - normal rate, regular rhythm, normal S1, S2, no murmurs, rubs, clicks or gallops Extremities -no lower extremity edema.  Radial pulse 2 on the right and 3+ on the left.  CMP Latest Ref Rng & Units 10/24/2018 11/07/2017 06/06/2017  Glucose 65 - 99 mg/dL 84 93 90  BUN 8 - 27 mg/dL _0 Creatinine 0.57 - 1.00 mg/dL 0.92 0.89 0.83  Sodium 134 - 144 mmol/L 143 142 143  Potassium 3.5 - 5.2 mmol/L 4.5 4.0 4.1  Chloride 96 - 106 mmol/L 105 107(H) 107(H)  CO2 20 - 29 mmol/L 20 18(L) 21  Calcium 8.7 - 10.3 mg/dL 9.4 9.6 9.8  Total Protein 6.0 - 8.5 g/dL 8.0 7.5 8.3  Total Bilirubin 0.0 - 1.2 mg/dL 0.3 0.3 0.4  Alkaline Phos 39 - 117 IU/L 77 85 81  AST 0 - 40 IU/L 29 26 47(H)  ALT 0 - 32 IU/L _1 Lipid Panel     Component Value Date/Time   CHOL 147 10/24/2018 0852   TRIG 83 10/24/2018 0852   HDL 63 10/24/2018 0852   CHOLHDL 2.3 10/24/2018 0852   CHOLHDL 3.4 04/17/2008 2240   VLDL 11 04/17/2008 2240   LDLCALC 67 10/24/2018 0852    CBC    Component Value Date/Time   WBC 9.8 10/24/2018 0852   WBC 8.5 09/23/2015 0021   RBC 4.14 10/24/2018 0852   RBC 3.70 (L) 09/23/2015 0021  HGB 12.6 10/24/2018 0852   HCT 40.1 10/24/2018 0852   PLT 253 10/24/2018 0852   MCV 97 10/24/2018 0852   MCH 30.4 10/24/2018 0852   MCH 30.0 09/23/2015 0021   MCHC 31.4 (L) 10/24/2018 0852   MCHC 31.6 09/23/2015 0021   RDW 13.4 10/24/2018 0852   LYMPHSABS 4.2 (H) 12/03/2009 1123   MONOABS 0.4 12/03/2009 1123   EOSABS 0.1 12/03/2009 1123   BASOSABS 0.1 12/03/2009 1123    ASSESSMENT AND PLAN:  1. Essential hypertension Not at goal.  Increase amlodipine to 5 mg  daily.  Continue to monitor blood pressure. - amLODipine (NORVASC) 5 MG tablet; Take 1 tablet (5 mg total) by mouth daily.  Dispense: 30 tablet; Refill: 3  2. Hypothyroidism, unspecified type Continue levothyroxine. - TSH  3. Influenza vaccination declined This was offered and patient declined  4. Pneumococcal vaccination declined by patient This was offered and patient declined  5. Rhinorrhea -Even though patient reports rhinorrhea nasal mucosa is actually very dry.  I recommend that she continue the Zyrtec and Flonase as needed   Patient was given the opportunity to ask questions.  Patient verbalized understanding of the plan and was able to repeat key elements of the plan.   Orders Placed This Encounter  Procedures  . TSH     Requested Prescriptions   Signed Prescriptions Disp Refills  . amLODipine (NORVASC) 5 MG tablet 30 tablet 3    Sig: Take 1 tablet (5 mg total) by mouth daily.    Return in about 4 months (around 08/31/2019).  Karle Plumber, MD, FACP

## 2019-05-03 NOTE — Progress Notes (Signed)
Cant breath at times.

## 2019-05-03 NOTE — Patient Instructions (Signed)
Your blood pressure is not at goal.  I recommend increasing the amlodipine to 5 mg 1 tablet daily.  Continue to monitor your blood pressure.  The goal is 130/80 or lower.

## 2019-05-04 LAB — TSH: TSH: 0.17 u[IU]/mL — ABNORMAL LOW (ref 0.450–4.500)

## 2019-05-06 ENCOUNTER — Telehealth: Payer: Self-pay | Admitting: Internal Medicine

## 2019-05-06 DIAGNOSIS — E039 Hypothyroidism, unspecified: Secondary | ICD-10-CM

## 2019-05-06 MED ORDER — LEVOTHYROXINE SODIUM 50 MCG PO TABS: 50.0000 ug | ORAL_TABLET | Freq: Every day | ORAL | 4 refills | Status: DC

## 2019-05-07 ENCOUNTER — Telehealth: Payer: Self-pay | Admitting: Internal Medicine

## 2019-05-07 NOTE — Telephone Encounter (Signed)
Tried contacting pt to go over Dr. Wynetta Emery message pt didn't answer. Lvm asking pt to give a call back at her earliest convenience

## 2019-05-07 NOTE — Telephone Encounter (Signed)
tient called and requested for lab results. Patient was given lab results and verbalized understanding. Patient had no further questions and Appointment for thyroid recheck has been made for March 8th.

## 2019-05-08 ENCOUNTER — Other Ambulatory Visit: Payer: Self-pay | Admitting: Physician Assistant

## 2019-05-08 DIAGNOSIS — E039 Hypothyroidism, unspecified: Secondary | ICD-10-CM

## 2019-05-10 ENCOUNTER — Telehealth: Payer: Self-pay

## 2019-05-10 NOTE — Telephone Encounter (Signed)
PA for Diazepam 64m denied thru ins. 04/12/2019

## 2019-05-13 NOTE — Telephone Encounter (Signed)
PC placed to pt today to make sure she understood message concerning lab results.  Pt states she received the results of the TSH and my recommendation to hold Levothyroxine for 1 wk then restart at the lower dose of 50 mcg.  She tells me that the dose was lowered from 75 mcg to 50 mcg and she has picked up the 50 mcg already.  I reminded her to return to lab after she has been on the 50 mcg for 6 wks for recheck TSH.

## 2019-05-13 NOTE — Telephone Encounter (Signed)
Pt advised she was approved for the Valium on recent office visit.

## 2019-05-24 ENCOUNTER — Other Ambulatory Visit: Payer: Self-pay | Admitting: Physician Assistant

## 2019-05-24 ENCOUNTER — Other Ambulatory Visit: Payer: Self-pay | Admitting: Internal Medicine

## 2019-05-24 DIAGNOSIS — I10 Essential (primary) hypertension: Secondary | ICD-10-CM

## 2019-05-24 DIAGNOSIS — G500 Trigeminal neuralgia: Secondary | ICD-10-CM

## 2019-06-05 ENCOUNTER — Other Ambulatory Visit: Payer: Self-pay | Admitting: Internal Medicine

## 2019-06-05 DIAGNOSIS — K5900 Constipation, unspecified: Secondary | ICD-10-CM

## 2019-06-18 ENCOUNTER — Other Ambulatory Visit: Payer: Self-pay

## 2019-06-18 ENCOUNTER — Ambulatory Visit: Payer: Medicare Other | Attending: Family Medicine

## 2019-06-18 DIAGNOSIS — E039 Hypothyroidism, unspecified: Secondary | ICD-10-CM | POA: Diagnosis not present

## 2019-06-19 LAB — TSH: TSH: 3.64 u[IU]/mL (ref 0.450–4.500)

## 2019-06-21 ENCOUNTER — Telehealth: Payer: Self-pay

## 2019-06-21 NOTE — Telephone Encounter (Signed)
Contacted pt to go over lab results pt is aware and doesn't have any questions or concerns

## 2019-07-09 ENCOUNTER — Other Ambulatory Visit: Payer: Self-pay | Admitting: Internal Medicine

## 2019-07-09 DIAGNOSIS — R0981 Nasal congestion: Secondary | ICD-10-CM

## 2019-07-29 ENCOUNTER — Other Ambulatory Visit: Payer: Self-pay | Admitting: Internal Medicine

## 2019-07-29 DIAGNOSIS — I10 Essential (primary) hypertension: Secondary | ICD-10-CM

## 2019-08-30 ENCOUNTER — Other Ambulatory Visit: Payer: Self-pay | Admitting: Physician Assistant

## 2019-08-30 DIAGNOSIS — I10 Essential (primary) hypertension: Secondary | ICD-10-CM

## 2019-09-06 ENCOUNTER — Ambulatory Visit: Payer: Medicare Other | Attending: Internal Medicine | Admitting: Internal Medicine

## 2019-09-06 ENCOUNTER — Other Ambulatory Visit: Payer: Self-pay

## 2019-09-06 ENCOUNTER — Encounter: Payer: Self-pay | Admitting: Internal Medicine

## 2019-09-06 VITALS — BP 100/50 | Ht 62.0 in

## 2019-09-06 DIAGNOSIS — E039 Hypothyroidism, unspecified: Secondary | ICD-10-CM | POA: Diagnosis not present

## 2019-09-06 DIAGNOSIS — I10 Essential (primary) hypertension: Secondary | ICD-10-CM

## 2019-09-06 DIAGNOSIS — E785 Hyperlipidemia, unspecified: Secondary | ICD-10-CM | POA: Diagnosis not present

## 2019-09-06 MED ORDER — METOPROLOL SUCCINATE ER 25 MG PO TB24: 12.5000 mg | ORAL_TABLET | Freq: Every day | ORAL | 6 refills | Status: DC

## 2019-09-06 NOTE — Progress Notes (Signed)
Virtual Visit via Telephone Note Due to current restrictions/limitations of in-office visits due to the COVID-19 pandemic, this scheduled clinical appointment was converted to a telehealth visit  I connected with [name] on 09/06/19 at 8:57 a.m by telephone and verified that I am speaking with the correct person using two identifiers. I am in my office.  The patient is at home.  Only the patient and myself participated in this encounter.  I discussed the limitations, risks, security and privacy concerns of performing an evaluation and management service by telephone and the availability of in person appointments. I also discussed with the patient that there may be a patient responsible charge related to this service. The patient expressed understanding and agreed to proceed.   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 Last seen 04/2019.  Purpose of today's visit is chronic disease management.  HTN:  BP has been good but going down.  This a.m 100/50.  Other readings 105-114/50-60. Device does not check pulse.  No HA, dizziness, CP, SOB Walking a mile 3 days a wk and does some light wgh lifting Good appetite and sleeping well.  Thyroid:  Compliant with Levothyroxine.  No feeling of being hot or cold all the time.  HL:  Taking and tolerating Zocor.  HM:  Declines COVID vaccine.  Outpatient Encounter Medications as of 09/06/2019  Medication Sig  . metoprolol succinate (TOPROL-XL) 25 MG 24 hr tablet TAKE 1 TABLET BY MOUTH EVERY DAY  . acetaminophen (TYLENOL) 325 MG tablet Take 650 mg every 6 (six) hours as needed by mouth for mild pain or headache.   Marland Kitchen amLODipine (NORVASC) 5 MG tablet TAKE 1 TABLET BY MOUTH EVERY DAY  . aspirin 81 MG chewable tablet Chew 81 mg by mouth  daily.  . calcium-vitamin D (OSCAL WITH D) 500-200 MG-UNIT per tablet Take 1 tablet by mouth daily with breakfast.  . cetirizine (ZYRTEC) 10 MG tablet Take 10 mg daily as needed by mouth for allergies.   Marland Kitchen clopidogrel (PLAVIX) 75 MG tablet Take 1 tablet (75 mg total) by mouth daily.  . [employer] STOOL SOFTENER 100 MG capsule TAKE 1 CAPSULE (100 MG TOTAL) BY MOUTH DAILY AS NEEDED FOR MILD CONSTIPATION.  . diazepam (VALIUM) 2 MG tablet Take 0.5 tablets (1 mg total) by mouth every 12 (twelve) hours as needed (vertigo).  Marland Kitchen diclofenac sodium (VOLTAREN) 1 % GEL Apply 2 g topically 4 (four) times daily.  . fluticasone (FLONASE) 50 MCG/ACT nasal spray PLACE 1 SPRAY INTO BOTH NOSTRILS DAILY AS NEEDED FOR ALLERGIES OR RHINITIS (FOR SINUS CONGESTION).  Marland Kitchen gabapentin (NEURONTIN) 100 MG capsule TAKE 1 CAPSULE (100 MG TOTAL) BY MOUTH AT BEDTIME.  Marland Kitchen latanoprost (XALATAN) 0.005 % ophthalmic solution Place 1 drop at bedtime into both eyes.  Marland Kitchen levothyroxine (SYNTHROID) 50 MCG tablet TAKE 1 TABLET BY MOUTH EVERY DAY  . lisinopril (ZESTRIL) 20 MG tablet Take 1 tablet (20 mg total) by mouth daily.  . methylPREDNISolone (MEDROL) 4 MG tablet Take 1 tablet (4 mg total) by mouth daily.  . Multiple Vitamins-Minerals (ALIVE WOMENS ENERGY) TABS Take 1 tablet by mouth daily.  . Probiotic Product (PROBIOTIC PO) Take 1 capsule by mouth daily.  . simvastatin (ZOCOR) 20 MG tablet TAKE 1 TABLET BY MOUTH EVERYDAY AT BEDTIME   No facility-administered encounter medications on file as of 09/06/2019.  Observations/Objective: Results for orders placed or performed in visit on 06/18/19  TSH  Result Value Ref Range   TSH 3.640 0.450 - 4.500 uIU/mL   Assessment and Plan: 1. Essential hypertension Patient is worried that the blood pressure is a little on the low side.  I have told her to cut metoprolol in half so she will take 25 mg half a tablet daily.  Continue amlodipine and lisinopril. Continue healthy eating habits and regular  exercise - CBC; Future - Comprehensive metabolic panel; Future  2. Hyperlipidemia, unspecified hyperlipidemia type Continue Zocor - Lipid panel; Future  3. Hypothyroidism, acquired Continue levothyroxine   Follow Up Instructions: 4 mths   I discussed the assessment and treatment plan with the patient. The patient was provided an opportunity to ask questions and all were answered. The patient agreed with the plan and demonstrated an understanding of the instructions.   The patient was advised to call back or seek an in-person evaluation if the symptoms worsen or if the condition fails to improve as anticipated.  I provided 9 minutes of non-face-to-face time during this encounter.   Karle Plumber, MD

## 2019-09-11 DIAGNOSIS — E785 Hyperlipidemia, unspecified: Secondary | ICD-10-CM | POA: Diagnosis not present

## 2019-09-11 DIAGNOSIS — I10 Essential (primary) hypertension: Secondary | ICD-10-CM | POA: Diagnosis not present

## 2019-09-11 NOTE — Addendum Note (Signed)
Addended bySteffanie Dunn on: 09/11/2019 08:49 AM   Modules accepted: Orders

## 2019-09-12 ENCOUNTER — Telehealth: Payer: Self-pay

## 2019-09-12 LAB — COMPREHENSIVE METABOLIC PANEL
ALT: 10 IU/L (ref 0–32)
AST: 20 IU/L (ref 0–40)
Albumin/Globulin Ratio: 1.4 (ref 1.2–2.2)
Albumin: 4.5 g/dL (ref 3.7–4.7)
Alkaline Phosphatase: 89 IU/L (ref 48–121)
BUN/Creatinine Ratio: 10 — ABNORMAL LOW (ref 12–28)
BUN: 9 mg/dL (ref 8–27)
Bilirubin Total: 0.4 mg/dL (ref 0.0–1.2)
CO2: 19 mmol/L — ABNORMAL LOW (ref 20–29)
Calcium: 9.5 mg/dL (ref 8.7–10.3)
Chloride: 111 mmol/L — ABNORMAL HIGH (ref 96–106)
Creatinine, Ser: 0.86 mg/dL (ref 0.57–1.00)
GFR calc Af Amer: 74 mL/min/{1.73_m2} (ref >59)
GFR calc non Af Amer: 64 mL/min/{1.73_m2} (ref >59)
Globulin, Total: 3.3 g/dL (ref 1.5–4.5)
Glucose: 80 mg/dL (ref 65–99)
Potassium: 4 mmol/L (ref 3.5–5.2)
Sodium: 144 mmol/L (ref 134–144)
Total Protein: 7.8 g/dL (ref 6.0–8.5)

## 2019-09-12 LAB — LIPID PANEL
Chol/HDL Ratio: 3 ratio (ref 0.0–4.4)
Cholesterol, Total: 145 mg/dL (ref 100–199)
HDL: 49 mg/dL (ref >39)
LDL Chol Calc (NIH): 74 mg/dL (ref 0–99)
Triglycerides: 125 mg/dL (ref 0–149)
VLDL Cholesterol Cal: 22 mg/dL (ref 5–40)

## 2019-09-12 LAB — CBC
Hematocrit: 40 % (ref 34.0–46.6)
Hemoglobin: 12.5 g/dL (ref 11.1–15.9)
MCH: 29.8 pg (ref 26.6–33.0)
MCHC: 31.3 g/dL — ABNORMAL LOW (ref 31.5–35.7)
MCV: 95 fL (ref 79–97)
Platelets: 247 10*3/uL (ref 150–450)
RBC: 4.2 x10E6/uL (ref 3.77–5.28)
RDW: 14.1 % (ref 11.7–15.4)
WBC: 9.7 10*3/uL (ref 3.4–10.8)

## 2019-09-12 NOTE — Telephone Encounter (Signed)
Contacted pt to go over lab results pt is aware and doesn't have any questions or concerns

## 2019-09-12 NOTE — Progress Notes (Signed)
Let patient know that her cholesterol level is normal.  Kidney and liver function tests are okay.  Blood count is normal meaning no anemia.

## 2019-09-23 ENCOUNTER — Other Ambulatory Visit: Payer: Self-pay | Admitting: Internal Medicine

## 2019-09-23 DIAGNOSIS — I10 Essential (primary) hypertension: Secondary | ICD-10-CM

## 2019-10-03 ENCOUNTER — Other Ambulatory Visit: Payer: Self-pay | Admitting: Internal Medicine

## 2019-10-03 DIAGNOSIS — R0981 Nasal congestion: Secondary | ICD-10-CM

## 2019-10-04 ENCOUNTER — Other Ambulatory Visit: Payer: Self-pay | Admitting: Physician Assistant

## 2019-10-04 DIAGNOSIS — I6529 Occlusion and stenosis of unspecified carotid artery: Secondary | ICD-10-CM

## 2019-10-11 ENCOUNTER — Ambulatory Visit (HOSPITAL_COMMUNITY)
Admission: EM | Admit: 2019-10-11 | Discharge: 2019-10-11 | Disposition: A | Discharge status: Home / Self Care | Payer: Medicare Other | Attending: Internal Medicine | Admitting: Internal Medicine

## 2019-10-11 ENCOUNTER — Encounter (HOSPITAL_COMMUNITY): Payer: Self-pay

## 2019-10-11 DIAGNOSIS — J019 Acute sinusitis, unspecified: Secondary | ICD-10-CM | POA: Insufficient documentation

## 2019-10-11 DIAGNOSIS — Z20822 Contact with and (suspected) exposure to covid-19: Secondary | ICD-10-CM | POA: Diagnosis not present

## 2019-10-11 LAB — SARS CORONAVIRUS 2 (TAT 6-24 HRS): SARS Coronavirus 2: NEGATIVE

## 2019-10-11 MED ORDER — AMOXICILLIN-POT CLAVULANATE 875-125 MG PO TABS
1.0000 | ORAL_TABLET | Freq: Two times a day (BID) | ORAL | 0 refills | Status: AC
Start: 2019-10-11 — End: 2019-10-18

## 2019-10-11 NOTE — Discharge Instructions (Signed)
Continue daily cetirizine and Flonase nasal spray Add in Augmentin twice daily for 1 week Rest and drink plenty of fluids  Follow-up if not improving or worsening

## 2019-10-11 NOTE — ED Provider Notes (Signed)
[facility]    CSN: [id number] Arrival date & time: 10/11/19  0859      History   Chief Complaint Chief Complaint  Patient presents with  . Nasal Congestion  . Shortness of Breath    HPI [name] is a 79 y.o. female history of hypertension, GERD, carotid stenosis, presenting today for evaluation of cough and congestion.  Patient reports over the past 2 weeks she has had congestion and cough.  She is also had associated body aches.  Symptoms are mainly in her sinuses and reports some drainage.  Has been using Flonase along with occasional cetirizine.  Denies GI symptoms.  Denies fevers.  Denies any close sick contacts.  Reports sensitivity to perfumes that may worsen symptoms.  HPI  Past Medical History:  Diagnosis Date  . Allergy   . Arthritis   . Carotid artery occlusion   . GERD (gastroesophageal reflux disease)   . Hypertension   . Joint pain   . Thyroid disease     Patient Active Problem List   Diagnosis Date Noted  . Essential hypertension 06/06/2017  . Hyperlipidemia 06/06/2017  . Leg cramps 06/06/2017  . Vertigo 06/06/2017  . Macular degeneration 06/06/2017  . Hypothyroidism 06/06/2017  . Carotid stenosis 01/22/2014  . Aftercare following surgery of the circulatory system, [facility] 01/02/2013  . Occlusion and stenosis of carotid artery without mention of cerebral infarction 04/27/2011    Past Surgical History:  Procedure Laterality Date  . ABDOMINAL HYSTERECTOMY    . AORTIC ARCH ANGIOGRAPHY N/A 03/01/2017   Procedure: AORTIC ARCH ANGIOGRAPHY;  Surgeon: Serafina [name], MD;  Location: [facility];  Service: Cardiovascular;  Laterality: N/A;  . ARCH AORTOGRAM N/A 05/04/2011   Procedure: ARCH AORTOGRAM;  Surgeon: Serafina [name], MD;  Location: [facility] CATH LAB;  Service: Cardiovascular;  Laterality: N/A;  . ARCH AORTOGRAM N/A 07/04/2012   Procedure: ARCH AORTOGRAM;  Surgeon: Serafina [name], MD;  Location: [facility] CATH LAB;  Service: Cardiovascular;   Laterality: N/A;  . CAROTID ANGIOGRAM  July 04, 2012  . CAROTID ANGIOGRAM N/A 05/04/2011   Procedure: CAROTID ANGIOGRAM;  Surgeon: Serafina [name], MD;  Location: [facility] CATH LAB;  Service: Cardiovascular;  Laterality: N/A;  . CAROTID ANGIOGRAM N/A 07/04/2012   Procedure: CAROTID ANGIOGRAM;  Surgeon: Serafina [name], MD;  Location: [facility] CATH LAB;  Service: Cardiovascular;  Laterality: N/A;  . CAROTID STENT INSERTION Right 05/11/2011   Procedure: CAROTID STENT INSERTION;  Surgeon: Serafina [name], MD;  Location: [facility] CATH LAB;  Service: Cardiovascular;  Laterality: Right;  . carotid subclavian anastomosis  05/11/11   Right subclav. artery transected 12/17/09  . CATARACT EXTRACTION     bilateral  . EYE SURGERY      OB History   No obstetric history on file.      Home Medications    Prior to Admission medications   Medication Sig Start Date End Date Taking? Authorizing Provider  acetaminophen (TYLENOL) 325 MG tablet Take 650 mg every 6 (six) hours as needed by mouth for mild pain or headache.    Yes [provider]  amLODipine (NORVASC) 5 MG tablet TAKE 1 TABLET BY MOUTH EVERY DAY 07/30/19  Yes [name], MD  cetirizine (ZYRTEC) 10 MG tablet Take 10 mg daily as needed by mouth for allergies.    Yes [provider]  clopidogrel (PLAVIX) 75 MG tablet TAKE 1 TABLET BY MOUTH EVERY DAY 10/06/19  Yes [name], MD  [employer]S STOOL SOFTENER 100 MG capsule TAKE 1 CAPSULE (100 MG TOTAL) BY MOUTH DAILY AS NEEDED FOR MILD CONSTIPATION. 06/05/19  Yes [name], MD  diazepam (VALIUM) 2 MG tablet Take 0.5 tablets (1 mg total) by mouth every 12 (twelve) hours as needed (vertigo). 04/04/19  Yes [name], MD  fluticasone (FLONASE) 50 MCG/ACT nasal spray PLACE 1 SPRAY INTO BOTH NOSTRILS DAILY AS NEEDED FOR ALLERGIES OR RHINITIS (FOR SINUS CONGESTION). 10/03/19  Yes [name], MD  gabapentin (NEURONTIN) 100 MG capsule TAKE 1 CAPSULE (100 MG TOTAL) BY MOUTH AT  BEDTIME. 05/30/19  Yes [name], MD  levothyroxine (SYNTHROID) 50 MCG tablet TAKE 1 TABLET BY MOUTH EVERY DAY 05/08/19  Yes [name], MD  lisinopril (ZESTRIL) 20 MG tablet Take 1 tablet (20 mg total) by mouth daily. 03/01/19  Yes Freeman Caldron M, PA-C  metoprolol succinate (TOPROL-XL) 25 MG 24 hr tablet TAKE 1 TABLET BY MOUTH EVERY DAY 09/24/19  Yes [name], MD  simvastatin (ZOCOR) 20 MG tablet TAKE 1 TABLET BY MOUTH EVERYDAY AT BEDTIME 03/01/19  Yes McClung, Angela M, PA-C  amoxicillin-clavulanate (AUGMENTIN) 875-125 MG tablet Take 1 tablet by mouth every 12 (twelve) hours for 7 days. 10/11/19 10/18/19  Savian Mazon C, PA-C  aspirin 81 MG chewable tablet Chew 81 mg by mouth daily.    [provider]  calcium-vitamin D (OSCAL WITH D) 500-200 MG-UNIT per tablet Take 1 tablet by mouth daily with breakfast.    [provider]  diclofenac sodium (VOLTAREN) 1 % GEL Apply 2 g topically 4 (four) times daily. 09/07/17   [name], MD  latanoprost (XALATAN) 0.005 % ophthalmic solution Place 1 drop at bedtime into both eyes. 01/15/17   [provider]  methylPREDNISolone (MEDROL) 4 MG tablet Take 1 tablet (4 mg total) by mouth daily. 11/18/18   Robyn Haber, MD  Multiple Vitamins-Minerals (ALIVE WOMENS ENERGY) TABS Take 1 tablet by mouth daily.    [provider]  Probiotic Product (PROBIOTIC PO) Take 1 capsule by mouth daily.    [provider]    Family History Family History  Problem Relation Age of Onset  . Heart failure Mother   . Cancer Father   . Breast cancer Neg Hx     Social History Social History   Tobacco Use  . Smoking status: Former Smoker    Packs/day: 0.30    Years: 5.00    Pack years: 1.50    Types: Cigarettes    Quit date: 04/12/1980    Years since quitting: 39.5  . Smokeless tobacco: Never Used  Vaping Use  . Vaping Use: Never used  Substance Use Topics  . Alcohol use: No  . Drug use: No       Allergies   Meclizine   Review of Systems Review of Systems  Constitutional: Negative for activity change, appetite change, chills, fatigue and fever.  HENT: Positive for congestion, rhinorrhea and sinus pressure. Negative for ear pain, sore throat and trouble swallowing.   Eyes: Negative for discharge and redness.  Respiratory: Positive for cough. Negative for chest tightness and shortness of breath.   Cardiovascular: Negative for chest pain.  Gastrointestinal: Negative for abdominal pain, diarrhea, nausea and vomiting.  Musculoskeletal: Negative for myalgias.  Skin: Negative for rash.  Neurological: Negative for dizziness, light-headedness and headaches.     Physical Exam Triage Vital Signs ED Triage Vitals  Enc Vitals Group     BP 10/11/19 0929 Marland Kitchen)  144/58     Pulse Rate 10/11/19 0929 87     Resp 10/11/19 0929 20     Temp 10/11/19 0929 98.1 F (36.7 C)     Temp Source 10/11/19 0929 Oral     SpO2 10/11/19 0929 98 %     Weight --      Height --      Head Circumference --      Peak Flow --      Pain Score 10/11/19 0923 0     Pain Loc --      Pain Edu? --      Excl. in [place]? --    No data found.  Updated Vital Signs BP (!) 144/58 (BP Location: Right Arm)   Pulse 87   Temp 98.1 F (36.7 C) (Oral)   Resp 20   SpO2 98%   Visual Acuity Right Eye Distance:   Left Eye Distance:   Bilateral Distance:    Right Eye Near:   Left Eye Near:    Bilateral Near:     Physical Exam Vitals and nursing note reviewed.  Constitutional:      Appearance: She is well-developed.     Comments: No acute distress  HENT:     Head: Normocephalic and atraumatic.     Ears:     Comments: Bilateral ears without tenderness to palpation of external auricle, tragus and mastoid, EAC's without erythema or swelling, TM's with good bony landmarks and cone of light. Non erythematous.     Nose: Nose normal.     Comments: Nasal mucosa mildly erythematous, swollen turbinates noted  superiorly within nares bilaterally    Mouth/Throat:     Comments: Oral mucosa pink and moist, no tonsillar enlargement or exudate. Posterior pharynx patent and nonerythematous, no uvula deviation or swelling. Normal phonation. Eyes:     Conjunctiva/sclera: Conjunctivae normal.  Cardiovascular:     Rate and Rhythm: Normal rate.  Pulmonary:     Effort: Pulmonary effort is normal. No respiratory distress.     Comments: Lungs - Normal respiratory effort, chest expands symmetrically. Lungs are clear to auscultation, no crackles or wheezes.  Abdominal:     General: There is no distension.  Musculoskeletal:        General: Normal range of motion.     Cervical back: Neck supple.  Skin:    General: Skin is warm and dry.  Neurological:     Mental Status: She is alert and oriented to person, place, and time.      UC Treatments / Results  Labs (all labs ordered are listed, but only abnormal results are displayed) Labs Reviewed  SARS CORONAVIRUS 2 (TAT 6-24 HRS)    EKG   Radiology No results found.  Procedures Procedures (including critical care time)  Medications Ordered in UC Medications - No data to display  Initial Impression / Assessment and Plan / UC Course  I have reviewed the triage vital signs and the nursing notes.  Pertinent labs & imaging results that were available during my care of the patient were reviewed by me and considered in my medical decision making (see chart for details).     Covid test pending.  URI symptoms x2 weeks, treating for sinusitis, adding in Augmentin, may continue cetirizine and Flonase.  Rest and fluids.  Lungs clear, minimal reported lower respiratory symptoms.  Discussed strict return precautions. Patient verbalized understanding and is agreeable with plan.  Final Clinical Impressions(s) / UC Diagnoses   Final diagnoses:  Acute sinusitis with symptoms > 10 days     Discharge Instructions     Continue daily cetirizine and  Flonase nasal spray Add in Augmentin twice daily for 1 week Rest and drink plenty of fluids  Follow-up if not improving or worsening   ED Prescriptions    Medication Sig Dispense Auth. Provider   amoxicillin-clavulanate (AUGMENTIN) 875-125 MG tablet Take 1 tablet by mouth every 12 (twelve) hours for 7 days. 14 tablet Sanaiya Welliver, [place] C, PA-C     PDMP not reviewed this encounter.   Janith Lima, [place] 10/11/19 1202

## 2019-10-11 NOTE — ED Triage Notes (Signed)
Pt c/o nasal congestion, non-productive cough, body aches for approx 2 weeks. Also reports recent [place] when doing household chores. Denies sore throat, HA, n/v/d, abdom pain, fever, chills.  Has been taking flonase for symptoms.

## 2019-10-25 ENCOUNTER — Other Ambulatory Visit: Payer: Self-pay | Admitting: Internal Medicine

## 2019-10-25 DIAGNOSIS — I10 Essential (primary) hypertension: Secondary | ICD-10-CM

## 2019-10-30 DIAGNOSIS — H04123 Dry eye syndrome of bilateral lacrimal glands: Secondary | ICD-10-CM | POA: Diagnosis not present

## 2019-10-30 DIAGNOSIS — Z961 Presence of intraocular lens: Secondary | ICD-10-CM | POA: Diagnosis not present

## 2019-10-30 DIAGNOSIS — E0500 Thyrotoxicosis with diffuse goiter without thyrotoxic crisis or storm: Secondary | ICD-10-CM | POA: Diagnosis not present

## 2019-10-30 DIAGNOSIS — H401131 Primary open-angle glaucoma, bilateral, mild stage: Secondary | ICD-10-CM | POA: Diagnosis not present

## 2019-10-30 DIAGNOSIS — H05243 Constant exophthalmos, bilateral: Secondary | ICD-10-CM | POA: Diagnosis not present

## 2019-11-14 DIAGNOSIS — M170 Bilateral primary osteoarthritis of knee: Secondary | ICD-10-CM | POA: Diagnosis not present

## 2019-11-23 ENCOUNTER — Other Ambulatory Visit: Payer: Self-pay | Admitting: Physician Assistant

## 2019-11-23 DIAGNOSIS — E785 Hyperlipidemia, unspecified: Secondary | ICD-10-CM

## 2019-11-25 ENCOUNTER — Encounter (HOSPITAL_COMMUNITY): Payer: Self-pay

## 2019-11-25 ENCOUNTER — Other Ambulatory Visit: Payer: Self-pay

## 2019-11-25 ENCOUNTER — Ambulatory Visit (HOSPITAL_COMMUNITY)
Admission: EM | Admit: 2019-11-25 | Discharge: 2019-11-25 | Disposition: A | Discharge status: Home / Self Care | Payer: Medicare Other | Attending: Physician Assistant | Admitting: Physician Assistant

## 2019-11-25 DIAGNOSIS — R358 Other polyuria: Secondary | ICD-10-CM | POA: Diagnosis not present

## 2019-11-25 DIAGNOSIS — R0781 Pleurodynia: Secondary | ICD-10-CM | POA: Diagnosis not present

## 2019-11-25 DIAGNOSIS — N393 Stress incontinence (female) (male): Secondary | ICD-10-CM | POA: Insufficient documentation

## 2019-11-25 LAB — POCT URINALYSIS DIPSTICK, ED / UC
Bilirubin Urine: NEGATIVE
Glucose, UA: NEGATIVE mg/dL
Hgb urine dipstick: NEGATIVE
Ketones, ur: NEGATIVE mg/dL
Leukocytes,Ua: NEGATIVE
Nitrite: NEGATIVE
Protein, ur: NEGATIVE mg/dL
Specific Gravity, Urine: 1.015 (ref 1.005–1.030)
Urobilinogen, UA: 0.2 mg/dL (ref 0.0–1.0)
pH: 6.5 (ref 5.0–8.0)

## 2019-11-25 MED ORDER — DICLOFENAC SODIUM 1 % EX GEL: 2.0000 g | Freq: Four times a day (QID) | CUTANEOUS | 0 refills | Status: DC

## 2019-11-25 NOTE — ED Triage Notes (Signed)
Patient is today with her daughter, [name], with complaints of dysuria, polyuria and upper right sided abdominal pain that began about three weeks ago. Patient states she thinks she pulled a muscle from doing stuff around the house.  Patient denies: fever, nausea, diarrhea, vomiting

## 2019-11-25 NOTE — Discharge Instructions (Signed)
We sent urine culture and call you if we need to change any treatments  Try the gel in the areas of pain on your ribs  If you develop severe pain, with shortness of breath or vomiting go to the hospital  Follow-up with your primary care tomorrow to discuss your urinary symptoms.  I believe this is stress incontinence.

## 2019-11-25 NOTE — ED Provider Notes (Signed)
[place]    CSN: [id number] Arrival date & time: 11/25/19  1014      History   Chief Complaint Chief Complaint  Patient presents with  . Polyuria    HPI DEVINNE EPSTEIN is a 79 y.o. female.   Patient presents for evaluation of right-sided rib pain as well as frequent urination.  She reports frequent urinations been going on for the last 3 weeks or so.  She reports this as " dribbling" and this is primarily occurs when trying to move objects or lift something.  She reports normal urination outside of these episodes.  She reports occasionally having accidents.  Denies painful urination or urgency.  She denies any vaginal irritation.  She denies any fevers, nausea or vomiting.  With regard to her right sided pain believe this started after moving a mattress.  She reports this in the lower ribs.  She reports this hurts with certain movements.  She points directly to her right lower ribs.  Denies pain with breathing.  Denies pain in her belly.  Denies change with food.  Pain is not constant and it comes and goes.  Denies any chest pain or shortness of breath.  Denies any rashes.     Past Medical History:  Diagnosis Date  . Allergy   . Arthritis   . Carotid artery occlusion   . GERD (gastroesophageal reflux disease)   . Hypertension   . Joint pain   . Thyroid disease     Patient Active Problem List   Diagnosis Date Noted  . Essential hypertension 06/06/2017  . Hyperlipidemia 06/06/2017  . Leg cramps 06/06/2017  . Vertigo 06/06/2017  . Macular degeneration 06/06/2017  . Hypothyroidism 06/06/2017  . Carotid stenosis 01/22/2014  . Aftercare following surgery of the circulatory system, [place] 01/02/2013  . Occlusion and stenosis of carotid artery without mention of cerebral infarction 04/27/2011    Past Surgical History:  Procedure Laterality Date  . ABDOMINAL HYSTERECTOMY    . AORTIC ARCH ANGIOGRAPHY N/A 03/01/2017   Procedure: AORTIC ARCH ANGIOGRAPHY;  Surgeon:  Serafina [name], MD;  Location: [place] [facility];  Service: Cardiovascular;  Laterality: N/A;  . ARCH AORTOGRAM N/A 05/04/2011   Procedure: ARCH AORTOGRAM;  Surgeon: Serafina [name], MD;  Location: [facility] CATH LAB;  Service: Cardiovascular;  Laterality: N/A;  . ARCH AORTOGRAM N/A 07/04/2012   Procedure: ARCH AORTOGRAM;  Surgeon: Serafina [name], MD;  Location: [facility] CATH LAB;  Service: Cardiovascular;  Laterality: N/A;  . CAROTID ANGIOGRAM  July 04, 2012  . CAROTID ANGIOGRAM N/A 05/04/2011   Procedure: CAROTID ANGIOGRAM;  Surgeon: Serafina [name], MD;  Location: [facility] CATH LAB;  Service: Cardiovascular;  Laterality: N/A;  . CAROTID ANGIOGRAM N/A 07/04/2012   Procedure: CAROTID ANGIOGRAM;  Surgeon: Serafina [name], MD;  Location: [facility] CATH LAB;  Service: Cardiovascular;  Laterality: N/A;  . CAROTID STENT INSERTION Right 05/11/2011   Procedure: CAROTID STENT INSERTION;  Surgeon: Serafina [name], MD;  Location: [facility] CATH LAB;  Service: Cardiovascular;  Laterality: Right;  . carotid subclavian anastomosis  05/11/11   Right subclav. artery transected 12/17/09  . CATARACT EXTRACTION     bilateral  . EYE SURGERY      OB History   No obstetric history on file.      Home Medications    Prior to Admission medications   Medication Sig Start Date End Date Taking? Authorizing Provider  acetaminophen (TYLENOL) 325 MG tablet Take 650 mg every 6 (six)  hours as needed by mouth for mild pain or headache.     [provider]  amLODipine (NORVASC) 5 MG tablet TAKE 1 TABLET BY MOUTH EVERY DAY 10/25/19   [name], MD  aspirin 81 MG chewable tablet Chew 81 mg by mouth daily.    [provider]  calcium-vitamin D (OSCAL WITH D) 500-200 MG-UNIT per tablet Take 1 tablet by mouth daily with breakfast.    [provider]  cetirizine (ZYRTEC) 10 MG tablet Take 10 mg daily as needed by mouth for allergies.     [provider]  clopidogrel (PLAVIX) 75 MG tablet TAKE 1 TABLET BY MOUTH  EVERY DAY 10/06/19   [name], MD  [employer] STOOL SOFTENER 100 MG capsule TAKE 1 CAPSULE (100 MG TOTAL) BY MOUTH DAILY AS NEEDED FOR MILD CONSTIPATION. 06/05/19   [name], MD  diazepam (VALIUM) 2 MG tablet Take 0.5 tablets (1 mg total) by mouth every 12 (twelve) hours as needed (vertigo). 04/04/19   [name], MD  diclofenac Sodium (VOLTAREN) 1 % GEL Apply 2 g topically 4 (four) times daily. 11/25/19   Arnie Maiolo, Marguerita Beards, PA-C  fluticasone (FLONASE) 50 MCG/ACT nasal spray PLACE 1 SPRAY INTO BOTH NOSTRILS DAILY AS NEEDED FOR ALLERGIES OR RHINITIS (FOR SINUS CONGESTION). 10/03/19   [name], MD  gabapentin (NEURONTIN) 100 MG capsule TAKE 1 CAPSULE (100 MG TOTAL) BY MOUTH AT BEDTIME. 05/30/19   [name], MD  latanoprost (XALATAN) 0.005 % ophthalmic solution Place 1 drop at bedtime into both eyes. 01/15/17   [provider]  levothyroxine (SYNTHROID) 50 MCG tablet TAKE 1 TABLET BY MOUTH EVERY DAY 05/08/19   [name], MD  lisinopril (ZESTRIL) 20 MG tablet Take 1 tablet (20 mg total) by mouth daily. 03/01/19   Argentina Donovan, PA-C  methylPREDNISolone (MEDROL) 4 MG tablet Take 1 tablet (4 mg total) by mouth daily. 11/18/18   Robyn Haber, MD  metoprolol succinate (TOPROL-XL) 25 MG 24 hr tablet TAKE 1 TABLET BY MOUTH EVERY DAY 09/24/19   [name], MD  Multiple Vitamins-Minerals (ALIVE WOMENS ENERGY) TABS Take 1 tablet by mouth daily.    [provider]  Probiotic Product (PROBIOTIC PO) Take 1 capsule by mouth daily.    [provider]  simvastatin (ZOCOR) 20 MG tablet TAKE 1 TABLET BY MOUTH EVERYDAY AT BEDTIME 11/23/19   [name], MD  SYSTANE ULTRA 0.4-0.3 % SOLN Place 1 drop into both eyes as directed. 10/30/19   [provider]    Family History Family History  Problem Relation Age of Onset  . Heart failure Mother   . Cancer Father   . Breast cancer Neg Hx     Social History Social History    Tobacco Use  . Smoking status: Former Smoker    Packs/day: 0.30    Years: 5.00    Pack years: 1.50    Types: Cigarettes    Quit date: 04/12/1980    Years since quitting: 39.6  . Smokeless tobacco: Never Used  Vaping Use  . Vaping Use: Never used  Substance Use Topics  . Alcohol use: No  . Drug use: No     Allergies   Meclizine   Review of Systems Review of Systems   Physical Exam Triage Vital Signs ED Triage Vitals  Enc Vitals Group     BP      Pulse      Resp  Temp      Temp src      SpO2      Weight      Height      Head Circumference      Peak Flow      Pain Score      Pain Loc      Pain Edu?      Excl. in [place]?    No data found.  Updated Vital Signs BP 107/78 (BP Location: Right Arm)   Pulse 82   Temp 98 F (36.7 C) (Oral)   Resp 16   SpO2 90%   Visual Acuity Right Eye Distance:   Left Eye Distance:   Bilateral Distance:    Right Eye Near:   Left Eye Near:    Bilateral Near:     Physical Exam Vitals and nursing note reviewed.  Constitutional:      General: She is not in acute distress.    Appearance: She is well-developed. She is not ill-appearing.  HENT:     Head: Normocephalic and atraumatic.     Mouth/Throat:     Mouth: Mucous membranes are moist.     Pharynx: Oropharynx is clear.  Eyes:     Conjunctiva/sclera: Conjunctivae normal.  Cardiovascular:     Rate and Rhythm: Normal rate and regular rhythm.     Heart sounds: No murmur heard.   Pulmonary:     Effort: Pulmonary effort is normal. No respiratory distress.     Breath sounds: Normal breath sounds. No wheezing, rhonchi or rales.     Comments: Tenderness over the right lower ribs and free-floating ribs.  No rash or deformity.   Living air well.  Speaking in full sentences.  Saturating at 95% on provider check. Abdominal:     Palpations: Abdomen is soft.     Tenderness: There is no abdominal tenderness. There is no right CVA tenderness or left CVA tenderness.   Musculoskeletal:     Cervical back: Neck supple.  Skin:    General: Skin is warm and dry.     Findings: No rash.  Neurological:     Mental Status: She is alert.      UC Treatments / Results  Labs (all labs ordered are listed, but only abnormal results are displayed) Labs Reviewed  URINE CULTURE  POCT URINALYSIS DIPSTICK, ED / UC    EKG   Radiology No results found.  Procedures Procedures (including critical care time)  Medications Ordered in UC Medications - No data to display  Initial Impression / Assessment and Plan / UC Course  I have reviewed the triage vital signs and the nursing notes.  Pertinent labs & imaging results that were available during my care of the patient were reviewed by me and considered in my medical decision making (see chart for details).     #Stress incontinence #Right-sided rib pain Patient is an 79 year old today with what appears to be a stress incontinence.  UA was completely normal here, will send culture.  Given lack of painful urination and frequency and reported symptoms doubt infection.  Recommended use of pads and taking it easy until she has follow-up.  Very reproducible right-sided pain in the ribs, no abdominal pain, feel this is musculoskeletal in nature.  Reassuring auscultation exam and she is well-appearing in clinic.  We will try topical Voltaren over the ribs.  We will have her follow-up with her primary care with return in emergency department precautions discussed.  Patient and patient's daughter  verbalized understanding of the plan. Final Clinical Impressions(s) / UC Diagnoses   Final diagnoses:  Stress incontinence  Rib pain on right side     Discharge Instructions     We sent urine culture and call you if we need to change any treatments  Try the gel in the areas of pain on your ribs  If you develop severe pain, with shortness of breath or vomiting go to the hospital  Follow-up with your primary care tomorrow  to discuss your urinary symptoms.  I believe this is stress incontinence.      ED Prescriptions    Medication Sig Dispense Auth. Provider   diclofenac Sodium (VOLTAREN) 1 % GEL Apply 2 g topically 4 (four) times daily. 100 g Kimberlea Schlag, Marguerita Beards, PA-C     PDMP not reviewed this encounter.   Purnell Shoemaker, PA-C 11/25/19 1412

## 2019-11-26 LAB — URINE CULTURE: Culture: <10000 — AB

## 2019-12-03 ENCOUNTER — Other Ambulatory Visit: Payer: Self-pay | Admitting: Physician Assistant

## 2019-12-03 DIAGNOSIS — I10 Essential (primary) hypertension: Secondary | ICD-10-CM

## 2019-12-25 ENCOUNTER — Emergency Department (HOSPITAL_COMMUNITY): Payer: Medicare Other

## 2019-12-25 ENCOUNTER — Inpatient Hospital Stay (HOSPITAL_COMMUNITY): Payer: Medicare Other

## 2019-12-25 ENCOUNTER — Encounter (HOSPITAL_COMMUNITY): Payer: Self-pay | Admitting: Emergency Medicine

## 2019-12-25 ENCOUNTER — Inpatient Hospital Stay (HOSPITAL_COMMUNITY)
Admission: EM | Admit: 2019-12-25 | Discharge: 2019-12-28 | DRG: 314 | Disposition: A | Discharge status: Home / Self Care | Payer: Medicare Other | Attending: Internal Medicine | Admitting: Internal Medicine

## 2019-12-25 ENCOUNTER — Other Ambulatory Visit: Payer: Self-pay

## 2019-12-25 ENCOUNTER — Ambulatory Visit (HOSPITAL_COMMUNITY)
Admission: EM | Admit: 2019-12-25 | Discharge: 2019-12-25 | Disposition: A | Discharge status: Home / Self Care | Payer: Medicare Other

## 2019-12-25 ENCOUNTER — Other Ambulatory Visit (HOSPITAL_COMMUNITY): Payer: Medicare Other

## 2019-12-25 DIAGNOSIS — J9690 Respiratory failure, unspecified, unspecified whether with hypoxia or hypercapnia: Secondary | ICD-10-CM | POA: Diagnosis not present

## 2019-12-25 DIAGNOSIS — K219 Gastro-esophageal reflux disease without esophagitis: Secondary | ICD-10-CM | POA: Diagnosis present

## 2019-12-25 DIAGNOSIS — I10 Essential (primary) hypertension: Secondary | ICD-10-CM | POA: Diagnosis present

## 2019-12-25 DIAGNOSIS — E785 Hyperlipidemia, unspecified: Secondary | ICD-10-CM | POA: Diagnosis present

## 2019-12-25 DIAGNOSIS — Z7982 Long term (current) use of aspirin: Secondary | ICD-10-CM | POA: Diagnosis not present

## 2019-12-25 DIAGNOSIS — Z87891 Personal history of nicotine dependence: Secondary | ICD-10-CM | POA: Diagnosis not present

## 2019-12-25 DIAGNOSIS — R918 Other nonspecific abnormal finding of lung field: Secondary | ICD-10-CM | POA: Diagnosis present

## 2019-12-25 DIAGNOSIS — J9621 Acute and chronic respiratory failure with hypoxia: Secondary | ICD-10-CM | POA: Diagnosis present

## 2019-12-25 DIAGNOSIS — J9601 Acute respiratory failure with hypoxia: Secondary | ICD-10-CM | POA: Diagnosis not present

## 2019-12-25 DIAGNOSIS — J189 Pneumonia, unspecified organism: Secondary | ICD-10-CM | POA: Diagnosis present

## 2019-12-25 DIAGNOSIS — Z8249 Family history of ischemic heart disease and other diseases of the circulatory system: Secondary | ICD-10-CM

## 2019-12-25 DIAGNOSIS — Z7902 Long term (current) use of antithrombotics/antiplatelets: Secondary | ICD-10-CM

## 2019-12-25 DIAGNOSIS — I272 Pulmonary hypertension, unspecified: Principal | ICD-10-CM | POA: Diagnosis present

## 2019-12-25 DIAGNOSIS — I361 Nonrheumatic tricuspid (valve) insufficiency: Secondary | ICD-10-CM | POA: Diagnosis not present

## 2019-12-25 DIAGNOSIS — M7989 Other specified soft tissue disorders: Secondary | ICD-10-CM | POA: Diagnosis not present

## 2019-12-25 DIAGNOSIS — I371 Nonrheumatic pulmonary valve insufficiency: Secondary | ICD-10-CM | POA: Diagnosis not present

## 2019-12-25 DIAGNOSIS — Z20822 Contact with and (suspected) exposure to covid-19: Secondary | ICD-10-CM | POA: Diagnosis present

## 2019-12-25 DIAGNOSIS — I2699 Other pulmonary embolism without acute cor pulmonale: Secondary | ICD-10-CM

## 2019-12-25 DIAGNOSIS — Z79899 Other long term (current) drug therapy: Secondary | ICD-10-CM | POA: Diagnosis not present

## 2019-12-25 DIAGNOSIS — J80 Acute respiratory distress syndrome: Secondary | ICD-10-CM | POA: Diagnosis not present

## 2019-12-25 DIAGNOSIS — E039 Hypothyroidism, unspecified: Secondary | ICD-10-CM | POA: Diagnosis present

## 2019-12-25 DIAGNOSIS — R0602 Shortness of breath: Secondary | ICD-10-CM | POA: Diagnosis not present

## 2019-12-25 DIAGNOSIS — Z79890 Hormone replacement therapy (postmenopausal): Secondary | ICD-10-CM | POA: Diagnosis not present

## 2019-12-25 DIAGNOSIS — R9389 Abnormal findings on diagnostic imaging of other specified body structures: Secondary | ICD-10-CM | POA: Diagnosis not present

## 2019-12-25 DIAGNOSIS — I2510 Atherosclerotic heart disease of native coronary artery without angina pectoris: Secondary | ICD-10-CM | POA: Diagnosis not present

## 2019-12-25 DIAGNOSIS — I700 Atherosclerosis of aorta: Secondary | ICD-10-CM | POA: Diagnosis not present

## 2019-12-25 DIAGNOSIS — Z888 Allergy status to other drugs, medicaments and biological substances status: Secondary | ICD-10-CM | POA: Diagnosis not present

## 2019-12-25 DIAGNOSIS — J929 Pleural plaque without asbestos: Secondary | ICD-10-CM | POA: Diagnosis not present

## 2019-12-25 LAB — APTT: aPTT: 30 seconds (ref 24–36)

## 2019-12-25 LAB — I-STAT ARTERIAL BLOOD GAS, ED
Acid-base deficit: 4 mmol/L — ABNORMAL HIGH (ref 0.0–2.0)
Bicarbonate: 19 mmol/L — ABNORMAL LOW (ref 20.0–28.0)
Calcium, Ion: 1.28 mmol/L (ref 1.15–1.40)
HCT: 40 % (ref 36.0–46.0)
Hemoglobin: 13.6 g/dL (ref 12.0–15.0)
O2 Saturation: 95 %
Patient temperature: 98.8
Potassium: 3.6 mmol/L (ref 3.5–5.1)
Sodium: 144 mmol/L (ref 135–145)
TCO2: 20 mmol/L — ABNORMAL LOW (ref 22–32)
pCO2 arterial: 30.1 mmHg — ABNORMAL LOW (ref 32.0–48.0)
pH, Arterial: 7.409 (ref 7.350–7.450)
pO2, Arterial: 73 mmHg — ABNORMAL LOW (ref 83.0–108.0)

## 2019-12-25 LAB — BASIC METABOLIC PANEL
Anion gap: 10 (ref 5–15)
BUN: 14 mg/dL (ref 8–23)
CO2: 19 mmol/L — ABNORMAL LOW (ref 22–32)
Calcium: 9.2 mg/dL (ref 8.9–10.3)
Chloride: 110 mmol/L (ref 98–111)
Creatinine, Ser: 0.91 mg/dL (ref 0.44–1.00)
GFR calc Af Amer: >60 mL/min (ref >60)
GFR calc non Af Amer: 59 mL/min — ABNORMAL LOW (ref >60)
Glucose, Bld: 105 mg/dL — ABNORMAL HIGH (ref 70–99)
Potassium: 3.7 mmol/L (ref 3.5–5.1)
Sodium: 139 mmol/L (ref 135–145)

## 2019-12-25 LAB — PROTIME-INR
INR: 1.1 (ref 0.8–1.2)
Prothrombin Time: 13.9 seconds (ref 11.4–15.2)

## 2019-12-25 LAB — CBC
HCT: 40 % (ref 36.0–46.0)
Hemoglobin: 12.4 g/dL (ref 12.0–15.0)
MCH: 29.8 pg (ref 26.0–34.0)
MCHC: 31 g/dL (ref 30.0–36.0)
MCV: 96.2 fL (ref 80.0–100.0)
Platelets: 215 10*3/uL (ref 150–400)
RBC: 4.16 MIL/uL (ref 3.87–5.11)
RDW: 16.2 % — ABNORMAL HIGH (ref 11.5–15.5)
WBC: 7.7 10*3/uL (ref 4.0–10.5)
nRBC: 0 % (ref 0.0–0.2)

## 2019-12-25 LAB — BRAIN NATRIURETIC PEPTIDE: B Natriuretic Peptide: 119.4 pg/mL — ABNORMAL HIGH (ref 0.0–100.0)

## 2019-12-25 LAB — SARS CORONAVIRUS 2 BY RT PCR (HOSPITAL ORDER, PERFORMED IN ~~LOC~~ HOSPITAL LAB): SARS Coronavirus 2: NEGATIVE

## 2019-12-25 MED ORDER — AMLODIPINE BESYLATE 5 MG PO TABS
5.0000 mg | ORAL_TABLET | Freq: Every day | ORAL | Status: DC
  Administered 2019-12-26 – 2019-12-28 (×3): 5 mg via ORAL
  Filled 2019-12-25 (×4): qty 1

## 2019-12-25 MED ORDER — SIMVASTATIN 20 MG PO TABS
20.0000 mg | ORAL_TABLET | Freq: Every day | ORAL | Status: DC
  Administered 2019-12-25 – 2019-12-27 (×3): 20 mg via ORAL
  Filled 2019-12-25 (×3): qty 1

## 2019-12-25 MED ORDER — GABAPENTIN 100 MG PO CAPS
100.0000 mg | ORAL_CAPSULE | Freq: Every day | ORAL | Status: DC
  Administered 2019-12-26 – 2019-12-27 (×2): 100 mg via ORAL
  Filled 2019-12-25 (×3): qty 1

## 2019-12-25 MED ORDER — METOPROLOL SUCCINATE ER 25 MG PO TB24
25.0000 mg | ORAL_TABLET | Freq: Every day | ORAL | Status: DC
  Administered 2019-12-26 – 2019-12-28 (×3): 25 mg via ORAL
  Filled 2019-12-25 (×4): qty 1

## 2019-12-25 MED ORDER — ASPIRIN 81 MG PO CHEW
81.0000 mg | CHEWABLE_TABLET | Freq: Every day | ORAL | Status: DC
  Administered 2019-12-26 – 2019-12-28 (×3): 81 mg via ORAL
  Filled 2019-12-25 (×4): qty 1

## 2019-12-25 MED ORDER — CLOPIDOGREL BISULFATE 75 MG PO TABS
75.0000 mg | ORAL_TABLET | Freq: Every day | ORAL | Status: DC
  Administered 2019-12-26 – 2019-12-28 (×3): 75 mg via ORAL
  Filled 2019-12-25 (×4): qty 1

## 2019-12-25 MED ORDER — SODIUM CHLORIDE 0.9 % IV SOLN: 250.0000 mL | INTRAVENOUS | Status: DC | PRN

## 2019-12-25 MED ORDER — ENOXAPARIN SODIUM 40 MG/0.4ML ~~LOC~~ SOLN
40.0000 mg | SUBCUTANEOUS | Status: DC
  Administered 2019-12-25 – 2019-12-27 (×3): 40 mg via SUBCUTANEOUS
  Filled 2019-12-25 (×3): qty 0.4

## 2019-12-25 MED ORDER — IOHEXOL 300 MG/ML  SOLN
75.0000 mL | Freq: Once | INTRAMUSCULAR | Status: AC | PRN
  Administered 2019-12-25: 75 mL via INTRAVENOUS

## 2019-12-25 MED ORDER — ACETAMINOPHEN 325 MG PO TABS: 650.0000 mg | ORAL_TABLET | Freq: Four times a day (QID) | ORAL | Status: DC | PRN

## 2019-12-25 MED ORDER — LEVOTHYROXINE SODIUM 50 MCG PO TABS
50.0000 ug | ORAL_TABLET | Freq: Every day | ORAL | Status: DC
  Administered 2019-12-26 – 2019-12-28 (×3): 50 ug via ORAL
  Filled 2019-12-25 (×3): qty 1

## 2019-12-25 MED ORDER — ACETAMINOPHEN 650 MG RE SUPP: 650.0000 mg | Freq: Four times a day (QID) | RECTAL | Status: DC | PRN

## 2019-12-25 MED ORDER — SODIUM CHLORIDE 0.9% FLUSH
3.0000 mL | Freq: Two times a day (BID) | INTRAVENOUS | Status: DC
  Administered 2019-12-25 – 2019-12-28 (×6): 3 mL via INTRAVENOUS

## 2019-12-25 MED ORDER — METHYLPREDNISOLONE 4 MG PO TABS
4.0000 mg | ORAL_TABLET | Freq: Every day | ORAL | Status: DC
  Administered 2019-12-25: 4 mg via ORAL
  Filled 2019-12-25 (×2): qty 1

## 2019-12-25 MED ORDER — SODIUM CHLORIDE 0.9% FLUSH: 3.0000 mL | INTRAVENOUS | Status: DC | PRN

## 2019-12-25 MED ORDER — LISINOPRIL 20 MG PO TABS
20.0000 mg | ORAL_TABLET | Freq: Every day | ORAL | Status: DC
  Administered 2019-12-26 – 2019-12-28 (×3): 20 mg via ORAL
  Filled 2019-12-25 (×4): qty 1

## 2019-12-25 MED ORDER — SENNA 8.6 MG PO TABS
1.0000 | ORAL_TABLET | Freq: Two times a day (BID) | ORAL | Status: DC
  Administered 2019-12-26 – 2019-12-28 (×5): 8.6 mg via ORAL
  Filled 2019-12-25 (×6): qty 1

## 2019-12-25 NOTE — H&P (Signed)
History and Physical    [name] YSA:[id number] DOB: 1940-04-28 DOA: 12/25/2019  PCP: [name], MD (Confirm with patient/family/NH records and if not entered, this has to be entered at [facility] point of entry) Patient coming from: Urgent Care  I have personally briefly reviewed patient's old medical records in [place]  Chief Complaint: SOB/DOE  HPI: [name] is a 79 y.o. female with medical history significant of GERD, hypertension, carotid stenosis, thyroid disease presents for evaluation of acute worsening of progressively worsening shortness of breath for the last 6 months or so.  She particularly notices her shortness of breath with any sort of exertion, going up or down steps, picking up laundry, etc. daughter is at the bedside and helps provide supplemental history.  She states that she was unaware that the patient was experiencing the symptoms but was recently home with the patient and noticed how short of breath the patient became with ambulation.  Patient denies fevers, cough, sick contacts, or Covid exposures.  She is not vaccinated against Covid.  She denies leg swelling, chest pain, abdominal pain, nausea, or vomiting.  No lightheadedness or syncope.  She is a former smoker, quit several decades ago and only smoked for 2 to 3 years.  Family contacted PCP who recommended presentation to urgent care where she was noted to be hypoxic to 87% on room air and sent to the ED for further evaluation.    ED Course: T 98.5  149/66  72  18  99% on 6L O2.  In triage [facility] she was noted to be hypoxemic to 80% on room air, improved on 4 L.  On assessment by ED-PA O2 sats are in the upper 80s on 4 L, bumped up to 6 L/min with O2 sats around 94%. She states she feels better on the supplemental oxygen. Exam notable for SOB and diffuse rales. Lab with nl Bmet, CBC, BNP 119. Covid negative. CXR with chronic hyperinflation, ? ILD. EKG with NSR, possible old inferolateral injury, no  STEMI. TRH called to admit patient with acute respiratory failure with hypoxemia.   Review of Systems: As per HPI otherwise 10 point review of systems negative.    Past Medical History:  Diagnosis Date  . Allergy   . Arthritis   . Carotid artery occlusion   . GERD (gastroesophageal reflux disease)   . Hypertension   . Joint pain   . Thyroid disease     Past Surgical History:  Procedure Laterality Date  . ABDOMINAL HYSTERECTOMY    . AORTIC ARCH ANGIOGRAPHY N/A 03/01/2017   Procedure: AORTIC ARCH ANGIOGRAPHY;  Surgeon: Serafina [name], MD;  Location: [place] CV LAB;  Service: Cardiovascular;  Laterality: N/A;  . ARCH AORTOGRAM N/A 05/04/2011   Procedure: ARCH AORTOGRAM;  Surgeon: Serafina [name], MD;  Location: [facility] CATH LAB;  Service: Cardiovascular;  Laterality: N/A;  . ARCH AORTOGRAM N/A 07/04/2012   Procedure: ARCH AORTOGRAM;  Surgeon: Serafina [name], MD;  Location: [facility] CATH LAB;  Service: Cardiovascular;  Laterality: N/A;  . CAROTID ANGIOGRAM  July 04, 2012  . CAROTID ANGIOGRAM N/A 05/04/2011   Procedure: CAROTID ANGIOGRAM;  Surgeon: Serafina [name], MD;  Location: [facility] CATH LAB;  Service: Cardiovascular;  Laterality: N/A;  . CAROTID ANGIOGRAM N/A 07/04/2012   Procedure: CAROTID ANGIOGRAM;  Surgeon: Serafina [name], MD;  Location: [facility] CATH LAB;  Service: Cardiovascular;  Laterality: N/A;  . CAROTID STENT INSERTION Right 05/11/2011   Procedure: CAROTID STENT INSERTION;  Surgeon: Serafina [name], MD;  Location: [facility] CATH LAB;  Service: Cardiovascular;  Laterality: Right;  . carotid subclavian anastomosis  05/11/11   Right subclav. artery transected 12/17/09  . CATARACT EXTRACTION     bilateral  . EYE SURGERY      Soc Hx -  Married 15 years then widowed. She has two daughters and a son, 4 grandchildren. Her daughter lives with her. 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   reports that she quit smoking about 39 years ago.  Her smoking use included cigarettes. She has a 1.50 pack-year smoking history. She has never used smokeless tobacco. She reports that she does not drink alcohol and does not use drugs.  Allergies  Allergen Reactions  . Meclizine Nausea And Vomiting    Family History  Problem Relation Age of Onset  . Heart failure Mother   . Cancer Father   . Breast cancer Neg Hx     Prior to Admission medications   Medication Sig Start Date End Date Taking? Authorizing Provider  acetaminophen (TYLENOL) 325 MG tablet Take 650 mg every 6 (six) hours as needed by mouth for mild pain or headache.    Yes [provider]  amLODipine (NORVASC) 5 MG tablet TAKE 1 TABLET BY MOUTH EVERY DAY Patient taking differently: Take 5 mg by mouth daily.  10/25/19  Yes [name], MD  aspirin 81 MG chewable tablet Chew 81 mg by mouth daily.   Yes [provider]  calcium-vitamin D (OSCAL WITH D) 500-200 MG-UNIT per tablet Take 1 tablet by mouth daily with breakfast.   Yes [provider]  cetirizine (ZYRTEC) 10 MG tablet Take 10 mg daily as needed by mouth for allergies.    Yes [provider]  clopidogrel (PLAVIX) 75 MG tablet TAKE 1 TABLET BY MOUTH EVERY DAY Patient taking differently: Take 75 mg by mouth daily.  10/06/19  Yes [name], MD  [employer] STOOL SOFTENER 100 MG capsule TAKE 1 CAPSULE (100 MG TOTAL) BY MOUTH DAILY AS NEEDED FOR MILD CONSTIPATION. Patient taking differently: Take 100 mg by mouth daily as needed for mild constipation.  06/05/19  Yes [name], MD  diazepam (VALIUM) 2 MG tablet Take 0.5 tablets (1 mg total) by mouth every 12 (twelve) hours as needed (vertigo). 04/04/19  Yes [name], MD  diclofenac Sodium (VOLTAREN) 1 % GEL Apply 2 g topically 4 (four) times daily. 11/25/19  Yes Darr, Marguerita Beards, PA-C  fluticasone (FLONASE) 50 MCG/ACT nasal spray PLACE 1 SPRAY INTO BOTH NOSTRILS DAILY AS NEEDED FOR ALLERGIES OR RHINITIS (FOR SINUS  CONGESTION). 10/03/19  Yes [name], MD  gabapentin (NEURONTIN) 100 MG capsule TAKE 1 CAPSULE (100 MG TOTAL) BY MOUTH AT BEDTIME. 05/30/19  Yes [name], MD  latanoprost (XALATAN) 0.005 % ophthalmic solution Place 1 drop at bedtime into both eyes. 01/15/17  Yes [provider]  levothyroxine (SYNTHROID) 50 MCG tablet TAKE 1 TABLET BY MOUTH EVERY DAY Patient taking differently: Take 50 mcg by mouth daily before breakfast.  05/08/19  Yes [name], MD  lisinopril (ZESTRIL) 20 MG tablet TAKE 1 TABLET BY MOUTH EVERY DAY Patient taking differently: Take 20 mg by mouth daily.  12/03/19  Yes Argentina Donovan, PA-C  methylPREDNISolone (MEDROL) 4 MG tablet Take 1 tablet (4 mg total) by mouth daily. 11/18/18  Yes Robyn Haber, MD  metoprolol succinate (TOPROL-XL) 25  MG 24 hr tablet TAKE 1 TABLET BY MOUTH EVERY DAY Patient taking differently: Take 25 mg by mouth daily.  09/24/19  Yes [name], MD  Multiple Vitamins-Minerals (ALIVE WOMENS ENERGY) TABS Take 1 tablet by mouth daily.   Yes [provider]  Probiotic Product (PROBIOTIC PO) Take 1 capsule by mouth daily.   Yes [provider]  simvastatin (ZOCOR) 20 MG tablet TAKE 1 TABLET BY MOUTH EVERYDAY AT BEDTIME Patient taking differently: Take 20 mg by mouth at bedtime.  11/23/19  Yes [name], MD  SYSTANE ULTRA 0.4-0.3 % SOLN Place 1 drop into both eyes as directed. 10/30/19  Yes [provider]    Physical Exam: Vitals:   12/25/19 1458 12/25/19 1602 12/25/19 1837  BP: (!) 145/61 (!) 131/53 (!) 149/66  Pulse: 84 78 72  Resp: (!) _0 Temp:   98.8 F (37.1 C)  TempSrc: Oral  Oral  SpO2: (S) (!) 80% 99% 96%     Vitals:   12/25/19 1458 12/25/19 1602 12/25/19 1837  BP: (!) 145/61 (!) 131/53 (!) 149/66  Pulse: 84 78 72  Resp: (!) _1 Temp:   98.8 F (37.1 C)  TempSrc: Oral  Oral  SpO2: (S) (!) 80% 99% 96%   General:  WNWD woman in no distress. Eyes:  PERRL, lids and conjunctivae normal ENMT: Mucous membranes are moist. Posterior pharynx clear of any exudate or lesions. Edentulous  Neck: normal, supple, no masses, no thyromegaly Respiratory: clear to auscultation bilaterally, no wheezing, no crackles. Normal respiratory effort. No accessory muscle use.  Cardiovascular: Regular rate and rhythm, no murmurs / rubs / gallops. No extremity edema. 2+ pedal pulses. No carotid bruits.  Abdomen: no tenderness, no masses palpated. No hepatosplenomegaly. Bowel sounds positive.  Musculoskeletal: no clubbing / cyanosis. No joint deformity upper and lower extremities. Good ROM, no contractures. Normal muscle tone.  Skin: no rashes, lesions, ulcers. No induration Neurologic: CN 2-12 grossly intact. . Strength 5/5 in all 4.  Psychiatric: Normal judgment and insight. Alert and oriented x 3. Normal mood.     Labs on Admission: I have personally reviewed following labs and imaging studies  CBC: Recent Labs  Lab 12/25/19 1517  WBC 7.7  HGB 12.4  HCT 40.0  MCV 96.2  PLT 696   Basic Metabolic Panel: Recent Labs  Lab 12/25/19 1517  NA 139  K 3.7  CL 110  CO2 19*  GLUCOSE 105*  BUN 14  CREATININE 0.91  CALCIUM 9.2   GFR: CrCl cannot be calculated (Unknown ideal weight.). Liver Function Tests: No results for input(s): AST, ALT, ALKPHOS, BILITOT, PROT, ALBUMIN in the last 168 hours. No results for input(s): LIPASE, AMYLASE in the last 168 hours. No results for input(s): AMMONIA in the last 168 hours. Coagulation Profile: Recent Labs  Lab 12/25/19 1517  INR 1.1   Cardiac Enzymes: No results for input(s): CKTOTAL, CKMB, CKMBINDEX, TROPONINI in the last 168 hours. BNP (last 3 results) No results for input(s): PROBNP in the last 8760 hours. HbA1C: No results for input(s): HGBA1C in the last 72 hours. CBG: No results for input(s): GLUCAP in the last 168 hours. Lipid Profile: No results for input(s): CHOL, HDL, LDLCALC, TRIG, CHOLHDL,  LDLDIRECT in the last 72 hours. Thyroid Function Tests: No results for input(s): TSH, T4TOTAL, FREET4, T3FREE, THYROIDAB in the last 72 hours. Anemia Panel: No results for input(s): VITAMINB12, FOLATE, FERRITIN, TIBC, IRON, RETICCTPCT in the last 72 hours. Urine  analysis:    Component Value Date/Time   COLORURINE YELLOW 09/23/2015 0012   APPEARANCEUR CLEAR 09/23/2015 0012   LABSPEC 1.015 11/25/2019 1137   PHURINE 6.5 11/25/2019 1137   GLUCOSEU NEGATIVE 11/25/2019 1137   HGBUR NEGATIVE 11/25/2019 1137   [place] 11/25/2019 1137   [place] 11/25/2019 1137   PROTEINUR NEGATIVE 11/25/2019 1137   UROBILINOGEN 0.2 11/25/2019 1137   NITRITE NEGATIVE 11/25/2019 1137   LEUKOCYTESUR NEGATIVE 11/25/2019 1137    Radiological Exams on Admission: DG Chest 2 View  Result Date: 12/25/2019 CLINICAL DATA:  Shortness of breath with exertion for approximately 6-7 months EXAM: CHEST - 2 VIEW COMPARISON:  Radiograph 12/04/2019 FINDINGS: There is chronic hyperinflation. Some coarsened interstitial changes are present in both lungs which are increased from comparison radiography particularly towards the lung bases. Some mild bronchitic changes are noted as well. Small nodular, likely calcific radiodensity is seen along the right paratracheal stripe, likely present on comparison. Vascular stent noted in the suspected location of the brachiocephalic vein. The aorta is calcified. The remaining cardiomediastinal contours are unremarkable. No acute osseous or soft tissue abnormality. Degenerative changes are present in the imaged spine and shoulders. Features of bilateral calcific tendinosis of the shoulders. IMPRESSION: 1. Diffusely coarsened interstitial changes throughout the lungs may reflect some developing interstitial lung disease or atypical infection. 2. Chronic hyperinflation. 3. Likely small pleural plaque or calcified granuloma in the right upper lobe. 4.  Aortic Atherosclerosis  (ICD10-I70.0). Electronically Signed   By: Lovena Le M.D.   On: 12/25/2019 16:05    EKG: Independently reviewed. NSR, possible inferolateral ischemia, no STEMI  Assessment/Plan Active Problems:   Acute on chronic respiratory failure with hypoxia (HCC)   Essential hypertension   Hyperlipidemia   Hypothyroidism   Acute respiratory failure with hypoxemia ([place])    1. Acute on chronic respiratory failure - 6 month h/o progressive SOB. NO prior dx of COPD, CHF, no h/o RA or connective tissue disease, no evidence of infection. CXR suggests ILD. Patient did have occupational exposure many years ago.   Plan Progressive unit admit due to high oxygen requirement  ABG  CT chest  2D echo  D-dimer  Serology: ANA, CK, Aldose, Sjogren's  Continue O2 support  Will need pulmonary consult  2. Hypothyroidism - continue present med. Add TSH to lab  3. HLD - continue home med  4. HTN - stable. Continue home meds  5. Code status - discussed with patient - Full code  DVT prophylaxis: Lovenox plus ASA/Plavix  Code Status: Full code  Family Communication: Daughter present during exam and understands dx and tx plan   Disposition Plan: TBD  Consults called: none  Admission status: inpatient/SDU    Adella Hare MD [facility] Pager [phone]  If 7PM-7AM, please contact night-coverage [email or web address] Password [facility]  12/25/2019, 9:39 PM

## 2019-12-25 NOTE — ED Notes (Signed)
Room air O2 sats 80%, Patient placed on 4L O2 via [place], sats improved to 99%

## 2019-12-25 NOTE — ED Provider Notes (Signed)
[facility] EMERGENCY DEPARTMENT Provider Note   CSN: [id number] Arrival date & time: 12/25/19  1450     History Chief Complaint  Patient presents with  . Shortness of Breath    [name] is a 79 y.o. female with history of GERD, hypertension, carotid stenosis, thyroid disease presents for evaluation of acute onset, progressively worsening shortness of breath for the last 6 months or so.  She particularly notices her shortness of breath with any sort of exertion, going up or down steps, picking up laundry, etc. daughter is at the bedside and helps provide supplemental history.  She states that she was unaware that the patient was experiencing the symptoms but was recently home with the patient and noticed how short of breath the patient became with ambulation.  Patient denies fevers, cough, sick contacts, or Covid exposures.  She is not vaccinated against Covid.  She denies leg swelling, chest pain, abdominal pain, nausea, or vomiting.  No lightheadedness or syncope.  She is a former smoker, quit several decades ago and only smoked for 2 to 3 years.  Family contacted PCP who recommended presentation to urgent care where she was noted to be hypoxic to 87% on room air and sent to the ED for further evaluation.  There in triage she was noted to be hypoxic to 80% on room air, improved on 4 L.  On my assessment O2 sats are in the upper 80s on 4 L, bumped up to 6 L/min with O2 sats around 94%. She states she feels better on the supplemental oxygen.   The history is provided by the patient.       Past Medical History:  Diagnosis Date  . Allergy   . Arthritis   . Carotid artery occlusion   . GERD (gastroesophageal reflux disease)   . Hypertension   . Joint pain   . Thyroid disease     Patient Active Problem List   Diagnosis Date Noted  . Essential hypertension 06/06/2017  . Hyperlipidemia 06/06/2017  . Leg cramps 06/06/2017  . Vertigo 06/06/2017  . Macular  degeneration 06/06/2017  . Hypothyroidism 06/06/2017  . Carotid stenosis 01/22/2014  . Aftercare following surgery of the circulatory system, [place] 01/02/2013  . Occlusion and stenosis of carotid artery without mention of cerebral infarction 04/27/2011    Past Surgical History:  Procedure Laterality Date  . ABDOMINAL HYSTERECTOMY    . AORTIC ARCH ANGIOGRAPHY N/A 03/01/2017   Procedure: AORTIC ARCH ANGIOGRAPHY;  Surgeon: Serafina [name], MD;  Location: [facility] CV LAB;  Service: Cardiovascular;  Laterality: N/A;  . ARCH AORTOGRAM N/A 05/04/2011   Procedure: ARCH AORTOGRAM;  Surgeon: Serafina [name], MD;  Location: [facility] CATH LAB;  Service: Cardiovascular;  Laterality: N/A;  . ARCH AORTOGRAM N/A 07/04/2012   Procedure: ARCH AORTOGRAM;  Surgeon: Serafina [name], MD;  Location: [facility] CATH LAB;  Service: Cardiovascular;  Laterality: N/A;  . CAROTID ANGIOGRAM  July 04, 2012  . CAROTID ANGIOGRAM N/A 05/04/2011   Procedure: CAROTID ANGIOGRAM;  Surgeon: Serafina [name], MD;  Location: [facility] CATH LAB;  Service: Cardiovascular;  Laterality: N/A;  . CAROTID ANGIOGRAM N/A 07/04/2012   Procedure: CAROTID ANGIOGRAM;  Surgeon: Serafina [name], MD;  Location: [facility] CATH LAB;  Service: Cardiovascular;  Laterality: N/A;  . CAROTID STENT INSERTION Right 05/11/2011   Procedure: CAROTID STENT INSERTION;  Surgeon: Serafina [name], MD;  Location: [facility] CATH LAB;  Service: Cardiovascular;  Laterality: Right;  . carotid subclavian anastomosis  05/11/11   Right subclav. artery transected 12/17/09  . CATARACT EXTRACTION     bilateral  . EYE SURGERY       OB History   No obstetric history on file.     Family History  Problem Relation Age of Onset  . Heart failure Mother   . Cancer Father   . Breast cancer Neg Hx     Social History   Tobacco Use  . Smoking status: Former Smoker    Packs/day: 0.30    Years: 5.00    Pack years: 1.50    Types: Cigarettes    Quit date: 04/12/1980    Years since quitting: 39.7  .  Smokeless tobacco: Never Used  Vaping Use  . Vaping Use: Never used  Substance Use Topics  . Alcohol use: No  . Drug use: No    Home Medications Prior to Admission medications   Medication Sig Start Date End Date Taking? Authorizing Provider  acetaminophen (TYLENOL) 325 MG tablet Take 650 mg every 6 (six) hours as needed by mouth for mild pain or headache.     [provider]  amLODipine (NORVASC) 5 MG tablet TAKE 1 TABLET BY MOUTH EVERY DAY 10/25/19   [name], MD  aspirin 81 MG chewable tablet Chew 81 mg by mouth daily.    [provider]  calcium-vitamin D (OSCAL WITH D) 500-200 MG-UNIT per tablet Take 1 tablet by mouth daily with breakfast.    [provider]  cetirizine (ZYRTEC) 10 MG tablet Take 10 mg daily as needed by mouth for allergies.     [provider]  clopidogrel (PLAVIX) 75 MG tablet TAKE 1 TABLET BY MOUTH EVERY DAY 10/06/19   [name], MD  [employer] STOOL SOFTENER 100 MG capsule TAKE 1 CAPSULE (100 MG TOTAL) BY MOUTH DAILY AS NEEDED FOR MILD CONSTIPATION. 06/05/19   [name], MD  diazepam (VALIUM) 2 MG tablet Take 0.5 tablets (1 mg total) by mouth every 12 (twelve) hours as needed (vertigo). 04/04/19   [name], MD  diclofenac Sodium (VOLTAREN) 1 % GEL Apply 2 g topically 4 (four) times daily. 11/25/19   Darr, Marguerita Beards, PA-C  fluticasone (FLONASE) 50 MCG/ACT nasal spray PLACE 1 SPRAY INTO BOTH NOSTRILS DAILY AS NEEDED FOR ALLERGIES OR RHINITIS (FOR SINUS CONGESTION). 10/03/19   [name], MD  gabapentin (NEURONTIN) 100 MG capsule TAKE 1 CAPSULE (100 MG TOTAL) BY MOUTH AT BEDTIME. 05/30/19   [name], MD  latanoprost (XALATAN) 0.005 % ophthalmic solution Place 1 drop at bedtime into both eyes. 01/15/17   [provider]  levothyroxine (SYNTHROID) 50 MCG tablet TAKE 1 TABLET BY MOUTH EVERY DAY 05/08/19   [name], MD  lisinopril (ZESTRIL) 20 MG tablet TAKE 1 TABLET BY MOUTH EVERY  DAY 12/03/19   Argentina Donovan, PA-C  methylPREDNISolone (MEDROL) 4 MG tablet Take 1 tablet (4 mg total) by mouth daily. 11/18/18   Robyn Haber, MD  metoprolol succinate (TOPROL-XL) 25 MG 24 hr tablet TAKE 1 TABLET BY MOUTH EVERY DAY 09/24/19   [name], MD  Multiple Vitamins-Minerals (ALIVE WOMENS ENERGY) TABS Take 1 tablet by mouth daily.    [provider]  Probiotic Product (PROBIOTIC PO) Take 1 capsule by mouth daily.    [provider]  simvastatin (ZOCOR) 20 MG tablet TAKE 1 TABLET BY MOUTH EVERYDAY AT BEDTIME 11/23/19   [name], MD  SYSTANE ULTRA 0.4-0.3 % SOLN Place  1 drop into both eyes as directed. 10/30/19   [provider]    Allergies    Meclizine  Review of Systems   Review of Systems  Constitutional: Negative for chills and fever.  Respiratory: Positive for shortness of breath. Negative for cough.   Cardiovascular: Negative for chest pain and leg swelling.  Gastrointestinal: Negative for abdominal pain, diarrhea, nausea and vomiting.  All other systems reviewed and are negative.   Physical Exam Updated Vital Signs BP (!) 149/66   Pulse 72   Temp 98.8 F (37.1 C) (Oral)   Resp 18   SpO2 96%   Physical Exam Vitals and nursing note reviewed.  Constitutional:      General: She is not in acute distress.    Appearance: She is well-developed.  HENT:     Head: Normocephalic and atraumatic.  Eyes:     General:        Right eye: No discharge.        Left eye: No discharge.     Conjunctiva/sclera: Conjunctivae normal.  Neck:     Vascular: No JVD.     Trachea: No tracheal deviation.  Cardiovascular:     Rate and Rhythm: Normal rate and regular rhythm.  Pulmonary:     Comments: Diffuse crackles noted.  SPO2 saturations 88% on 4 L supplemental oxygen, 94 to 96% on 6L supplemental oxygen Abdominal:     General: There is no distension.     Palpations: Abdomen is soft.     Tenderness: There is no abdominal tenderness.  There is no guarding.  Musculoskeletal:     Right lower leg: No tenderness. No edema.     Left lower leg: No tenderness. No edema.  Skin:    General: Skin is warm.     Findings: No erythema.  Neurological:     Mental Status: She is alert.  Psychiatric:        Behavior: Behavior normal.     ED Results / Procedures / Treatments   Labs (all labs ordered are listed, but only abnormal results are displayed) Labs Reviewed  CBC - Abnormal; Notable for the following components:      Result Value   RDW 16.2 (*)    All other components within normal limits  BASIC METABOLIC PANEL - Abnormal; Notable for the following components:   CO2 19 (*)    Glucose, Bld 105 (*)    GFR calc non Af Amer 59 (*)    All other components within normal limits  BRAIN NATRIURETIC PEPTIDE - Abnormal; Notable for the following components:   B Natriuretic Peptide 119.4 (*)    All other components within normal limits  SARS CORONAVIRUS 2 BY RT PCR (HOSPITAL ORDER, [facility])  PROTIME-INR  APTT    EKG EKG Interpretation  Date/Time:  Tuesday December 25 2019 14:54:38 EDT Ventricular Rate:  83 PR Interval:  184 QRS Duration: 76 QT Interval:  312 QTC Calculation: 366 R Axis:   32 Text Interpretation: Normal sinus rhythm new ST & T wave abnormality, consider inferolateral ischemia Confirmed by Blanchie Dessert [phone]) on 12/25/2019 6:52:10 PM   Radiology DG Chest 2 View  Result Date: 12/25/2019 CLINICAL DATA:  Shortness of breath with exertion for approximately 6-7 months EXAM: CHEST - 2 VIEW COMPARISON:  Radiograph 12/04/2019 FINDINGS: There is chronic hyperinflation. Some coarsened interstitial changes are present in both lungs which are increased from comparison radiography particularly towards the lung bases. Some mild bronchitic changes  are noted as well. Small nodular, likely calcific radiodensity is seen along the right paratracheal stripe, likely present on comparison.  Vascular stent noted in the suspected location of the brachiocephalic vein. The aorta is calcified. The remaining cardiomediastinal contours are unremarkable. No acute osseous or soft tissue abnormality. Degenerative changes are present in the imaged spine and shoulders. Features of bilateral calcific tendinosis of the shoulders. IMPRESSION: 1. Diffusely coarsened interstitial changes throughout the lungs may reflect some developing interstitial lung disease or atypical infection. 2. Chronic hyperinflation. 3. Likely small pleural plaque or calcified granuloma in the right upper lobe. 4.  Aortic Atherosclerosis (ICD10-I70.0). Electronically Signed   By: Lovena Le M.D.   On: 12/25/2019 16:05    Procedures .Critical Care Performed by: Renita Papa, PA-C Authorized by: Renita Papa, PA-C   Critical care provider statement:    Critical care time (minutes):  45   Critical care was necessary to treat or prevent imminent or life-threatening deterioration of the following conditions:  Respiratory failure   Critical care was time spent personally by me on the following activities:  Discussions with consultants, evaluation of patient's response to treatment, examination of patient, ordering and performing treatments and interventions, ordering and review of laboratory studies, ordering and review of radiographic studies, pulse oximetry, re-evaluation of patient's condition, obtaining history from patient or surrogate and review of old charts   (including critical care time)  Medications Ordered in ED Medications - No data to display  ED Course  I have reviewed the triage vital signs and the nursing notes.  Pertinent labs & imaging results that were available during my care of the patient were reviewed by me and considered in my medical decision making (see chart for details).    MDM Rules/Calculators/A&P                          Bethan TAMELLA TUCCILLO was evaluated in Emergency Department on 12/25/2019  for the symptoms described in the history of present illness. She was evaluated in the context of the global COVID-19 pandemic, which necessitated consideration that the patient might be at risk for infection with the SARS-CoV-2 virus that causes COVID-19. Institutional protocols and algorithms that pertain to the evaluation of patients at risk for COVID-19 are in a state of rapid change based on information released by regulatory bodies including the CDC and federal and state organizations. These policies and algorithms were followed during the patient's care in the ED.  Patient presenting for evaluation of progressively worsening shortness of breath for 6 months.  Found to be hypoxic at urgent care on initial assessment and is hypoxic to 80% on room air in the ED.  SPO2 saturations improved on 6 L supplemental oxygen via nasal cannula.  Chest x-ray shows findings concerning for interstitial lung disease versus atypical infection though I think the latter is less likely given the duration of her symptoms and lack of infectious symptoms on arrival to the ED.  Lab work reviewed and interpreted by myself shows no leukocytosis, no anemia, no renal insufficiency or metabolic derangements.  BNP today is very mildly elevated though I have a low suspicion of CHF at this time and patient does not currently appear volume overloaded.   With new oxygen requirement patient will require admission to the hospital for further evaluation and management.  Spoke with Dr. Linda Hedges with [facility] hospitalist service who agrees to assume care of patient and bring her into the hospital  for further evaluation and management.  Covid test is currently pending.  Patient seen and evaluate by Dr. Maryan Rued who agrees with assessment and plan at this time.    Final Clinical Impression(s) / ED Diagnoses Final diagnoses:  Acute respiratory failure with hypoxia [facility])    Rx / DC Orders ED Discharge Orders    None       Debroah Baller 12/25/19 2025    Blanchie Dessert, MD 12/25/19 2255

## 2019-12-25 NOTE — ED Triage Notes (Signed)
Pt reports sob x6-7 months, pts daughter states she was unaware and recently noticed how sob pt becomes when trying to walk. Pt denies cp or swelling in legs or ankles, reports that she cleans often with clorox. resp e/u, nad.

## 2019-12-25 NOTE — ED Notes (Signed)
Patient is being discharged from the Urgent Care and sent to the Emergency Department via private vehicle. Per Dr. Mannie Stabile, patient is in need of higher level of care due to hypoxia and SOB. Patient is aware and verbalizes understanding of plan of care.  Vitals:   12/25/19 1436  BP: (!) 136/59  Pulse: 83  Resp: (!) 22  Temp: 98.5 F (36.9 C)  SpO2: (!) 87%

## 2019-12-26 ENCOUNTER — Inpatient Hospital Stay (HOSPITAL_COMMUNITY): Payer: Medicare Other

## 2019-12-26 DIAGNOSIS — J9601 Acute respiratory failure with hypoxia: Secondary | ICD-10-CM

## 2019-12-26 DIAGNOSIS — J9621 Acute and chronic respiratory failure with hypoxia: Secondary | ICD-10-CM

## 2019-12-26 DIAGNOSIS — I361 Nonrheumatic tricuspid (valve) insufficiency: Secondary | ICD-10-CM

## 2019-12-26 DIAGNOSIS — R9389 Abnormal findings on diagnostic imaging of other specified body structures: Secondary | ICD-10-CM

## 2019-12-26 DIAGNOSIS — I371 Nonrheumatic pulmonary valve insufficiency: Secondary | ICD-10-CM

## 2019-12-26 DIAGNOSIS — R0602 Shortness of breath: Secondary | ICD-10-CM | POA: Diagnosis present

## 2019-12-26 LAB — D-DIMER, QUANTITATIVE: D-Dimer, Quant: 0.83 ug/mL-FEU — ABNORMAL HIGH (ref 0.00–0.50)

## 2019-12-26 LAB — ECHOCARDIOGRAM COMPLETE
Area-P 1/2: 2.91 cm2
S' Lateral: 2.7 cm

## 2019-12-26 LAB — HEMOGLOBIN A1C
Hgb A1c MFr Bld: 5.7 % — ABNORMAL HIGH (ref 4.8–5.6)
Mean Plasma Glucose: 116.89 mg/dL

## 2019-12-26 LAB — TSH: TSH: 4.257 u[IU]/mL (ref 0.350–4.500)

## 2019-12-26 LAB — CK: Total CK: 101 U/L (ref 38–234)

## 2019-12-26 MED ORDER — PREDNISONE 20 MG PO TABS
30.0000 mg | ORAL_TABLET | Freq: Every day | ORAL | Status: DC
  Administered 2019-12-27 – 2019-12-28 (×2): 30 mg via ORAL
  Filled 2019-12-26 (×2): qty 1

## 2019-12-26 MED ORDER — IPRATROPIUM-ALBUTEROL 0.5-2.5 (3) MG/3ML IN SOLN
3.0000 mL | Freq: Four times a day (QID) | RESPIRATORY_TRACT | Status: DC
  Administered 2019-12-26: 3 mL via RESPIRATORY_TRACT
  Filled 2019-12-26: qty 3

## 2019-12-26 MED ORDER — POLYETHYL GLYCOL-PROPYL GLYCOL 0.4-0.3 % OP SOLN: 1.0000 [drp] | OPHTHALMIC | Status: DC

## 2019-12-26 MED ORDER — DOXYCYCLINE HYCLATE 100 MG PO TABS: 100.0000 mg | ORAL_TABLET | Freq: Two times a day (BID) | ORAL | Status: DC

## 2019-12-26 MED ORDER — HYPROMELLOSE (GONIOSCOPIC) 2.5 % OP SOLN
1.0000 [drp] | OPHTHALMIC | Status: DC | PRN
  Filled 2019-12-26: qty 15

## 2019-12-26 MED ORDER — LATANOPROST 0.005 % OP SOLN
1.0000 [drp] | Freq: Every day | OPHTHALMIC | Status: DC
  Administered 2019-12-27: 1 [drp] via OPHTHALMIC
  Filled 2019-12-26 (×2): qty 2.5

## 2019-12-26 MED ORDER — DOXYCYCLINE HYCLATE 100 MG PO TABS
100.0000 mg | ORAL_TABLET | Freq: Two times a day (BID) | ORAL | Status: DC
  Administered 2019-12-26 – 2019-12-28 (×4): 100 mg via ORAL
  Filled 2019-12-26 (×4): qty 1

## 2019-12-26 NOTE — Progress Notes (Signed)
PROGRESS NOTE        PATIENT DETAILS Name: [name] Age: 79 y.o. Sex: female Date of Birth: 1940/12/13 Admit Date: 12/25/2019 Admitting Physician Evalee Mutton Kristeen Mans, MD [id number], Dalbert Batman, MD  Brief Narrative: Patient is a 79 y.o. female with history of subclavian artery stenosis, HTN, hypothyroidism-presented with 5-week history of worsening exertional dyspnea.  Found to be hypoxic with O2 sats in the low 80s on room air-requiring around 4 L of oxygen-subsequently admitted to the hospitalist service for further evaluation and treatment.  COVID-19 vaccination status: Unvaccinated  Significant events: 9/14>> presenting with exertional dyspnea-hypoxic-requiring O2 supplementation.  Admit to [facility].  Significant studies: 9/14>> CT chest: Spiculated 7 mm right upper lobe nodule, 5 mm pulmonary nodule-suggestion of several adjacent satellite micronodules.  Right upper lobe infiltrate with numerous mediastinal/hilar lymphadenopathy.  Antimicrobial therapy: None  Microbiology data: 9/14>>SARS Coronavirus 2: Negative  Procedures : None  Consults: PCCM  DVT Prophylaxis : enoxaparin (LOVENOX) injection 40 mg Start: 12/25/19 2200   Subjective: Lying comfortably in bed-no shortness of breath at rest.  Acknowledges worsening shortness of breath over the past week or so-mostly with ambulation.  Not able to move around in the house as much as she did before.  Assessment/Plan: Acute hypoxic respiratory failure: Etiology unclear-no evidence of volume overload on exam-no major parenchymal disease evident on CT chest.  Await echo-we will go ahead and consult pulmonology for further recommendations.  Autoimmune work-up (ANA etc.) sent by admitting MD for possible ILD.  Requiring anywhere from 2-4 L of oxygen this morning-ambulate with physical therapy-await echo-and further recommendations from PCCM.    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 Yes; Fluid consistency: Thin  Diet effective now                  Code Status: Full code  Family Communication: Daughter at bedside  Disposition Plan: Status is: Inpatient  Remains inpatient appropriate because:Inpatient level of care appropriate due to severity of illness   Dispo: The patient is from: Home              Anticipated d/c is to: Home              Anticipated d/c date is: 2 days              Patient currently is not medically stable to d/c.    Barriers to Discharge: Hypoxia requiring O2 supplementation-awaiting further work-up to determine etiology of hypoxemia.  Antimicrobial agents: Anti-infectives (From admission, onward)   None      Time spent: 25 minutes-Greater than 50% of this time was spent in counseling, explanation of diagnosis, planning of further management, and coordination of care.  MEDICATIONS: Scheduled Meds: . amLODipine  5 mg Oral Daily  . aspirin  81 mg Oral Daily  . clopidogrel  75 mg Oral Daily  . enoxaparin (LOVENOX) injection  40 mg Subcutaneous Q24H  . gabapentin  100 mg Oral QHS  . latanoprost  1 drop Both Eyes QHS  . levothyroxine  50 mcg Oral QAC breakfast  . lisinopril  20 mg Oral Daily  . metoprolol succinate  25 mg Oral Daily  . Polyethyl Glycol-Propyl Glycol  1 drop Both Eyes UD  . senna  1 tablet Oral BID  . simvastatin  20 mg Oral QHS  . sodium chloride flush  3 mL Intravenous Q12H   Continuous Infusions: . sodium chloride     PRN Meds:.sodium chloride, acetaminophen **OR** acetaminophen, sodium chloride flush   PHYSICAL EXAM: Vital signs: Vitals:   12/26/19 0545  12/26/19 0722 12/26/19 0819 12/26/19 0940  BP:   (!) 148/60 (!) 145/70  Pulse: 62 60 86 75  Resp: _0 Temp:   98.5 F (36.9 C)   TempSrc:      SpO2: 96% 99% 98%    There were no vitals filed for this visit. There is no height or weight on file to calculate BMI.   Gen Exam:Alert awake-not in any distress HEENT:atraumatic, normocephalic Chest: B/L clear to auscultation anteriorly CVS:S1S2 regular Abdomen:soft non tender, non distended Extremities:no edema Neurology: Non focal Skin: no rash  I have personally reviewed following labs and imaging studies  LABORATORY DATA: CBC: Recent Labs  Lab 12/25/19 1517 12/25/19 2218  WBC 7.7  --   HGB 12.4 13.6  HCT 40.0 40.0  MCV 96.2  --   PLT 215  --     Basic Metabolic Panel: Recent Labs  Lab 12/25/19 1517 12/25/19 2218  NA 139 144  K 3.7 3.6  CL 110  --   CO2 19*  --   GLUCOSE 105*  --   BUN 14  --   CREATININE 0.91  --   CALCIUM 9.2  --     GFR: CrCl cannot be calculated (Unknown ideal weight.).  Liver Function Tests: No results for input(s): AST, ALT, ALKPHOS, BILITOT, PROT, ALBUMIN in the last 168 hours. No results for input(s): LIPASE, AMYLASE in the last 168 hours. No results for input(s): AMMONIA in the last 168 hours.  Coagulation Profile: Recent Labs  Lab 12/25/19 1517  INR 1.1    Cardiac Enzymes: Recent Labs  Lab 12/26/19 0709  CKTOTAL 101    BNP (last 3 results) No results for input(s): PROBNP in the last 8760 hours.  Lipid Profile: No results for input(s): CHOL, HDL, LDLCALC, TRIG, CHOLHDL, LDLDIRECT in the last 72 hours.  Thyroid Function Tests: Recent Labs    12/26/19 0709  TSH 4.257    Anemia Panel: No results for input(s): VITAMINB12, FOLATE, FERRITIN, TIBC, IRON, RETICCTPCT in the last 72 hours.  Urine analysis:    Component Value Date/Time   COLORURINE YELLOW 09/23/2015 0012   APPEARANCEUR CLEAR 09/23/2015 0012   LABSPEC 1.015 11/25/2019 1137   PHURINE 6.5  11/25/2019 1137   GLUCOSEU NEGATIVE 11/25/2019 1137   HGBUR NEGATIVE 11/25/2019 1137   BILIRUBINUR NEGATIVE 11/25/2019 1137   KETONESUR NEGATIVE 11/25/2019 1137   PROTEINUR NEGATIVE 11/25/2019 1137   UROBILINOGEN 0.2 11/25/2019 1137   NITRITE NEGATIVE 11/25/2019 1137   LEUKOCYTESUR NEGATIVE 11/25/2019 1137    Sepsis Labs: Lactic Acid, Venous No results found for: LATICACIDVEN  MICROBIOLOGY: Recent Results (from the past 240 hour(s))  SARS Coronavirus 2 by RT PCR (hospital order, performed in [facility] hospital lab) Nasopharyngeal Nasopharyngeal Swab     Status: None   Collection Time: 12/25/19  6:52 PM   Specimen: Nasopharyngeal Swab  Result Value Ref Range Status   SARS Coronavirus 2 NEGATIVE NEGATIVE Final  Comment: (NOTE) SARS-CoV-2 target nucleic acids are NOT DETECTED.  The SARS-CoV-2 RNA is generally detectable in upper and lower respiratory specimens during the acute phase of infection. The lowest concentration of SARS-CoV-2 viral copies this assay can detect is 250 copies / mL. A negative result does not preclude SARS-CoV-2 infection and should not be used as the sole basis for treatment or other patient management decisions.  A negative result may occur with improper specimen collection / handling, submission of specimen other than nasopharyngeal swab, presence of viral mutation(s) within the areas targeted by this assay, and inadequate number of viral copies (<250 copies / mL). A negative result must be combined with clinical observations, patient history, and epidemiological information.  Fact Sheet for Patients:   StrictlyIdeas.no  Fact Sheet for Healthcare Providers: BankingDealers.co.za  This test is not yet approved or  cleared by the [place] FDA and has been authorized for detection and/or diagnosis of SARS-CoV-2 by FDA under an Emergency Use Authorization (EUA).  This EUA will remain in effect  (meaning this test can be used) for the duration of the COVID-19 declaration under Section 564(b)(1) of the Act, 21 U.S.C. section 360bbb-3(b)(1), unless the authorization is terminated or revoked sooner.  Performed at [facility], [place] [address], [place],  [address]     RADIOLOGY STUDIES/RESULTS: DG Chest 2 View  Result Date: 12/25/2019 CLINICAL DATA:  Shortness of breath with exertion for approximately 6-7 months EXAM: CHEST - 2 VIEW COMPARISON:  Radiograph 12/04/2019 FINDINGS: There is chronic hyperinflation. Some coarsened interstitial changes are present in both lungs which are increased from comparison radiography particularly towards the lung bases. Some mild bronchitic changes are noted as well. Small nodular, likely calcific radiodensity is seen along the right paratracheal stripe, likely present on comparison. Vascular stent noted in the suspected location of the brachiocephalic vein. The aorta is calcified. The remaining cardiomediastinal contours are unremarkable. No acute osseous or soft tissue abnormality. Degenerative changes are present in the imaged spine and shoulders. Features of bilateral calcific tendinosis of the shoulders. IMPRESSION: 1. Diffusely coarsened interstitial changes throughout the lungs may reflect some developing interstitial lung disease or atypical infection. 2. Chronic hyperinflation. 3. Likely small pleural plaque or calcified granuloma in the right upper lobe. 4.  Aortic Atherosclerosis (ICD10-I70.0). Electronically Signed   By: Lovena Le M.D.   On: 12/25/2019 16:05   CT CHEST W CONTRAST  Result Date: 12/25/2019 CLINICAL DATA:  Respiratory failure, hypertension, GERD, carotid artery occlusion, carotid subclavian anastomosis, carotid stent insertion EXAM: CT CHEST WITH CONTRAST TECHNIQUE: Multidetector CT imaging of the chest was performed during intravenous contrast administration. CONTRAST:  47m OMNIPAQUE IOHEXOL 300 MG/ML  SOLN COMPARISON:   Chest x-ray 12/25/2019 FINDINGS: Cardiovascular: Normal heart size. No significant pericardial effusion. The thoracic aorta is normal in caliber. At least moderate calcified and noncalcified atherosclerotic plaque of the thoracic aorta. Right common carotid origin stent placement noted with patency evaluation limited due to streak artifact originating from adjacent venous structures containing intravenous contrast. At least mild to moderate four-vessel coronary artery calcifications. The main pulmonary artery is borderline enlarged measuring up to 3 cm. No central or segmental pulmonary embolus. Mediastinum/Nodes: Multiple enlarged mediastinal and bilateral hilar lymph nodes with as an example a 1.1 cm right paratracheal (3:47), 1.3 cm left paratracheal (3:53), 1 cm subcarinal (3:67), 1 cm right hilar (3:72), 1 cm left hilar, 3:77) Lymph nodes. Prominent paraesophageal and retrocrural lymph nodes open (3:58, 131). No enlarged axillary lymph nodes. Thyroid gland,  trachea, and esophagus demonstrate no significant findings. Lungs/Pleura: Spiculated 7 mm right upper lobe pulmonary nodule (5:39). 5 mm pulmonary nodule posteriorly (5:39). Suggestion of several adjacent satellite micronodules within the peripheral right upper lobe open (5:30-46). Pulmonary micronodule within the left upper lobe that is ground-glass in density (5:47). Scattered centrilobular micronodules within the left upper lobe. Diffuse mild interlobular septal wall thickening. Mild diffuse bronchial wall thickening. No pleural effusion or pneumothorax. Upper Abdomen: Fluid-fluid level within the gastric lumen likely related to ingested material. Interval decrease in size of a now punctate calcification within the common bile duct open (6:40). Otherwise no acute abnormality Musculoskeletal: No chest wall abnormality No suspicious lytic or blastic osseous lesions. No acute displaced fracture. Multilevel degenerative changes of the spine. IMPRESSION: 1.  Right upper lobe atypical pneumonia with likely reactive mediastinal and hilar lymphadenopathy. Recommend repeat CT in 6-8 weeks status post treatment for infection to exclude an underlying malignancy. 2. A 4 mm and spiculated 7 mm right upper lobe nodule. Non-contrast chest CT at 3-6 months is recommended. If the nodules are stable at time of repeat CT, then future CT at 18-24 months (from today's scan) is considered optional for low-risk patients, but is recommended for high-risk patients. This recommendation follows the consensus statement: Guidelines for Management of Incidental Pulmonary Nodules Detected on CT Images: From the Fleischner Society 2017; Radiology 2017; 284:228-243. 3.  Aortic Atherosclerosis (ICD10-I70.0). Electronically Signed   By: Iven Finn M.D.   On: 12/25/2019 23:48     LOS: 1 day   Oren Binet, MD  [facility]    To contact the attending provider between 7A-7P or the covering provider during after hours 7P-7A, please log into the web site [email or web address] and access using universal [place] password for that web site. If you do not have the password, please call the hospital operator.  12/26/2019, 11:08 AM

## 2019-12-26 NOTE — Progress Notes (Signed)
  Echocardiogram 2D Echocardiogram has been performed.  Fidel Levy 12/26/2019, 12:21 PM

## 2019-12-26 NOTE — ED Notes (Signed)
Pt asking for ice water. Informed Lynnze - RN.

## 2019-12-26 NOTE — Consult Note (Signed)
   NAME:  [name], MRN:  [id number], DOB:  02-01-1941, LOS: 1 ADMISSION DATE:  12/25/2019, CONSULTATION DATE:  12/26/2019 REFERRING MD:  Sloan Leiter, CHIEF COMPLAINT:  SOB   Brief History   Patient with shortness of breath of about 6 weeks duration She was treated with a course of antibiotics at the onset of the illness Did improve a little bit Continues to be short of breath Daily cough with mucus production Has not had any fevers or chills  History of present illness   Presented to the hospital this time around secondary to more noticeable symptoms of shortness of breath Has not felt acutely ill No fevers, no chills, no weight loss, preserved appetite  Past Medical History   Past Medical History:  Diagnosis Date  . Allergy   . Arthritis   . Carotid artery occlusion   . GERD (gastroesophageal reflux disease)   . Hypertension   . Joint pain   . Thyroid disease     Consults:  Pulmonary  Procedures:  None  Significant Diagnostic Tests:  CT chest  IMPRESSION: 1. Right upper lobe atypical pneumonia with likely reactive mediastinal and hilar lymphadenopathy. Recommend repeat CT in 6-8 weeks status post treatment for infection to exclude an underlying malignancy. 2. A 4 mm and spiculated 7 mm right upper lobe nodule. Non-contrast chest CT at 3-6 months is recommended. If the nodules are stable at time of repeat CT, then future CT at 18-24 months (from today's scan) is considered optional for low-risk patients, but is recommended for high-risk patients. This recommendation follows the consensus statement: Guidelines for Management of Incidental Pulmonary Nodules Detected on CT Images: From the Fleischner Society 2017; Radiology 2017; 284:228-243. 3.  Aortic Atherosclerosis (ICD10-I70.0).  Micro Data:  Coronavirus negative  Antimicrobials:  None  Interim history/subjective:  Breathing feels comfortable Not wheezing  Objective   Blood pressure (!) 145/70,  pulse 75, temperature 98.5 F (36.9 C), resp. rate 18, SpO2 98 %.        Intake/Output Summary (Last 24 hours) at 12/26/2019 1416 Last data filed at 12/26/2019 8110 Gross per 24 hour  Intake --  Output 320 ml  Net -320 ml   There were no vitals filed for this visit.  Examination: General: Elderly lady, does not appear to be in distress HENT: Moist oral mucosa Lungs: Decreased air movement bilaterally Cardiovascular: S1-S2 appreciated Abdomen: Bowel sounds appreciated Extremities: No clubbing, no edema Neuro: Alert and oriented x3 GU:   Resolved Hospital Problem list     Assessment & Plan:  Hypoxic respiratory failure -Continue oxygen supplementation -May have undiagnosed obstructive lung disease  Abnormal CT scan of the chest -Spiculated lung nodules -Infiltrative process right upper lobe  Possible pneumonia -Treated with a course of doxycycline -Short course of steroids -Empiric bronchodilators  Possible obstructive lung disease -We will need PFT when more stable  Follow-up on echocardiogram Concern for pulmonary hypertension as a cause  Following adequate treatment of possible pneumonia, follow-up radiological studies to follow-up on infiltrative process and lung nodules will be appropriate She has no personal history of cancer Though did not smoke significantly in the past, was working in a [employer] with exposure to lot of dust and fumes  May require oxygen long-term  Will follow  Background history of hypertension Hypothyroidism Hyperlipidemia

## 2019-12-26 NOTE — ED Notes (Signed)
Yellow socks and falls band applied to pt.

## 2019-12-26 NOTE — ED Notes (Signed)
Pt oxygen dropped to 85% on 4L, increased to 6L, denies SOB

## 2019-12-26 NOTE — Evaluation (Signed)
Physical Therapy Evaluation Patient Details Name: [name] MRN: [id number] DOB: [birth year] Today's Date: 12/26/2019   History of Present Illness  Pt is an 79 y/o female admitted secondary to SOB and respiratory failure. Workup pending, however, may possibly have PNA vs obstructive lung disease. PMH includes HTN.   Clinical Impression  Pt admitted secondary to problem above with deficits below. Pt requiring min guard to stand and transfer to/from [facility]. After transfer, pt sats dropping to 80% on 2L with good waveform. Required 3L and extended time to return to 92-93%. No SOB noted. RN notified. Feel pt will progress well and will likely not require follow up at d/c. Will continue to follow acutely to maximize functional mobility independence and safety.     Follow Up Recommendations Other (comment) (TBD pending progression, likely no PT follow up )    Equipment Recommendations  None recommended by PT    Recommendations for Other Services       Precautions / Restrictions Precautions Precautions: Other (comment) Precaution Comments: watch O2 sats Restrictions Weight Bearing Restrictions: No      Mobility  Bed Mobility Overal bed mobility: Needs Assistance Bed Mobility: Supine to Sit;Sit to Supine     Supine to sit: Supervision Sit to supine: Supervision   General bed mobility comments: Supervision for safety.   Transfers Overall transfer level: Needs assistance Equipment used: None Transfers: Sit to/from [facility] Sit to Stand: Min guard Stand pivot transfers: Min guard       General transfer comment: Min guard for safety to stand and transfer to/from [facility] this session. Oxygen sats decreasing to 80% with good waveform on 2L and required 3L and return to supine to return to 92-93%. RN notified.   Ambulation/Gait                Stairs            Wheelchair Mobility    Modified Rankin (Stroke Patients Only)       Balance Overall  balance assessment: Needs assistance Sitting-balance support: No upper extremity supported;Feet supported Sitting balance-Leahy Scale: Good     Standing balance support: No upper extremity supported;During functional activity Standing balance-Leahy Scale: Fair                               Pertinent Vitals/Pain Pain Assessment: Faces Faces Pain Scale: Hurts even more Pain Location: L foot  Pain Descriptors / Indicators: Aching;Guarding;Grimacing Pain Intervention(s): Monitored during session;Limited activity within patient's tolerance;Repositioned    Home Living Family/patient expects to be discharged to:: Private residence Living Arrangements: Children Available Help at Discharge: Available PRN/intermittently;Family Type of Home: House Home Access: Level entry     Home Layout: One level Home Equipment: Shower seat - built in Additional Comments: Lives with her daughter    Prior Function Level of Independence: Independent               Journalist, newspaper        Extremity/Trunk Assessment   Upper Extremity Assessment Upper Extremity Assessment: Defer to OT evaluation    Lower Extremity Assessment Lower Extremity Assessment: LLE deficits/detail LLE Deficits / Details: Reports L foot pain     Cervical / Trunk Assessment Cervical / Trunk Assessment: Normal  Communication   Communication: No difficulties  Cognition Arousal/Alertness: Awake/alert Behavior During Therapy: WFL for tasks assessed/performed Overall Cognitive Status: Within Functional Limits for tasks assessed  General Comments      Exercises     Assessment/Plan    PT Assessment Patient needs continued PT services  PT Problem List Cardiopulmonary status limiting activity;Decreased mobility;Decreased activity tolerance;Decreased strength       PT Treatment Interventions Gait training;DME instruction;Functional mobility  training;Therapeutic activities;Therapeutic exercise;Balance training;Patient/family education    PT Goals (Current goals can be found in the Care Plan section)  Acute Rehab PT Goals Patient Stated Goal: to go home PT Goal Formulation: With patient Time For Goal Achievement: 01/09/20 Potential to Achieve Goals: Good    Frequency Min 3X/week   Barriers to discharge        Co-evaluation               AM-PAC PT "6 Clicks" Mobility  Outcome Measure Help needed turning from your back to your side while in a flat bed without using bedrails?: None Help needed moving from lying on your back to sitting on the side of a flat bed without using bedrails?: None Help needed moving to and from a bed to a chair (including a wheelchair)?: A Little Help needed standing up from a chair using your arms (e.g., wheelchair or bedside chair)?: A Little Help needed to walk in hospital room?: A Little Help needed climbing 3-5 steps with a railing? : A Lot 6 Click Score: 19    End of Session Equipment Utilized During Treatment: Oxygen Activity Tolerance: Treatment limited secondary to medical complications (Comment) (decreased oxygen sats) Patient left: in bed;with call bell/phone within reach;with family/visitor present (on stretcher) Nurse Communication: Other (comment);Mobility status (decreased oxygen sats ) PT Visit Diagnosis: Other abnormalities of gait and mobility (R26.89)    Time: 1544-1600 PT Time Calculation (min) (ACUTE ONLY): 16 min   Charges:   PT Evaluation $PT Eval Moderate Complexity: 1 Mod          Reuel Derby, PT, DPT  Acute Rehabilitation Services  Pager: [phone] Office: [phone]   Rudean Hitt 12/26/2019, 5:26 PM

## 2019-12-26 NOTE — ED Notes (Signed)
Pt ate some of the breakfast tray

## 2019-12-26 NOTE — ED Notes (Signed)
admitting at bedside.

## 2019-12-27 ENCOUNTER — Inpatient Hospital Stay (HOSPITAL_COMMUNITY): Payer: Medicare Other

## 2019-12-27 DIAGNOSIS — M7989 Other specified soft tissue disorders: Secondary | ICD-10-CM

## 2019-12-27 LAB — CBC
HCT: 45.4 % (ref 36.0–46.0)
Hemoglobin: 14.1 g/dL (ref 12.0–15.0)
MCH: 29.4 pg (ref 26.0–34.0)
MCHC: 31.1 g/dL (ref 30.0–36.0)
MCV: 94.8 fL (ref 80.0–100.0)
Platelets: 225 10*3/uL (ref 150–400)
RBC: 4.79 MIL/uL (ref 3.87–5.11)
RDW: 15.8 % — ABNORMAL HIGH (ref 11.5–15.5)
WBC: 8.5 10*3/uL (ref 4.0–10.5)
nRBC: 0 % (ref 0.0–0.2)

## 2019-12-27 LAB — BASIC METABOLIC PANEL
Anion gap: 11 (ref 5–15)
BUN: 14 mg/dL (ref 8–23)
CO2: 18 mmol/L — ABNORMAL LOW (ref 22–32)
Calcium: 9.9 mg/dL (ref 8.9–10.3)
Chloride: 110 mmol/L (ref 98–111)
Creatinine, Ser: 0.86 mg/dL (ref 0.44–1.00)
GFR calc Af Amer: >60 mL/min (ref >60)
GFR calc non Af Amer: >60 mL/min (ref >60)
Glucose, Bld: 124 mg/dL — ABNORMAL HIGH (ref 70–99)
Potassium: 4.8 mmol/L (ref 3.5–5.1)
Sodium: 139 mmol/L (ref 135–145)

## 2019-12-27 LAB — SEDIMENTATION RATE: Sed Rate: 46 mm/hr — ABNORMAL HIGH (ref 0–22)

## 2019-12-27 LAB — SJOGRENS SYNDROME-B EXTRACTABLE NUCLEAR ANTIBODY: SSB (La) (ENA) Antibody, IgG: <0.2 AI (ref 0.0–0.9)

## 2019-12-27 LAB — ANA: Anti Nuclear Antibody (ANA): NEGATIVE

## 2019-12-27 LAB — SJOGRENS SYNDROME-A EXTRACTABLE NUCLEAR ANTIBODY: SSA (Ro) (ENA) Antibody, IgG: <0.2 AI (ref 0.0–0.9)

## 2019-12-27 MED ORDER — POLYETHYLENE GLYCOL 3350 17 G PO PACK
17.0000 g | PACK | Freq: Every day | ORAL | Status: DC
  Administered 2019-12-27 – 2019-12-28 (×2): 17 g via ORAL
  Filled 2019-12-27 (×2): qty 1

## 2019-12-27 MED ORDER — IPRATROPIUM-ALBUTEROL 0.5-2.5 (3) MG/3ML IN SOLN
3.0000 mL | Freq: Three times a day (TID) | RESPIRATORY_TRACT | Status: DC
  Administered 2019-12-27: 3 mL via RESPIRATORY_TRACT
  Filled 2019-12-27: qty 3

## 2019-12-27 MED ORDER — IPRATROPIUM-ALBUTEROL 0.5-2.5 (3) MG/3ML IN SOLN: 3.0000 mL | Freq: Four times a day (QID) | RESPIRATORY_TRACT | Status: DC | PRN

## 2019-12-27 MED ORDER — TECHNETIUM TO 99M ALBUMIN AGGREGATED
4.2000 | Freq: Once | INTRAVENOUS | Status: AC | PRN
  Administered 2019-12-27: 4.2 via INTRAVENOUS

## 2019-12-27 NOTE — Progress Notes (Signed)
PROGRESS NOTE        PATIENT DETAILS Name: [name] Age: 79 y.o. Sex: female Date of Birth: 21-Apr-1940 Admit Date: 12/25/2019 Admitting Physician Evalee Mutton Kristeen Mans, MD [id number], Dalbert Batman, MD  Brief Narrative: Patient is a 79 y.o. female with history of subclavian artery stenosis, HTN, hypothyroidism-presented with 5-week history of worsening exertional dyspnea.  Found to be hypoxic with O2 sats in the low 80s on room air-requiring around 4 L of oxygen-subsequently admitted to the hospitalist service for further evaluation and treatment.  COVID-19 vaccination status: Unvaccinated  Significant events: 9/14>> presenting with exertional dyspnea-hypoxic-requiring O2 supplementation.  Admit to [facility].  Significant studies: 9/14>> CT chest: Spiculated 7 mm right upper lobe nodule, 5 mm pulmonary nodule-suggestion of several adjacent satellite micronodules.  Right upper lobe infiltrate with numerous mediastinal/hilar lymphadenopathy. 9/15>> echo: EF 60-65%, PA pressure 60.5 mmHg 9/16>> lower extremity Doppler: Negative for DVT 9/16>> VQ scan: Pending  Antimicrobial therapy: Doxycycline: 9/15>>  Microbiology data: 9/14>>SARS Coronavirus 2: Negative  Procedures : None  Consults: PCCM  DVT Prophylaxis : enoxaparin (LOVENOX) injection 40 mg Start: 12/25/19 2200   Subjective: Sleeping comfortably-no major issues overnight.  Claims she has some shortness of breath with ambulation yesterday.  Assessment/Plan: Acute hypoxic respiratory failure: Work-up in process-etiology could be moderate pulmonary hypertension.  She has had slowly progressive dyspnea over the past 4-5 weeks.  Awaiting VQ scan-we will await further recommendations from PCCM.  Will need to be evaluated for home O2 requirement prior to discharge.  PNA: Infiltrate seen on imaging studies-but really does not have a lot of symptoms of pneumonia.  Seen by PCCM and started on a course of  doxycycline/steroids.  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 Yes; Fluid consistency: Thin  Diet effective now                  Code Status: Full code  Family Communication: Daughter [name] [phone])-updated over the phone on 9/16  Disposition Plan: Status is: Inpatient  Remains inpatient appropriate because:Inpatient level of care appropriate due to severity of illness   Dispo: The patient is from: Home              Anticipated d/c is to: Home              Anticipated d/c date is: 2 days              Patient currently is not medically stable to d/c.    Barriers to Discharge: Hypoxia requiring O2 supplementation-awaiting further work-up to determine etiology of hypoxemia.  Antimicrobial agents: Anti-infectives (From admission, onward)   Start     Dose/Rate Route Frequency Ordered Stop   12/26/19 1845  doxycycline (VIBRA-TABS) tablet 100 mg        100 mg Oral Every 12 hours 12/26/19 1844     12/26/19 1530  doxycycline (VIBRA-TABS) tablet 100 mg  Status:  Discontinued        100 mg Oral Every 12 hours 12/26/19 1423 12/26/19 1844  Time spent: 25 minutes-Greater than 50% of this time was spent in counseling, explanation of diagnosis, planning of further management, and coordination of care.  MEDICATIONS: Scheduled Meds: . amLODipine  5 mg Oral Daily  . aspirin  81 mg Oral Daily  . clopidogrel  75 mg Oral Daily  . doxycycline  100 mg Oral Q12H  . enoxaparin (LOVENOX) injection  40 mg Subcutaneous Q24H  . gabapentin  100 mg Oral QHS  . ipratropium-albuterol  3 mL Inhalation TID  . latanoprost  1 drop Both  Eyes QHS  . levothyroxine  50 mcg Oral QAC breakfast  . lisinopril  20 mg Oral Daily  . metoprolol succinate  25 mg Oral Daily  . predniSONE  30 mg Oral Q breakfast  . senna  1 tablet Oral BID  . simvastatin  20 mg Oral QHS  . sodium chloride flush  3 mL Intravenous Q12H   Continuous Infusions: . sodium chloride     PRN Meds:.sodium chloride, acetaminophen **OR** acetaminophen, hydroxypropyl methylcellulose / hypromellose, sodium chloride flush   PHYSICAL EXAM: Vital signs: Vitals:   12/27/19 0649 12/27/19 0727 12/27/19 0852 12/27/19 1119  BP: 133/66 (!) 122/58  104/60  Pulse: 70 72  69  Resp: _0 Temp: 98.2 F (36.8 C) 98.5 F (36.9 C)  97.9 F (36.6 C)  TempSrc: Oral Oral  Oral  SpO2: 93% 95% 96% 93%  Weight: 53.4 kg     Height:       Filed Weights   12/26/19 2200 12/27/19 0649  Weight: 53.9 kg 53.4 kg   Body mass index is 20.87 kg/m.   Gen Exam:Alert awake-not in any distress HEENT:atraumatic, normocephalic Chest: B/L clear to auscultation anteriorly CVS:S1S2 regular Abdomen:soft non tender, non distended Extremities:no edema Neurology: Non focal Skin: no rash  I have personally reviewed following labs and imaging studies  LABORATORY DATA: CBC: Recent Labs  Lab 12/25/19 1517 12/25/19 2218 12/27/19 0831  WBC 7.7  --  8.5  HGB 12.4 13.6 14.1  HCT 40.0 40.0 45.4  MCV 96.2  --  94.8  PLT 215  --  888    Basic Metabolic Panel: Recent Labs  Lab 12/25/19 1517 12/25/19 2218 12/27/19 0831  NA 139 144 139  K 3.7 3.6 4.8  CL 110  --  110  CO2 19*  --  18*  GLUCOSE 105*  --  124*  BUN 14  --  14  CREATININE 0.91  --  0.86  CALCIUM 9.2  --  9.9    GFR: Estimated Creatinine Clearance: 42.4 mL/min (by C-G formula based on SCr of 0.86 mg/dL).  Liver Function Tests: No results for input(s): AST, ALT, ALKPHOS, BILITOT, PROT, ALBUMIN in the last 168 hours. No results for input(s): LIPASE, AMYLASE in the last 168 hours. No results for  input(s): AMMONIA in the last 168 hours.  Coagulation Profile: Recent Labs  Lab 12/25/19 1517  INR 1.1    Cardiac Enzymes: Recent Labs  Lab 12/26/19 0709  CKTOTAL 101    BNP (last 3 results) No results for input(s): PROBNP in the last 8760 hours.  Lipid Profile: No results for input(s): CHOL, HDL, LDLCALC, TRIG, CHOLHDL, LDLDIRECT in the last 72 hours.  Thyroid Function Tests: Recent Labs    12/26/19 0709  TSH 4.257    Anemia Panel: No results for input(s): VITAMINB12, FOLATE, FERRITIN, TIBC, IRON, RETICCTPCT in the last 72 hours.  Urine analysis:    Component Value Date/Time   COLORURINE  YELLOW 09/23/2015 0012   APPEARANCEUR CLEAR 09/23/2015 0012   LABSPEC 1.015 11/25/2019 1137   PHURINE 6.5 11/25/2019 1137   GLUCOSEU NEGATIVE 11/25/2019 1137   HGBUR NEGATIVE 11/25/2019 1137   BILIRUBINUR NEGATIVE 11/25/2019 1137   KETONESUR NEGATIVE 11/25/2019 1137   PROTEINUR NEGATIVE 11/25/2019 1137   UROBILINOGEN 0.2 11/25/2019 1137   NITRITE NEGATIVE 11/25/2019 1137   LEUKOCYTESUR NEGATIVE 11/25/2019 1137    Sepsis Labs: Lactic Acid, Venous No results found for: LATICACIDVEN  MICROBIOLOGY: Recent Results (from the past 240 hour(s))  SARS Coronavirus 2 by RT PCR (hospital order, performed in [place] hospital lab) Nasopharyngeal Nasopharyngeal Swab     Status: None   Collection Time: 12/25/19  6:52 PM   Specimen: Nasopharyngeal Swab  Result Value Ref Range Status   SARS Coronavirus 2 NEGATIVE NEGATIVE Final    Comment: (NOTE) SARS-CoV-2 target nucleic acids are NOT DETECTED.  The SARS-CoV-2 RNA is generally detectable in upper and lower respiratory specimens during the acute phase of infection. The lowest concentration of SARS-CoV-2 viral copies this assay can detect is 250 copies / mL. A negative result does not preclude SARS-CoV-2 infection and should not be used as the sole basis for treatment or other patient management decisions.  A negative result may  occur with improper specimen collection / handling, submission of specimen other than nasopharyngeal swab, presence of viral mutation(s) within the areas targeted by this assay, and inadequate number of viral copies (<250 copies / mL). A negative result must be combined with clinical observations, patient history, and epidemiological information.  Fact Sheet for Patients:   StrictlyIdeas.no  Fact Sheet for Healthcare Providers: BankingDealers.co.za  This test is not yet approved or  cleared by the [place] FDA and has been authorized for detection and/or diagnosis of SARS-CoV-2 by FDA under an Emergency Use Authorization (EUA).  This EUA will remain in effect (meaning this test can be used) for the duration of the COVID-19 declaration under Section 564(b)(1) of the Act, 21 U.S.C. section 360bbb-3(b)(1), unless the authorization is terminated or revoked sooner.  Performed at [facility], [place] [address] [place], [place] [address]     RADIOLOGY STUDIES/RESULTS: DG Chest 2 View  Result Date: 12/25/2019 CLINICAL DATA:  Shortness of breath with exertion for approximately 6-7 months EXAM: CHEST - 2 VIEW COMPARISON:  Radiograph 12/04/2019 FINDINGS: There is chronic hyperinflation. Some coarsened interstitial changes are present in both lungs which are increased from comparison radiography particularly towards the lung bases. Some mild bronchitic changes are noted as well. Small nodular, likely calcific radiodensity is seen along the right paratracheal stripe, likely present on comparison. Vascular stent noted in the suspected location of the brachiocephalic vein. The aorta is calcified. The remaining cardiomediastinal contours are unremarkable. No acute osseous or soft tissue abnormality. Degenerative changes are present in the imaged spine and shoulders. Features of bilateral calcific tendinosis of the shoulders. IMPRESSION: 1.  Diffusely coarsened interstitial changes throughout the lungs may reflect some developing interstitial lung disease or atypical infection. 2. Chronic hyperinflation. 3. Likely small pleural plaque or calcified granuloma in the right upper lobe. 4.  Aortic Atherosclerosis (ICD10-I70.0). Electronically Signed   By: Lovena Le M.D.   On: 12/25/2019 16:05   CT CHEST W CONTRAST  Result Date: 12/25/2019 CLINICAL DATA:  Respiratory failure, hypertension, GERD, carotid artery occlusion, carotid subclavian anastomosis, carotid stent insertion EXAM: CT CHEST WITH CONTRAST TECHNIQUE: Multidetector CT imaging of the chest was performed during intravenous contrast administration. CONTRAST:  31m  OMNIPAQUE IOHEXOL 300 MG/ML  SOLN COMPARISON:  Chest x-ray 12/25/2019 FINDINGS: Cardiovascular: Normal heart size. No significant pericardial effusion. The thoracic aorta is normal in caliber. At least moderate calcified and noncalcified atherosclerotic plaque of the thoracic aorta. Right common carotid origin stent placement noted with patency evaluation limited due to streak artifact originating from adjacent venous structures containing intravenous contrast. At least mild to moderate four-vessel coronary artery calcifications. The main pulmonary artery is borderline enlarged measuring up to 3 cm. No central or segmental pulmonary embolus. Mediastinum/Nodes: Multiple enlarged mediastinal and bilateral hilar lymph nodes with as an example a 1.1 cm right paratracheal (3:47), 1.3 cm left paratracheal (3:53), 1 cm subcarinal (3:67), 1 cm right hilar (3:72), 1 cm left hilar, 3:77) Lymph nodes. Prominent paraesophageal and retrocrural lymph nodes open (3:58, 131). No enlarged axillary lymph nodes. Thyroid gland, trachea, and esophagus demonstrate no significant findings. Lungs/Pleura: Spiculated 7 mm right upper lobe pulmonary nodule (5:39). 5 mm pulmonary nodule posteriorly (5:39). Suggestion of several adjacent satellite micronodules  within the peripheral right upper lobe open (5:30-46). Pulmonary micronodule within the left upper lobe that is ground-glass in density (5:47). Scattered centrilobular micronodules within the left upper lobe. Diffuse mild interlobular septal wall thickening. Mild diffuse bronchial wall thickening. No pleural effusion or pneumothorax. Upper Abdomen: Fluid-fluid level within the gastric lumen likely related to ingested material. Interval decrease in size of a now punctate calcification within the common bile duct open (6:40). Otherwise no acute abnormality Musculoskeletal: No chest wall abnormality No suspicious lytic or blastic osseous lesions. No acute displaced fracture. Multilevel degenerative changes of the spine. IMPRESSION: 1. Right upper lobe atypical pneumonia with likely reactive mediastinal and hilar lymphadenopathy. Recommend repeat CT in 6-8 weeks status post treatment for infection to exclude an underlying malignancy. 2. A 4 mm and spiculated 7 mm right upper lobe nodule. Non-contrast chest CT at 3-6 months is recommended. If the nodules are stable at time of repeat CT, then future CT at 18-24 months (from today's scan) is considered optional for low-risk patients, but is recommended for high-risk patients. This recommendation follows the consensus statement: Guidelines for Management of Incidental Pulmonary Nodules Detected on CT Images: From the Fleischner Society 2017; Radiology 2017; 284:228-243. 3.  Aortic Atherosclerosis (ICD10-I70.0). Electronically Signed   By: Iven Finn M.D.   On: 12/25/2019 23:48   ECHOCARDIOGRAM COMPLETE  Result Date: 12/26/2019    ECHOCARDIOGRAM REPORT   Patient Name:   ALIECE HONOLD Date of Exam: 12/26/2019 Medical Rec #:  [id number]       Height:       62.0 in Accession #:    [id number]      Weight:       133.0 lb Date of Birth:  Aug 22, 1940       BSA:          1.607 m Patient Age:    66 years        BP:           148/60 mmHg Patient Gender: F               HR:            75 bpm. Exam Location:  Inpatient Procedure: 2D Echo, Cardiac Doppler and Color Doppler Indications:    R06.02 SOB  History:        Patient has prior history of Echocardiogram examinations. Risk                 Factors:Hypertension, Former  Smoker and Dyslipidemia.  Sonographer:    Bernadene Person RDCS Referring Phys: [place]  1. Left ventricular ejection fraction, by estimation, is 60 to 65%. The left ventricle has normal function. Left ventricular endocardial border not optimally defined to evaluate regional wall motion. Left ventricular diastolic parameters are indeterminate.  2. Right ventricular systolic function is normal. The right ventricular size is normal. Moderately increased right ventricular wall thickness. There is moderately elevated pulmonary artery systolic pressure. The estimated right ventricular systolic pressure is 17.7 mmHg.  3. The mitral valve is normal in structure. Trivial mitral valve regurgitation. No evidence of mitral stenosis.  4. The aortic valve is tricuspid. Aortic valve regurgitation is not visualized. No aortic stenosis is present. FINDINGS  Left Ventricle: Left ventricular ejection fraction, by estimation, is 60 to 65%. The left ventricle has normal function. Left ventricular endocardial border not optimally defined to evaluate regional wall motion. The left ventricular internal cavity size was normal in size. There is no left ventricular hypertrophy. Left ventricular diastolic parameters are indeterminate. Right Ventricle: The right ventricular size is normal. Moderately increased right ventricular wall thickness. Right ventricular systolic function is normal. There is moderately elevated pulmonary artery systolic pressure. The tricuspid regurgitant velocity is 3.79 m/s, and with an assumed right atrial pressure of 3 mmHg, the estimated right ventricular systolic pressure is 93.9 mmHg. Left Atrium: Left atrial size was normal in size. Right Atrium:  Right atrial size was normal in size. Pericardium: There is no evidence of pericardial effusion. Mitral Valve: The mitral valve is normal in structure. Trivial mitral valve regurgitation. No evidence of mitral valve stenosis. Tricuspid Valve: The tricuspid valve is normal in structure. Tricuspid valve regurgitation is mild. Aortic Valve: The aortic valve is tricuspid. Aortic valve regurgitation is not visualized. No aortic stenosis is present. Pulmonic Valve: The pulmonic valve was grossly normal. Pulmonic valve regurgitation is mild. No evidence of pulmonic stenosis. Aorta: The aortic root and ascending aorta are structurally normal, with no evidence of dilitation. IAS/Shunts: The atrial septum is grossly normal.  LEFT VENTRICLE PLAX 2D LVIDd:         3.90 cm  Diastology LVIDs:         2.70 cm  LV e' medial:    6.85 cm/s LV PW:         0.80 cm  LV E/e' medial:  10.1 LV IVS:        0.90 cm  LV e' lateral:   9.57 cm/s LVOT diam:     2.00 cm  LV E/e' lateral: 7.3 LV SV:         68 LV SV Index:   42 LVOT Area:     3.14 cm  RIGHT VENTRICLE RV S prime:     9.68 cm/s TAPSE (M-mode): 1.6 cm LEFT ATRIUM             Index       RIGHT ATRIUM           Index LA diam:        2.60 cm 1.62 cm/m  RA Area:     13.10 cm LA Vol (A2C):   26.7 ml 16.61 ml/m RA Volume:   28.20 ml  17.54 ml/m LA Vol (A4C):   25.5 ml 15.86 ml/m LA Biplane Vol: 25.8 ml 16.05 ml/m  AORTIC VALVE LVOT Vmax:   87.50 cm/s LVOT Vmean:  65.000 cm/s LVOT VTI:    0.217 m  AORTA Ao Root diam: 3.30 cm  Ao Asc diam:  3.40 cm MITRAL VALVE               TRICUSPID VALVE MV Area (PHT): 2.91 cm    TR Peak grad:   57.5 mmHg MV Decel Time: 261 msec    TR Vmax:        379.00 cm/s MV E velocity: 69.50 cm/s MV A velocity: 85.90 cm/s  SHUNTS MV E/A ratio:  0.81        Systemic VTI:  0.22 m                            Systemic Diam: 2.00 cm Mertie Moores MD Electronically signed by Mertie Moores MD Signature Date/Time: 12/26/2019/2:08:04 PM    Final    VAS [place] LOWER  EXTREMITY VENOUS (DVT)  Result Date: 12/27/2019  Lower Venous DVT Study Indications: Swelling.  Risk Factors: Pulmonary hypertension. Performing Technologist: Sharion Dove RVS  Examination Guidelines: A complete evaluation includes B-mode imaging, spectral Doppler, color Doppler, and power Doppler as needed of all accessible portions of each vessel. Bilateral testing is considered an integral part of a complete examination. Limited examinations for reoccurring indications may be performed as noted. The reflux portion of the exam is performed with the patient in reverse Trendelenburg.  +---------+---------------+---------+-----------+----------+--------------+ RIGHT    CompressibilityPhasicitySpontaneityPropertiesThrombus Aging +---------+---------------+---------+-----------+----------+--------------+ CFV      Full           Yes      Yes                                 +---------+---------------+---------+-----------+----------+--------------+ SFJ      Full                                                        +---------+---------------+---------+-----------+----------+--------------+ FV Prox  Full                                                        +---------+---------------+---------+-----------+----------+--------------+ FV Mid   Full                                                        +---------+---------------+---------+-----------+----------+--------------+ FV DistalFull                                                        +---------+---------------+---------+-----------+----------+--------------+ PFV      Full                                                        +---------+---------------+---------+-----------+----------+--------------+ POP  Full           Yes      Yes                                 +---------+---------------+---------+-----------+----------+--------------+ PTV      Full                                                         +---------+---------------+---------+-----------+----------+--------------+ PERO     Full                                                        +---------+---------------+---------+-----------+----------+--------------+   +---------+---------------+---------+-----------+----------+--------------+ LEFT     CompressibilityPhasicitySpontaneityPropertiesThrombus Aging +---------+---------------+---------+-----------+----------+--------------+ CFV      Full           Yes      Yes                                 +---------+---------------+---------+-----------+----------+--------------+ SFJ      Full                                                        +---------+---------------+---------+-----------+----------+--------------+ FV Prox  Full                                                        +---------+---------------+---------+-----------+----------+--------------+ FV Mid   Full                                                        +---------+---------------+---------+-----------+----------+--------------+ FV DistalFull                                                        +---------+---------------+---------+-----------+----------+--------------+ PFV      Full                                                        +---------+---------------+---------+-----------+----------+--------------+ POP      Full           Yes      Yes                                 +---------+---------------+---------+-----------+----------+--------------+  PTV      Full                                                        +---------+---------------+---------+-----------+----------+--------------+ PERO     Full                                                        +---------+---------------+---------+-----------+----------+--------------+     Summary: BILATERAL: - No evidence of deep vein thrombosis seen in the lower extremities, bilaterally. -    *See table(s) above for measurements and observations.    Preliminary      LOS: 2 days   Oren Binet, MD  [facility]    To contact the attending provider between 7A-7P or the covering provider during after hours 7P-7A, please log into the web site [email or web address] and access using universal [place] password for that web site. If you do not have the password, please call the hospital operator.  12/27/2019, 12:31 PM

## 2019-12-27 NOTE — Progress Notes (Signed)
SATURATION QUALIFICATIONS: (This note is used to comply with regulatory documentation for home oxygen)  Patient Saturations on Room Air at Rest = 90%  Patient Saturations on Room Air while Ambulating = 80%  Patient Saturations on 3 Liters of oxygen while Ambulating = 93%  Please briefly explain why patient needs home oxygen: pt oxygen saturation dropped to 80% while ambulating, pt had to be placed on 2L. Her oxygen sat up to 91% with 2L and 3L for 93% oxygen sat.

## 2019-12-27 NOTE — Progress Notes (Signed)
SATURATION QUALIFICATIONS: (This note is used to comply with regulatory documentation for home oxygen)  Patient Saturations on Room Air at Rest = 93%  Patient Saturations on Room Air while sitting EOB exercising = 83%  Patient Saturations on 3 Liters of oxygen while Ambulating = 91%  Please briefly explain why patient needs home oxygen Pt O2 dropping during sitting EOB exercises; pt requiring O2 3L to increase oxygen levels during activity  Lyanne Co, DPT Acute Rehabilitation Services [phone]

## 2019-12-27 NOTE — Progress Notes (Signed)
NAME:  [name], MRN:  [id number], DOB:  [birth year], LOS: 2 ADMISSION DATE:  12/25/2019, CONSULTATION DATE:  12/26/2019 REFERRING MD:  Sloan Leiter, CHIEF COMPLAINT:  SOB   Brief History   Patient with shortness of breath of about 6 weeks duration She was treated with a course of antibiotics at the onset of the illness Did improve a little bit Continues to be short of breath Daily cough with mucus production Has not had any fevers or chills  History of present illness   Presented to the hospital this time around secondary to more noticeable symptoms of shortness of breath Has not felt acutely ill No fevers, no chills, no weight loss, preserved appetite  Past Medical History   Past Medical History:  Diagnosis Date  . Allergy   . Arthritis   . Carotid artery occlusion   . GERD (gastroesophageal reflux disease)   . Hypertension   . Joint pain   . Thyroid disease     Consults:  Pulmonary  Procedures:  None  Significant Diagnostic Tests:  CT chest  IMPRESSION: 1. Right upper lobe atypical pneumonia with likely reactive mediastinal and hilar lymphadenopathy. Recommend repeat CT in 6-8 weeks status post treatment for infection to exclude an underlying malignancy. 2. A 4 mm and spiculated 7 mm right upper lobe nodule. Non-contrast chest CT at 3-6 months is recommended. If the nodules are stable at time of repeat CT, then future CT at 18-24 months (from today's scan) is considered optional for low-risk patients, but is recommended for high-risk patients. This recommendation follows the consensus statement: Guidelines for Management of Incidental Pulmonary Nodules Detected on CT Images: From the Fleischner Society 2017; Radiology 2017; 284:228-243. 3.  Aortic Atherosclerosis (ICD10-I70.0).  ECHO> LVEF 60-65%. Normal RV systolic function, moderately increased RV wall thickness. Moderately elevated pulmonary artery systolic pressure, estimated RVSP 60.31mHg  Micro  Data:  Coronavirus negative  Antimicrobials:  None  Interim history/subjective:  Feels comfortable Would like to have a BM and is asking if there are medications available to help with this   Objective   Blood pressure 104/60, pulse 69, temperature 97.9 F (36.6 C), temperature source Oral, resp. rate 16, height _0  (1.6 m), weight 53.4 kg, SpO2 93 %.        Intake/Output Summary (Last 24 hours) at 12/27/2019 1147 Last data filed at 12/27/2019 0655 Gross per 24 hour  Intake 240 ml  Output 500 ml  Net -260 ml   Filed Weights   12/26/19 2200 12/27/19 0649  Weight: 53.9 kg 53.4 kg    Examination: General: WNWD elderly F, reclined in bed NAD  HENT: NCAT nasal cannula in place  Lungs: Decreased air movement. 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

## 2019-12-27 NOTE — TOC Initial Note (Signed)
Transition of Care [facility]) - Initial/Assessment Note    Patient Details  Name: [name] MRN: [id number] Date of Birth: 1940/08/24  [facility] CM/SW Contact:    Sherrilyn Rist Transition of Care Supervisor Phone Number:[phone] 12/27/2019, 1:19 PM  Clinical Narrative:                 Talked to patient at the beside; PQZ:RAQTMAU, Dalbert Batman, MD; has private insurance with Medicare  Medicaid with prescription drug coverage; [facility] [address]; lives at home with daughter [name]; No DME; patient is refusing all [place] services at this time. Patient stated " I exercise at home and lift weights." CM explained the added benefit of HHC, pt refused; Home oxygen ordered through [employer]. Betsy with [employer] called for arrangements. TOC will continue to follow for progression of care.     Expected Discharge Plan: [place] - patient refused Barriers to Discharge: No Barriers Identified   Patient Goals and CMS Choice Patient states their goals for this hospitalization and ongoing recovery are:: to get out of the hospital and stay home CMS Medicare.gov Compare Post Acute Care list provided to:: Patient Choice offered to / list presented to : Patient  Expected Discharge Plan and Services Expected Discharge Plan: [place] but patient refused [facility] In-house Referral: NA Discharge Planning Services: CM Consult   Living arrangements for the past 2 months: Single Family Home                 DME Arranged: Oxygen DME Agency: [employer] Date DME Agency Contacted: 12/27/19 Time DME Agency Contacted: 14 Representative spoke with at DME Agency: [facility] Arranged: Refused HH          Prior Living Arrangements/Services Living arrangements for the past 2 months: [place] Lives with:: Adult Children Patient language and need for interpreter reviewed:: No Do you feel safe going back to the place where you  live?: Yes      Need for Family Participation in Patient Care: No (Comment) Care giver support system in place?: Yes (comment) (Daughter very supportive)   Criminal Activity/Legal Involvement Pertinent to Current Situation/Hospitalization: No - Comment as needed  Activities of Daily Living Home Assistive Devices/Equipment: Shower chair without back ADL Screening (condition at time of admission) Patient's cognitive ability adequate to safely complete daily activities?: Yes Is the patient deaf or have difficulty hearing?: No Does the patient have difficulty seeing, even when wearing glasses/contacts?: No Does the patient have difficulty concentrating, remembering, or making decisions?: No Patient able to express need for assistance with ADLs?: Yes Does the patient have difficulty dressing or bathing?: No Independently performs ADLs?: Yes (appropriate for developmental age) Does the patient have difficulty walking or climbing stairs?: No Weakness of Legs: None Weakness of Arms/Hands: None  Permission Sought/Granted Permission sought to share information with : Case Manager Permission granted to share information with : Yes, Verbal Permission Granted  Share Information with NAME: [name]- daughter  Permission granted to share info w AGENCY: DME        Emotional Assessment Appearance:: Appears stated age Attitude/Demeanor/Rapport: Gracious Affect (typically observed): Accepting Orientation: : Oriented to Self, Oriented to Place, Oriented to  Time, Oriented to Situation   Psych Involvement: No (comment)  Admission diagnosis:  SOB (shortness of breath) [R06.02] Acute respiratory failure with hypoxia (HCC) [J96.01] Acute respiratory failure with hypoxemia ([place]) [J96.01] Patient Active Problem List   Diagnosis Date Noted  .  SOB (shortness of breath) 12/26/2019  . Acute on chronic respiratory failure with hypoxia ([place]) 12/25/2019  . Acute respiratory failure with hypoxemia ([place])  12/25/2019  . Essential hypertension 06/06/2017  . Hyperlipidemia 06/06/2017  . Leg cramps 06/06/2017  . Vertigo 06/06/2017  . Macular degeneration 06/06/2017  . Hypothyroidism 06/06/2017  . Carotid stenosis 01/22/2014  . Aftercare following surgery of the circulatory system, [place] 01/02/2013  . Occlusion and stenosis of carotid artery without mention of cerebral infarction 04/27/2011   PCP:  [name], MD Pharmacy[facility]   [id number] [place] [place]Phone: [phone]: [phone]    Social Determinants of Health (SDOH) Interventions    Readmission Risk Interventions No flowsheet data found.

## 2019-12-27 NOTE — Evaluation (Signed)
Occupational Therapy Evaluation Patient Details Name: [name] MRN: [id number] DOB: Dec 14, 1940 Today's Date: 12/27/2019    History of Present Illness Pt is an 79 y/o female admitted secondary to SOB and respiratory failure. Workup pending, however, may possibly have PNA vs obstructive lung disease. PMH includes HTN.    Clinical Impression   Pt admitted with the above diagnoses and presents with below problem list. Pt will benefit from continued acute OT to address the below listed deficits and maximize independence with basic ADLs prior to d/c home. PTA pt was independent with ADLs. Pt currently min guard with LB ADLs and functional mobility/transfers. Pt received on 2L O2 via [place] with sats at 99 sitting EOB. Pt walked EOB <>door 2x at min guard level on RA (baseline). Sats noted to be 74 once sitting back down. Reapplied [place] at 2L and gave cues for breathing technique. O2 a little sluggish to recover but by 2 minutes O2 was 91-93. Pt denied lightheadedness/dizziness throughout session though noted to visually appear a bit dizzy walking in the room. Daughter endorses pt h/o vertigo. Left on 2L.       Follow Up Recommendations  Home health OT;Supervision - Intermittent    Equipment Recommendations  None recommended by OT    Recommendations for Other Services       Precautions / Restrictions Precautions Precautions: Other (comment) Precaution Comments: watch O2 sats Restrictions Weight Bearing Restrictions: No      Mobility Bed Mobility Overal bed mobility: Needs Assistance Bed Mobility: Supine to Sit;Sit to Supine     Supine to sit: Supervision Sit to supine: Supervision   General bed mobility comments: Supervision for safety.   Transfers Overall transfer level: Needs assistance Equipment used: None Transfers: Sit to/from Stand Sit to Stand: Min guard         General transfer comment: min guard for safety. Appeared dizzy/lightheaded in standing but pt denied  stating "I feel fine" H/o vertigo per daughter    Balance Overall balance assessment: Needs assistance Sitting-balance support: No upper extremity supported;Feet supported Sitting balance-Leahy Scale: Good     Standing balance support: No upper extremity supported;During functional activity Standing balance-Leahy Scale: Fair                             ADL either performed or assessed with clinical judgement   ADL Overall ADL's : Needs assistance/impaired Eating/Feeding: Set up;Sitting   Grooming: Standing;Min guard   Upper Body Bathing: Set up;Sitting   Lower Body Bathing: Min guard;Sit to/from stand   Upper Body Dressing : Set up;Sitting   Lower Body Dressing: Min guard;Sit to/from stand   Toilet Transfer: Min guard   [place] and Hygiene: Min guard;Set up;Sitting/lateral lean;Sit to/from stand   Tub/ Shower Transfer: Walk-in shower;Min guard   Functional mobility during ADLs: Min guard (in room mobility; EOB<>door 2x) General ADL Comments: Pt completed bed mobility and in room functional mobility (bathroom distance). Trialed on RA with pt sats dropping to 74 post inroom mobility. Reapplied 2L O2 via [place].      Vision         Perception     Praxis      Pertinent Vitals/Pain Pain Assessment: No/denies pain     Hand Dominance     Extremity/Trunk Assessment Upper Extremity Assessment Upper Extremity Assessment: Overall WFL for tasks assessed;Generalized weakness   Lower Extremity Assessment Lower Extremity Assessment: Defer to PT evaluation   Cervical /  Trunk Assessment Cervical / Trunk Assessment: Normal   Communication Communication Communication: No difficulties   Cognition Arousal/Alertness: Awake/alert Behavior During Therapy: WFL for tasks assessed/performed Overall Cognitive Status: Within Functional Limits for tasks assessed                                     General Comments        Exercises     Shoulder Instructions      Home Living Family/patient expects to be discharged to:: Private residence Living Arrangements: Children Available Help at Discharge: Available PRN/intermittently;Family Type of Home: House Home Access: Level entry     Home Layout: One level     Bathroom Shower/Tub: Occupational psychologist: Standard     Home Equipment: Shower seat - built in   Additional Comments: Lives with her daughter      Prior Functioning/Environment Level of Independence: Independent                 OT Problem List: Decreased activity tolerance;Impaired balance (sitting and/or standing);Decreased knowledge of use of DME or AE;Decreased knowledge of precautions;Cardiopulmonary status limiting activity      OT Treatment/Interventions: Self-care/ADL training;Energy conservation;DME and/or AE instruction;Therapeutic activities;Patient/family education;Balance training;Therapeutic exercise    OT Goals(Current goals can be found in the care plan section) Acute Rehab OT Goals Patient Stated Goal: to go home OT Goal Formulation: With patient Time For Goal Achievement: 01/10/20 Potential to Achieve Goals: Good ADL Goals Pt Will Perform Grooming: with modified independence;standing Pt Will Perform Lower Body Bathing: with modified independence;sit to/from stand Pt Will Perform Lower Body Dressing: with modified independence;sit to/from stand Pt Will Transfer to Toilet: with modified independence;ambulating Pt Will Perform Toileting - Clothing Manipulation and hygiene: with modified independence;sit to/from stand  OT Frequency: Min 2X/week   Barriers to D/C:            Co-evaluation              AM-PAC OT "6 Clicks" Daily Activity     Outcome Measure Help from another person eating meals?: None Help from another person taking care of personal grooming?: None Help from another person toileting, which includes using toliet, bedpan, or  urinal?: None Help from another person bathing (including washing, rinsing, drying)?: A Little Help from another person to put on and taking off regular upper body clothing?: None Help from another person to put on and taking off regular lower body clothing?: None 6 Click Score: 23   End of Session Equipment Utilized During Treatment: Oxygen (2L at start and end of session) Nurse Communication: Other (comment) (O2 sats, failed trial on RA, back on 2L O2)  Activity Tolerance: Patient tolerated treatment well;Patient limited by fatigue;Other (comment) (Sats dropped to 74 on RA with in room mobility) Patient left: in bed;with call bell/phone within reach;with family/visitor present;Other (comment) (with Vascular)  OT Visit Diagnosis: Unsteadiness on feet (R26.81);Muscle weakness (generalized) (M62.81)                Time: 9024-0973 OT Time Calculation (min): 16 min Charges:  OT General Charges $OT Visit: 1 Visit OT Evaluation $OT Eval Low Complexity: [place], OT Acute Rehabilitation Services Pager: [phone] Office: [phone]   Hortencia Pilar 12/27/2019, 11:49 AM

## 2019-12-27 NOTE — Progress Notes (Signed)
Physical Therapy Treatment Patient Details Name: [name] MRN: [id number] DOB: October 06, 1940 Today's Date: 12/27/2019    History of Present Illness Pt is an 79 y/o female admitted secondary to SOB and respiratory failure. Workup pending, however, may possibly have PNA vs obstructive lung disease. PMH includes HTN.     PT Comments    Pt fully participated in session. pt demonstrating improved standing static and dynamic balance. Pt continues to demonstrate mild balance deficits during high level balance activities during ambulation requiring min guard assist; Pt also demonstrating endurance deficits during session. Pt will benefit from continued skilled PT intervention to address deficits and maximize independence with functional mobility prior to discharge. O2 assessment: pt supine in bed with no O2 93%, upon sitting without O2 91%; seated knee extension without O2 sats dropped to 83%, 2 L O2 donned through [place], sats increased to 88%, upon standing with ambulation O2 on 2 L dropped to 86%, O2 increase to 3L and O2 at 91% during ambulation   Follow Up Recommendations  No PT follow up     Equipment Recommendations  None recommended by PT    Recommendations for Other Services       Precautions / Restrictions Precautions Precautions: Other (comment) Precaution Comments: watch O2 sats Restrictions Weight Bearing Restrictions: No    Mobility  Bed Mobility Overal bed mobility: Modified Independent Bed Mobility: Supine to Sit;Sit to Supine     Supine to sit: Modified independent (Device/Increase time) Sit to supine: Modified independent (Device/Increase time)   General bed mobility comments: Supervision for safety.   Transfers Overall transfer level: Needs assistance Equipment used: None Transfers: Sit to/from Stand Sit to Stand: Supervision         General transfer comment: S for safety on initial sit>stand  Ambulation/Gait Ambulation/Gait assistance: Supervision;Min  guard Gait Distance (Feet): 100 Feet Assistive device: None       General Gait Details: pt with varying gait velocity, narrow BOS   Stairs             Wheelchair Mobility    Modified Rankin (Stroke Patients Only)       Balance Overall balance assessment: Needs assistance Sitting-balance support: No upper extremity supported;Feet supported Sitting balance-Leahy Scale: Good     Standing balance support: No upper extremity supported;During functional activity Standing balance-Leahy Scale: Fair Standing balance comment: performed head movement R/L/up/down during ambulation wtih intermittent min guard needed for balance; supervision needed when therapist cued pt to increase and decrease velocity                            Cognition Arousal/Alertness: Awake/alert Behavior During Therapy: WFL for tasks assessed/performed Overall Cognitive Status: Within Functional Limits for tasks assessed                                        Exercises      General Comments        Pertinent Vitals/Pain Pain Assessment: No/denies pain    Home Living Family/patient expects to be discharged to:: Private residence Living Arrangements: Children Available Help at Discharge: Available PRN/intermittently;Family Type of Home: House Home Access: Level entry   Home Layout: One level Home Equipment: Shower seat - built in Additional Comments: Lives with her daughter    Prior Function Level of Independence: Independent  PT Goals (current goals can now be found in the [name] plan section) Acute Rehab PT Goals Patient Stated Goal: to go home PT Goal Formulation: With patient Time For Goal Achievement: 01/09/20 Potential to Achieve Goals: Good Progress towards PT goals: Progressing toward goals    Frequency    Min 3X/week      PT Plan Current plan remains appropriate    Co-evaluation              AM-PAC PT "6 Clicks" Mobility    Outcome Measure  Help needed turning from your back to your side while in a flat bed without using bedrails?: None Help needed moving from lying on your back to sitting on the side of a flat bed without using bedrails?: None Help needed moving to and from a bed to a chair (including a wheelchair)?: None Help needed standing up from a chair using your arms (e.g., wheelchair or bedside chair)?: None Help needed to walk in hospital room?: A Little Help needed climbing 3-5 steps with a railing? : A Little 6 Click Score: 22    End of Session Equipment Utilized During Treatment: Oxygen Activity Tolerance: Patient tolerated treatment well Patient left: in bed;with call bell/phone within reach Nurse Communication: Other (comment);Mobility status PT Visit Diagnosis: Other abnormalities of gait and mobility (R26.89)     Time: 4503-8882 PT Time Calculation (min) (ACUTE ONLY): 17 min  Charges:  $Gait Training: 8-22 mins                     Lyanne Co, DPT Acute Rehabilitation Services [phone]   [place] 12/27/2019, 2:11 PM

## 2019-12-28 LAB — SJOGRENS SYNDROME-A EXTRACTABLE NUCLEAR ANTIBODY: SSA (Ro) (ENA) Antibody, IgG: <0.2 AI (ref 0.0–0.9)

## 2019-12-28 LAB — ANTI-JO 1 ANTIBODY, IGG: Anti JO-1: <0.2 AI (ref 0.0–0.9)

## 2019-12-28 LAB — ANA: Anti Nuclear Antibody (ANA): NEGATIVE

## 2019-12-28 LAB — MPO/PR-3 (ANCA) ANTIBODIES
ANCA Proteinase 3: <3.5 U/mL (ref 0.0–3.5)
Myeloperoxidase Abs: <9 U/mL (ref 0.0–9.0)

## 2019-12-28 LAB — ANTI-SCLERODERMA ANTIBODY: Scleroderma (Scl-70) (ENA) Antibody, IgG: <0.2 AI (ref 0.0–0.9)

## 2019-12-28 LAB — RHEUMATOID FACTOR: Rheumatoid fact SerPl-aCnc: <10 IU/mL (ref 0.0–13.9)

## 2019-12-28 MED ORDER — ALBUTEROL SULFATE HFA 108 (90 BASE) MCG/ACT IN AERS: 2.0000 | INHALATION_SPRAY | Freq: Four times a day (QID) | RESPIRATORY_TRACT | 0 refills | Status: DC | PRN

## 2019-12-28 NOTE — Discharge Summary (Addendum)
PATIENT DETAILS Name: [name] Age: 79 y.o. Sex: female Date of Birth: [birth year] MRN: [id number]. Admitting Physician: Jonetta Osgood, MD MPN:[id number], Dalbert Batman, MD  Admit Date: 12/25/2019 Discharge date: 12/28/2019  Recommendations for Outpatient Follow-up:  1. Follow up with PCP in 1-2 weeks 2. Please obtain CMP/CBC in one week 3. Autoimmune serologies pending at the time of discharge-please follow 4. Pulmonology plans to do further work-up including PFTs in the outpatient setting-please ensure patient follows up with pulmonology. 59. Being discharged on home O2. 6. Has a lung nodule seen on CT chest-we will require appropriate follow-up at the discretion of pulmonology and primary care practitioner.  Admitted From:  Home  Disposition: Home with home health services   Home Health: Yes  Equipment/Devices: oxygen 2L  Discharge Condition: Stable  CODE STATUS: FULL CODE  Diet recommendation:  Diet Order            Diet - low sodium heart healthy           Diet Heart Room service appropriate? Yes; Fluid consistency: Thin  Diet effective now                  Brief Narrative: Patient is a 79 y.o. female with history of subclavian artery stenosis, HTN, hypothyroidism-presented with 5-week history of worsening exertional dyspnea.  Found to be hypoxic with O2 sats in the low 80s on room air-requiring around 4 L of oxygen-subsequently admitted to the hospitalist service for further evaluation and treatment.  COVID-19 vaccination status: Unvaccinated (offered vaccination while in the hospital-she refused-wants to talk to her primary care practitioner)  Significant events: 9/14>> presenting with exertional dyspnea-hypoxic-requiring O2 supplementation.  Admit to [facility].  Significant studies: 9/14>> CT chest: Spiculated 7 mm right upper lobe nodule, 5 mm pulmonary nodule-suggestion of several adjacent satellite micronodules.  Right upper lobe infiltrate with  numerous mediastinal/hilar lymphadenopathy. 9/15>> echo: EF 60-65%, PA pressure 60.5 mmHg 9/16>> lower extremity Doppler: Negative for DVT 9/16>> VQ scan: Low probability   Antimicrobial therapy: Doxycycline: 9/15>> 9/17  Microbiology data: 9/14>>SARS Coronavirus 2: Negative  Procedures : None  Consults: PCCM  Brief Hospital Course: Acute hypoxic respiratory failure:  Thought to be due to newly diagnosed pulmonary hypertension-qualifies for home O2.  VQ scan negative for pulmonary embolism.  Autoimmune work-up pending.  Discussed with pulmonology MD ([name]) this morning-further work-up including PFTs etc. can be pursued in the outpatient setting.  Recommends patient go home on as needed rescue inhaler-[name] will arrange for pulmonary follow-up.  Will be discharged on home O2.   PNA: Infiltrate seen on imaging studies-but really does not have a lot of symptoms of pneumonia.  Seen by PCCM and started on a course of doxycycline/steroids.  Suspect does not require any further therapy on discharge (note-patient is not on chronic steroids in the outpatient setting per medication review with her daughter).  Lung nodules: Seen incidentally on CT chest-stable for outpatient follow-up by PCP and pulmonology.  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, followed by stenting of right carotid artery in 2013: Followed by vascular surgery-remains on antiplatelet agents   Discharge Diagnoses:  Active Problems:   Essential hypertension   Hyperlipidemia   Hypothyroidism   Acute on chronic respiratory failure with hypoxia (HCC)   Acute respiratory failure with hypoxemia (HCC)   SOB (shortness of breath)   Discharge Instructions:  Activity:  As tolerated with Full fall precautions use walker/cane &  assistance as needed  Discharge Instructions    Call MD for:  difficulty breathing, headache or visual disturbances   Complete by: As directed    Call MD for:  extreme fatigue   Complete by: As directed    Diet - low sodium heart healthy   Complete by: As directed    Discharge instructions   Complete by: As directed    Follow with Primary MD  [name], MD in 1-2 weeks  Use home O2 24/7.  Pulmonologist office (lung doctors office) we will give you a call for a follow-up appointment- further work-up of pulmonary hypertension will be done in the outpatient setting.  Blood work for autoimmune diseases is currently pending at the time of discharge-please ask your primary care practitioner to follow-up on these results or have your pulmonologist follow-up on these results at the time of follow-up appointment.  You have some lung nodules that were seen on your CT scan of the chest-these are incidental findings-but in some cases-these can turn cancerous.  Please ask your primary care practitioner or your primary lung doctor to repeat CT scan in the next few months. It is highly recommended that you get the vaccine against COVID-19 virus-please talk to your primary care doctor as soon as possible regarding this.  Please get a complete blood count and chemistry panel checked by your Primary MD at your next visit, and again as instructed by your Primary MD.  Get Medicines reviewed and adjusted: Please take all your medications with you for your next visit with your Primary MD  Laboratory/radiological data: Please request your Primary MD to go over all hospital tests and procedure/radiological results at the follow up, please ask your Primary MD to get all Hospital records sent to his/her office.  In some cases, they will be blood work, cultures and biopsy results pending at the time of your discharge. Please request that your primary care M.D. follows up on these results.  Also Note  the following: If you experience worsening of your admission symptoms, develop shortness of breath, life threatening emergency, suicidal or homicidal thoughts you must seek medical attention immediately by calling 911 or calling your MD immediately  if symptoms less severe.  You must read complete instructions/literature along with all the possible adverse reactions/side effects for all the Medicines you take and that have been prescribed to you. Take any new Medicines after you have completely understood and accpet all the possible adverse reactions/side effects.   Do not drive when taking Pain medications or sleeping medications (Benzodaizepines)  Do not take more than prescribed Pain, Sleep and Anxiety Medications. It is not advisable to combine anxiety,sleep and pain medications without talking with your primary care practitioner  Special Instructions: If you have smoked or chewed Tobacco  in the last 2 yrs please stop smoking, stop any regular Alcohol  and or any Recreational drug use.  Wear Seat belts while driving.  Please note: You were cared for by a hospitalist during your hospital stay. Once you are discharged, your primary care physician will handle any further medical issues. Please note that NO REFILLS for any discharge medications will be authorized once you are discharged, as it is imperative that you return to your primary care physician (or establish a relationship with a primary care physician if you do not have one) for your post hospital discharge needs so that they can reassess your need for  medications and monitor your lab values.   Increase activity slowly   Complete by: As directed      Allergies as of 12/28/2019      Reactions   Meclizine Nausea And Vomiting      Medication List    STOP taking these medications   methylPREDNISolone 4 MG tablet Commonly known as: Medrol     TAKE these medications   acetaminophen 325 MG tablet Commonly known as: TYLENOL Take  650 mg every 6 (six) hours as needed by mouth for mild pain or headache.   albuterol 108 (90 Base) MCG/ACT inhaler Commonly known as: VENTOLIN HFA Inhale 2 puffs into the lungs every 6 (six) hours as needed for wheezing or shortness of breath.   Alive [employer] Tabs Take 1 tablet by mouth daily.   amLODipine 5 MG tablet Commonly known as: NORVASC TAKE 1 TABLET BY MOUTH EVERY DAY   aspirin 81 MG chewable tablet Chew 81 mg by mouth daily.   calcium-vitamin D 500-200 MG-UNIT tablet Commonly known as: OSCAL WITH D Take 1 tablet by mouth daily with breakfast.   cetirizine 10 MG tablet Commonly known as: ZYRTEC Take 10 mg daily as needed by mouth for allergies.   clopidogrel 75 MG tablet Commonly known as: PLAVIX TAKE 1 TABLET BY MOUTH EVERY DAY   [employer] Stool Softener 100 MG capsule Generic drug: docusate sodium TAKE 1 CAPSULE (100 MG TOTAL) BY MOUTH DAILY AS NEEDED FOR MILD CONSTIPATION. What changed: See the new instructions.   diazepam 2 MG tablet Commonly known as: VALIUM Take 0.5 tablets (1 mg total) by mouth every 12 (twelve) hours as needed (vertigo).   diclofenac Sodium 1 % Gel Commonly known as: Voltaren Apply 2 g topically 4 (four) times daily.   fluticasone 50 MCG/ACT nasal spray Commonly known as: FLONASE PLACE 1 SPRAY INTO BOTH NOSTRILS DAILY AS NEEDED FOR ALLERGIES OR RHINITIS (FOR SINUS CONGESTION).   gabapentin 100 MG capsule Commonly known as: NEURONTIN TAKE 1 CAPSULE (100 MG TOTAL) BY MOUTH AT BEDTIME.   latanoprost 0.005 % ophthalmic solution Commonly known as: XALATAN Place 1 drop at bedtime into both eyes.   levothyroxine 50 MCG tablet Commonly known as: SYNTHROID TAKE 1 TABLET BY MOUTH EVERY DAY What changed: when to take this   lisinopril 20 MG tablet Commonly known as: ZESTRIL TAKE 1 TABLET BY MOUTH EVERY DAY   metoprolol succinate 25 MG 24 hr tablet Commonly known as: TOPROL-XL TAKE 1 TABLET BY MOUTH EVERY DAY   PROBIOTIC PO Take  1 capsule by mouth daily.   simvastatin 20 MG tablet Commonly known as: ZOCOR TAKE 1 TABLET BY MOUTH EVERYDAY AT BEDTIME What changed: See the new instructions.   Systane Ultra 0.4-0.3 % Soln Generic drug: Polyethyl Glycol-Propyl Glycol Place 1 drop into both eyes as directed.            Durable Medical Equipment  (From admission, onward)         Start     Ordered   12/27/19 1341  For home use only DME oxygen  Once       Question Answer Comment  Length of Need 6 Months   Mode or (Route) Nasal cannula   Liters per Minute 3   Frequency Continuous (stationary and portable oxygen unit needed)   Oxygen conserving device Yes   Oxygen delivery system Gas      12/27/19 1340          Follow-up Information  [name], MD. Schedule an appointment as soon as possible for a visit in 1 week(s).   Specialty: Internal Medicine Contact information: [place] [id number] [phone]        Laurin Coder, MD Follow up.   Specialty: Pulmonary Disease Why: office will call you with a follow up appointment-if you do not hear from them in the next few days-please call and get yourself a follow up appointment. Please let them know that you were seen by the lung doctor while your were in the hospital. Contact information: [address] [place] [address] [phone]              Allergies  Allergen Reactions  . Meclizine Nausea And Vomiting   Other Procedures/Studies: DG Chest 2 View  Result Date: 12/25/2019 CLINICAL DATA:  Shortness of breath with exertion for approximately 6-7 months EXAM: CHEST - 2 VIEW COMPARISON:  Radiograph 12/04/2019 FINDINGS: There is chronic hyperinflation. Some coarsened interstitial changes are present in both lungs which are increased from comparison radiography particularly towards the lung bases. Some mild bronchitic changes are noted as well. Small nodular, likely calcific radiodensity is seen  along the right paratracheal stripe, likely present on comparison. Vascular stent noted in the suspected location of the brachiocephalic vein. The aorta is calcified. The remaining cardiomediastinal contours are unremarkable. No acute osseous or soft tissue abnormality. Degenerative changes are present in the imaged spine and shoulders. Features of bilateral calcific tendinosis of the shoulders. IMPRESSION: 1. Diffusely coarsened interstitial changes throughout the lungs may reflect some developing interstitial lung disease or atypical infection. 2. Chronic hyperinflation. 3. Likely small pleural plaque or calcified granuloma in the right upper lobe. 4.  Aortic Atherosclerosis (ICD10-I70.0). Electronically Signed   By: Lovena Le M.D.   On: 12/25/2019 16:05   CT CHEST W CONTRAST  Result Date: 12/25/2019 CLINICAL DATA:  Respiratory failure, hypertension, GERD, carotid artery occlusion, carotid subclavian anastomosis, carotid stent insertion EXAM: CT CHEST WITH CONTRAST TECHNIQUE: Multidetector CT imaging of the chest was performed during intravenous contrast administration. CONTRAST:  57m OMNIPAQUE IOHEXOL 300 MG/ML  SOLN COMPARISON:  Chest x-ray 12/25/2019 FINDINGS: Cardiovascular: Normal heart size. No significant pericardial effusion. The thoracic aorta is normal in caliber. At least moderate calcified and noncalcified atherosclerotic plaque of the thoracic aorta. Right common carotid origin stent placement noted with patency evaluation limited due to streak artifact originating from adjacent venous structures containing intravenous contrast. At least mild to moderate four-vessel coronary artery calcifications. The main pulmonary artery is borderline enlarged measuring up to 3 cm. No central or segmental pulmonary embolus. Mediastinum/Nodes: Multiple enlarged mediastinal and bilateral hilar lymph nodes with as an example a 1.1 cm right paratracheal (3:47), 1.3 cm left paratracheal (3:53), 1 cm subcarinal  (3:67), 1 cm right hilar (3:72), 1 cm left hilar, 3:77) Lymph nodes. Prominent paraesophageal and retrocrural lymph nodes open (3:58, 131). No enlarged axillary lymph nodes. Thyroid gland, trachea, and esophagus demonstrate no significant findings. Lungs/Pleura: Spiculated 7 mm right upper lobe pulmonary nodule (5:39). 5 mm pulmonary nodule posteriorly (5:39). Suggestion of several adjacent satellite micronodules within the peripheral right upper lobe open (5:30-46). Pulmonary micronodule within the left upper lobe that is ground-glass in density (5:47). Scattered centrilobular micronodules within the left upper lobe. Diffuse mild interlobular septal wall thickening. Mild diffuse bronchial wall thickening. No pleural effusion or pneumothorax. Upper Abdomen: Fluid-fluid level within the gastric lumen likely related to ingested material. Interval decrease in size of  a now punctate calcification within the common bile duct open (6:40). Otherwise no acute abnormality Musculoskeletal: No chest wall abnormality No suspicious lytic or blastic osseous lesions. No acute displaced fracture. Multilevel degenerative changes of the spine. IMPRESSION: 1. Right upper lobe atypical pneumonia with likely reactive mediastinal and hilar lymphadenopathy. Recommend repeat CT in 6-8 weeks status post treatment for infection to exclude an underlying malignancy. 2. A 4 mm and spiculated 7 mm right upper lobe nodule. Non-contrast chest CT at 3-6 months is recommended. If the nodules are stable at time of repeat CT, then future CT at 18-24 months (from today's scan) is considered optional for low-risk patients, but is recommended for high-risk patients. This recommendation follows the consensus statement: Guidelines for Management of Incidental Pulmonary Nodules Detected on CT Images: From the Fleischner Society 2017; Radiology 2017; 284:228-243. 3.  Aortic Atherosclerosis (ICD10-I70.0). Electronically Signed   By: Iven Finn M.D.   On:  12/25/2019 23:48   NM Pulmonary Perfusion  Result Date: 12/27/2019 CLINICAL DATA:  Respiratory failure EXAM: NUCLEAR MEDICINE PERFUSION LUNG SCAN TECHNIQUE: Perfusion images were obtained in multiple projections after intravenous injection of radiopharmaceutical. Ventilation scans intentionally deferred if perfusion scan and chest x-ray adequate for interpretation during COVID 19 epidemic. RADIOPHARMACEUTICALS:  4.2 mCi Tc-67mMAA IV COMPARISON:  Chest radiograph 12/27/2019, CT 12/25/2019 FINDINGS: There are small subsegmental perfusion defects in the upper lobes bilaterally, on the left this may be matched to an area of opacity seen on the chest radiograph. No mismatched segmental perfusion defects are present to suggest the presence of pulmonary artery emboli on this perfusion only exam when interpreted in combination with the same day chest radiograph and recent chest CT. IMPRESSION: The presence of two small subsegmental perfusion defects in the upper lobes, including the likely matched defect on the left, fail to meet the diagnostic criteria for a pulmonary embolism in this exam. Per modified PIOPED 2 criteria study should be interpreted as a pulmonary embolism absent (normal or very low probability). Reference: Sensitivity and specificity of perfusion scintigraphy combined with chest radiography for acute pulmonary embolism in PIOPED II. (2008) Journal of nuclear medicine : official publication, Society of Nuclear Medicine. 49 (11): 1741-8. Electronically Signed   By: PLovena LeM.D.   On: 12/27/2019 19:06   DG CHEST PORT 1 VIEW  Result Date: 12/27/2019 CLINICAL DATA:  Shortness of breath EXAM: PORTABLE CHEST 1 VIEW COMPARISON:  Chest CT and chest radiograph December 25, 2019 FINDINGS: There is new subtle ill-defined opacity in the left upper lobe toward the apex. There is interstitial thickening in the bases. Lungs elsewhere are grossly clear. The nodular opacity seen on recent chest CT are not  appreciated by radiography. The heart size and pulmonary vascularity are normal. No adenopathy. There is aortic atherosclerosis. There is a right subclavian stent. No bone lesions. IMPRESSION: Subtle ill-defined opacity left upper lobe toward the apex which may represent developing pneumonia. Suspect a degree of underlying chronic bronchitis. No appreciable adenopathy. Heart size normal. Aortic Atherosclerosis (ICD10-I70.0). Electronically Signed   By: WLowella GripIII M.D.   On: 12/27/2019 14:29   ECHOCARDIOGRAM COMPLETE  Result Date: 12/26/2019    ECHOCARDIOGRAM REPORT   Patient Name:   Holly CALLINSDate of Exam: 12/26/2019 Medical Rec #:  [id number]      Height:       62.0 in Accession #:    [id number]     Weight:       133.0 lb Date of  Birth:  10-Mar-1941       BSA:          1.607 m Patient Age:    58 years        BP:           148/60 mmHg Patient Gender: F               HR:           75 bpm. Exam Location:  Inpatient Procedure: 2D Echo, Cardiac Doppler and Color Doppler Indications:    R06.02 SOB  History:        Patient has prior history of Echocardiogram examinations. Risk                 Factors:Hypertension, Former Smoker and Dyslipidemia.  Sonographer:    Bernadene Person RDCS Referring Phys: [place]  1. Left ventricular ejection fraction, by estimation, is 60 to 65%. The left ventricle has normal function. Left ventricular endocardial border not optimally defined to evaluate regional wall motion. Left ventricular diastolic parameters are indeterminate.  2. Right ventricular systolic function is normal. The right ventricular size is normal. Moderately increased right ventricular wall thickness. There is moderately elevated pulmonary artery systolic pressure. The estimated right ventricular systolic pressure is 83.4 mmHg.  3. The mitral valve is normal in structure. Trivial mitral valve regurgitation. No evidence of mitral stenosis.  4. The aortic valve is tricuspid.  Aortic valve regurgitation is not visualized. No aortic stenosis is present. FINDINGS  Left Ventricle: Left ventricular ejection fraction, by estimation, is 60 to 65%. The left ventricle has normal function. Left ventricular endocardial border not optimally defined to evaluate regional wall motion. The left ventricular internal cavity size was normal in size. There is no left ventricular hypertrophy. Left ventricular diastolic parameters are indeterminate. Right Ventricle: The right ventricular size is normal. Moderately increased right ventricular wall thickness. Right ventricular systolic function is normal. There is moderately elevated pulmonary artery systolic pressure. The tricuspid regurgitant velocity is 3.79 m/s, and with an assumed right atrial pressure of 3 mmHg, the estimated right ventricular systolic pressure is 19.6 mmHg. Left Atrium: Left atrial size was normal in size. Right Atrium: Right atrial size was normal in size. Pericardium: There is no evidence of pericardial effusion. Mitral Valve: The mitral valve is normal in structure. Trivial mitral valve regurgitation. No evidence of mitral valve stenosis. Tricuspid Valve: The tricuspid valve is normal in structure. Tricuspid valve regurgitation is mild. Aortic Valve: The aortic valve is tricuspid. Aortic valve regurgitation is not visualized. No aortic stenosis is present. Pulmonic Valve: The pulmonic valve was grossly normal. Pulmonic valve regurgitation is mild. No evidence of pulmonic stenosis. Aorta: The aortic root and ascending aorta are structurally normal, with no evidence of dilitation. IAS/Shunts: The atrial septum is grossly normal.  LEFT VENTRICLE PLAX 2D LVIDd:         3.90 cm  Diastology LVIDs:         2.70 cm  LV e' medial:    6.85 cm/s LV PW:         0.80 cm  LV E/e' medial:  10.1 LV IVS:        0.90 cm  LV e' lateral:   9.57 cm/s LVOT diam:     2.00 cm  LV E/e' lateral: 7.3 LV SV:         68 LV SV Index:   42 LVOT Area:     3.14 cm   RIGHT  VENTRICLE RV S prime:     9.68 cm/s TAPSE (M-mode): 1.6 cm LEFT ATRIUM             Index       RIGHT ATRIUM           Index LA diam:        2.60 cm 1.62 cm/m  RA Area:     13.10 cm LA Vol (A2C):   26.7 ml 16.61 ml/m RA Volume:   28.20 ml  17.54 ml/m LA Vol (A4C):   25.5 ml 15.86 ml/m LA Biplane Vol: 25.8 ml 16.05 ml/m  AORTIC VALVE LVOT Vmax:   87.50 cm/s LVOT Vmean:  65.000 cm/s LVOT VTI:    0.217 m  AORTA Ao Root diam: 3.30 cm Ao Asc diam:  3.40 cm MITRAL VALVE               TRICUSPID VALVE MV Area (PHT): 2.91 cm    TR Peak grad:   57.5 mmHg MV Decel Time: 261 msec    TR Vmax:        379.00 cm/s MV E velocity: 69.50 cm/s MV A velocity: 85.90 cm/s  SHUNTS MV E/A ratio:  0.81        Systemic VTI:  0.22 m                            Systemic Diam: 2.00 cm Mertie Moores MD Electronically signed by Mertie Moores MD Signature Date/Time: 12/26/2019/2:08:04 PM    Final    VAS [place] LOWER EXTREMITY VENOUS (DVT)  Result Date: 12/27/2019  Lower Venous DVT Study Indications: Swelling.  Risk Factors: Pulmonary hypertension. Performing Technologist: Sharion Dove RVS  Examination Guidelines: A complete evaluation includes B-mode imaging, spectral Doppler, color Doppler, and power Doppler as needed of all accessible portions of each vessel. Bilateral testing is considered an integral part of a complete examination. Limited examinations for reoccurring indications may be performed as noted. The reflux portion of the exam is performed with the patient in reverse Trendelenburg.  +---------+---------------+---------+-----------+----------+--------------+ RIGHT    CompressibilityPhasicitySpontaneityPropertiesThrombus Aging +---------+---------------+---------+-----------+----------+--------------+ CFV      Full           Yes      Yes                                 +---------+---------------+---------+-----------+----------+--------------+ SFJ      Full                                                         +---------+---------------+---------+-----------+----------+--------------+ FV Prox  Full                                                        +---------+---------------+---------+-----------+----------+--------------+ FV Mid   Full                                                        +---------+---------------+---------+-----------+----------+--------------+  FV DistalFull                                                        +---------+---------------+---------+-----------+----------+--------------+ PFV      Full                                                        +---------+---------------+---------+-----------+----------+--------------+ POP      Full           Yes      Yes                                 +---------+---------------+---------+-----------+----------+--------------+ PTV      Full                                                        +---------+---------------+---------+-----------+----------+--------------+ PERO     Full                                                        +---------+---------------+---------+-----------+----------+--------------+   +---------+---------------+---------+-----------+----------+--------------+ LEFT     CompressibilityPhasicitySpontaneityPropertiesThrombus Aging +---------+---------------+---------+-----------+----------+--------------+ CFV      Full           Yes      Yes                                 +---------+---------------+---------+-----------+----------+--------------+ SFJ      Full                                                        +---------+---------------+---------+-----------+----------+--------------+ FV Prox  Full                                                        +---------+---------------+---------+-----------+----------+--------------+ FV Mid   Full                                                         +---------+---------------+---------+-----------+----------+--------------+ FV DistalFull                                                        +---------+---------------+---------+-----------+----------+--------------+  PFV      Full                                                        +---------+---------------+---------+-----------+----------+--------------+ POP      Full           Yes      Yes                                 +---------+---------------+---------+-----------+----------+--------------+ PTV      Full                                                        +---------+---------------+---------+-----------+----------+--------------+ PERO     Full                                                        +---------+---------------+---------+-----------+----------+--------------+     Summary: BILATERAL: - No evidence of deep vein thrombosis seen in the lower extremities, bilaterally. -   *See table(s) above for measurements and observations. Electronically signed by Monica Martinez MD on 12/27/2019 at 4:13:25 PM.    Final      TODAY-DAY OF DISCHARGE:  Subjective:   Pincus Badder today has no headache,no chest abdominal pain,no new weakness tingling or numbness, feels much better wants to go home today.   Objective:   Blood pressure (!) 113/57, pulse 72, temperature 97.9 F (36.6 C), temperature source Oral, resp. rate 18, height _0  (1.6 m), weight 54 kg, SpO2 96 %.  Intake/Output Summary (Last 24 hours) at 12/28/2019 0957 Last data filed at 12/28/2019 0801 Gross per 24 hour  Intake 790 ml  Output 950 ml  Net -160 ml   Filed Weights   12/26/19 2200 12/27/19 0649 12/28/19 0608  Weight: 53.9 kg 53.4 kg 54 kg    Exam: Awake Alert, Oriented *3, No new F.N deficits, Normal affect Forrest.AT,PERRAL Supple Neck,No JVD, No cervical lymphadenopathy appriciated.  Symmetrical Chest wall movement, Good air movement bilaterally, CTAB RRR,No Gallops,Rubs or  new Murmurs, No Parasternal Heave +ve B.Sounds, Abd Soft, Non tender, No organomegaly appriciated, No rebound -guarding or rigidity. No Cyanosis, Clubbing or edema, No new Rash or bruise   PERTINENT RADIOLOGIC STUDIES: NM Pulmonary Perfusion  Result Date: 12/27/2019 CLINICAL DATA:  Respiratory failure EXAM: NUCLEAR MEDICINE PERFUSION LUNG SCAN TECHNIQUE: Perfusion images were obtained in multiple projections after intravenous injection of radiopharmaceutical. Ventilation scans intentionally deferred if perfusion scan and chest x-ray adequate for interpretation during COVID 19 epidemic. RADIOPHARMACEUTICALS:  4.2 mCi Tc-75mMAA IV COMPARISON:  Chest radiograph 12/27/2019, CT 12/25/2019 FINDINGS: There are small subsegmental perfusion defects in the upper lobes bilaterally, on the left this may be matched to an area of opacity seen on the chest radiograph. No mismatched segmental perfusion defects are present to suggest the presence of pulmonary artery emboli on this perfusion only exam when interpreted in combination with the same day chest radiograph and recent  chest CT. IMPRESSION: The presence of two small subsegmental perfusion defects in the upper lobes, including the likely matched defect on the left, fail to meet the diagnostic criteria for a pulmonary embolism in this exam. Per modified PIOPED 2 criteria study should be interpreted as a pulmonary embolism absent (normal or very low probability). Reference: Sensitivity and specificity of perfusion scintigraphy combined with chest radiography for acute pulmonary embolism in PIOPED II. (2008) Journal of nuclear medicine : official publication, Society of Nuclear Medicine. 49 (11): 1741-8. Electronically Signed   By: Lovena Le M.D.   On: 12/27/2019 19:06   DG CHEST PORT 1 VIEW  Result Date: 12/27/2019 CLINICAL DATA:  Shortness of breath EXAM: PORTABLE CHEST 1 VIEW COMPARISON:  Chest CT and chest radiograph December 25, 2019 FINDINGS: There is new  subtle ill-defined opacity in the left upper lobe toward the apex. There is interstitial thickening in the bases. Lungs elsewhere are grossly clear. The nodular opacity seen on recent chest CT are not appreciated by radiography. The heart size and pulmonary vascularity are normal. No adenopathy. There is aortic atherosclerosis. There is a right subclavian stent. No bone lesions. IMPRESSION: Subtle ill-defined opacity left upper lobe toward the apex which may represent developing pneumonia. Suspect a degree of underlying chronic bronchitis. No appreciable adenopathy. Heart size normal. Aortic Atherosclerosis (ICD10-I70.0). Electronically Signed   By: Lowella Grip III M.D.   On: 12/27/2019 14:29   ECHOCARDIOGRAM COMPLETE  Result Date: 12/26/2019    ECHOCARDIOGRAM REPORT   Patient Name:   [name] Date of Exam: 12/26/2019 Medical Rec #:  [id number]       Height:       62.0 in Accession #:    [id number]      Weight:       133.0 lb Date of Birth:  Apr 06, 1941       BSA:          1.607 m Patient Age:    62 years        BP:           148/60 mmHg Patient Gender: F               HR:           75 bpm. Exam Location:  Inpatient Procedure: 2D Echo, Cardiac Doppler and Color Doppler Indications:    R06.02 SOB  History:        Patient has prior history of Echocardiogram examinations. Risk                 Factors:Hypertension, Former Smoker and Dyslipidemia.  Sonographer:    Bernadene Person RDCS Referring Phys: [place]  1. Left ventricular ejection fraction, by estimation, is 60 to 65%. The left ventricle has normal function. Left ventricular endocardial border not optimally defined to evaluate regional wall motion. Left ventricular diastolic parameters are indeterminate.  2. Right ventricular systolic function is normal. The right ventricular size is normal. Moderately increased right ventricular wall thickness. There is moderately elevated pulmonary artery systolic pressure. The estimated  right ventricular systolic pressure is 75.1 mmHg.  3. The mitral valve is normal in structure. Trivial mitral valve regurgitation. No evidence of mitral stenosis.  4. The aortic valve is tricuspid. Aortic valve regurgitation is not visualized. No aortic stenosis is present. FINDINGS  Left Ventricle: Left ventricular ejection fraction, by estimation, is 60 to 65%. The left ventricle has normal function. Left ventricular endocardial border not optimally defined to evaluate  regional wall motion. The left ventricular internal cavity size was normal in size. There is no left ventricular hypertrophy. Left ventricular diastolic parameters are indeterminate. Right Ventricle: The right ventricular size is normal. Moderately increased right ventricular wall thickness. Right ventricular systolic function is normal. There is moderately elevated pulmonary artery systolic pressure. The tricuspid regurgitant velocity is 3.79 m/s, and with an assumed right atrial pressure of 3 mmHg, the estimated right ventricular systolic pressure is 08.6 mmHg. Left Atrium: Left atrial size was normal in size. Right Atrium: Right atrial size was normal in size. Pericardium: There is no evidence of pericardial effusion. Mitral Valve: The mitral valve is normal in structure. Trivial mitral valve regurgitation. No evidence of mitral valve stenosis. Tricuspid Valve: The tricuspid valve is normal in structure. Tricuspid valve regurgitation is mild. Aortic Valve: The aortic valve is tricuspid. Aortic valve regurgitation is not visualized. No aortic stenosis is present. Pulmonic Valve: The pulmonic valve was grossly normal. Pulmonic valve regurgitation is mild. No evidence of pulmonic stenosis. Aorta: The aortic root and ascending aorta are structurally normal, with no evidence of dilitation. IAS/Shunts: The atrial septum is grossly normal.  LEFT VENTRICLE PLAX 2D LVIDd:         3.90 cm  Diastology LVIDs:         2.70 cm  LV e' medial:    6.85 cm/s LV  PW:         0.80 cm  LV E/e' medial:  10.1 LV IVS:        0.90 cm  LV e' lateral:   9.57 cm/s LVOT diam:     2.00 cm  LV E/e' lateral: 7.3 LV SV:         68 LV SV Index:   42 LVOT Area:     3.14 cm  RIGHT VENTRICLE RV S prime:     9.68 cm/s TAPSE (M-mode): 1.6 cm LEFT ATRIUM             Index       RIGHT ATRIUM           Index LA diam:        2.60 cm 1.62 cm/m  RA Area:     13.10 cm LA Vol (A2C):   26.7 ml 16.61 ml/m RA Volume:   28.20 ml  17.54 ml/m LA Vol (A4C):   25.5 ml 15.86 ml/m LA Biplane Vol: 25.8 ml 16.05 ml/m  AORTIC VALVE LVOT Vmax:   87.50 cm/s LVOT Vmean:  65.000 cm/s LVOT VTI:    0.217 m  AORTA Ao Root diam: 3.30 cm Ao Asc diam:  3.40 cm MITRAL VALVE               TRICUSPID VALVE MV Area (PHT): 2.91 cm    TR Peak grad:   57.5 mmHg MV Decel Time: 261 msec    TR Vmax:        379.00 cm/s MV E velocity: 69.50 cm/s MV A velocity: 85.90 cm/s  SHUNTS MV E/A ratio:  0.81        Systemic VTI:  0.22 m                            Systemic Diam: 2.00 cm Mertie Moores MD Electronically signed by Mertie Moores MD Signature Date/Time: 12/26/2019/2:08:04 PM    Final    VAS [place] LOWER EXTREMITY VENOUS (DVT)  Result Date: 12/27/2019  Lower Venous DVT Study Indications: Swelling.  Risk Factors: Pulmonary hypertension. Performing Technologist: Sharion Dove RVS  Examination Guidelines: A complete evaluation includes B-mode imaging, spectral Doppler, color Doppler, and power Doppler as needed of all accessible portions of each vessel. Bilateral testing is considered an integral part of a complete examination. Limited examinations for reoccurring indications may be performed as noted. The reflux portion of the exam is performed with the patient in reverse Trendelenburg.  +---------+---------------+---------+-----------+----------+--------------+ RIGHT    CompressibilityPhasicitySpontaneityPropertiesThrombus Aging +---------+---------------+---------+-----------+----------+--------------+ CFV      Full            Yes      Yes                                 +---------+---------------+---------+-----------+----------+--------------+ SFJ      Full                                                        +---------+---------------+---------+-----------+----------+--------------+ FV Prox  Full                                                        +---------+---------------+---------+-----------+----------+--------------+ FV Mid   Full                                                        +---------+---------------+---------+-----------+----------+--------------+ FV DistalFull                                                        +---------+---------------+---------+-----------+----------+--------------+ PFV      Full                                                        +---------+---------------+---------+-----------+----------+--------------+ POP      Full           Yes      Yes                                 +---------+---------------+---------+-----------+----------+--------------+ PTV      Full                                                        +---------+---------------+---------+-----------+----------+--------------+ PERO     Full                                                        +---------+---------------+---------+-----------+----------+--------------+   +---------+---------------+---------+-----------+----------+--------------+  LEFT     CompressibilityPhasicitySpontaneityPropertiesThrombus Aging +---------+---------------+---------+-----------+----------+--------------+ CFV      Full           Yes      Yes                                 +---------+---------------+---------+-----------+----------+--------------+ SFJ      Full                                                        +---------+---------------+---------+-----------+----------+--------------+ FV Prox  Full                                                         +---------+---------------+---------+-----------+----------+--------------+ FV Mid   Full                                                        +---------+---------------+---------+-----------+----------+--------------+ FV DistalFull                                                        +---------+---------------+---------+-----------+----------+--------------+ PFV      Full                                                        +---------+---------------+---------+-----------+----------+--------------+ POP      Full           Yes      Yes                                 +---------+---------------+---------+-----------+----------+--------------+ PTV      Full                                                        +---------+---------------+---------+-----------+----------+--------------+ PERO     Full                                                        +---------+---------------+---------+-----------+----------+--------------+     Summary: BILATERAL: - No evidence of deep vein thrombosis seen in the lower extremities, bilaterally. -   *See table(s) above for measurements and observations. Electronically signed by Monica Martinez MD on 12/27/2019 at 4:13:25 PM.    Final  PERTINENT LAB RESULTS: CBC: Recent Labs    12/25/19 1517 12/25/19 1517 12/25/19 2218 12/27/19 0831  WBC 7.7  --   --  8.5  HGB 12.4   < > 13.6 14.1  HCT 40.0   < > 40.0 45.4  PLT 215  --   --  225   < > = values in this interval not displayed.   CMET CMP     Component Value Date/Time   NA 139 12/27/2019 0831   NA 144 09/11/2019 0853   K 4.8 12/27/2019 0831   CL 110 12/27/2019 0831   CO2 18 (L) 12/27/2019 0831   GLUCOSE 124 (H) 12/27/2019 0831   BUN 14 12/27/2019 0831   BUN 9 09/11/2019 0853   CREATININE 0.86 12/27/2019 0831   CALCIUM 9.9 12/27/2019 0831   CALCIUM 7.3 (L) 04/18/2008 1710   PROT 7.8 09/11/2019 0853   ALBUMIN 4.5 09/11/2019 0853   AST 20  09/11/2019 0853   ALT 10 09/11/2019 0853   ALKPHOS 89 09/11/2019 0853   BILITOT 0.4 09/11/2019 0853   GFRNONAA >60 12/27/2019 0831   GFRAA >60 12/27/2019 0831    GFR Estimated Creatinine Clearance: 42.4 mL/min (by C-G formula based on SCr of 0.86 mg/dL). No results for input(s): LIPASE, AMYLASE in the last 72 hours. Recent Labs    12/26/19 0709  CKTOTAL 101   Invalid input(s): POCBNP Recent Labs    12/26/19 0709  DDIMER 0.83*   Recent Labs    12/26/19 0709  HGBA1C 5.7*   No results for input(s): CHOL, HDL, LDLCALC, TRIG, CHOLHDL, LDLDIRECT in the last 72 hours. Recent Labs    12/26/19 0709  TSH 4.257   No results for input(s): VITAMINB12, FOLATE, FERRITIN, TIBC, IRON, RETICCTPCT in the last 72 hours. Coags: Recent Labs    12/25/19 1517  INR 1.1   Microbiology: Recent Results (from the past 240 hour(s))  SARS Coronavirus 2 by RT PCR (hospital order, performed in [facility] hospital lab) Nasopharyngeal Nasopharyngeal Swab     Status: None   Collection Time: 12/25/19  6:52 PM   Specimen: Nasopharyngeal Swab  Result Value Ref Range Status   SARS Coronavirus 2 NEGATIVE NEGATIVE Final    Comment: (NOTE) SARS-CoV-2 target nucleic acids are NOT DETECTED.  The SARS-CoV-2 RNA is generally detectable in upper and lower respiratory specimens during the acute phase of infection. The lowest concentration of SARS-CoV-2 viral copies this assay can detect is 250 copies / mL. A negative result does not preclude SARS-CoV-2 infection and should not be used as the sole basis for treatment or other patient management decisions.  A negative result may occur with improper specimen collection / handling, submission of specimen other than nasopharyngeal swab, presence of viral mutation(s) within the areas targeted by this assay, and inadequate number of viral copies (<250 copies / mL). A negative result must be combined with clinical observations, patient history, and  epidemiological information.  Fact Sheet for Patients:   StrictlyIdeas.no  Fact Sheet for Healthcare Providers: BankingDealers.co.za  This test is not yet approved or  cleared by the [place] FDA and has been authorized for detection and/or diagnosis of SARS-CoV-2 by FDA under an Emergency Use Authorization (EUA).  This EUA will remain in effect (meaning this test can be used) for the duration of the COVID-19 declaration under Section 564(b)(1) of the Act, 21 U.S.C. section 360bbb-3(b)(1), unless the authorization is terminated or revoked sooner.  Performed at [facility], [place] [address],  [place], [place] [address]     FURTHER DISCHARGE INSTRUCTIONS:  Get Medicines reviewed and adjusted: Please take all your medications with you for your next visit with your Primary MD  Laboratory/radiological data: Please request your Primary MD to go over all hospital tests and procedure/radiological results at the follow up, please ask your Primary MD to get all Hospital records sent to his/her office.  In some cases, they will be blood work, cultures and biopsy results pending at the time of your discharge. Please request that your primary care M.D. goes through all the records of your hospital data and follows up on these results.  Also Note the following: If you experience worsening of your admission symptoms, develop shortness of breath, life threatening emergency, suicidal or homicidal thoughts you must seek medical attention immediately by calling 911 or calling your MD immediately  if symptoms less severe.  You must read complete instructions/literature along with all the possible adverse reactions/side effects for all the Medicines you take and that have been prescribed to you. Take any new Medicines after you have completely understood and accpet all the possible adverse reactions/side effects.   Do not drive when taking Pain  medications or sleeping medications (Benzodaizepines)  Do not take more than prescribed Pain, Sleep and Anxiety Medications. It is not advisable to combine anxiety,sleep and pain medications without talking with your primary care practitioner  Special Instructions: If you have smoked or chewed Tobacco  in the last 2 yrs please stop smoking, stop any regular Alcohol  and or any Recreational drug use.  Wear Seat belts while driving.  Please note: You were cared for by a hospitalist during your hospital stay. Once you are discharged, your primary care physician will handle any further medical issues. Please note that NO REFILLS for any discharge medications will be authorized once you are discharged, as it is imperative that you return to your primary care physician (or establish a relationship with a primary care physician if you do not have one) for your post hospital discharge needs so that they can reassess your need for medications and monitor your lab values.  Total Time spent coordinating discharge including counseling, education and face to face time equals 35 minutes.  SignedOren Binet 12/28/2019 9:57 AM

## 2019-12-28 NOTE — Progress Notes (Signed)
Discharge packet given. Pt's belongings with pt.   Oxygen delivered to pt's room.  Pt was transported in wheelchair to main entrance by nurse tech, with belongings.

## 2019-12-28 NOTE — Consult Note (Signed)
   [facility] CM Inpatient Consult   12/28/2019  [name] Aug 11, 1940 [id number]   [facility] [ACO] Patient: Medicare NextGen   Patient screened for hospitalization and declining home health follow up with new oxygen. Reviewed to check for potential [facility] service needs.  Review of patient's medical record reveals patient transitioned home. Primary Care Provider is Karle Plumber, MD at [facility] this provider is listed to provide the transition of care [TOC] for post hospital follow up.  Called patient who had already transitioned home, HIPAA verified.  Explained [facility] Care Management services for post hospital follow up.  Patient states, "I'm home and feeling just fine.  We are here just resting and feeling good to be home." She denies any nursing phone calls for follow up. She did states she "tied to call the doctor's office before I came to the hospital but they told me just to go on to the hospital."  Explained that she has access to a 24 hour nurse line and will have information sent to her home address and she agreed.  Plan: Have brochure and 24 hour nurse adise line information mailed to her home.  Please place a [facility] Care Management consult as appropriate and for questions contact:   Natividad Brood, RN BSN [facility] Liaison  [phone] business mobile phone Toll free office [phone]  Fax number: [phone] [email or web address]

## 2019-12-29 LAB — CYCLIC CITRUL PEPTIDE ANTIBODY, IGG/IGA: CCP Antibodies IgG/IgA: 9 units (ref 0–19)

## 2019-12-29 LAB — MITOCHONDRIAL ANTIBODIES: Mitochondrial M2 Ab, IgG: <20 Units (ref 0.0–20.0)

## 2019-12-30 LAB — ALDOLASE: Aldolase: 6 U/L (ref 3.3–10.3)

## 2019-12-31 ENCOUNTER — Telehealth: Payer: Self-pay

## 2019-12-31 LAB — ANCA TITERS
Atypical P-ANCA titer: <1:20 {titer}
C-ANCA: <1:20 {titer}
P-ANCA: <1:20 {titer}

## 2019-12-31 NOTE — Telephone Encounter (Signed)
Transition Care Management Follow-up Telephone Call  Date of discharge and from where: 12/28/2019, [facility]  How have you been since you were released from the hospital? She said she is feeling good, doing much better  Any questions or concerns?  none at this time  Items Reviewed:  Did the pt receive and understand the discharge instructions provided?  she said she has the instructions and did not have any questions at this time.  Instructed her to have her daughters call the clinic if they have any questions about medications or instructions.   Medications obtained and verified?  she said she has all of her medications and did not have any questions about her med regime.  She said she manages her own medications.   Any new allergies since your discharge?  none reported   Do you have support at home?  she said she lives alone but her daughters check on her regularly.  Has home O2 that she is using @ 2L continuously.  Had refused home health services   Functional Questionnaire: (I = Independent and D = Dependent) ADLs:independent, she said her daughters provide any needed assistance. She does not use an assistive device with ambulation  Follow up appointments reviewed:    PCP Hospital f/u appt confirmed? Dr Wynetta Emery 01/15/2020.   [facility] f/u appt confirmed?Needs appointment with pulmonary  Are transportation arrangements needed? no, her daughters drive her to appointments    If their condition worsens, is the pt aware to call PCP or go to the Emergency Dept.?  yes  Was the patient provided with contact information for the PCP's office or ED? provided her with the phone number for [facility]  Was to pt encouraged to call back with questions or concerns?  yes

## 2020-01-15 ENCOUNTER — Other Ambulatory Visit: Payer: Self-pay

## 2020-01-15 ENCOUNTER — Encounter: Payer: Self-pay | Admitting: Internal Medicine

## 2020-01-15 ENCOUNTER — Ambulatory Visit: Payer: Medicare Other | Attending: Internal Medicine | Admitting: Internal Medicine

## 2020-01-15 VITALS — BP 107/59 | HR 76 | Resp 16 | Wt 123.2 lb

## 2020-01-15 DIAGNOSIS — R918 Other nonspecific abnormal finding of lung field: Secondary | ICD-10-CM | POA: Diagnosis not present

## 2020-01-15 DIAGNOSIS — Z79899 Other long term (current) drug therapy: Secondary | ICD-10-CM | POA: Insufficient documentation

## 2020-01-15 DIAGNOSIS — K5900 Constipation, unspecified: Secondary | ICD-10-CM | POA: Diagnosis not present

## 2020-01-15 DIAGNOSIS — I739 Peripheral vascular disease, unspecified: Secondary | ICD-10-CM | POA: Diagnosis not present

## 2020-01-15 DIAGNOSIS — Z95820 Peripheral vascular angioplasty status with implants and grafts: Secondary | ICD-10-CM | POA: Insufficient documentation

## 2020-01-15 DIAGNOSIS — R300 Dysuria: Secondary | ICD-10-CM

## 2020-01-15 DIAGNOSIS — Z87891 Personal history of nicotine dependence: Secondary | ICD-10-CM | POA: Insufficient documentation

## 2020-01-15 DIAGNOSIS — R0981 Nasal congestion: Secondary | ICD-10-CM | POA: Diagnosis not present

## 2020-01-15 DIAGNOSIS — N3946 Mixed incontinence: Secondary | ICD-10-CM | POA: Diagnosis not present

## 2020-01-15 DIAGNOSIS — I272 Pulmonary hypertension, unspecified: Secondary | ICD-10-CM | POA: Insufficient documentation

## 2020-01-15 DIAGNOSIS — E039 Hypothyroidism, unspecified: Secondary | ICD-10-CM | POA: Insufficient documentation

## 2020-01-15 DIAGNOSIS — I6521 Occlusion and stenosis of right carotid artery: Secondary | ICD-10-CM | POA: Insufficient documentation

## 2020-01-15 DIAGNOSIS — R42 Dizziness and giddiness: Secondary | ICD-10-CM | POA: Diagnosis not present

## 2020-01-15 DIAGNOSIS — Z9981 Dependence on supplemental oxygen: Secondary | ICD-10-CM | POA: Insufficient documentation

## 2020-01-15 DIAGNOSIS — I10 Essential (primary) hypertension: Secondary | ICD-10-CM | POA: Diagnosis not present

## 2020-01-15 DIAGNOSIS — Z09 Encounter for follow-up examination after completed treatment for conditions other than malignant neoplasm: Secondary | ICD-10-CM

## 2020-01-15 DIAGNOSIS — J9611 Chronic respiratory failure with hypoxia: Secondary | ICD-10-CM | POA: Diagnosis not present

## 2020-01-15 DIAGNOSIS — Z7982 Long term (current) use of aspirin: Secondary | ICD-10-CM | POA: Insufficient documentation

## 2020-01-15 DIAGNOSIS — Z7902 Long term (current) use of antithrombotics/antiplatelets: Secondary | ICD-10-CM | POA: Insufficient documentation

## 2020-01-15 DIAGNOSIS — Z791 Long term (current) use of non-steroidal anti-inflammatories (NSAID): Secondary | ICD-10-CM | POA: Insufficient documentation

## 2020-01-15 DIAGNOSIS — E785 Hyperlipidemia, unspecified: Secondary | ICD-10-CM | POA: Insufficient documentation

## 2020-01-15 DIAGNOSIS — Z79890 Hormone replacement therapy (postmenopausal): Secondary | ICD-10-CM | POA: Insufficient documentation

## 2020-01-15 MED ORDER — FLUTICASONE PROPIONATE 50 MCG/ACT NA SUSP: 1.0000 | Freq: Every day | NASAL | 3 refills | Status: AC | PRN

## 2020-01-15 MED ORDER — DIAZEPAM 2 MG PO TABS: 1.0000 mg | ORAL_TABLET | Freq: Two times a day (BID) | ORAL | 1 refills | Status: DC | PRN

## 2020-01-15 MED ORDER — DOCUSATE SODIUM 100 MG PO CAPS: ORAL_CAPSULE | ORAL | 2 refills | Status: DC

## 2020-01-15 NOTE — Progress Notes (Signed)
Patient ID: [name], female    DOB: 04-Mar-1941  MRN: [id number]  CC: Hospitalization Follow-up   Subjective: [name] is a 79 y.o. female who presents for hosp f/u.  Her daughter is with her. 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   Patient hospitalized 9/14-17/2021 with tautness of breath and acute toxic respiratory failure.  Patient found to have pulmonary hypertension.  Treated with a course of antibiotics and steroids for findings on chest x-ray/CT. -Chest x-ray revealed ill-defined opacity in the left upper lobe towards the apex which may represent developing pneumonia.  Incidental finding of aortic arthrosclerosis.   -CT of the chest revealed right upper lobe atypical pneumonia with likely reactive mediastinal and hilar lymphadenopathy.  Repeat CT in 6 to 8 weeks recommended.  Incidental finding of 2 nodules in the right upper lobe the largest of which is 7 mm.  Follow-up CT recommended. -: EF 60 to 65%, PA pressure of 60 mmHg Doppler lower extremity negative for DVT.  VQ scan low probability. -COVID-19 test negative. -Autoimmune and rheumatologic serologies negative.  Sedimentation rate mildly elevated at 46. Patient discharged on home O2 2 L continuous.  Also discharged with albuterol inhaler which she has not been using.  Today:  Since being home patient has continued to use the O2 2 L continuous.  Daughter checks pulse ox twice a day.  Pulse ox in the mornings usually between 93 to 94% with the highest being 95.  In the evenings pulse ox between 92 to 93%.  She would like to get a small portable oxygen device because the larger one she is unable to carry by herself in public.  She has lost about 10 pounds at the time of her hospitalization.  She  has gained some of the weight back but not yet at her previous baseline.  She reports that her appetite is good.  -Patient denies any cough or fever.  She endorses shortness of breath with minimal activities.  She has albuterol inhaler but has not been using it.  She quit smoking over 50 years ago.  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 -She denies loud snoring and her daughter endorses this.  She denies increased daytime sleepiness.  She has not had any sick contacts. She is declining COVID vaccine and flu vaccine. She is supposed to follow-up with pulmonary but no appointment as yet.  However I now see an appointment in the system for October 26.  Complains of leakage of urine with coughing sneezing or laughing.  When she gets the urge to urinate sometimes she is unable to hold it until she gets to the restroom.  She reports some mild dysuria.  Requesting refill on diazepam, Flonase Patient Active Problem List   Diagnosis Date Noted  . SOB (shortness of breath) 12/26/2019  . Acute on chronic respiratory failure with hypoxia ([place]) 12/25/2019  . Acute respiratory failure with hypoxemia ([place]) 12/25/2019  . Essential hypertension 06/06/2017  . Hyperlipidemia 06/06/2017  . Leg cramps 06/06/2017  . Vertigo 06/06/2017  . Macular degeneration 06/06/2017  . Hypothyroidism 06/06/2017  . Carotid stenosis 01/22/2014  . Aftercare following surgery of the circulatory system, [place] 01/02/2013  . Occlusion and stenosis  of carotid artery without mention of cerebral infarction 04/27/2011     Current Outpatient Medications on File Prior to Visit  Medication Sig Dispense Refill  . acetaminophen (TYLENOL) 325 MG tablet Take 650 mg every 6 (six) hours as needed by mouth for mild pain or headache.     . albuterol (VENTOLIN HFA) 108 (90 Base) MCG/ACT inhaler Inhale 2 puffs into the lungs every 6  (six) hours as needed for wheezing or shortness of breath. 8 g 0  . amLODipine (NORVASC) 5 MG tablet TAKE 1 TABLET BY MOUTH EVERY DAY (Patient taking differently: Take 5 mg by mouth daily. ) 90 tablet 1  . aspirin 81 MG chewable tablet Chew 81 mg by mouth daily.    . calcium-vitamin D (OSCAL WITH D) 500-200 MG-UNIT per tablet Take 1 tablet by mouth daily with breakfast.    . cetirizine (ZYRTEC) 10 MG tablet Take 10 mg daily as needed by mouth for allergies.     Marland Kitchen clopidogrel (PLAVIX) 75 MG tablet TAKE 1 TABLET BY MOUTH EVERY DAY (Patient taking differently: Take 75 mg by mouth daily. ) 90 tablet 1  . [employer] STOOL SOFTENER 100 MG capsule TAKE 1 CAPSULE (100 MG TOTAL) BY MOUTH DAILY AS NEEDED FOR MILD CONSTIPATION. (Patient taking differently: Take 100 mg by mouth daily as needed for mild constipation. ) 30 capsule 2  . diazepam (VALIUM) 2 MG tablet Take 0.5 tablets (1 mg total) by mouth every 12 (twelve) hours as needed (vertigo). 15 tablet 1  . diclofenac Sodium (VOLTAREN) 1 % GEL Apply 2 g topically 4 (four) times daily. 100 g 0  . fluticasone (FLONASE) 50 MCG/ACT nasal spray PLACE 1 SPRAY INTO BOTH NOSTRILS DAILY AS NEEDED FOR ALLERGIES OR RHINITIS (FOR SINUS CONGESTION). 48 mL 0  . gabapentin (NEURONTIN) 100 MG capsule TAKE 1 CAPSULE (100 MG TOTAL) BY MOUTH AT BEDTIME. 90 capsule 1  . latanoprost (XALATAN) 0.005 % ophthalmic solution Place 1 drop at bedtime into both eyes.    Marland Kitchen levothyroxine (SYNTHROID) 50 MCG tablet TAKE 1 TABLET BY MOUTH EVERY DAY (Patient taking differently: Take 50 mcg by mouth daily before breakfast. ) 90 tablet 1  . lisinopril (ZESTRIL) 20 MG tablet TAKE 1 TABLET BY MOUTH EVERY DAY (Patient taking differently: Take 20 mg by mouth daily. ) 90 tablet 0  . metoprolol succinate (TOPROL-XL) 25 MG 24 hr tablet TAKE 1 TABLET BY MOUTH EVERY DAY (Patient taking differently: Take 25 mg by mouth daily. ) 30 tablet 1  . Multiple Vitamins-Minerals (ALIVE WOMENS ENERGY) TABS Take 1 tablet by  mouth daily.    . Probiotic Product (PROBIOTIC PO) Take 1 capsule by mouth daily.    . simvastatin (ZOCOR) 20 MG tablet TAKE 1 TABLET BY MOUTH EVERYDAY AT BEDTIME (Patient taking differently: Take 20 mg by mouth at bedtime. ) 90 tablet 2  . SYSTANE ULTRA 0.4-0.3 % SOLN Place 1 drop into both eyes as directed.     No current facility-administered medications on file prior to visit.    Allergies  Allergen Reactions  . Meclizine Nausea And Vomiting    Social History   Socioeconomic History  . Marital status: Single    Spouse name: Not on file  . Number of children: Not on file  . Years of education: Not on file  . Highest education level: Not on file  Occupational History  . Not on file  Tobacco Use  . Smoking status: Former Smoker    Packs/day: 0.30  Years: 5.00    Pack years: 1.50    Types: Cigarettes    Quit date: 04/12/1980    Years since quitting: 39.7  . Smokeless tobacco: Never Used  Vaping Use  . Vaping Use: Never used  Substance and Sexual Activity  . Alcohol use: No  . Drug use: No  . Sexual activity: Not Currently  Other Topics Concern  . Not on file  Social History Narrative  . Not on file   Social Determinants of Health   Financial Resource Strain:   . Difficulty of Paying Living Expenses: Not on file  Food Insecurity:   . Worried About Charity fundraiser in the Last Year: Not on file  . Ran Out of Food in the Last Year: Not on file  Transportation Needs:   . Lack of Transportation (Medical): Not on file  . Lack of Transportation (Non-Medical): Not on file  Physical Activity:   . Days of Exercise per Week: Not on file  . Minutes of Exercise per Session: Not on file  Stress:   . Feeling of Stress : Not on file  Social Connections:   . Frequency of Communication with Friends and Family: Not on file  . Frequency of Social Gatherings with Friends and Family: Not on file  . Attends Religious Services: Not on file  . Active Member of Clubs or  Organizations: Not on file  . Attends Archivist Meetings: Not on file  . Marital Status: Not on file  Intimate Partner Violence:   . Fear of Current or Ex-Partner: Not on file  . Emotionally Abused: Not on file  . Physically Abused: Not on file  . Sexually Abused: Not on file    Family History  Problem Relation Age of Onset  . Heart failure Mother   . Cancer Father   . Breast cancer Neg Hx     Past Surgical History:  Procedure Laterality Date  . ABDOMINAL HYSTERECTOMY    . AORTIC ARCH ANGIOGRAPHY N/A 03/01/2017   Procedure: AORTIC ARCH ANGIOGRAPHY;  Surgeon: Serafina [name], MD;  Location: [facility];  Service: Cardiovascular;  Laterality: N/A;  . ARCH AORTOGRAM N/A 05/04/2011   Procedure: ARCH AORTOGRAM;  Surgeon: Serafina [name], MD;  Location: [facility] CATH LAB;  Service: Cardiovascular;  Laterality: N/A;  . ARCH AORTOGRAM N/A 07/04/2012   Procedure: ARCH AORTOGRAM;  Surgeon: Serafina [name], MD;  Location: [facility] CATH LAB;  Service: Cardiovascular;  Laterality: N/A;  . CAROTID ANGIOGRAM  July 04, 2012  . CAROTID ANGIOGRAM N/A 05/04/2011   Procedure: CAROTID ANGIOGRAM;  Surgeon: Serafina [name], MD;  Location: [facility] CATH LAB;  Service: Cardiovascular;  Laterality: N/A;  . CAROTID ANGIOGRAM N/A 07/04/2012   Procedure: CAROTID ANGIOGRAM;  Surgeon: Serafina [name], MD;  Location[facility] C[facility] LAB;  Service: Cardiovascular;  Laterality: N/A;  . CAROTID STENT INSERTION Right 05/11/2011   Procedure: CAROTID STENT INSERTION;  Surgeon: Serafina [name], MD;  Location: [facility] CATH LAB;  Service: Cardiovascular;  Laterality: Right;  . carotid subclavian anastomosis  05/11/11   Right subclav. artery transected 12/17/09  . CATARACT EXTRACTION     bilateral  . EYE SURGERY      ROS: Review of Systems Negative except as stated above  PHYSICAL EXAM: BP (!) 107/59   Pulse 76   Resp 16   Wt 123 lb 3.2 oz (55.9 kg)   SpO2 90%   BMI 21.82 kg/m   Wt Readings from Last 3  Encounters:  01/15/20  123 lb 3.2 oz (55.9 kg)  12/28/19 119 lb 1.6 oz (54 kg)  05/03/19 133 lb (60.3 kg)  Pulse ox on 2 L O2 resting 86 to 93%. Pulse ox resting without O2 is 74%. Pulse ox on 3 L of O2 is 94% at rest.  Physical Exam  General appearance -pleasant elderly female in no acute distress.  She is able to talk in complete sentences. Mental status - normal mood, behavior, speech, dress, motor activity, and thought processes Mouth - mucous membranes moist, pharynx normal without lesions Neck - supple, no significant adenopathy Chest -breath sounds markedly decreased bilaterally.  No wheezes or crackles heard Heart - normal rate, regular rhythm, normal S1, S2, no murmurs, rubs, clicks or gallops Extremities -no lower extremity edema. CMP Latest Ref Rng & Units 12/27/2019 12/25/2019 12/25/2019  Glucose 70 - 99 mg/dL 124(H) - 105(H)  BUN 8 - 23 mg/dL 14 - 14  Creatinine 0.44 - 1.00 mg/dL 0.86 - 0.91  Sodium 135 - 145 mmol/L 139 144 139  Potassium 3.5 - 5.1 mmol/L 4.8 3.6 3.7  Chloride 98 - 111 mmol/L 110 - 110  CO2 22 - 32 mmol/L 18(L) - 19(L)  Calcium 8.9 - 10.3 mg/dL 9.9 - 9.2  Total Protein 6.0 - 8.5 g/dL - - -  Total Bilirubin 0.0 - 1.2 mg/dL - - -  Alkaline Phos 48 - 121 IU/L - - -  AST 0 - 40 IU/L - - -  ALT 0 - 32 IU/L - - -   Lipid Panel     Component Value Date/Time   CHOL 145 09/11/2019 0853   TRIG 125 09/11/2019 0853   HDL 49 09/11/2019 0853   CHOLHDL 3.0 09/11/2019 0853   CHOLHDL 3.4 04/17/2008 2240   VLDL 11 04/17/2008 2240   LDLCALC 74 09/11/2019 0853    CBC    Component Value Date/Time   WBC 8.5 12/27/2019 0831   RBC 4.79 12/27/2019 0831   HGB 14.1 12/27/2019 0831   HGB 12.5 09/11/2019 0853   HCT 45.4 12/27/2019 0831   HCT 40.0 09/11/2019 0853   PLT 225 12/27/2019 0831   PLT 247 09/11/2019 0853   MCV 94.8 12/27/2019 0831   MCV 95 09/11/2019 0853   MCH 29.4 12/27/2019 0831   MCHC 31.1 12/27/2019 0831   RDW 15.8 (H) 12/27/2019 0831   RDW 14.1 09/11/2019 0853    LYMPHSABS 4.2 (H) 12/03/2009 1123   MONOABS 0.4 12/03/2009 1123   EOSABS 0.1 12/03/2009 1123   BASOSABS 0.1 12/03/2009 1123    ASSESSMENT AND PLAN: 1. Hospital discharge follow-up 2. Pulmonary hypertension ([place]) -Patient with significant pulmonary hypertension with likely cause at this time due to underlying lung pathology with hypoxia.  VQ scan did not suggest CTEPH.  Serology studies not suggestive of underlying connective tissue disease like crest or rheumatoid arthritis.  BNP was only mildly elevated at 119, less suggestive of left heart failure. Patient to keep follow-up appointment with pulmonary.  May also refer her to cardiology.  3. Hypoxemic respiratory failure, chronic ([place]) See #2 above. I recommend increasing the oxygen to 3 L continuous. I will submit a prescription to her medical supplier for small portable oxygen. Advised to use the albuterol inhaler prior to any ambulatory activity Will need repeat CAT scan of the chest 6 weeks from the last study.  I will order that. Keep appointment with pulmonary.  4. Lung nodule, multiple 5. Lung infiltrate Follow-up with pulmonary.  6. Mixed  stress and urge incontinence We will prescribe depends underwear.  7. Essential hypertension At goal. - CBC - Comprehensive metabolic panel  8. Dysuria - Urine Culture - Urinalysis    Patient was given the opportunity to ask questions.  Patient verbalized understanding of the plan and was able to repeat key elements of the plan.   Orders Placed This Encounter  Procedures  . Urine Culture  . CBC  . Comprehensive metabolic panel  . Urinalysis     Requested Prescriptions    No prescriptions requested or ordered in this encounter    No follow-ups on file.  Karle Plumber, MD, FACP

## 2020-01-16 ENCOUNTER — Telehealth: Payer: Self-pay

## 2020-01-16 LAB — COMPREHENSIVE METABOLIC PANEL
ALT: 21 IU/L (ref 0–32)
AST: 27 IU/L (ref 0–40)
Albumin/Globulin Ratio: 1.2 (ref 1.2–2.2)
Albumin: 4.5 g/dL (ref 3.6–4.6)
Alkaline Phosphatase: 93 IU/L (ref 44–121)
BUN/Creatinine Ratio: 13 (ref 12–28)
BUN: 10 mg/dL (ref 8–27)
Bilirubin Total: 0.5 mg/dL (ref 0.0–1.2)
CO2: 20 mmol/L (ref 20–29)
Calcium: 9.6 mg/dL (ref 8.7–10.3)
Chloride: 108 mmol/L — ABNORMAL HIGH (ref 96–106)
Creatinine, Ser: 0.75 mg/dL (ref 0.57–1.00)
GFR calc Af Amer: 86 mL/min/{1.73_m2} (ref >59)
GFR calc non Af Amer: 75 mL/min/{1.73_m2} (ref >59)
Globulin, Total: 3.7 g/dL (ref 1.5–4.5)
Glucose: 93 mg/dL (ref 65–99)
Potassium: 4.1 mmol/L (ref 3.5–5.2)
Sodium: 142 mmol/L (ref 134–144)
Total Protein: 8.2 g/dL (ref 6.0–8.5)

## 2020-01-16 LAB — URINALYSIS
Bilirubin, UA: NEGATIVE
Glucose, UA: NEGATIVE
Ketones, UA: NEGATIVE
Leukocytes,UA: NEGATIVE
Nitrite, UA: NEGATIVE
Protein,UA: NEGATIVE
RBC, UA: NEGATIVE
Specific Gravity, UA: 1.01 (ref 1.005–1.030)
Urobilinogen, Ur: 0.2 mg/dL (ref 0.2–1.0)
pH, UA: 5.5 (ref 5.0–7.5)

## 2020-01-16 LAB — CBC
Hematocrit: 41.9 % (ref 34.0–46.6)
Hemoglobin: 13.3 g/dL (ref 11.1–15.9)
MCH: 29.9 pg (ref 26.6–33.0)
MCHC: 31.7 g/dL (ref 31.5–35.7)
MCV: 94 fL (ref 79–97)
Platelets: 224 10*3/uL (ref 150–450)
RBC: 4.45 x10E6/uL (ref 3.77–5.28)
RDW: 14.5 % (ref 11.7–15.4)
WBC: 8.9 10*3/uL (ref 3.4–10.8)

## 2020-01-16 NOTE — Telephone Encounter (Signed)
Contacted pt to go over lab results pt is aware and doesn't have any questions or concerns 

## 2020-01-16 NOTE — Telephone Encounter (Signed)
Contacted pt/daughter to go over appointment information for CT no answer had to leave a detailed vm    CT is scheduled for February 01, 2020 at 3pm at [facility]. Pt will need to arrive at 245pm. Pt will need to have liquids only 4 hours prior.

## 2020-01-16 NOTE — Progress Notes (Signed)
Urine test is normal.  Blood cell counts including white blood cells, red blood cell and platelet counts are normal.  Kidney and liver function tests are normal.

## 2020-01-17 LAB — URINE CULTURE

## 2020-01-23 ENCOUNTER — Telehealth: Payer: Self-pay | Admitting: Internal Medicine

## 2020-01-23 NOTE — Telephone Encounter (Signed)
Patient checking on the status of portable oxygen machine orders please send to [facility].   Patient states PCP advised her she would send orders over but company denies receiving.   Patient states she would like this request expedited, stating if her lights go out she would be unable to utilize her machine.  patient would like a follow up call when completed [phone]

## 2020-01-23 NOTE — Telephone Encounter (Signed)
Opal Sidles do you have any updated information regarding pt o2 tank

## 2020-01-24 NOTE — Telephone Encounter (Signed)
Pt's daughter called back requesting to speak with nurse, has questions. Please advise   Best contact: [phone]

## 2020-01-25 ENCOUNTER — Inpatient Hospital Stay: Payer: Medicare Other | Admitting: Internal Medicine

## 2020-01-25 NOTE — Telephone Encounter (Signed)
Spoke w/ pt's daughter Caprice Red and answered her questions re: O2 concentrator through [facility], advised daughter that script was faxed to [facility] this morning.

## 2020-01-30 ENCOUNTER — Other Ambulatory Visit: Payer: Self-pay | Admitting: Internal Medicine

## 2020-01-30 DIAGNOSIS — E039 Hypothyroidism, unspecified: Secondary | ICD-10-CM

## 2020-01-30 NOTE — Telephone Encounter (Signed)
Requested Prescriptions  Pending Prescriptions Disp Refills  . levothyroxine (SYNTHROID) 50 MCG tablet [Pharmacy Med Name: LEVOTHYROXINE 50 MCG TABLET] 90 tablet 1    Sig: TAKE 1 TABLET BY MOUTH EVERY DAY     Endocrinology:  Hypothyroid Agents Failed - 01/30/2020 12:07 AM      Failed - TSH needs to be rechecked within 3 months after an abnormal result. Refill until TSH is due.      Passed - TSH in normal range and within 360 days    TSH  Date Value Ref Range Status  12/26/2019 4.257 0.350 - 4.500 uIU/mL Final    Comment:    Performed by a 3rd Generation assay with a functional sensitivity of <=0.01 uIU/mL. Performed at [facility], [place] [address] [place], [place] [address]   06/18/2019 3.640 0.450 - 4.500 uIU/mL Final         Passed - Valid encounter within last 12 months    Recent Outpatient Visits          2 weeks ago Hospital discharge follow-up   [place], Deborah B, MD   4 months ago Essential hypertension   [place], MD   9 months ago Hypothyroidism, unspecified type   [place], MD   11 months ago Stenosis of carotid artery, unspecified laterality   [place], Dionne Bucy, [place]   1 year ago Essential hypertension   [place] [name], MD

## 2020-02-01 ENCOUNTER — Ambulatory Visit (HOSPITAL_COMMUNITY): Payer: Medicare Other | Attending: Internal Medicine

## 2020-02-05 ENCOUNTER — Encounter: Payer: Self-pay | Admitting: Adult Health

## 2020-02-05 ENCOUNTER — Other Ambulatory Visit: Payer: Self-pay

## 2020-02-05 ENCOUNTER — Ambulatory Visit (INDEPENDENT_AMBULATORY_CARE_PROVIDER_SITE_OTHER): Payer: Medicare Other | Admitting: Adult Health

## 2020-02-05 VITALS — BP 116/50 | HR 89 | Temp 97.2°F | Ht 62.0 in | Wt 123.6 lb

## 2020-02-05 DIAGNOSIS — R918 Other nonspecific abnormal finding of lung field: Secondary | ICD-10-CM | POA: Diagnosis not present

## 2020-02-05 DIAGNOSIS — J9611 Chronic respiratory failure with hypoxia: Secondary | ICD-10-CM | POA: Diagnosis not present

## 2020-02-05 DIAGNOSIS — I272 Pulmonary hypertension, unspecified: Secondary | ICD-10-CM | POA: Diagnosis not present

## 2020-02-05 DIAGNOSIS — I6529 Occlusion and stenosis of unspecified carotid artery: Secondary | ICD-10-CM

## 2020-02-05 DIAGNOSIS — I270 Primary pulmonary hypertension: Secondary | ICD-10-CM | POA: Diagnosis not present

## 2020-02-05 DIAGNOSIS — R0602 Shortness of breath: Secondary | ICD-10-CM | POA: Diagnosis not present

## 2020-02-05 NOTE — Progress Notes (Signed)
_0  ID: [name], female    DOB: 01/12/1941, 79 y.o.   MRN: [id number]  Chief Complaint  Patient presents with  . Hospitalization Follow-up    Referring provider: [name], MD  HPI: 79 year old former smoker seen for pulmonary consult during hospitalization for acute respiratory distress dyspnea and lung nodules.  TEST/EVENTS :  CT chest December 25, 2019 spiculated 7 mm right upper lobe nodule, 5 mm pulmonary nodule posteriorly, micronodules along the right upper lobe.  Left upper lobe groundglass density  VQ scan December 27, 2019 low probability for PE  2D echo December 26, 2019 EF 4166%, RV systolic function normal.  RV size normal.  Moderately elevated pulmonary artery systolic pressure 60 mmHg.  Venous Doppler negative for DVT  02/05/2020 Follow up : PNA , O2 RF, pulmonary hypertension Patient presents for a post hospital follow-up.  Patient was recently seen in the hospital for pulmonary consult for acute respiratory distress, shortness of breath hypoxia and CT chest with scattered lung nodules.  She was treated for suspected pneumonia with antibiotics and steroids.  Started on oxygen at discharge at 3 L.  Patient CT chest showed scattered nodules in the right upper lobe and left upper lobe.  Patient is a former smoker. Patient says she is retired.  She lives at home and is active.  She noticed around June of this year that she started having some worsening shortness of breath with activities.  Around August symptoms progressively worsened.  She has no significant cough or leg swelling.  No orthopnea. 2D echo during hospitalization showed pulmonary hypertension.. She does have some restless sleep and daytime sleepiness.  BMI is 22.  Patient is a former smoker. Autoimmune testing was negative except for sed rate was elevated at 46  weight is down approximately 10 pounds over the last year  Allergies  Allergen Reactions  . Meclizine Nausea And Vomiting      There is no immunization history on file for this patient.  Past Medical History:  Diagnosis Date  . Allergy   . Arthritis   . Carotid artery occlusion   . GERD (gastroesophageal reflux disease)   . Hypertension   . Joint pain   . Thyroid disease     Tobacco History: Social History   Tobacco Use  Smoking Status Former Smoker  . Packs/day: 0.30  . Years: 5.00  . Pack years: 1.50  . Types: Cigarettes  . Quit date: 04/12/1980  . Years since quitting: 39.8  Smokeless Tobacco Never Used   Counseling given: Not Answered   Outpatient Medications Prior to Visit  Medication Sig Dispense Refill  . acetaminophen (TYLENOL) 325 MG tablet Take 650 mg every 6 (six) hours as needed by mouth for mild pain or headache.     . albuterol (VENTOLIN HFA) 108 (90 Base) MCG/ACT inhaler Inhale 2 puffs into the lungs every 6 (six) hours as needed for wheezing or shortness of breath. 8 g 0  . amLODipine (NORVASC) 5 MG tablet TAKE 1 TABLET BY MOUTH EVERY DAY (Patient taking differently: Take 5 mg by mouth daily. ) 90 tablet 1  . aspirin 81 MG chewable tablet Chew 81 mg by mouth daily.    . calcium-vitamin D (OSCAL WITH D) 500-200 MG-UNIT per tablet Take 1 tablet by mouth daily with breakfast.    . cetirizine (ZYRTEC) 10 MG tablet Take 10 mg daily as needed by mouth for allergies.     Marland Kitchen clopidogrel (PLAVIX) 75 MG tablet TAKE 1  TABLET BY MOUTH EVERY DAY (Patient taking differently: Take 75 mg by mouth daily. ) 90 tablet 1  . diazepam (VALIUM) 2 MG tablet Take 0.5 tablets (1 mg total) by mouth every 12 (twelve) hours as needed (vertigo). 15 tablet 1  . diclofenac Sodium (VOLTAREN) 1 % GEL Apply 2 g topically 4 (four) times daily. 100 g 0  . docusate sodium ([employer] STOOL SOFTENER) 100 MG capsule TAKE 1 CAPSULE (100 MG TOTAL) BY MOUTH DAILY AS NEEDED FOR MILD CONSTIPATION. 30 capsule 2  . fluticasone (FLONASE) 50 MCG/ACT nasal spray Place 1 spray into both nostrils daily as needed for allergies or rhinitis  (for sinus congestion). 48 mL 3  . gabapentin (NEURONTIN) 100 MG capsule TAKE 1 CAPSULE (100 MG TOTAL) BY MOUTH AT BEDTIME. 90 capsule 1  . latanoprost (XALATAN) 0.005 % ophthalmic solution Place 1 drop at bedtime into both eyes.    Marland Kitchen levothyroxine (SYNTHROID) 50 MCG tablet TAKE 1 TABLET BY MOUTH EVERY DAY 90 tablet 3  . lisinopril (ZESTRIL) 20 MG tablet TAKE 1 TABLET BY MOUTH EVERY DAY (Patient taking differently: Take 20 mg by mouth daily. ) 90 tablet 0  . metoprolol succinate (TOPROL-XL) 25 MG 24 hr tablet TAKE 1 TABLET BY MOUTH EVERY DAY (Patient taking differently: Take 25 mg by mouth daily. ) 30 tablet 1  . Multiple Vitamins-Minerals (ALIVE WOMENS ENERGY) TABS Take 1 tablet by mouth daily.    . Probiotic Product (PROBIOTIC PO) Take 1 capsule by mouth daily.    . simvastatin (ZOCOR) 20 MG tablet TAKE 1 TABLET BY MOUTH EVERYDAY AT BEDTIME (Patient taking differently: Take 20 mg by mouth at bedtime. ) 90 tablet 2  . SYSTANE ULTRA 0.4-0.3 % SOLN Place 1 drop into both eyes as directed.     No facility-administered medications prior to visit.     Review of Systems:   Constitutional:   No  weight loss, night sweats,  Fevers, chills, + fatigue, or  lassitude.  HEENT:   No headaches,  Difficulty swallowing,  Tooth/dental problems, or  Sore throat,                No sneezing, itching, ear ache, nasal congestion, post nasal drip,   CV:  No chest pain,  Orthopnea, PND, swelling in lower extremities, anasarca, dizziness, palpitations, syncope.   GI  No heartburn, indigestion, abdominal pain, nausea, vomiting, diarrhea, change in bowel habits, loss of appetite, bloody stools.   Resp: No chest wall deformity  Skin: no rash or lesions.  GU: no dysuria, change in color of urine, no urgency or frequency.  No flank pain, no hematuria   MS:  No joint pain or swelling.  No decreased range of motion.  No back pain.    Physical Exam    GEN: A/Ox3; pleasant , NAD, well nourished, on O2    HEENT:  [place]/AT,   , NOSE-clear, THROAT-clear, no lesions, no postnasal drip or exudate noted.   NECK:  Supple w/ fair ROM; no JVD; normal carotid impulses w/o bruits; no thyromegaly or nodules palpated; no lymphadenopathy.    RESP  Clear  P & A; w/o, wheezes/ rales/ or rhonchi. no accessory muscle use, no dullness to percussion  CARD:  RRR, no m/r/g, no peripheral edema, pulses intact, no cyanosis or clubbing.  GI:   Soft & nt; nml bowel sounds; no organomegaly or masses detected.   Musco: Warm bil, no deformities or joint swelling noted.   Neuro: alert, no focal deficits noted.  Skin: Warm, no lesions or rashes    Lab Results:  CBC    Component Value Date/Time   WBC 8.9 01/15/2020 1217   WBC 8.5 12/27/2019 0831   RBC 4.45 01/15/2020 1217   RBC 4.79 12/27/2019 0831   HGB 13.3 01/15/2020 1217   HCT 41.9 01/15/2020 1217   PLT 224 01/15/2020 1217   MCV 94 01/15/2020 1217   MCH 29.9 01/15/2020 1217   MCH 29.4 12/27/2019 0831   MCHC 31.7 01/15/2020 1217   MCHC 31.1 12/27/2019 0831   RDW 14.5 01/15/2020 1217   LYMPHSABS 4.2 (H) 12/03/2009 1123   MONOABS 0.4 12/03/2009 1123   EOSABS 0.1 12/03/2009 1123   BASOSABS 0.1 12/03/2009 1123    BMET    Component Value Date/Time   NA 142 01/15/2020 1217   K 4.1 01/15/2020 1217   CL 108 (H) 01/15/2020 1217   CO2 20 01/15/2020 1217   GLUCOSE 93 01/15/2020 1217   GLUCOSE 124 (H) 12/27/2019 0831   BUN 10 01/15/2020 1217   CREATININE 0.75 01/15/2020 1217   CALCIUM 9.6 01/15/2020 1217   CALCIUM 7.3 (L) 04/18/2008 1710   GFRNONAA 75 01/15/2020 1217   GFRAA 86 01/15/2020 1217    BNP    Component Value Date/Time   BNP 119.4 (H) 12/25/2019 1517    ProBNP No results found for: PROBNP  Imaging: No results found.    No flowsheet data found.  No results found for: NITRICOXIDE      Assessment & Plan:   No problem-specific Assessment & Plan notes found for this encounter.     Rexene Edison, NP 02/05/2020

## 2020-02-05 NOTE — Patient Instructions (Addendum)
Set up for Split night sleep study . Set up for Pulmonary function testing  Continue on Oxygen 3l/m . Goal is to have Oxygen sats >90% .  CT chest as planned in 2 months  Activity as tolerated.  Labs today .  Follow up with Dr. Hermina Staggers or Aldridge Krzyzanowski NP in 6 weeks and As needed   Please contact office for sooner follow up if symptoms do not improve or worsen or seek emergency care

## 2020-02-06 ENCOUNTER — Other Ambulatory Visit: Payer: Self-pay | Admitting: Physician Assistant

## 2020-02-06 DIAGNOSIS — I10 Essential (primary) hypertension: Secondary | ICD-10-CM

## 2020-02-08 NOTE — Assessment & Plan Note (Signed)
Continue on oxygen keep O2 saturations greater than 88 to 90%.

## 2020-02-08 NOTE — Assessment & Plan Note (Signed)
Suspect is multifactorial-we will check PFTs to evaluate for possible underlying obstructive or restrictive lung disease. We will continue work-up for pulmonary hypertension.  Continue on oxygen to keep O2 saturations greater than 88 to 90%.

## 2020-02-08 NOTE — Assessment & Plan Note (Signed)
Pulmonary hypertension questionable etiology. Patient is a former smoker will need pulmonary function testing.  Also has symptoms suggestive of possible underlying sleep apnea with daytime sleepiness and restless sleep.  We will check a sleep study. No evidence of volume overload on exam.  Continue on oxygen to keep O2 saturations greater than 88 to 90%. Autoimmune testing is negative.  We will check a hypersensitivity pneumonitis panel. Once split-night sleep study and PFTs are backing consider referral to cardiology for possible right heart cath if indicated  Plan  Patient Instructions  Set up for Split night sleep study . Set up for Pulmonary function testing  Continue on Oxygen 3l/m . Goal is to have Oxygen sats >90% .  CT chest as planned in 2 months  Activity as tolerated.  Labs today .  Follow up with Dr. Hermina Staggers or Pippa Hanif NP in 6 weeks and As needed   Please contact office for sooner follow up if symptoms do not improve or worsen or seek emergency care

## 2020-02-08 NOTE — Assessment & Plan Note (Signed)
Patient is a former smoker.  Has scattered nodules will need further follow-up.  Check CT chest in 3 months.  Plan  Patient Instructions  Set up for Split night sleep study . Set up for Pulmonary function testing  Continue on Oxygen 3l/m . Goal is to have Oxygen sats >90% .  CT chest as planned in 2 months  Activity as tolerated.  Labs today .  Follow up with Dr. Hermina Staggers or Esa Raden NP in 6 weeks and As needed   Please contact office for sooner follow up if symptoms do not improve or worsen or seek emergency care

## 2020-02-11 ENCOUNTER — Telehealth: Payer: Self-pay

## 2020-02-11 ENCOUNTER — Telehealth: Payer: Self-pay | Admitting: Internal Medicine

## 2020-02-11 NOTE — Telephone Encounter (Signed)
Copied from [place] [id number]. Topic: General - Inquiry >> Feb 11, 2020  2:02 PM Gillis Ends D wrote: Reason for CRM: Patient has some oxygen machines that she got from [employer] and they want except them without a letter from the doctor stating she doesn't need them anymore. She asks that you send the letter to [employer] for her. The daughter Jacinto Halim) called for her mom and can be reached at [phone] if you have any questions. Please advise

## 2020-02-11 NOTE — Telephone Encounter (Signed)
Call placed to patient's daughter, [name] 3 [phone]  to inform her that the order will be sent to [place] to remove the O2.   Patient answered and said that she has received her POC and new room concentrator  from [place]. Explained to her the order will be sent to [place] to remove the O2 equipment that they had provided.

## 2020-02-11 NOTE — Telephone Encounter (Signed)
Order to discontinue O2 faxed to [employer] [facility]

## 2020-02-11 NOTE — Telephone Encounter (Signed)
Will forward to pcp

## 2020-02-15 LAB — HYPERSENSITIVITY PNEUMONITIS
A. Pullulans Abs: NEGATIVE
A.Fumigatus #1 Abs: NEGATIVE
Micropolyspora faeni, IgG: NEGATIVE
Pigeon Serum Abs: NEGATIVE
Thermoact. Saccharii: NEGATIVE
Thermoactinomyces vulgaris, IgG: NEGATIVE

## 2020-02-18 ENCOUNTER — Other Ambulatory Visit: Payer: Self-pay | Admitting: Internal Medicine

## 2020-02-18 DIAGNOSIS — Z1231 Encounter for screening mammogram for malignant neoplasm of breast: Secondary | ICD-10-CM

## 2020-02-27 ENCOUNTER — Other Ambulatory Visit: Payer: Self-pay | Admitting: Internal Medicine

## 2020-02-27 NOTE — Telephone Encounter (Signed)
° °  Notes to clinic:  last filled by a different provider  Review for refill   Requested Prescriptions  Pending Prescriptions Disp Refills   albuterol (VENTOLIN HFA) 108 (90 Base) MCG/ACT inhaler [Pharmacy Med Name: ALBUTEROL HFA (VENTOLIN) INH] 18 each     Sig: TAKE 2 PUFFS BY MOUTH EVERY 6 HOURS AS NEEDED FOR WHEEZE OR SHORTNESS OF BREATH      Pulmonology:  Beta Agonists Failed - 02/27/2020  9:39 AM      Failed - One inhaler should last at least one month. If the patient is requesting refills earlier, contact the patient to check for uncontrolled symptoms.      Passed - Valid encounter within last 12 months    Recent Outpatient Visits           1 month ago Hospital discharge follow-up   [place], MD   5 months ago Essential hypertension   [place], MD   10 months ago Hypothyroidism, unspecified type   [place], MD   12 months ago Stenosis of carotid artery, unspecified laterality   [place], Dionne Bucy, [place]   1 year ago Essential hypertension   [place], Deborah B, MD       Future Appointments             In 3 weeks Laurin Coder, MD [facility]

## 2020-03-12 ENCOUNTER — Telehealth: Payer: Self-pay | Admitting: Internal Medicine

## 2020-03-12 NOTE — Telephone Encounter (Signed)
Received order Dr. Wynetta Emery has it in her fax folder

## 2020-03-12 NOTE — Telephone Encounter (Signed)
[facility] called and They need a new order for pull up due to size change / please advise  They faxed over request and will faxed over again today

## 2020-03-15 ENCOUNTER — Other Ambulatory Visit (HOSPITAL_COMMUNITY)
Admission: RE | Admit: 2020-03-15 | Discharge: 2020-03-15 | Disposition: A | Discharge status: Home / Self Care | Payer: Medicare Other | Source: Ambulatory Visit | Attending: Pulmonary Disease | Admitting: Pulmonary Disease

## 2020-03-15 DIAGNOSIS — Z20822 Contact with and (suspected) exposure to covid-19: Secondary | ICD-10-CM | POA: Diagnosis not present

## 2020-03-15 DIAGNOSIS — Z01812 Encounter for preprocedural laboratory examination: Secondary | ICD-10-CM | POA: Diagnosis not present

## 2020-03-15 LAB — SARS CORONAVIRUS 2 (TAT 6-24 HRS): SARS Coronavirus 2: NEGATIVE

## 2020-03-17 ENCOUNTER — Other Ambulatory Visit: Payer: Self-pay

## 2020-03-17 ENCOUNTER — Ambulatory Visit (HOSPITAL_BASED_OUTPATIENT_CLINIC_OR_DEPARTMENT_OTHER): Payer: Medicare Other | Attending: Adult Health | Admitting: Pulmonary Disease

## 2020-03-17 DIAGNOSIS — Z9981 Dependence on supplemental oxygen: Secondary | ICD-10-CM | POA: Insufficient documentation

## 2020-03-17 DIAGNOSIS — I272 Pulmonary hypertension, unspecified: Secondary | ICD-10-CM | POA: Diagnosis not present

## 2020-03-17 DIAGNOSIS — G4736 Sleep related hypoventilation in conditions classified elsewhere: Secondary | ICD-10-CM | POA: Diagnosis not present

## 2020-03-17 DIAGNOSIS — J9610 Chronic respiratory failure, unspecified whether with hypoxia or hypercapnia: Secondary | ICD-10-CM | POA: Diagnosis not present

## 2020-03-17 DIAGNOSIS — I10 Essential (primary) hypertension: Secondary | ICD-10-CM | POA: Diagnosis not present

## 2020-03-17 DIAGNOSIS — I270 Primary pulmonary hypertension: Secondary | ICD-10-CM

## 2020-03-18 DIAGNOSIS — G4736 Sleep related hypoventilation in conditions classified elsewhere: Secondary | ICD-10-CM | POA: Diagnosis not present

## 2020-03-18 DIAGNOSIS — I270 Primary pulmonary hypertension: Secondary | ICD-10-CM | POA: Diagnosis not present

## 2020-03-18 DIAGNOSIS — R0683 Snoring: Secondary | ICD-10-CM | POA: Diagnosis not present

## 2020-03-18 NOTE — Procedures (Signed)
    Patient Name: [name], [name] Date: 03/17/2020 Gender: Female D.O.B: [birth year] Age (years): 69 Referring Provider: Lynelle Smoke Parrett Height (inches): 40 Interpreting Physician: Chesley Mires MD, ABSM Weight (lbs): 123 RPSGT: Zadie Rhine BMI: 22 MRN: [id number] Neck Size: 14.00  CLINICAL INFORMATION Sleep Study Type: NPSG  Indication for sleep study: Hypertension, Pulmonary Hypertension, Chronic respiratory failure on 3 liters supplemental oxygen.  Epworth Sleepiness Score: 2  SLEEP STUDY TECHNIQUE As per the AASM Manual for the Scoring of Sleep and Associated Events v2.3 (April 2016) with a hypopnea requiring 4% desaturations.  The channels recorded and monitored were frontal, central and occipital EEG, electrooculogram (EOG), submentalis EMG (chin), nasal and oral airflow, thoracic and abdominal wall motion, anterior tibialis EMG, snore microphone, electrocardiogram, and pulse oximetry.  MEDICATIONS Medications self-administered by patient taken the night of the study : N/A  SLEEP ARCHITECTURE Sleep onset time was 5.5 minutes and the sleep efficiency was 999.9%%. The total sleep time was 380 minutes.  Stage REM latency was 5.5 minutes.  The patient spent 100.0%% of the night in stage N1 sleep, 100.0%% in stage N2 sleep, 100.0%% in stage N3 and 5.6% in REM.  Alpha intrusion was absent.  Supine sleep was 5.55%.  RESPIRATORY PARAMETERS The overall apnea/hypopnea index (AHI) was 5.5 per hour. There were 5 total apneas, including 5 obstructive, 5 central and 5 mixed apneas. There were 5 hypopneas and 5 RERAs.  The AHI during Stage REM sleep was 5.5 per hour.  AHI while supine was 5.5 per hour.  The mean oxygen saturation was 100.0%. The minimum SpO2 during sleep was 86.0%.  Study was started with her wearing baseline 3 liters oxygen and increased to 4 liters oxygen.  Soft snoring was noted during this study.  CARDIAC DATA The 2 lead EKG demonstrated sinus  rhythm. The mean heart rate was 100.0 beats per minute. Other EKG findings include: None.  LEG MOVEMENT DATA The total PLMS were 5 with a resulting PLMS index of 5.5. Associated arousal with leg movement index was 5.5 .  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 - She had study started with her using baseline 3 liters supplemental oxygen and then increased to 4 liters. - The patient snored with soft snoring volume.  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 - Avoid alcohol, sedatives and other CNS depressants that may worsen sleep apnea and disrupt normal sleep architecture. - Sleep hygiene should be reviewed to assess factors that may improve sleep quality.  [Electronically signed] 03/18/2020 11:34 AM  Chesley Mires MD, ABSM Diplomate, American Board of Sleep Medicine   NPI: [id number]

## 2020-03-19 ENCOUNTER — Ambulatory Visit (INDEPENDENT_AMBULATORY_CARE_PROVIDER_SITE_OTHER): Payer: Medicare Other | Admitting: Pulmonary Disease

## 2020-03-19 ENCOUNTER — Other Ambulatory Visit: Payer: Self-pay

## 2020-03-19 ENCOUNTER — Encounter: Payer: Self-pay | Admitting: Pulmonary Disease

## 2020-03-19 VITALS — BP 132/80 | HR 94 | Temp 98.0°F | Wt 130.0 lb

## 2020-03-19 DIAGNOSIS — R918 Other nonspecific abnormal finding of lung field: Secondary | ICD-10-CM | POA: Diagnosis not present

## 2020-03-19 DIAGNOSIS — R0602 Shortness of breath: Secondary | ICD-10-CM | POA: Diagnosis not present

## 2020-03-19 NOTE — Patient Instructions (Signed)
Use your albuterol with a spacer device  2 puffs up to 4 times a day as needed  Repeat CT scan of the chest in March 2022-to follow-up on multiple lung nodules  I will see you sometime in April  Call with significant concerns

## 2020-03-19 NOTE — Progress Notes (Signed)
Patient Name: [name], [name] Date: 03/17/2020 Gender: Female D.O.B: 30-Apr-1940 Age (years): 61 Referring Provider: Lynelle Smoke Parrett Height (inches): 42 Interpreting Physician: Chesley Mires MD, ABSM Weight (lbs): 123 RPSGT: Zadie Rhine BMI: 22 MRN: [id number] Neck Size: 14.00  CLINICAL INFORMATION Sleep Study Type: NPSG  Indication for sleep study: Hypertension, Pulmonary Hypertension, Chronic respiratory failure on 3 liters supplemental oxygen.  Epworth Sleepiness Score: 2  SLEEP STUDY TECHNIQUE As per the AASM Manual for the Scoring of Sleep and Associated Events v2.3 (April 2016) with a hypopnea requiring 4% desaturations.  The channels recorded and monitored were frontal, central and occipital EEG, electrooculogram (EOG), submentalis EMG (chin), nasal and oral airflow, thoracic and abdominal wall motion, anterior tibialis EMG, snore microphone, electrocardiogram, and pulse oximetry.  MEDICATIONS Medications self-administered by patient taken the night of the study : N/A  SLEEP ARCHITECTURE The study was initiated at 10:06 and ended at 4:54.  Sleep onset time was 10.5 minutes and the sleep efficiency was 89.6%. The total sleep time was 365 minutes.  Stage REM latency was 43 minutes.  The patient spent 2.3% of the night in stage N1 sleep, 64.7% in stage N2 sleep, 0% in stage N3 and 33% in REM.  Alpha intrusion was absent.  RESPIRATORY PARAMETERS The overall apnea/hypopnea index (AHI) was 0 per hour.  The mean oxygen saturation was 91.4%. The minimum SpO2 during sleep was 86.0%. Study was started with her wearing baseline 3 liters oxygen and increased to 4 liters oxygen.  Soft snoring was noted during this study.  CARDIAC DATA The 2 lead EKG demonstrated sinus rhythm. The mean heart rate was 100.0 beats per minute. Other EKG findings include: None.  LEG MOVEMENT DATA The total PLMS were 5 with a resulting PLMS index of 5.5. Associated arousal with leg movement  index was 5.5 .  IMPRESSIONS - No obstructive sleep apnea. - She had study started with her using baseline 3 liters supplemental oxygen and then increased to 4 liters. - The patient snored with soft snoring volume.  DIAGNOSIS - Snoring. - Nocturnal Hypoxemia.  RECOMMENDATIONS - Continue supplemental oxygen at night. - Avoid alcohol, sedatives and other CNS depressants that may worsen sleep apnea and disrupt normal sleep architecture. - Sleep hygiene should be reviewed to assess factors that may improve sleep quality.  [Electronically signed] 03/19/2020 02:34 PM  Chesley Mires MD, [place], American Board of Sleep Medicine   NPI: [id number]

## 2020-03-19 NOTE — Progress Notes (Signed)
Patient was not able to perform PFT due to comprehension.

## 2020-03-19 NOTE — Progress Notes (Signed)
_0  ID: [name], female    DOB: Feb 22, 1941, 79 y.o.   MRN: [id number]  Chief Complaint  Patient presents with  . Follow-up    Reports unable to complete PFT today. Pt c/o increased dyspnea over the past several days; denies cough/fevers    Referring provider: [name], MD  HPI: Was recently hospitalized for acute respiratory failure and found to have lung nodules  She does feel tight sometimes She uses albuterol as needed  Denies any pain or discomfort at the present time Denies any reflux symptoms  She does not feel acutely ill She has a cough, does not really bringing up any significant secretions No fever, no chills  She is currently on about 3 L of oxygen  TEST/EVENTS :  CT chest December 25, 2019 spiculated 7 mm right upper lobe nodule, 5 mm pulmonary nodule posteriorly, micronodules along the right upper lobe.  Left upper lobe groundglass density  VQ scan December 27, 2019 low probability for PE  2D echo December 26, 2019 EF 0240%, RV systolic function normal.  RV size normal.  Moderately elevated pulmonary artery systolic pressure 60 mmHg.  Venous Doppler negative for DVT  She did have a sleep study a couple of nights ago that I reviewed not showing any significant sleep disordered breathing  10/26 follow up : PNA , O2 RF, pulmonary hypertension Patient presents for a post hospital follow-up.  Patient was recently seen in the hospital for pulmonary consult for acute respiratory distress, shortness of breath hypoxia and CT chest with scattered lung nodules.  She was treated for suspected pneumonia with antibiotics and steroids.  Started on oxygen at discharge at 3 L.  Patient CT chest showed scattered nodules in the right upper lobe and left upper lobe.  Patient is a former smoker. Patient says she is retired.  She lives at home and is active.  She noticed around June of this year that she started having some worsening shortness of breath with  activities.  Around August symptoms progressively worsened.  She has no significant cough or leg swelling.  No orthopnea. 2D echo during hospitalization showed pulmonary hypertension.. She does have some restless sleep and daytime sleepiness.  BMI is 22.  Patient is a former smoker. Autoimmune testing was negative except for sed rate was elevated at 46  weight is down approximately 10 pounds over the last year  Allergies  Allergen Reactions  . Meclizine Nausea And Vomiting     There is no immunization history on file for this patient.  Past Medical History:  Diagnosis Date  . Allergy   . Arthritis   . Carotid artery occlusion   . GERD (gastroesophageal reflux disease)   . Hypertension   . Joint pain   . Thyroid disease    Tobacco History: Social History   Tobacco Use  Smoking Status Former Smoker  . Packs/day: 0.30  . Years: 5.00  . Pack years: 1.50  . Types: Cigarettes  . Quit date: 04/12/1980  . Years since quitting: 39.9  Smokeless Tobacco Never Used   Counseling given: Not Answered  Outpatient Medications Prior to Visit  Medication Sig Dispense Refill  . acetaminophen (TYLENOL) 325 MG tablet Take 650 mg every 6 (six) hours as needed by mouth for mild pain or headache.     . albuterol (VENTOLIN HFA) 108 (90 Base) MCG/ACT inhaler TAKE 2 PUFFS BY MOUTH EVERY 6 HOURS AS NEEDED FOR WHEEZE OR SHORTNESS OF BREATH 18 each 2  .  amLODipine (NORVASC) 5 MG tablet TAKE 1 TABLET BY MOUTH EVERY DAY (Patient taking differently: Take 5 mg by mouth daily. ) 90 tablet 1  . aspirin 81 MG chewable tablet Chew 81 mg by mouth daily.    . calcium-vitamin D (OSCAL WITH D) 500-200 MG-UNIT per tablet Take 1 tablet by mouth daily with breakfast.    . cetirizine (ZYRTEC) 10 MG tablet Take 10 mg daily as needed by mouth for allergies.     Marland Kitchen clopidogrel (PLAVIX) 75 MG tablet TAKE 1 TABLET BY MOUTH EVERY DAY (Patient taking differently: Take 75 mg by mouth daily. ) 90 tablet 1  . diazepam (VALIUM) 2  MG tablet Take 0.5 tablets (1 mg total) by mouth every 12 (twelve) hours as needed (vertigo). 15 tablet 1  . diclofenac Sodium (VOLTAREN) 1 % GEL Apply 2 g topically 4 (four) times daily. 100 g 0  . docusate sodium ([employer] STOOL SOFTENER) 100 MG capsule TAKE 1 CAPSULE (100 MG TOTAL) BY MOUTH DAILY AS NEEDED FOR MILD CONSTIPATION. 30 capsule 2  . fluticasone (FLONASE) 50 MCG/ACT nasal spray Place 1 spray into both nostrils daily as needed for allergies or rhinitis (for sinus congestion). 48 mL 3  . gabapentin (NEURONTIN) 100 MG capsule TAKE 1 CAPSULE (100 MG TOTAL) BY MOUTH AT BEDTIME. 90 capsule 1  . latanoprost (XALATAN) 0.005 % ophthalmic solution Place 1 drop at bedtime into both eyes.    Marland Kitchen levothyroxine (SYNTHROID) 50 MCG tablet TAKE 1 TABLET BY MOUTH EVERY DAY 90 tablet 3  . lisinopril (ZESTRIL) 20 MG tablet TAKE 1 TABLET BY MOUTH EVERY DAY 90 tablet 0  . metoprolol succinate (TOPROL-XL) 25 MG 24 hr tablet TAKE 1 TABLET BY MOUTH EVERY DAY (Patient taking differently: Take 25 mg by mouth daily. ) 30 tablet 1  . Multiple Vitamins-Minerals (ALIVE WOMENS ENERGY) TABS Take 1 tablet by mouth daily.    . Probiotic Product (PROBIOTIC PO) Take 1 capsule by mouth daily.    . simvastatin (ZOCOR) 20 MG tablet TAKE 1 TABLET BY MOUTH EVERYDAY AT BEDTIME (Patient taking differently: Take 20 mg by mouth at bedtime. ) 90 tablet 2  . SYSTANE ULTRA 0.4-0.3 % SOLN Place 1 drop into both eyes as directed.     No facility-administered medications prior to visit.     Review of Systems:  Cough, some congestion No chest pain or chest discomfort Denies any significant heartburn No skin rash  Physical Exam  Elderly lady, does not appear to be in distress Wheelchair borne S1-S2 appreciated Equal movement bilaterally with decreased air movement at the bases  Lab Results:  CBC    Component Value Date/Time   WBC 8.9 01/15/2020 1217   WBC 8.5 12/27/2019 0831   RBC 4.45 01/15/2020 1217   RBC 4.79 12/27/2019  0831   HGB 13.3 01/15/2020 1217   HCT 41.9 01/15/2020 1217   PLT 224 01/15/2020 1217   MCV 94 01/15/2020 1217   MCH 29.9 01/15/2020 1217   MCH 29.4 12/27/2019 0831   MCHC 31.7 01/15/2020 1217   MCHC 31.1 12/27/2019 0831   RDW 14.5 01/15/2020 1217   LYMPHSABS 4.2 (H) 12/03/2009 1123   MONOABS 0.4 12/03/2009 1123   EOSABS 0.1 12/03/2009 1123   BASOSABS 0.1 12/03/2009 1123    BMET    Component Value Date/Time   NA 142 01/15/2020 1217   K 4.1 01/15/2020 1217   CL 108 (H) 01/15/2020 1217   CO2 20 01/15/2020 1217   GLUCOSE 93 01/15/2020 1217  GLUCOSE 124 (H) 12/27/2019 0831   BUN 10 01/15/2020 1217   CREATININE 0.75 01/15/2020 1217   CALCIUM 9.6 01/15/2020 1217   CALCIUM 7.3 (L) 04/18/2008 1710   GFRNONAA 75 01/15/2020 1217   GFRAA 86 01/15/2020 1217    BNP    Component Value Date/Time   BNP 119.4 (H) 12/25/2019 1517    ProBNP No results found for: PROBNP  Imaging: Recent sleep study reviewed-result pending  CT chest reviewed with patient and her daughter  Assessment & Plan:  .  Patient with scattered lung nodules on CT .  Will need follow-up repeat CT  .  Unable to perform pulmonary function test today -Unable to follow instructions will  Inhaler use technique reviewed .  Poor technique .  Spacer was provided and teachings  Chronic respiratory failure .  Continue oxygen supplementation at 3 L .  Goal is to keep saturations greater than 90% at all times  .  Repeat CT scan of the chest will be ordered for March  .  Follow-up in about March or April  .  We will follow up on sleep study results.  .   encouraged to call as needed  Laurin Coder, MD 03/19/2020

## 2020-03-27 ENCOUNTER — Ambulatory Visit
Admission: RE | Admit: 2020-03-27 | Discharge: 2020-03-27 | Disposition: A | Discharge status: Home / Self Care | Payer: Medicare Other | Source: Ambulatory Visit | Attending: Internal Medicine | Admitting: Internal Medicine

## 2020-03-27 ENCOUNTER — Other Ambulatory Visit: Payer: Self-pay

## 2020-03-27 ENCOUNTER — Other Ambulatory Visit: Payer: Self-pay | Admitting: Internal Medicine

## 2020-03-27 DIAGNOSIS — Z1231 Encounter for screening mammogram for malignant neoplasm of breast: Secondary | ICD-10-CM | POA: Diagnosis not present

## 2020-03-27 DIAGNOSIS — I6529 Occlusion and stenosis of unspecified carotid artery: Secondary | ICD-10-CM

## 2020-04-02 ENCOUNTER — Telehealth: Payer: Self-pay

## 2020-04-02 NOTE — Telephone Encounter (Signed)
Contacted pt to go over mm results pt is aware and doesn't have any questions or concerns

## 2020-04-21 ENCOUNTER — Other Ambulatory Visit: Payer: Self-pay | Admitting: Internal Medicine

## 2020-04-21 DIAGNOSIS — I10 Essential (primary) hypertension: Secondary | ICD-10-CM

## 2020-04-29 ENCOUNTER — Encounter (HOSPITAL_COMMUNITY): Payer: Self-pay | Admitting: *Deleted

## 2020-04-29 ENCOUNTER — Emergency Department (HOSPITAL_COMMUNITY)
Admission: EM | Admit: 2020-04-29 | Discharge: 2020-04-30 | Disposition: A | Discharge status: Home / Self Care | Payer: Medicare Other | Source: Home / Self Care | Attending: Emergency Medicine | Admitting: Emergency Medicine

## 2020-04-29 ENCOUNTER — Emergency Department (HOSPITAL_COMMUNITY): Payer: Medicare Other

## 2020-04-29 DIAGNOSIS — U071 COVID-19: Secondary | ICD-10-CM

## 2020-04-29 DIAGNOSIS — Z87891 Personal history of nicotine dependence: Secondary | ICD-10-CM | POA: Insufficient documentation

## 2020-04-29 DIAGNOSIS — R069 Unspecified abnormalities of breathing: Secondary | ICD-10-CM | POA: Diagnosis not present

## 2020-04-29 DIAGNOSIS — E039 Hypothyroidism, unspecified: Secondary | ICD-10-CM | POA: Insufficient documentation

## 2020-04-29 DIAGNOSIS — R059 Cough, unspecified: Secondary | ICD-10-CM

## 2020-04-29 DIAGNOSIS — I10 Essential (primary) hypertension: Secondary | ICD-10-CM | POA: Insufficient documentation

## 2020-04-29 DIAGNOSIS — I700 Atherosclerosis of aorta: Secondary | ICD-10-CM | POA: Diagnosis not present

## 2020-04-29 DIAGNOSIS — R918 Other nonspecific abnormal finding of lung field: Secondary | ICD-10-CM | POA: Diagnosis not present

## 2020-04-29 LAB — RESP PANEL BY RT-PCR (FLU A&B, COVID) ARPGX2
Influenza A by PCR: NEGATIVE
Influenza B by PCR: NEGATIVE
SARS Coronavirus 2 by RT PCR: POSITIVE — AB

## 2020-04-29 NOTE — ED Notes (Signed)
Pt SpO2 88% on 4L home oxygen. Pt switched to ED O2 tank at 4 L, pt 92%.

## 2020-04-29 NOTE — ED Triage Notes (Signed)
To ED for eval of cough that started this am. Pt states she also have a 'runny nose'. States she hasn't been out of her home but has had a few covid tests in the past. Pt is on 3L [place] around the clock. Pt denies cp. Denies being SOB with her oxygen on. Pt lives with her daughter. Alert and oriented. Pt states she is very independent - walks independently, cooks, and cleans her home.

## 2020-04-30 ENCOUNTER — Telehealth: Payer: Self-pay

## 2020-04-30 ENCOUNTER — Other Ambulatory Visit: Payer: Self-pay | Admitting: Adult Health

## 2020-04-30 DIAGNOSIS — U071 COVID-19: Secondary | ICD-10-CM

## 2020-04-30 NOTE — Discharge Instructions (Signed)
You were seen in the emergency department today with cough.  Your COVID test does come back positive.  You may take Tylenol as needed for fever and body aches.  Please continue your home oxygen and monitor your symptoms closely.  I have placed a referral for the monoclonal antibody infusion center to look through your chart and discussed with you receiving a monoclonal antibody infusion.  They will be calling in the next 24 to 48 hours.  Please remain in isolation for the next 5 days.  If you are feeling better without fever you can leave quarantine but must wear a mask per CDC guidelines.   If you begin to develop chest pain, confusion, shortness of breath, other sudden/severe symptoms please return to the emergency department for reevaluation.

## 2020-04-30 NOTE — Telephone Encounter (Signed)
Call placed to patient to schedule ED follow up visit.  Scheduled her for tele/virtual appointment 05/08/2020.  She said she is feeling good, no pain. No complaints.  Reminded her that she has an appointment at [facility] infusion tomorrow and it is very important that she keep that appointment and she said she was aware.

## 2020-04-30 NOTE — ED Provider Notes (Signed)
Emergency Department Provider Note   I have reviewed the triage vital signs and the nursing notes.   HISTORY  Chief Complaint Cough   HPI [name] is a 80 y.o. female with past medical history of chronic respiratory failure on 3 L nasal cannula secondary to pulmonary hypertension presents to the emergency department with cough starting yesterday.  She has not had fever but has had some body aches.  No diarrhea.  No vomiting.  She is not experiencing chest pain or shortness of breath.  She has not been vaccinated for COVID but is scheduled to get her vaccine later in the week.  No radiation of symptoms or other modifying factors.  She states that other than her runny nose she is feeling "fine."    Past Medical History:  Diagnosis Date  . Allergy   . Arthritis   . Carotid artery occlusion   . GERD (gastroesophageal reflux disease)   . Hypertension   . Joint pain   . Thyroid disease     Patient Active Problem List   Diagnosis Date Noted  . Pulmonary hypertension ([place]) 01/15/2020  . Hypoxemic respiratory failure, chronic ([place]) 01/15/2020  . Lung nodule, multiple 01/15/2020  . 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 SOB (shortness of breath) 12/26/2019  . Acute on chronic respiratory failure with hypoxia ([place]) 12/25/2019  . Acute respiratory failure with hypoxemia ([place]) 12/25/2019  . Essential hypertension 06/06/2017  . Hyperlipidemia 06/06/2017  . Leg cramps 06/06/2017  . Vertigo 06/06/2017  . Macular degeneration 06/06/2017  . Hypothyroidism 06/06/2017  . Carotid stenosis 01/22/2014  . Aftercare following surgery of the circulatory system, [place] 01/02/2013  . Occlusion and stenosis of carotid artery without mention of cerebral infarction 04/27/2011    Past Surgical History:  Procedure Laterality Date  . ABDOMINAL HYSTERECTOMY    . AORTIC ARCH ANGIOGRAPHY N/A 03/01/2017   Procedure: AORTIC ARCH ANGIOGRAPHY;  Surgeon: Serafina [name], MD;  Location: [place] [facility];  Service: Cardiovascular;  Laterality: N/A;  . ARCH AORTOGRAM N/A 05/04/2011   Procedure: ARCH AORTOGRAM;  Surgeon: Serafina [name], MD;  Location: [facility] CATH LAB;  Service: Cardiovascular;  Laterality: N/A;  . ARCH AORTOGRAM N/A 07/04/2012   Procedure: ARCH AORTOGRAM;  Surgeon: Serafina [name], MD;  Location: [facility] CATH LAB;  Service: Cardiovascular;  Laterality: N/A;  . CAROTID ANGIOGRAM  July 04, 2012  . CAROTID ANGIOGRAM N/A 05/04/2011   Procedure: CAROTID ANGIOGRAM;  Surgeon: Serafina [name], MD;  Location: [facility] CATH LAB;  Service: Cardiovascular;  Laterality: N/A;  . CAROTID ANGIOGRAM N/A 07/04/2012   Procedure: CAROTID ANGIOGRAM;  Surgeon: Serafina [name], MD;  Location: [facility] CATH LAB;  Service: Cardiovascular;  Laterality: N/A;  . CAROTID STENT INSERTION Right 05/11/2011   Procedure: CAROTID STENT INSERTION;  Surgeon: Serafina [name], MD;  Location: [facility] CATH LAB;  Service: Cardiovascular;  Laterality: Right;  . carotid subclavian anastomosis  05/11/11   Right subclav. artery transected 12/17/09  . CATARACT EXTRACTION     bilateral  . EYE SURGERY      Allergies Meclizine  Family History  Problem Relation Age of Onset  . Heart failure Mother   . Cancer Father   . Breast cancer Neg Hx     Social History Social History   Tobacco Use  . Smoking status: Former Smoker    Packs/day: 0.30    Years: 5.00    Pack years: 1.50    Types: Cigarettes  Quit date: 04/12/1980    Years since quitting: 40.0  . Smokeless tobacco: Never Used  Vaping Use  . Vaping Use: Never used  Substance Use Topics  . Alcohol use: No  . Drug use: No    Review of Systems  Constitutional: No fever/chills. Mild body aches.  Eyes: No visual changes. ENT: No sore throat. Nasal congestion.  Cardiovascular: Denies chest pain. Respiratory: Denies shortness of breath. Gastrointestinal: No abdominal pain.  No nausea, no vomiting.  No diarrhea.  No constipation. Genitourinary: Negative for  dysuria. Musculoskeletal: Negative for back pain. Skin: Negative for rash. Neurological: Negative for headaches, focal weakness or numbness.  10-point ROS otherwise negative.  ____________________________________________   PHYSICAL EXAM:  VITAL SIGNS: ED Triage Vitals  Enc Vitals Group     BP 04/29/20 1743 (!) 143/59     Pulse Rate 04/29/20 1743 (!) 114     Resp 04/29/20 1743 20     Temp 04/29/20 1743 100 F (37.8 C)     Temp Source 04/29/20 1743 Oral     SpO2 04/29/20 1743 (!) 88 %   Constitutional: Alert and oriented. Well appearing and in no acute distress. Eyes: Conjunctivae are normal. Head: Atraumatic. Nose: No congestion/rhinnorhea. Mouth/Throat: Mucous membranes are moist.   Neck: No stridor.   Cardiovascular: Normal rate, regular rhythm. Good peripheral circulation. Grossly normal heart sounds.   Respiratory: Normal respiratory effort.  No retractions. Lungs CTAB. Gastrointestinal: Soft and nontender. No distention.  Musculoskeletal: No lower extremity tenderness nor edema. No gross deformities of extremities. Neurologic:  Normal speech and language. No gross focal neurologic deficits are appreciated.  Skin:  Skin is warm, dry and intact. No rash noted.  ____________________________________________   LABS (all labs ordered are listed, but only abnormal results are displayed)  Labs Reviewed  RESP PANEL BY RT-PCR (FLU A&B, COVID) ARPGX2 - Abnormal; Notable for the following components:      Result Value   SARS Coronavirus 2 by RT PCR POSITIVE (*)    All other components within normal limits   ____________________________________________  EKG   EKG Interpretation  Date/Time:  Tuesday April 29 2020 18:15:34 EST Ventricular Rate:  112 PR Interval:    QRS Duration: 72 QT Interval:  460 QTC Calculation: 627 R Axis:   21 Text Interpretation: Sinus rhythm ST & T wave abnormality, consider inferolateral ischemia Prolonged QT Abnormal ECG Confirmed by Nanda Quinton [phone]) on 04/30/2020 3:48:14 AM       ____________________________________________  RADIOLOGY  DG Chest Portable 1 View  Result Date: 04/29/2020 CLINICAL DATA:  COVID EXAM: PORTABLE CHEST 1 VIEW COMPARISON:  12/27/2019, 12/25/2019 FINDINGS: Mild diffuse reticular changes likely due to chronic interstitial changes. No acute superimposed focal airspace disease. No effusion. Stable cardiomediastinal silhouette with aortic atherosclerosis. No pneumothorax IMPRESSION: Chronic interstitial changes without acute superimposed airspace disease. Electronically Signed   By: Donavan Foil M.D.   On: 04/29/2020 22:18    ____________________________________________   PROCEDURES  Procedure(s) performed:   Procedures  None  ____________________________________________   INITIAL IMPRESSION / ASSESSMENT AND PLAN / ED COURSE  Pertinent labs & imaging results that were available during my care of the patient were reviewed by me and considered in my medical decision making (see chart for details).   Patient presents emergency department nasal congestion and mild body aches.  She was tested for COVID-19 in triage and came back positive.  Chest x-ray shows chronic interstitial changes but no acute process.  EKG is similar to prior tracings.  She is not experiencing chest pain or shortness of breath to suspect ACS or PE.  She is on 3 L nasal cannula chronically and that is unchanged here.  She is currently day 2 of symptoms.  She is unvaccinated.  I have placed a referral to the monoclonal antibody infusion center for restratification and consideration of monoclonal antibodies.   Discussed symptom progression, mgmt at home, and quarantine per [facility] guidelines.   [name] was evaluated in Emergency Department on 04/30/2020 for the symptoms described in the history of present illness. She was evaluated in the context of the global COVID-19 pandemic, which necessitated consideration that the  patient might be at risk for infection with the SARS-CoV-2 virus that causes COVID-19. Institutional protocols and algorithms that pertain to the evaluation of patients at risk for COVID-19 are in a state of rapid change based on information released by regulatory bodies including the CDC and federal and state organizations. These policies and algorithms were followed during the patient's care in the ED.  ____________________________________________  FINAL CLINICAL IMPRESSION(S) / ED DIAGNOSES  Final diagnoses:  COVID-19  Cough    Note:  This document was prepared using Dragon voice recognition software and may include unintentional dictation errors.  Nanda Quinton, MD, [facility] Emergency Medicine    Elford Evilsizer, [facility], MD 04/30/20 [phone]

## 2020-04-30 NOTE — Progress Notes (Signed)
I connected by phone with [name] on 04/30/2020 at 10:31 AM to discuss the potential use of a new treatment for mild to moderate COVID-19 viral infection in non-hospitalized patients.  This patient is a 80 y.o. female that meets the FDA criteria for Emergency Use Authorization of COVID monoclonal antibody sotrovimab.  Has a (+) direct SARS-CoV-2 viral test result  Has mild or moderate COVID-19   Is NOT hospitalized due to COVID-19  Is within 10 days of symptom onset  Has at least one of the high risk factor(s) for progression to severe COVID-19 and/or hospitalization as defined in EUA.  Specific high risk criteria : Older age (>/= 82 yo), Cardiovascular disease or hypertension and Chronic Lung Disease   I have spoken and communicated the following to the patient or parent/caregiver regarding COVID monoclonal antibody treatment:  1. FDA has authorized the emergency use for the treatment of mild to moderate COVID-19 in adults and pediatric patients with positive results of direct SARS-CoV-2 viral testing who are 69 years of age and older weighing at least 40 kg, and who are at high risk for progressing to severe COVID-19 and/or hospitalization.  2. The significant known and potential risks and benefits of COVID monoclonal antibody, and the extent to which such potential risks and benefits are unknown.  3. Information on available alternative treatments and the risks and benefits of those alternatives, including clinical trials.  4. Patients treated with COVID monoclonal antibody should continue to self-isolate and use infection control measures (e.g., wear mask, isolate, social distance, avoid sharing personal items, clean and disinfect "high touch" surfaces, and frequent handwashing) according to CDC guidelines.   5. The patient or parent/caregiver has the option to accept or refuse COVID monoclonal antibody treatment.  After reviewing this information with the patient, the patient  has agreed to receive one of the available covid 19 monoclonal antibodies and will be provided an appropriate fact sheet prior to infusion. Scot Dock, NP 04/30/2020 10:31 AM

## 2020-05-01 ENCOUNTER — Encounter (HOSPITAL_COMMUNITY): Payer: Self-pay

## 2020-05-01 ENCOUNTER — Emergency Department (HOSPITAL_COMMUNITY): Payer: Medicare Other

## 2020-05-01 ENCOUNTER — Inpatient Hospital Stay (HOSPITAL_COMMUNITY)
Admission: EM | Admit: 2020-05-01 | Discharge: 2020-05-04 | DRG: 177 | Disposition: A | Discharge status: Home / Self Care | Payer: Medicare Other | Attending: Internal Medicine | Admitting: Internal Medicine

## 2020-05-01 ENCOUNTER — Inpatient Hospital Stay (HOSPITAL_COMMUNITY): Payer: Medicare Other

## 2020-05-01 ENCOUNTER — Other Ambulatory Visit: Payer: Self-pay

## 2020-05-01 ENCOUNTER — Ambulatory Visit (HOSPITAL_COMMUNITY)
Admission: RE | Admit: 2020-05-01 | Discharge: 2020-05-01 | Disposition: A | Discharge status: Home / Self Care | Payer: Medicare Other | Source: Ambulatory Visit | Attending: Pulmonary Disease | Admitting: Pulmonary Disease

## 2020-05-01 DIAGNOSIS — Z8249 Family history of ischemic heart disease and other diseases of the circulatory system: Secondary | ICD-10-CM

## 2020-05-01 DIAGNOSIS — J9621 Acute and chronic respiratory failure with hypoxia: Secondary | ICD-10-CM | POA: Diagnosis present

## 2020-05-01 DIAGNOSIS — Z79899 Other long term (current) drug therapy: Secondary | ICD-10-CM | POA: Diagnosis not present

## 2020-05-01 DIAGNOSIS — U071 COVID-19: Principal | ICD-10-CM | POA: Diagnosis present

## 2020-05-01 DIAGNOSIS — Z79890 Hormone replacement therapy (postmenopausal): Secondary | ICD-10-CM

## 2020-05-01 DIAGNOSIS — I272 Pulmonary hypertension, unspecified: Secondary | ICD-10-CM | POA: Diagnosis present

## 2020-05-01 DIAGNOSIS — I10 Essential (primary) hypertension: Secondary | ICD-10-CM | POA: Diagnosis present

## 2020-05-01 DIAGNOSIS — R0602 Shortness of breath: Secondary | ICD-10-CM | POA: Diagnosis not present

## 2020-05-01 DIAGNOSIS — K5900 Constipation, unspecified: Secondary | ICD-10-CM | POA: Diagnosis present

## 2020-05-01 DIAGNOSIS — Z888 Allergy status to other drugs, medicaments and biological substances status: Secondary | ICD-10-CM

## 2020-05-01 DIAGNOSIS — R918 Other nonspecific abnormal finding of lung field: Secondary | ICD-10-CM | POA: Diagnosis present

## 2020-05-01 DIAGNOSIS — E785 Hyperlipidemia, unspecified: Secondary | ICD-10-CM | POA: Diagnosis present

## 2020-05-01 DIAGNOSIS — K219 Gastro-esophageal reflux disease without esophagitis: Secondary | ICD-10-CM | POA: Diagnosis present

## 2020-05-01 DIAGNOSIS — R0902 Hypoxemia: Secondary | ICD-10-CM

## 2020-05-01 DIAGNOSIS — Z9981 Dependence on supplemental oxygen: Secondary | ICD-10-CM | POA: Diagnosis not present

## 2020-05-01 DIAGNOSIS — Z87891 Personal history of nicotine dependence: Secondary | ICD-10-CM | POA: Diagnosis not present

## 2020-05-01 DIAGNOSIS — M1990 Unspecified osteoarthritis, unspecified site: Secondary | ICD-10-CM | POA: Diagnosis present

## 2020-05-01 DIAGNOSIS — R911 Solitary pulmonary nodule: Secondary | ICD-10-CM | POA: Diagnosis not present

## 2020-05-01 DIAGNOSIS — E039 Hypothyroidism, unspecified: Secondary | ICD-10-CM | POA: Diagnosis present

## 2020-05-01 DIAGNOSIS — Z7982 Long term (current) use of aspirin: Secondary | ICD-10-CM | POA: Diagnosis not present

## 2020-05-01 LAB — COMPREHENSIVE METABOLIC PANEL
ALT: 35 U/L (ref 0–44)
AST: 44 U/L — ABNORMAL HIGH (ref 15–41)
Albumin: 4 g/dL (ref 3.5–5.0)
Alkaline Phosphatase: 74 U/L (ref 38–126)
Anion gap: 11 (ref 5–15)
BUN: 12 mg/dL (ref 8–23)
CO2: 23 mmol/L (ref 22–32)
Calcium: 9.4 mg/dL (ref 8.9–10.3)
Chloride: 106 mmol/L (ref 98–111)
Creatinine, Ser: 0.92 mg/dL (ref 0.44–1.00)
GFR, Estimated: >60 mL/min (ref >60)
Glucose, Bld: 103 mg/dL — ABNORMAL HIGH (ref 70–99)
Potassium: 3.7 mmol/L (ref 3.5–5.1)
Sodium: 140 mmol/L (ref 135–145)
Total Bilirubin: 0.4 mg/dL (ref 0.3–1.2)
Total Protein: 8.6 g/dL — ABNORMAL HIGH (ref 6.5–8.1)

## 2020-05-01 LAB — CBC WITH DIFFERENTIAL/PLATELET
Abs Immature Granulocytes: 0.01 10*3/uL (ref 0.00–0.07)
Basophils Absolute: 0 10*3/uL (ref 0.0–0.1)
Basophils Relative: 1 %
Eosinophils Absolute: 0 10*3/uL (ref 0.0–0.5)
Eosinophils Relative: 1 %
HCT: 39.8 % (ref 36.0–46.0)
Hemoglobin: 12.6 g/dL (ref 12.0–15.0)
Immature Granulocytes: 0 %
Lymphocytes Relative: 46 %
Lymphs Abs: 2.9 10*3/uL (ref 0.7–4.0)
MCH: 31.9 pg (ref 26.0–34.0)
MCHC: 31.7 g/dL (ref 30.0–36.0)
MCV: 100.8 fL — ABNORMAL HIGH (ref 80.0–100.0)
Monocytes Absolute: 0.5 10*3/uL (ref 0.1–1.0)
Monocytes Relative: 9 %
Neutro Abs: 2.6 10*3/uL (ref 1.7–7.7)
Neutrophils Relative %: 43 %
Platelets: 214 10*3/uL (ref 150–400)
RBC: 3.95 MIL/uL (ref 3.87–5.11)
RDW: 14.5 % (ref 11.5–15.5)
WBC: 6.1 10*3/uL (ref 4.0–10.5)
nRBC: 0 % (ref 0.0–0.2)

## 2020-05-01 LAB — BRAIN NATRIURETIC PEPTIDE: B Natriuretic Peptide: 80.9 pg/mL (ref 0.0–100.0)

## 2020-05-01 LAB — TRIGLYCERIDES: Triglycerides: 84 mg/dL (ref <150)

## 2020-05-01 LAB — C-REACTIVE PROTEIN: CRP: 0.6 mg/dL (ref <1.0)

## 2020-05-01 LAB — FIBRINOGEN: Fibrinogen: 478 mg/dL — ABNORMAL HIGH (ref 210–475)

## 2020-05-01 LAB — PROCALCITONIN: Procalcitonin: <0.1 ng/mL

## 2020-05-01 LAB — D-DIMER, QUANTITATIVE: D-Dimer, Quant: 1.1 ug/mL-FEU — ABNORMAL HIGH (ref 0.00–0.50)

## 2020-05-01 LAB — LACTATE DEHYDROGENASE: LDH: 218 U/L — ABNORMAL HIGH (ref 98–192)

## 2020-05-01 LAB — FERRITIN: Ferritin: 118 ng/mL (ref 11–307)

## 2020-05-01 LAB — LACTIC ACID, PLASMA: Lactic Acid, Venous: 1.2 mmol/L (ref 0.5–1.9)

## 2020-05-01 MED ORDER — METHYLPREDNISOLONE SODIUM SUCC 125 MG IJ SOLR: 125.0000 mg | Freq: Once | INTRAMUSCULAR | Status: DC | PRN

## 2020-05-01 MED ORDER — FLUTICASONE PROPIONATE 50 MCG/ACT NA SUSP
1.0000 | Freq: Every day | NASAL | Status: DC | PRN
  Filled 2020-05-01: qty 16

## 2020-05-01 MED ORDER — ENOXAPARIN SODIUM 40 MG/0.4ML ~~LOC~~ SOLN
40.0000 mg | SUBCUTANEOUS | Status: DC
  Administered 2020-05-01 – 2020-05-03 (×3): 40 mg via SUBCUTANEOUS
  Filled 2020-05-01 (×3): qty 0.4

## 2020-05-01 MED ORDER — ORAL CARE MOUTH RINSE
15.0000 mL | Freq: Two times a day (BID) | OROMUCOSAL | Status: DC
  Administered 2020-05-02 – 2020-05-04 (×3): 15 mL via OROMUCOSAL

## 2020-05-01 MED ORDER — ALBUTEROL SULFATE HFA 108 (90 BASE) MCG/ACT IN AERS: 2.0000 | INHALATION_SPRAY | Freq: Once | RESPIRATORY_TRACT | Status: DC | PRN

## 2020-05-01 MED ORDER — SIMVASTATIN 20 MG PO TABS
20.0000 mg | ORAL_TABLET | Freq: Every day | ORAL | Status: DC
  Administered 2020-05-01 – 2020-05-03 (×3): 20 mg via ORAL
  Filled 2020-05-01 (×3): qty 1

## 2020-05-01 MED ORDER — ALBUTEROL SULFATE HFA 108 (90 BASE) MCG/ACT IN AERS: 2.0000 | INHALATION_SPRAY | Freq: Four times a day (QID) | RESPIRATORY_TRACT | Status: DC | PRN

## 2020-05-01 MED ORDER — ZINC SULFATE 220 (50 ZN) MG PO CAPS
220.0000 mg | ORAL_CAPSULE | Freq: Every day | ORAL | Status: DC
  Administered 2020-05-01 – 2020-05-04 (×4): 220 mg via ORAL
  Filled 2020-05-01 (×4): qty 1

## 2020-05-01 MED ORDER — DIPHENHYDRAMINE HCL 50 MG/ML IJ SOLN: 50.0000 mg | Freq: Once | INTRAMUSCULAR | Status: DC | PRN

## 2020-05-01 MED ORDER — ASCORBIC ACID 500 MG PO TABS
500.0000 mg | ORAL_TABLET | Freq: Every day | ORAL | Status: DC
  Administered 2020-05-01 – 2020-05-04 (×4): 500 mg via ORAL
  Filled 2020-05-01 (×4): qty 1

## 2020-05-01 MED ORDER — EPINEPHRINE 0.3 MG/0.3ML IJ SOAJ: 0.3000 mg | Freq: Once | INTRAMUSCULAR | Status: DC | PRN

## 2020-05-01 MED ORDER — LATANOPROST 0.005 % OP SOLN
1.0000 [drp] | Freq: Every day | OPHTHALMIC | Status: DC
  Administered 2020-05-01 – 2020-05-03 (×3): 1 [drp] via OPHTHALMIC
  Filled 2020-05-01: qty 2.5

## 2020-05-01 MED ORDER — LEVOTHYROXINE SODIUM 50 MCG PO TABS
50.0000 ug | ORAL_TABLET | Freq: Every day | ORAL | Status: DC
  Administered 2020-05-02 – 2020-05-04 (×3): 50 ug via ORAL
  Filled 2020-05-01 (×3): qty 1

## 2020-05-01 MED ORDER — DIAZEPAM 2 MG PO TABS: 1.0000 mg | ORAL_TABLET | Freq: Two times a day (BID) | ORAL | Status: DC | PRN

## 2020-05-01 MED ORDER — FAMOTIDINE IN NACL 20-0.9 MG/50ML-% IV SOLN: 20.0000 mg | Freq: Once | INTRAVENOUS | Status: DC | PRN

## 2020-05-01 MED ORDER — SODIUM CHLORIDE 0.9 % IV SOLN
INTRAVENOUS | Status: DC | PRN
  Administered 2020-05-01: 500 mL via INTRAVENOUS

## 2020-05-01 MED ORDER — METOPROLOL SUCCINATE ER 25 MG PO TB24
25.0000 mg | ORAL_TABLET | Freq: Every day | ORAL | Status: DC
  Administered 2020-05-02 – 2020-05-04 (×3): 25 mg via ORAL
  Filled 2020-05-01 (×3): qty 1

## 2020-05-01 MED ORDER — ACETAMINOPHEN 325 MG PO TABS
650.0000 mg | ORAL_TABLET | Freq: Four times a day (QID) | ORAL | Status: DC | PRN
  Administered 2020-05-02: 650 mg via ORAL
  Filled 2020-05-01 (×2): qty 2

## 2020-05-01 MED ORDER — IOHEXOL 350 MG/ML SOLN
100.0000 mL | Freq: Once | INTRAVENOUS | Status: AC | PRN
  Administered 2020-05-01: 75 mL via INTRAVENOUS

## 2020-05-01 MED ORDER — AMLODIPINE BESYLATE 5 MG PO TABS
5.0000 mg | ORAL_TABLET | Freq: Every day | ORAL | Status: DC
  Administered 2020-05-02 – 2020-05-04 (×3): 5 mg via ORAL
  Filled 2020-05-01 (×3): qty 1

## 2020-05-01 MED ORDER — SODIUM CHLORIDE 0.9 % IV SOLN
100.0000 mg | Freq: Every day | INTRAVENOUS | Status: DC
  Administered 2020-05-02 – 2020-05-04 (×3): 100 mg via INTRAVENOUS
  Filled 2020-05-01 (×4): qty 20

## 2020-05-01 MED ORDER — LISINOPRIL 20 MG PO TABS
20.0000 mg | ORAL_TABLET | Freq: Every day | ORAL | Status: DC
  Administered 2020-05-02 – 2020-05-04 (×3): 20 mg via ORAL
  Filled 2020-05-01 (×3): qty 1

## 2020-05-01 MED ORDER — SODIUM CHLORIDE 0.9 % IV SOLN
200.0000 mg | Freq: Once | INTRAVENOUS | Status: AC
  Administered 2020-05-01: 200 mg via INTRAVENOUS
  Filled 2020-05-01: qty 200
  Filled 2020-05-01: qty 40

## 2020-05-01 MED ORDER — DEXAMETHASONE SODIUM PHOSPHATE 10 MG/ML IJ SOLN
6.0000 mg | INTRAMUSCULAR | Status: DC
Start: 2020-05-01 — End: 2020-05-02
  Administered 2020-05-01: 6 mg via INTRAVENOUS
  Filled 2020-05-01 (×2): qty 1

## 2020-05-01 MED ORDER — HYDROCOD POLST-CPM POLST ER 10-8 MG/5ML PO SUER
5.0000 mL | Freq: Two times a day (BID) | ORAL | Status: DC | PRN
Start: 2020-05-01 — End: 2020-05-04
  Filled 2020-05-01 (×2): qty 5

## 2020-05-01 MED ORDER — GABAPENTIN 100 MG PO CAPS
100.0000 mg | ORAL_CAPSULE | Freq: Every day | ORAL | Status: DC
  Administered 2020-05-01 – 2020-05-03 (×3): 100 mg via ORAL
  Filled 2020-05-01 (×3): qty 1

## 2020-05-01 MED ORDER — GUAIFENESIN-DM 100-10 MG/5ML PO SYRP
10.0000 mL | ORAL_SOLUTION | ORAL | Status: DC | PRN
  Administered 2020-05-01: 10 mL via ORAL
  Filled 2020-05-01 (×2): qty 10

## 2020-05-01 MED ORDER — POLYVINYL ALCOHOL 1.4 % OP SOLN
1.0000 [drp] | Freq: Every day | OPHTHALMIC | Status: DC | PRN
  Filled 2020-05-01: qty 15

## 2020-05-01 MED ORDER — CLOPIDOGREL BISULFATE 75 MG PO TABS
75.0000 mg | ORAL_TABLET | Freq: Every day | ORAL | Status: DC
  Administered 2020-05-02 – 2020-05-04 (×3): 75 mg via ORAL
  Filled 2020-05-01 (×3): qty 1

## 2020-05-01 MED ORDER — ASPIRIN 81 MG PO CHEW
81.0000 mg | CHEWABLE_TABLET | Freq: Every day | ORAL | Status: DC
  Administered 2020-05-02 – 2020-05-04 (×3): 81 mg via ORAL
  Filled 2020-05-01 (×3): qty 1

## 2020-05-01 MED ORDER — SOTROVIMAB 500 MG/8ML IV SOLN: 500.0000 mg | Freq: Once | INTRAVENOUS | Status: DC

## 2020-05-01 NOTE — H&P (Signed)
History and Physical    [name] IEP:[id number] DOB: [birth year] DOA: 05/01/2020  PCP: [name], MD  Patient coming from: Home  Chief Complaint: hypoxia  HPI: [name] is a 80 y.o. female with medical history significant of pulmonary HTN, HTN, chronic hypoxic respiratory failure. Presenting with hypoxia. Her daughter states that she always checks the patient's O2 levels because she's on 3L chronic O2 at home. She noticed 2 days ago that the patient was having a little cough, and so she checked her pulse-ox. She found it to be low, so she took her to the ED. She was found to be COVID+, but her O2 saturations were ok. Her exam was ok. It was recommended that she go for MAB infusion. She reported today to the infusion center. During their evaluation, she was found to be severely hypoxic. She was directed to come to the ED. She denies any other aggravating or alleviating factors.    ED Course: Found to be hypoxic. Placed on increased O2 support. CXR was negative. TRH called for admission.   Review of Systems:  Denies CP, palpitations, syncopal episodes, fever, chills, N/V/D. Review of systems is otherwise negative for all not mentioned in HPI.   PMHx Past Medical History:  Diagnosis Date  . Allergy   . Arthritis   . Carotid artery occlusion   . GERD (gastroesophageal reflux disease)   . Hypertension   . Joint pain   . Thyroid disease     PSHx Past Surgical History:  Procedure Laterality Date  . ABDOMINAL HYSTERECTOMY    . AORTIC ARCH ANGIOGRAPHY N/A 03/01/2017   Procedure: AORTIC ARCH ANGIOGRAPHY;  Surgeon: Serafina [name], MD;  Location: [place] [facility] LAB;  Service: Cardiovascular;  Laterality: N/A;  . ARCH AORTOGRAM N/A 05/04/2011   Procedure: ARCH AORTOGRAM;  Surgeon: Serafina [name], MD;  Location: [facility] CATH LAB;  Service: Cardiovascular;  Laterality: N/A;  . ARCH AORTOGRAM N/A 07/04/2012   Procedure: ARCH AORTOGRAM;  Surgeon: Serafina [name], MD;  Location:  [facility] CATH LAB;  Service: Cardiovascular;  Laterality: N/A;  . CAROTID ANGIOGRAM  July 04, 2012  . CAROTID ANGIOGRAM N/A 05/04/2011   Procedure: CAROTID ANGIOGRAM;  Surgeon: Serafina [name], MD;  Location: [facility] CATH LAB;  Service: Cardiovascular;  Laterality: N/A;  . CAROTID ANGIOGRAM N/A 07/04/2012   Procedure: CAROTID ANGIOGRAM;  Surgeon: Serafina [name], MD;  Location: [facility] CATH LAB;  Service: Cardiovascular;  Laterality: N/A;  . CAROTID STENT INSERTION Right 05/11/2011   Procedure: CAROTID STENT INSERTION;  Surgeon: Serafina [name], MD;  Location: [facility] CATH LAB;  Service: Cardiovascular;  Laterality: Right;  . carotid subclavian anastomosis  05/11/11   Right subclav. artery transected 12/17/09  . CATARACT EXTRACTION     bilateral  . EYE SURGERY      SocHx  reports that she quit smoking about 40 years ago. Her smoking use included cigarettes. She has a 1.50 pack-year smoking history. She has never used smokeless tobacco. She reports that she does not drink alcohol and does not use drugs.  Allergies  Allergen Reactions  . Meclizine Nausea And Vomiting    FamHx Family History  Problem Relation Age of Onset  . Heart failure Mother   . Cancer Father   . Breast cancer Neg Hx     Prior to Admission medications   Medication Sig Start Date End Date Taking? Authorizing Provider  acetaminophen (TYLENOL) 325 MG tablet Take 650 mg every 6 (six) hours as  needed by mouth for mild pain or headache.    Yes [provider]  albuterol (VENTOLIN HFA) 108 (90 Base) MCG/ACT inhaler TAKE 2 PUFFS BY MOUTH EVERY 6 HOURS AS NEEDED FOR WHEEZE OR SHORTNESS OF BREATH Patient taking differently: Inhale 2 puffs into the lungs every 6 (six) hours as needed for wheezing or shortness of breath. 02/27/20  Yes [name], MD  amLODipine (NORVASC) 5 MG tablet TAKE 1 TABLET BY MOUTH EVERY DAY Patient taking differently: Take 5 mg by mouth daily. 04/21/20  Yes [name], MD  aspirin 81 MG chewable  tablet Chew 81 mg by mouth daily.   Yes [provider]  calcium-vitamin D (OSCAL WITH D) 500-200 MG-UNIT per tablet Take 1 tablet by mouth daily with breakfast.   Yes [provider]  cetirizine (ZYRTEC) 10 MG tablet Take 10 mg daily as needed by mouth for allergies.    Yes [provider]  clopidogrel (PLAVIX) 75 MG tablet TAKE 1 TABLET BY MOUTH EVERY DAY Patient taking differently: Take 75 mg by mouth daily. 03/27/20  Yes [name], MD  diazepam (VALIUM) 2 MG tablet Take 0.5 tablets (1 mg total) by mouth every 12 (twelve) hours as needed (vertigo). 01/15/20  Yes [name], MD  diclofenac Sodium (VOLTAREN) 1 % GEL Apply 2 g topically 4 (four) times daily. 11/25/19  Yes Darr, Edison Nasuti, PA-C  docusate sodium ([employer] STOOL SOFTENER) 100 MG capsule TAKE 1 CAPSULE (100 MG TOTAL) BY MOUTH DAILY AS NEEDED FOR MILD CONSTIPATION. Patient taking differently: Take 100 mg by mouth daily as needed for mild constipation. TAKE 1 CAPSULE (100 MG TOTAL) BY MOUTH DAILY AS NEEDED FOR MILD CONSTIPATION. 01/15/20  Yes [name], MD  fluticasone (FLONASE) 50 MCG/ACT nasal spray Place 1 spray into both nostrils daily as needed for allergies or rhinitis (for sinus congestion). 01/15/20  Yes [name], MD  gabapentin (NEURONTIN) 100 MG capsule TAKE 1 CAPSULE (100 MG TOTAL) BY MOUTH AT BEDTIME. 05/30/19  Yes [name], MD  latanoprost (XALATAN) 0.005 % ophthalmic solution Place 1 drop at bedtime into both eyes. 01/15/17  Yes [provider]  levothyroxine (SYNTHROID) 50 MCG tablet TAKE 1 TABLET BY MOUTH EVERY DAY Patient taking differently: Take 50 mcg by mouth daily before breakfast. 01/30/20  Yes [name], MD  lisinopril (ZESTRIL) 20 MG tablet TAKE 1 TABLET BY MOUTH EVERY DAY Patient taking differently: Take 20 mg by mouth daily. 02/06/20  Yes Freeman Caldron M, PA-C  metoprolol succinate (TOPROL-XL) 25 MG 24 hr tablet TAKE 1 TABLET BY MOUTH  EVERY DAY Patient taking differently: Take 25 mg by mouth daily. 09/24/19  Yes [name], MD  Multiple Vitamins-Minerals (ALIVE WOMENS ENERGY) TABS Take 1 tablet by mouth daily.   Yes [provider]  Probiotic Product (PROBIOTIC PO) Take 1 capsule by mouth daily.   Yes [provider]  simvastatin (ZOCOR) 20 MG tablet TAKE 1 TABLET BY MOUTH EVERYDAY AT BEDTIME Patient taking differently: Take 20 mg by mouth at bedtime. 11/23/19  Yes [name], MD  SYSTANE ULTRA 0.4-0.3 % SOLN Place 1 drop into both eyes daily as needed (dry eyes). 10/30/19  Yes [provider]    Physical Exam: Vitals:   05/01/20 1203 05/01/20 1235  BP: (!) 142/63 (!) 141/69  Pulse: 66 67  Resp: 18 (!) 22  Temp: 98.1 F (36.7 C)   TempSrc: Oral   SpO2: 96% 93%  General: 80 y.o. female resting in bed in NAD Eyes: PERRL, normal sclera ENMT: Nares patent w/o discharge, orophaynx clear, dentition normal, ears w/o discharge/lesions/ulcers Neck: Supple, trachea midline Cardiovascular: RRR, +S1, S2, no m/g/r, equal pulses throughout Respiratory: decreased at bases, soft wheeze on exam, normal WOB on 8L venturi GI: BS+, NDNT, no masses noted, no organomegaly noted MSK: No e/c/c Skin: No rashes, bruises, ulcerations noted Neuro: A&O x 3, no focal deficits Psyc: Appropriate interaction and affect, calm/cooperative  Labs on Admission: I have personally reviewed following labs and imaging studies  CBC: Recent Labs  Lab 05/01/20 1232  WBC 6.1  NEUTROABS 2.6  HGB 12.6  HCT 39.8  MCV 100.8*  PLT 102   Basic Metabolic Panel: Recent Labs  Lab 05/01/20 1232  NA 140  K 3.7  CL 106  CO2 23  GLUCOSE 103*  BUN 12  CREATININE 0.92  CALCIUM 9.4   GFR: CrCl cannot be calculated (Unknown ideal weight.). Liver Function Tests: Recent Labs  Lab 05/01/20 1232  AST 44*  ALT 35  ALKPHOS 74  BILITOT 0.4  PROT 8.6*  ALBUMIN 4.0   No results for input(s): LIPASE,  AMYLASE in the last 168 hours. No results for input(s): AMMONIA in the last 168 hours. Coagulation Profile: No results for input(s): INR, PROTIME in the last 168 hours. Cardiac Enzymes: No results for input(s): CKTOTAL, CKMB, CKMBINDEX, TROPONINI in the last 168 hours. BNP (last 3 results) No results for input(s): PROBNP in the last 8760 hours. HbA1C: No results for input(s): HGBA1C in the last 72 hours. CBG: No results for input(s): GLUCAP in the last 168 hours. Lipid Profile: Recent Labs    05/01/20 1232  TRIG 84   Thyroid Function Tests: No results for input(s): TSH, T4TOTAL, FREET4, T3FREE, THYROIDAB in the last 72 hours. Anemia Panel: Recent Labs    05/01/20 1232  FERRITIN 118   Urine analysis:    Component Value Date/Time   COLORURINE YELLOW 09/23/2015 0012   APPEARANCEUR Clear 01/15/2020 1225   LABSPEC 1.015 11/25/2019 1137   PHURINE 6.5 11/25/2019 1137   GLUCOSEU Negative 01/15/2020 1225   HGBUR NEGATIVE 11/25/2019 1137   BILIRUBINUR Negative 01/15/2020 1225   KETONESUR NEGATIVE 11/25/2019 1137   PROTEINUR Negative 01/15/2020 1225   PROTEINUR NEGATIVE 11/25/2019 1137   UROBILINOGEN 0.2 11/25/2019 1137   NITRITE Negative 01/15/2020 1225   NITRITE NEGATIVE 11/25/2019 1137   LEUKOCYTESUR Negative 01/15/2020 1225   LEUKOCYTESUR NEGATIVE 11/25/2019 1137    Radiological Exams on Admission: DG Chest Port 1 View  Result Date: 05/01/2020 CLINICAL DATA:  Shortness of breath. EXAM: PORTABLE CHEST 1 VIEW COMPARISON:  April 29, 2020. FINDINGS: Stable cardiomediastinal silhouette. No pneumothorax or pleural effusion is noted. Stable bibasilar densities are noted concerning for subsegmental atelectasis or scarring. Bony thorax is unremarkable. IMPRESSION: Stable bibasilar subsegmental atelectasis or scarring. Aortic Atherosclerosis (ICD10-I70.0). Electronically Signed   By: Marijo Conception M.D.   On: 05/01/2020 13:05   DG Chest Portable 1 View  Result Date:  04/29/2020 CLINICAL DATA:  COVID EXAM: PORTABLE CHEST 1 VIEW COMPARISON:  12/27/2019, 12/25/2019 FINDINGS: Mild diffuse reticular changes likely due to chronic interstitial changes. No acute superimposed focal airspace disease. No effusion. Stable cardiomediastinal silhouette with aortic atherosclerosis. No pneumothorax IMPRESSION: Chronic interstitial changes without acute superimposed airspace disease. Electronically Signed   By: Donavan Foil M.D.   On: 04/29/2020 22:18    EKG: Independently reviewed. 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  Jonnie Finner DO [facility]  If 7PM-7AM, please contact night-coverage [email or web address]  05/01/2020, 2:25 PM

## 2020-05-01 NOTE — ED Notes (Signed)
Pt speaking to RN, denies SOB. Able to carry conversation without difficulty. Pt currently 94% on 8L venti mask.

## 2020-05-01 NOTE — Progress Notes (Signed)
Patient arrived in infusion clinic for monoclonal antibody infusion.  Patient was wearing home oxygen at 3L [place].  Pt oxygen saturation was 78% on 3L.  Oxygen was increased to 12L 50% on venti mask. ED was notified and patient will be transferred once a room becomes available.  Patient oxygen increased to 94%.  Family was notified that patient will be evaluated in the ED.  Daughter's phone number is [phone].  Will continue to monitor.

## 2020-05-01 NOTE — Progress Notes (Signed)
At this time room 1528 does not have a callbell or telephone in the room.  Staff is trying to locate these items for the room but at this time patient will not be able to call for help if needed.  MD aware that patient is on this unit  - waiting for orders at this time

## 2020-05-01 NOTE — ED Notes (Signed)
ED Provider at bedside.

## 2020-05-01 NOTE — Progress Notes (Incomplete)
Had to call [facility] to make sure we has staff for tonight.

## 2020-05-01 NOTE — ED Provider Notes (Signed)
[facility] DEPT Provider Note   CSN: [id number] Arrival date & time: 05/01/20  1147     History Chief Complaint  Patient presents with  . Hypoxia  . Covid Positive    [name] is a 80 y.o. female.  Patient is a 80 year old female with a history of chronic lung disease on home oxygen at 3 L/min, pulmonary hypertension, hypertension and hyperlipidemia.  She presents with hypoxia.  She has had a cough for the last 3 days.  2 days ago she was tested for COVID and it was positive.  She went to the infusion clinic today and while there was noted to have an oxygen saturation of 78% on her baseline 3 L/min of oxygen.  She was sent here for further evaluation.  She denies any significant shortness of breath.  She does have some minor coughing.  No vomiting or diarrhea.  No abdominal pain.  She has a little bit of rhinorrhea.  No known fevers.  She is not vaccinated against COVID.  She was placed on a Ventimask at 8 L which she is currently on and is oxygenating at 93 to 94% on this.        Past Medical History:  Diagnosis Date  . Allergy   . Arthritis   . Carotid artery occlusion   . GERD (gastroesophageal reflux disease)   . Hypertension   . Joint pain   . Thyroid disease     Patient Active Problem List   Diagnosis Date Noted  . COVID-19 05/01/2020  . Pulmonary hypertension ([place]) 01/15/2020  . Hypoxemic respiratory failure, chronic ([place]) 01/15/2020  . Lung nodule, multiple 01/15/2020  . 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 SOB (shortness of breath) 12/26/2019  . Acute on chronic respiratory failure with hypoxia ([place]) 12/25/2019  . Acute respiratory failure with hypoxemia ([place]) 12/25/2019  . Essential hypertension 06/06/2017  . Hyperlipidemia 06/06/2017  . Leg cramps 06/06/2017  . Vertigo 06/06/2017  . Macular degeneration 06/06/2017  . Hypothyroidism 06/06/2017  . Carotid stenosis 01/22/2014  . Aftercare following surgery  of the circulatory system, [place] 01/02/2013  . Occlusion and stenosis of carotid artery without mention of cerebral infarction 04/27/2011    Past Surgical History:  Procedure Laterality Date  . ABDOMINAL HYSTERECTOMY    . AORTIC ARCH ANGIOGRAPHY N/A 03/01/2017   Procedure: AORTIC ARCH ANGIOGRAPHY;  Surgeon: Serafina [name], MD;  Location: [facility];  Service: Cardiovascular;  Laterality: N/A;  . ARCH AORTOGRAM N/A 05/04/2011   Procedure: ARCH AORTOGRAM;  Surgeon: Serafina [name], MD;  Location: [facility] CATH LAB;  Service: Cardiovascular;  Laterality: N/A;  . ARCH AORTOGRAM N/A 07/04/2012   Procedure: ARCH AORTOGRAM;  Surgeon: Serafina [name], MD;  Location: [facility] CATH LAB;  Service: Cardiovascular;  Laterality: N/A;  . CAROTID ANGIOGRAM  July 04, 2012  . CAROTID ANGIOGRAM N/A 05/04/2011   Procedure: CAROTID ANGIOGRAM;  Surgeon: Serafina [name], MD;  Location: [facility] CATH LAB;  Service: Cardiovascular;  Laterality: N/A;  . CAROTID ANGIOGRAM N/A 07/04/2012   Procedure: CAROTID ANGIOGRAM;  Surgeon: Serafina [name], MD;  Location: [facility] CATH LAB;  Service: Cardiovascular;  Laterality: N/A;  . CAROTID STENT INSERTION Right 05/11/2011   Procedure: CAROTID STENT INSERTION;  Surgeon: Serafina [name], MD;  Location: [facility] CATH LAB;  Service: Cardiovascular;  Laterality: Right;  . carotid subclavian anastomosis  05/11/11   Right subclav. artery transected 12/17/09  . CATARACT EXTRACTION     bilateral  .  EYE SURGERY       OB History   No obstetric history on file.     Family History  Problem Relation Age of Onset  . Heart failure Mother   . Cancer Father   . Breast cancer Neg Hx     Social History   Tobacco Use  . Smoking status: Former Smoker    Packs/day: 0.30    Years: 5.00    Pack years: 1.50    Types: Cigarettes    Quit date: 04/12/1980    Years since quitting: 40.0  . Smokeless tobacco: Never Used  Vaping Use  . Vaping Use: Never used  Substance Use Topics  . Alcohol use: No  . Drug  use: No    Home Medications Prior to Admission medications   Medication Sig Start Date End Date Taking? Authorizing Provider  acetaminophen (TYLENOL) 325 MG tablet Take 650 mg every 6 (six) hours as needed by mouth for mild pain or headache.     [provider]  albuterol (VENTOLIN HFA) 108 (90 Base) MCG/ACT inhaler TAKE 2 PUFFS BY MOUTH EVERY 6 HOURS AS NEEDED FOR WHEEZE OR SHORTNESS OF BREATH 02/27/20   [name], MD  amLODipine (NORVASC) 5 MG tablet TAKE 1 TABLET BY MOUTH EVERY DAY 04/21/20   [name], MD  aspirin 81 MG chewable tablet Chew 81 mg by mouth daily.    [provider]  calcium-vitamin D (OSCAL WITH D) 500-200 MG-UNIT per tablet Take 1 tablet by mouth daily with breakfast.    [provider]  cetirizine (ZYRTEC) 10 MG tablet Take 10 mg daily as needed by mouth for allergies.     [provider]  clopidogrel (PLAVIX) 75 MG tablet TAKE 1 TABLET BY MOUTH EVERY DAY 03/27/20   [name], MD  diazepam (VALIUM) 2 MG tablet Take 0.5 tablets (1 mg total) by mouth every 12 (twelve) hours as needed (vertigo). 01/15/20   [name], MD  diclofenac Sodium (VOLTAREN) 1 % GEL Apply 2 g topically 4 (four) times daily. 11/25/19   Darr, Edison Nasuti, PA-C  docusate sodium ([employer] STOOL SOFTENER) 100 MG capsule TAKE 1 CAPSULE (100 MG TOTAL) BY MOUTH DAILY AS NEEDED FOR MILD CONSTIPATION. 01/15/20   [name], MD  fluticasone (FLONASE) 50 MCG/ACT nasal spray Place 1 spray into both nostrils daily as needed for allergies or rhinitis (for sinus congestion). 01/15/20   [name], MD  gabapentin (NEURONTIN) 100 MG capsule TAKE 1 CAPSULE (100 MG TOTAL) BY MOUTH AT BEDTIME. 05/30/19   [name], MD  latanoprost (XALATAN) 0.005 % ophthalmic solution Place 1 drop at bedtime into both eyes. 01/15/17   [provider]  levothyroxine (SYNTHROID) 50 MCG tablet TAKE 1 TABLET BY MOUTH EVERY DAY 01/30/20   [name],  MD  lisinopril (ZESTRIL) 20 MG tablet TAKE 1 TABLET BY MOUTH EVERY DAY 02/06/20   Freeman Caldron M, PA-C  metoprolol succinate (TOPROL-XL) 25 MG 24 hr tablet TAKE 1 TABLET BY MOUTH EVERY DAY Patient taking differently: Take 25 mg by mouth daily.  09/24/19   [name], MD  Multiple Vitamins-Minerals (ALIVE WOMENS ENERGY) TABS Take 1 tablet by mouth daily.    [provider]  Probiotic Product (PROBIOTIC PO) Take 1 capsule by mouth daily.    [provider]  simvastatin (ZOCOR) 20 MG tablet TAKE 1 TABLET BY MOUTH EVERYDAY AT BEDTIME Patient taking differently: Take 20 mg by mouth at bedtime.  11/23/19   [name], MD  SYSTANE ULTRA 0.4-0.3 % SOLN Place 1 drop into both eyes as directed. 10/30/19   [provider]    Allergies    Meclizine  Review of Systems   Review of Systems  Constitutional: Positive for fatigue. Negative for chills, diaphoresis and fever.  HENT: Negative for congestion, rhinorrhea and sneezing.   Eyes: Negative.   Respiratory: Positive for cough. Negative for chest tightness and shortness of breath.   Cardiovascular: Negative for chest pain and leg swelling.  Gastrointestinal: Negative for abdominal pain, blood in stool, diarrhea, nausea and vomiting.  Genitourinary: Negative for difficulty urinating, flank pain, frequency and hematuria.  Musculoskeletal: Negative for arthralgias and back pain.  Skin: Negative for rash.  Neurological: Negative for dizziness, speech difficulty, weakness, numbness and headaches.    Physical Exam Updated Vital Signs BP (!) 141/69   Pulse 67   Temp 98.1 F (36.7 C) (Oral)   Resp (!) 22   SpO2 93%   Physical Exam Constitutional:      Appearance: She is well-developed and well-nourished.  HENT:     Head: Normocephalic and atraumatic.  Eyes:     Pupils: Pupils are equal, round, and reactive to light.  Cardiovascular:     Rate and Rhythm: Normal rate and regular rhythm.     Heart  sounds: Normal heart sounds.  Pulmonary:     Effort: Pulmonary effort is normal. No respiratory distress.     Breath sounds: Normal breath sounds. No wheezing or rales.     Comments: No increased work of breathing Chest:     Chest wall: No tenderness.  Abdominal:     General: Bowel sounds are normal.     Palpations: Abdomen is soft.     Tenderness: There is no abdominal tenderness. There is no guarding or rebound.  Musculoskeletal:        General: No swelling, tenderness or edema. Normal range of motion.     Cervical back: Normal range of motion and neck supple.  Lymphadenopathy:     Cervical: No cervical adenopathy.  Skin:    General: Skin is warm and dry.     Findings: No rash.  Neurological:     Mental Status: She is alert and oriented to person, place, and time.  Psychiatric:        Mood and Affect: Mood and affect normal.     ED Results / Procedures / Treatments   Labs (all labs ordered are listed, but only abnormal results are displayed) Labs Reviewed  CBC WITH DIFFERENTIAL/PLATELET - Abnormal; Notable for the following components:      Result Value   MCV 100.8 (*)    All other components within normal limits  COMPREHENSIVE METABOLIC PANEL - Abnormal; Notable for the following components:   Glucose, Bld 103 (*)    Total Protein 8.6 (*)    AST 44 (*)    All other components within normal limits  D-DIMER, QUANTITATIVE (NOT AT [facility]) - Abnormal; Notable for the following components:   D-Dimer, Quant 1.10 (*)    All other components within normal limits  LACTATE DEHYDROGENASE - Abnormal; Notable for the following components:   LDH 218 (*)    All other components within normal limits  FIBRINOGEN - Abnormal; Notable for the following components:   Fibrinogen 478 (*)    All other components within normal limits  CULTURE, BLOOD (ROUTINE X 2)  CULTURE, BLOOD (ROUTINE X 2)  LACTIC ACID, PLASMA  TRIGLYCERIDES  LACTIC ACID, PLASMA  PROCALCITONIN  FERRITIN  C-REACTIVE  PROTEIN    EKG EKG Interpretation  Date/Time:  Thursday May 01 2020 12:07:21 EST Ventricular Rate:  63 PR Interval:    QRS Duration: 85 QT Interval:  436 QTC Calculation: 447 R Axis:   45 Text Interpretation: Age not entered, assumed to be  80 years old for purpose of ECG interpretation Sinus rhythm Minimal ST depression, inferior leads Confirmed by Malvin Johns ([id number]) on 05/01/2020 12:24:02 PM   Radiology DG Chest Port 1 View  Result Date: 05/01/2020 CLINICAL DATA:  Shortness of breath. EXAM: PORTABLE CHEST 1 VIEW COMPARISON:  April 29, 2020. FINDINGS: Stable cardiomediastinal silhouette. No pneumothorax or pleural effusion is noted. Stable bibasilar densities are noted concerning for subsegmental atelectasis or scarring. Bony thorax is unremarkable. IMPRESSION: Stable bibasilar subsegmental atelectasis or scarring. Aortic Atherosclerosis (ICD10-I70.0). Electronically Signed   By: Marijo Conception M.D.   On: 05/01/2020 13:05   DG Chest Portable 1 View  Result Date: 04/29/2020 CLINICAL DATA:  COVID EXAM: PORTABLE CHEST 1 VIEW COMPARISON:  12/27/2019, 12/25/2019 FINDINGS: Mild diffuse reticular changes likely due to chronic interstitial changes. No acute superimposed focal airspace disease. No effusion. Stable cardiomediastinal silhouette with aortic atherosclerosis. No pneumothorax IMPRESSION: Chronic interstitial changes without acute superimposed airspace disease. Electronically Signed   By: Donavan Foil M.D.   On: 04/29/2020 22:18    Procedures Procedures (including critical care time)  Medications Ordered in ED Medications - No data to display  ED Course  I have reviewed the triage vital signs and the nursing notes.  Pertinent labs & imaging results that were available during my care of the patient were reviewed by me and considered in my medical decision making (see chart for details).    MDM Rules/Calculators/A&P                          Patient is a 80 year old  female who presents with hypoxia related to recent COVID infection.  She has no increased work of breathing.  She over all looks okay.  However she has markedly hypoxic.  She is doing okay on a Ventimask at 8 to 9 L/min.  However she quickly desats if taken off of this.  Her labs are nonconcerning.  Her chest x-ray shows findings consistent with her COVID infection.  I spoke with Dr. Marylyn Ishihara with the hospitalist service he will admit the patient for further treatment.  CRITICAL CARE Performed by: Malvin Johns Total critical care time: 45 minutes Critical care time was exclusive of separately billable procedures and treating other patients. Critical care was necessary to treat or prevent imminent or life-threatening deterioration. Critical care was time spent personally by me on the following activities: development of treatment plan with patient and/or surrogate as well as nursing, discussions with consultants, evaluation of patient's response to treatment, examination of patient, obtaining history from patient or surrogate, ordering and performing treatments and interventions, ordering and review of laboratory studies, ordering and review of radiographic studies, pulse oximetry and re-evaluation of patient's condition.  Final Clinical Impression(s) / ED Diagnoses Final diagnoses:  [id number] virus infection  Hypoxia    Rx / DC Orders ED Discharge Orders    None       Malvin Johns, MD 05/01/20 1338

## 2020-05-01 NOTE — ED Notes (Signed)
Jacinto Halim, daughter would like an update, [phone].

## 2020-05-01 NOTE — ED Triage Notes (Signed)
Pt came from [facility] infusion center by Vickki Muff, Therapist, sports. Per infusion RN pt was 78% on 3L [place]. Pt is on 3L [place] at baseline. Pt not visibly in respiratory distress. Pt denies SOB. Per Vickki Muff, RN pt O2 reading 93% on 8L 40% venti. Pt daughter aware pt is in ED. Pt has hx of lung disease.

## 2020-05-01 NOTE — Progress Notes (Signed)
Patient arrives to room 1528 at this time via stretcher from the ED

## 2020-05-01 NOTE — Progress Notes (Signed)
callbell and telephone provided to patient at this time.  Use of each explained with stated understanding

## 2020-05-02 DIAGNOSIS — R0902 Hypoxemia: Secondary | ICD-10-CM

## 2020-05-02 DIAGNOSIS — U071 COVID-19: Secondary | ICD-10-CM | POA: Diagnosis not present

## 2020-05-02 LAB — CBC WITH DIFFERENTIAL/PLATELET
Abs Immature Granulocytes: 0.01 10*3/uL (ref 0.00–0.07)
Basophils Absolute: 0 10*3/uL (ref 0.0–0.1)
Basophils Relative: 0 %
Eosinophils Absolute: 0 10*3/uL (ref 0.0–0.5)
Eosinophils Relative: 0 %
HCT: 41.1 % (ref 36.0–46.0)
Hemoglobin: 12.7 g/dL (ref 12.0–15.0)
Immature Granulocytes: 0 %
Lymphocytes Relative: 40 %
Lymphs Abs: 1.7 10*3/uL (ref 0.7–4.0)
MCH: 31.7 pg (ref 26.0–34.0)
MCHC: 30.9 g/dL (ref 30.0–36.0)
MCV: 102.5 fL — ABNORMAL HIGH (ref 80.0–100.0)
Monocytes Absolute: 0.1 10*3/uL (ref 0.1–1.0)
Monocytes Relative: 2 %
Neutro Abs: 2.5 10*3/uL (ref 1.7–7.7)
Neutrophils Relative %: 58 %
Platelets: 186 10*3/uL (ref 150–400)
RBC: 4.01 MIL/uL (ref 3.87–5.11)
RDW: 14.2 % (ref 11.5–15.5)
WBC: 4.3 10*3/uL (ref 4.0–10.5)
nRBC: 0 % (ref 0.0–0.2)

## 2020-05-02 LAB — COMPREHENSIVE METABOLIC PANEL
ALT: 26 U/L (ref 0–44)
AST: 31 U/L (ref 15–41)
Albumin: 3.7 g/dL (ref 3.5–5.0)
Alkaline Phosphatase: 63 U/L (ref 38–126)
Anion gap: 10 (ref 5–15)
BUN: 10 mg/dL (ref 8–23)
CO2: 19 mmol/L — ABNORMAL LOW (ref 22–32)
Calcium: 9.2 mg/dL (ref 8.9–10.3)
Chloride: 109 mmol/L (ref 98–111)
Creatinine, Ser: 0.83 mg/dL (ref 0.44–1.00)
GFR, Estimated: >60 mL/min (ref >60)
Glucose, Bld: 128 mg/dL — ABNORMAL HIGH (ref 70–99)
Potassium: 4.4 mmol/L (ref 3.5–5.1)
Sodium: 138 mmol/L (ref 135–145)
Total Bilirubin: 0.4 mg/dL (ref 0.3–1.2)
Total Protein: 7.8 g/dL (ref 6.5–8.1)

## 2020-05-02 LAB — FERRITIN: Ferritin: 124 ng/mL (ref 11–307)

## 2020-05-02 LAB — C-REACTIVE PROTEIN: CRP: <0.5 mg/dL (ref <1.0)

## 2020-05-02 LAB — MAGNESIUM: Magnesium: 2.2 mg/dL (ref 1.7–2.4)

## 2020-05-02 LAB — D-DIMER, QUANTITATIVE: D-Dimer, Quant: 0.88 ug/mL-FEU — ABNORMAL HIGH (ref 0.00–0.50)

## 2020-05-02 MED ORDER — FUROSEMIDE 10 MG/ML IJ SOLN
40.0000 mg | Freq: Once | INTRAMUSCULAR | Status: AC
  Administered 2020-05-02: 40 mg via INTRAVENOUS
  Filled 2020-05-02: qty 4

## 2020-05-02 MED ORDER — METHYLPREDNISOLONE SODIUM SUCC 40 MG IJ SOLR
30.0000 mg | Freq: Two times a day (BID) | INTRAMUSCULAR | Status: DC
  Administered 2020-05-02 – 2020-05-03 (×4): 30 mg via INTRAVENOUS
  Filled 2020-05-02 (×4): qty 1

## 2020-05-02 NOTE — Progress Notes (Signed)
PROGRESS NOTE  [name] [id number]:[id number] DOB: 1940-08-11 DOA: 05/01/2020 PCP: [name], MD   LOS: 1 day   Brief Narrative / Interim history: 80 year old female with pulmonary HTN, essential HTN, chronic hypoxic respiratory failure with 3 L of oxygen at home with worsening hypoxia.  Daughter noticed that patient was having a cough/congestion, checked her pulse ox and found to be low so she took her to the ED.  Couple days ago was found to be COVID-positive but her oxygen saturations were okay it was recommended that she goes for infusion.  At the infusion center she was found to be severely hypoxic and was directed to the ED.  Her sats were apparently 78% on 3 L in ED to ED she was placed on 7 L.  She underwent a CT angiogram which was negative for PE but also did not show much in terms of viral pneumonia findings.  BNP was normal at 80  Subjective / 24h Interval events: Doing well this morning, she has no complaints.  Does not know why he is here as she is feeling at her normal self  Assessment & Plan: Principal Problem Acute on chronic hypoxic respiratory failure, underlying pulmonary hypertension -Imaging not necessarily very conclusive for viral pneumonia however clinically it appears that she has been having URI type symptoms and she is now more hypoxic. -Continue steroids, Remdesivir, wean off oxygen as tolerated.  On 5 L this morning.  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 amLODipine  5 mg Oral Daily  . vitamin C  500 mg Oral Daily  . aspirin  81 mg Oral Daily  . clopidogrel  75 mg Oral Daily  . dexamethasone (DECADRON)  injection  6 mg Intravenous Q24H  . enoxaparin (LOVENOX) injection  40 mg Subcutaneous Q24H  . gabapentin  100 mg Oral QHS  . latanoprost  1 drop Both Eyes QHS  . levothyroxine  50 mcg Oral QAC breakfast  . lisinopril  20 mg Oral Daily  . mouth rinse  15 mL Mouth Rinse BID  . metoprolol succinate  25 mg Oral Daily  . simvastatin  20 mg Oral Daily  . zinc sulfate  220 mg Oral Daily   Continuous Infusions: . remdesivir 100 mg in NS 100 mL 100 mg (05/02/20 1020)   PRN Meds:.acetaminophen, albuterol, chlorpheniramine-HYDROcodone, diazepam, fluticasone, guaiFENesin-dextromethorphan, polyvinyl alcohol  Diet Orders (From admission, onward)    Start     Ordered   05/01/20 1711  Diet Heart Room service appropriate? Yes; Fluid consistency: Thin  Diet effective now       Question Answer Comment  Room service appropriate? Yes   Fluid consistency: Thin      05/01/20 1711          DVT prophylaxis: enoxaparin (LOVENOX) injection 40 mg Start: 05/01/20 1800     Code Status: Full Code  Family Communication: daughter over the phone  Status is: Inpatient  Remains inpatient appropriate because:Inpatient level of care appropriate due to severity of illness   Dispo: The patient is from: Home              Anticipated d/c is to: Home              Anticipated  d/c date is: 3 days              Patient currently is not medically stable to d/c.   Consultants:  None   Procedures:  None   Microbiology  None   Antimicrobials: None     Objective: Vitals:   05/02/20 0208 05/02/20 0521 05/02/20 0956 05/02/20 1018  BP: (!) 113/59 113/61 (!) 116/56 (!) 116/56  Pulse: 63 65  74  Resp: 20 17    Temp: 97.7 F (36.5 C) 97.6 F (36.4 C)    TempSrc:  Oral    SpO2: 96% 97%      Intake/Output Summary (Last 24 hours) at 05/02/2020 1110 Last data filed at 05/02/2020 0411 Gross per 24 hour  Intake 240 ml  Output 0 ml  Net 240 ml   There were no vitals filed for this  visit.  Examination:  Constitutional: NAD Eyes: no scleral icterus ENMT: Mucous membranes are moist.  Neck: normal, supple Respiratory: Distant breath sounds but overall clear to auscultation bilaterally, no wheezing, no crackles.  Cardiovascular: Regular rate and rhythm, no murmurs / rubs / gallops. No LE edema.  Abdomen: non distended, no tenderness. Bowel sounds positive.  Musculoskeletal: no clubbing / cyanosis.  Skin: no rashes Neurologic: non focal    Data Reviewed: I have independently reviewed following labs and imaging studies   CBC: Recent Labs  Lab 05/01/20 1232 05/02/20 0407  WBC 6.1 4.3  NEUTROABS 2.6 2.5  HGB 12.6 12.7  HCT 39.8 41.1  MCV 100.8* 102.5*  PLT 214 202   Basic Metabolic Panel: Recent Labs  Lab 05/01/20 1232 05/02/20 0407  NA 140 138  K 3.7 4.4  CL 106 109  CO2 23 19*  GLUCOSE 103* 128*  BUN 12 10  CREATININE 0.92 0.83  CALCIUM 9.4 9.2  MG  --  2.2   Liver Function Tests: Recent Labs  Lab 05/01/20 1232 05/02/20 0407  AST 44* 31  ALT 35 26  ALKPHOS 74 63  BILITOT 0.4 0.4  PROT 8.6* 7.8  ALBUMIN 4.0 3.7   Coagulation Profile: No results for input(s): INR, PROTIME in the last 168 hours. HbA1C: No results for input(s): HGBA1C in the last 72 hours. CBG: No results for input(s): GLUCAP in the last 168 hours.  Recent Results (from the past 240 hour(s))  Resp Panel by RT-PCR (Flu A&B, Covid) Nasopharyngeal Swab     Status: Abnormal   Collection Time: 04/29/20  6:24 PM   Specimen: Nasopharyngeal Swab; Nasopharyngeal(NP) swabs in vial transport medium  Result Value Ref Range Status   SARS Coronavirus 2 by RT PCR POSITIVE (A) NEGATIVE Final    Comment: RESULT CALLED TO, READ BACK BY AND VERIFIED WITH: Assunta Found RN 1956 04/29/20 A BROWNING (NOTE) SARS-CoV-2 target nucleic acids are DETECTED.  The SARS-CoV-2 RNA is generally detectable in upper respiratory specimens during the acute phase of infection. Positive results  are indicative of the presence of the identified virus, but do not rule out bacterial infection or co-infection with other pathogens not detected by the test. Clinical correlation with patient history and other diagnostic information is necessary to determine patient infection status. The expected result is Negative.  Fact Sheet for Patients: EntrepreneurPulse.com.au  Fact Sheet for Healthcare Providers: IncredibleEmployment.be  This test is not yet approved or cleared by the [place] FDA and  has been authorized for detection and/or diagnosis of SARS-CoV-2 by FDA under an Emergency Use Authorization (EUA).  This EUA will remain in  effect (meaning this test can  be used) for the duration of  the COVID-19 declaration under Section 564(b)(1) of the Act, 21 U.S.C. section 360bbb-3(b)(1), unless the authorization is terminated or revoked sooner.     Influenza A by PCR NEGATIVE NEGATIVE Final   Influenza B by PCR NEGATIVE NEGATIVE Final    Comment: (NOTE) The Xpert Xpress SARS-CoV-2/FLU/RSV plus assay is intended as an aid in the diagnosis of influenza from Nasopharyngeal swab specimens and should not be used as a sole basis for treatment. Nasal washings and aspirates are unacceptable for Xpert Xpress SARS-CoV-2/FLU/RSV testing.  Fact Sheet for Patients: EntrepreneurPulse.com.au  Fact Sheet for Healthcare Providers: IncredibleEmployment.be  This test is not yet approved or cleared by the [place] FDA and has been authorized for detection and/or diagnosis of SARS-CoV-2 by FDA under an Emergency Use Authorization (EUA). This EUA will remain in effect (meaning this test can be used) for the duration of the COVID-19 declaration under Section 564(b)(1) of the Act, 21 U.S.C. section 360bbb-3(b)(1), unless the authorization is terminated or revoked.  Performed at [facility], [place] [address]  [place], [place] [address]      Radiology Studies: CT ANGIO CHEST PE W OR WO CONTRAST  Result Date: 05/01/2020 CLINICAL DATA:  Shortness of breath and COVID-19 positivity EXAM: CT ANGIOGRAPHY CHEST WITH CONTRAST TECHNIQUE: Multidetector CT imaging of the chest was performed using the standard protocol during bolus administration of intravenous contrast. Multiplanar CT image reconstructions and MIPs were obtained to evaluate the vascular anatomy. CONTRAST:  80m OMNIPAQUE IOHEXOL 350 MG/ML SOLN COMPARISON:  Chest x-ray from earlier in the same day, 12/25/2019 CT. FINDINGS: Cardiovascular: Atherosclerotic calcifications of the thoracic aorta are noted without aneurysmal dilatation or dissection. No cardiac enlargement is seen. Coronary calcifications are noted. The pulmonary artery shows a normal branching pattern bilaterally. No focal filling defect is identified to suggest pulmonary embolism. Right innominate artery stenting is seen. This is stable from the prior exam. Mediastinum/Nodes: Stable mediastinal and hilar nodes are identified unchanged from September of 2021. Again these are likely reactive in nature given their stability. Esophagus is within normal limits. No new adenopathy is seen. Lungs/Pleura: The lungs demonstrate diffuse emphysematous changes. Stable nodular changes are noted in the right upper lobe best seen on image number 38 and 34 of series 6. The overall appearance is stable from the prior study. The larger of these nodules again measures 7 mm. No focal infiltrate is seen. No sequelae to correspond with the given clinical history of COVID-19 positivity are seen. No pleural effusion is noted. Upper Abdomen: Visualized upper abdomen shows no acute abnormality. Calcified lymph node is noted in the porta hepatis stable from a prior exam of 2017. Musculoskeletal: Degenerative changes of the thoracic spine are noted. Old rib fractures are seen. No acute bony abnormality is noted. Review of the MIP  images confirms the above findings. IMPRESSION: No evidence of pulmonary emboli. Stable hilar and mediastinal lymph nodes again likely reactive in nature. Stable nodular densities in the right upper lobe the largest of which measures 7 mm. Given their stability, then future CT at 14-20 months (from today's scan) is considered optional for low-risk patients, but is recommended for high-risk patients. This recommendation follows the consensus statement: Guidelines for Management of Incidental Pulmonary Nodules Detected on CT Images: From the Fleischner Society 2017; Radiology 2017; 284:228-243. Aortic Atherosclerosis (ICD10-I70.0) and Emphysema (ICD10-J43.9). Electronically Signed   By: MInez CatalinaM.D.   On: 05/01/2020 22:59  DG Chest Port 1 View  Result Date: 05/01/2020 CLINICAL DATA:  Shortness of breath. EXAM: PORTABLE CHEST 1 VIEW COMPARISON:  April 29, 2020. FINDINGS: Stable cardiomediastinal silhouette. No pneumothorax or pleural effusion is noted. Stable bibasilar densities are noted concerning for subsegmental atelectasis or scarring. Bony thorax is unremarkable. IMPRESSION: Stable bibasilar subsegmental atelectasis or scarring. Aortic Atherosclerosis (ICD10-I70.0). Electronically Signed   By: Marijo Conception M.D.   On: 05/01/2020 13:05    Marzetta Board, MD, PhD [facility]  Between 7 am - 7 pm I am available, please contact me via Amion or Securechat  Between 7 pm - 7 am I am not available, please contact night coverage MD/APP via Amion

## 2020-05-02 NOTE — Plan of Care (Signed)

## 2020-05-03 DIAGNOSIS — U071 COVID-19: Secondary | ICD-10-CM | POA: Diagnosis not present

## 2020-05-03 DIAGNOSIS — R0902 Hypoxemia: Secondary | ICD-10-CM | POA: Diagnosis not present

## 2020-05-03 LAB — CBC WITH DIFFERENTIAL/PLATELET
Abs Immature Granulocytes: 0.02 10*3/uL (ref 0.00–0.07)
Basophils Absolute: 0 10*3/uL (ref 0.0–0.1)
Basophils Relative: 0 %
Eosinophils Absolute: 0 10*3/uL (ref 0.0–0.5)
Eosinophils Relative: 0 %
HCT: 40.5 % (ref 36.0–46.0)
Hemoglobin: 12.7 g/dL (ref 12.0–15.0)
Immature Granulocytes: 0 %
Lymphocytes Relative: 32 %
Lymphs Abs: 2 10*3/uL (ref 0.7–4.0)
MCH: 31.3 pg (ref 26.0–34.0)
MCHC: 31.4 g/dL (ref 30.0–36.0)
MCV: 99.8 fL (ref 80.0–100.0)
Monocytes Absolute: 0.3 10*3/uL (ref 0.1–1.0)
Monocytes Relative: 4 %
Neutro Abs: 4 10*3/uL (ref 1.7–7.7)
Neutrophils Relative %: 64 %
Platelets: 212 10*3/uL (ref 150–400)
RBC: 4.06 MIL/uL (ref 3.87–5.11)
RDW: 14 % (ref 11.5–15.5)
WBC: 6.4 10*3/uL (ref 4.0–10.5)
nRBC: 0 % (ref 0.0–0.2)

## 2020-05-03 LAB — D-DIMER, QUANTITATIVE: D-Dimer, Quant: 0.82 ug/mL-FEU — ABNORMAL HIGH (ref 0.00–0.50)

## 2020-05-03 LAB — COMPREHENSIVE METABOLIC PANEL
ALT: 24 U/L (ref 0–44)
AST: 27 U/L (ref 15–41)
Albumin: 4 g/dL (ref 3.5–5.0)
Alkaline Phosphatase: 65 U/L (ref 38–126)
Anion gap: 9 (ref 5–15)
BUN: 25 mg/dL — ABNORMAL HIGH (ref 8–23)
CO2: 23 mmol/L (ref 22–32)
Calcium: 9.6 mg/dL (ref 8.9–10.3)
Chloride: 106 mmol/L (ref 98–111)
Creatinine, Ser: 0.94 mg/dL (ref 0.44–1.00)
GFR, Estimated: >60 mL/min (ref >60)
Glucose, Bld: 121 mg/dL — ABNORMAL HIGH (ref 70–99)
Potassium: 4.3 mmol/L (ref 3.5–5.1)
Sodium: 138 mmol/L (ref 135–145)
Total Bilirubin: 0.4 mg/dL (ref 0.3–1.2)
Total Protein: 8.4 g/dL — ABNORMAL HIGH (ref 6.5–8.1)

## 2020-05-03 LAB — MAGNESIUM: Magnesium: 2 mg/dL (ref 1.7–2.4)

## 2020-05-03 LAB — C-REACTIVE PROTEIN: CRP: 0.5 mg/dL (ref <1.0)

## 2020-05-03 LAB — FERRITIN: Ferritin: 141 ng/mL (ref 11–307)

## 2020-05-03 MED ORDER — FUROSEMIDE 10 MG/ML IJ SOLN
40.0000 mg | Freq: Once | INTRAMUSCULAR | Status: AC
  Administered 2020-05-03: 40 mg via INTRAVENOUS
  Filled 2020-05-03: qty 4

## 2020-05-03 NOTE — Progress Notes (Signed)
PROGRESS NOTE  [name] [id number]:[id number] DOB: May 24, 1940 DOA: 05/01/2020 PCP: [name], MD   LOS: 2 days   Brief Narrative / Interim history: 80 year old female with pulmonary HTN, essential HTN, chronic hypoxic respiratory failure with 3 L of oxygen at home with worsening hypoxia.  Daughter noticed that patient was having a cough/congestion, checked her pulse ox and found to be low so she took her to the ED.  Couple days ago was found to be COVID-positive but her oxygen saturations were okay it was recommended that she goes for infusion.  At the infusion center she was found to be severely hypoxic and was directed to the ED.  Her sats were apparently 78% on 3 L in ED to ED she was placed on 7 L.  She underwent a CT angiogram which was negative for PE but also did not show much in terms of viral pneumonia findings.  BNP was normal at 80  Subjective / 24h Interval events: No complaints, doing well.  Assessment & Plan: Principal Problem Acute on chronic hypoxic respiratory failure, underlying pulmonary hypertension -Imaging not necessarily very conclusive for viral pneumonia however clinically it appears that she has been having URI type symptoms and she is now more hypoxic. -Continue steroids, Remdesivir, wean off oxygen as tolerated.  She is on 4 L this morning but is having into the low 80s with walking. Repeat Lasix  Active Problems Essential hypertension -Continue amlodipine, lisinopril, metoprolol.  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 amLODipine  5 mg Oral Daily  . vitamin C  500 mg Oral Daily  . aspirin  81 mg Oral Daily  . clopidogrel  75 mg Oral Daily  . enoxaparin (LOVENOX) injection  40 mg Subcutaneous Q24H  .  gabapentin  100 mg Oral QHS  . latanoprost  1 drop Both Eyes QHS  . levothyroxine  50 mcg Oral QAC breakfast  . lisinopril  20 mg Oral Daily  . mouth rinse  15 mL Mouth Rinse BID  . methylPREDNISolone (SOLU-MEDROL) injection  30 mg Intravenous Q12H  . metoprolol succinate  25 mg Oral Daily  . simvastatin  20 mg Oral Daily  . zinc sulfate  220 mg Oral Daily   Continuous Infusions: . remdesivir 100 mg in NS 100 mL 100 mg (05/03/20 0946)   PRN Meds:.acetaminophen, albuterol, chlorpheniramine-HYDROcodone, diazepam, fluticasone, guaiFENesin-dextromethorphan, polyvinyl alcohol  Diet Orders (From admission, onward)    Start     Ordered   05/01/20 1711  Diet Heart Room service appropriate? Yes; Fluid consistency: Thin  Diet effective now       Question Answer Comment  Room service appropriate? Yes   Fluid consistency: Thin      05/01/20 1711          DVT prophylaxis: enoxaparin (LOVENOX) injection 40 mg Start: 05/01/20 1800     Code Status: Full Code  Family Communication: daughter over the phone  Status is: Inpatient  Remains inpatient appropriate because:Inpatient level of care appropriate due to severity of illness   Dispo: The patient is from: Home              Anticipated d/c is to: Home              Anticipated d/c date is: 3 days  Patient currently is not medically stable to d/c.   Consultants:  None   Procedures:  None   Microbiology  None   Antimicrobials: None     Objective: Vitals:   05/02/20 1535 05/02/20 2127 05/03/20 0530 05/03/20 0600  BP: 132/71 (!) 143/66 137/66   Pulse: 69 63 63   Resp: _0 Temp: 98.2 F (36.8 C) 97.6 F (36.4 C) (!) 97.5 F (36.4 C)   TempSrc: Oral Oral Oral   SpO2: 99% 98% (!) 85% (!) 88%   No intake or output data in the 24 hours ending 05/03/20 1420 There were no vitals filed for this visit.  Examination:  Constitutional: No distress Eyes: No icterus ENMT: mmm.  Neck: normal,  supple Respiratory: Overall distant breath sounds but no wheezing, no crackles Cardiovascular: Regular rate and rhythm, no murmurs, no edema Abdomen: Soft, nontender, nondistended, bowel sounds positive Musculoskeletal: no clubbing / cyanosis.  Skin: No rashes seen Neurologic: Nonfocal   Data Reviewed: I have independently reviewed following labs and imaging studies   CBC: Recent Labs  Lab 05/01/20 1232 05/02/20 0407 05/03/20 0452  WBC 6.1 4.3 6.4  NEUTROABS 2.6 2.5 4.0  HGB 12.6 12.7 12.7  HCT 39.8 41.1 40.5  MCV 100.8* 102.5* 99.8  PLT 214 186 301   Basic Metabolic Panel: Recent Labs  Lab 05/01/20 1232 05/02/20 0407 05/03/20 0452  NA 140 138 138  K 3.7 4.4 4.3  CL 106 109 106  CO2 23 19* 23  GLUCOSE 103* 128* 121*  BUN 12 10 25*  CREATININE 0.92 0.83 0.94  CALCIUM 9.4 9.2 9.6  MG  --  2.2 2.0   Liver Function Tests: Recent Labs  Lab 05/01/20 1232 05/02/20 0407 05/03/20 0452  AST 44* 31 27  ALT 35 26 24  ALKPHOS 74 63 65  BILITOT 0.4 0.4 0.4  PROT 8.6* 7.8 8.4*  ALBUMIN 4.0 3.7 4.0   Coagulation Profile: No results for input(s): INR, PROTIME in the last 168 hours. HbA1C: No results for input(s): HGBA1C in the last 72 hours. CBG: No results for input(s): GLUCAP in the last 168 hours.  Recent Results (from the past 240 hour(s))  Resp Panel by RT-PCR (Flu A&B, Covid) Nasopharyngeal Swab     Status: Abnormal   Collection Time: 04/29/20  6:24 PM   Specimen: Nasopharyngeal Swab; Nasopharyngeal(NP) swabs in vial transport medium  Result Value Ref Range Status   SARS Coronavirus 2 by RT PCR POSITIVE (A) NEGATIVE Final    Comment: RESULT CALLED TO, READ BACK BY AND VERIFIED WITH: Assunta Found RN 1956 04/29/20 A BROWNING (NOTE) SARS-CoV-2 target nucleic acids are DETECTED.  The SARS-CoV-2 RNA is generally detectable in upper respiratory specimens during the acute phase of infection. Positive results are indicative of the presence of the identified virus, but  do not rule out bacterial infection or co-infection with other pathogens not detected by the test. Clinical correlation with patient history and other diagnostic information is necessary to determine patient infection status. The expected result is Negative.  Fact Sheet for Patients: EntrepreneurPulse.com.au  Fact Sheet for Healthcare Providers: IncredibleEmployment.be  This test is not yet approved or cleared by the [place] FDA and  has been authorized for detection and/or diagnosis of SARS-CoV-2 by FDA under an Emergency Use Authorization (EUA).  This EUA will remain in effect (meaning this test can  be used) for the duration of  the COVID-19 declaration under Section 564(b)(1) of the Act, 21 U.S.C. section  360bbb-3(b)(1), unless the authorization is terminated or revoked sooner.     Influenza A by PCR NEGATIVE NEGATIVE Final   Influenza B by PCR NEGATIVE NEGATIVE Final    Comment: (NOTE) The Xpert Xpress SARS-CoV-2/FLU/RSV plus assay is intended as an aid in the diagnosis of influenza from Nasopharyngeal swab specimens and should not be used as a sole basis for treatment. Nasal washings and aspirates are unacceptable for Xpert Xpress SARS-CoV-2/FLU/RSV testing.  Fact Sheet for Patients: EntrepreneurPulse.com.au  Fact Sheet for Healthcare Providers: IncredibleEmployment.be  This test is not yet approved or cleared by the [place] FDA and has been authorized for detection and/or diagnosis of SARS-CoV-2 by FDA under an Emergency Use Authorization (EUA). This EUA will remain in effect (meaning this test can be used) for the duration of the COVID-19 declaration under Section 564(b)(1) of the Act, 21 U.S.C. section 360bbb-3(b)(1), unless the authorization is terminated or revoked.  Performed at [facility], [place] [address] [place], [place] [address]   Blood Culture (routine x 2)      Status: None (Preliminary result)   Collection Time: 05/01/20 12:31 PM   Specimen: BLOOD  Result Value Ref Range Status   Specimen Description   Final    BLOOD LEFT ANTECUBITAL Performed at [facility] [address], [place], [place] [address]    Special Requests   Final    BOTTLES DRAWN AEROBIC AND ANAEROBIC Blood Culture adequate volume Performed at [facility] [address] [place], [place] [address]    Culture   Final    NO GROWTH < 24 HOURS Performed at [facility] [address] [place], [place] [address]    Report Status PENDING  Incomplete  Blood Culture (routine x 2)     Status: None (Preliminary result)   Collection Time: 05/01/20 12:32 PM   Specimen: BLOOD LEFT FOREARM  Result Value Ref Range Status   Specimen Description   Final    BLOOD LEFT FOREARM Performed at [facility] [address], [place], [place] [address]    Special Requests   Final    BOTTLES DRAWN AEROBIC ONLY Blood Culture adequate volume Performed at [facility] [address] [place], [place] [address]    Culture   Final    NO GROWTH < 24 HOURS Performed at [facility] [address] [place], [place] [address]    Report Status PENDING  Incomplete     Radiology Studies: No results found.  Marzetta Board, MD, PhD [facility]  Between 7 am - 7 pm I am available, please contact me via Amion or Securechat  Between 7 pm - 7 am I am not available, please contact night coverage MD/APP via Amion

## 2020-05-03 NOTE — Plan of Care (Signed)

## 2020-05-03 NOTE — Progress Notes (Signed)
Oxygen saturation at rest on 3L 92%.  With ambulation on 3L O2 sat dropped to 84%. Patient continued to have regular, unlabored respiratory effort.

## 2020-05-04 DIAGNOSIS — U071 COVID-19: Secondary | ICD-10-CM | POA: Diagnosis not present

## 2020-05-04 DIAGNOSIS — R0902 Hypoxemia: Secondary | ICD-10-CM | POA: Diagnosis not present

## 2020-05-04 LAB — CBC WITH DIFFERENTIAL/PLATELET
Abs Immature Granulocytes: 0.02 10*3/uL (ref 0.00–0.07)
Basophils Absolute: 0 10*3/uL (ref 0.0–0.1)
Basophils Relative: 0 %
Eosinophils Absolute: 0 10*3/uL (ref 0.0–0.5)
Eosinophils Relative: 0 %
HCT: 39.9 % (ref 36.0–46.0)
Hemoglobin: 13.1 g/dL (ref 12.0–15.0)
Immature Granulocytes: 0 %
Lymphocytes Relative: 25 %
Lymphs Abs: 2.1 10*3/uL (ref 0.7–4.0)
MCH: 31.9 pg (ref 26.0–34.0)
MCHC: 32.8 g/dL (ref 30.0–36.0)
MCV: 97.1 fL (ref 80.0–100.0)
Monocytes Absolute: 0.5 10*3/uL (ref 0.1–1.0)
Monocytes Relative: 6 %
Neutro Abs: 5.9 10*3/uL (ref 1.7–7.7)
Neutrophils Relative %: 69 %
Platelets: 223 10*3/uL (ref 150–400)
RBC: 4.11 MIL/uL (ref 3.87–5.11)
RDW: 14.1 % (ref 11.5–15.5)
WBC: 8.6 10*3/uL (ref 4.0–10.5)
nRBC: 0 % (ref 0.0–0.2)

## 2020-05-04 LAB — COMPREHENSIVE METABOLIC PANEL
ALT: 21 U/L (ref 0–44)
AST: 25 U/L (ref 15–41)
Albumin: 3.8 g/dL (ref 3.5–5.0)
Alkaline Phosphatase: 59 U/L (ref 38–126)
Anion gap: 10 (ref 5–15)
BUN: 31 mg/dL — ABNORMAL HIGH (ref 8–23)
CO2: 21 mmol/L — ABNORMAL LOW (ref 22–32)
Calcium: 9.1 mg/dL (ref 8.9–10.3)
Chloride: 107 mmol/L (ref 98–111)
Creatinine, Ser: 1.07 mg/dL — ABNORMAL HIGH (ref 0.44–1.00)
GFR, Estimated: 52 mL/min — ABNORMAL LOW (ref >60)
Glucose, Bld: 123 mg/dL — ABNORMAL HIGH (ref 70–99)
Potassium: 4 mmol/L (ref 3.5–5.1)
Sodium: 138 mmol/L (ref 135–145)
Total Bilirubin: 0.5 mg/dL (ref 0.3–1.2)
Total Protein: 7.8 g/dL (ref 6.5–8.1)

## 2020-05-04 LAB — D-DIMER, QUANTITATIVE: D-Dimer, Quant: 0.84 ug/mL-FEU — ABNORMAL HIGH (ref 0.00–0.50)

## 2020-05-04 LAB — C-REACTIVE PROTEIN: CRP: 0.6 mg/dL (ref <1.0)

## 2020-05-04 LAB — FERRITIN: Ferritin: 161 ng/mL (ref 11–307)

## 2020-05-04 LAB — MAGNESIUM: Magnesium: 2.2 mg/dL (ref 1.7–2.4)

## 2020-05-04 MED ORDER — DEXAMETHASONE 6 MG PO TABS: 6.0000 mg | ORAL_TABLET | Freq: Every day | ORAL | 0 refills | Status: AC

## 2020-05-04 MED ORDER — GUAIFENESIN-DM 100-10 MG/5ML PO SYRP: 10.0000 mL | ORAL_SOLUTION | ORAL | 0 refills | Status: AC | PRN

## 2020-05-04 MED ORDER — DOXYCYCLINE HYCLATE 100 MG PO CAPS: 100.0000 mg | ORAL_CAPSULE | Freq: Two times a day (BID) | ORAL | 0 refills | Status: AC

## 2020-05-04 NOTE — Progress Notes (Signed)
Pt. Discharged via wheelchair accompanied by NT and RN. Pt. Is in no acute distress, vital sign stable. Discharge instruction and education discussed with patient. Pt. Verbalized understanding. All personal  belongings are with the pt.

## 2020-05-04 NOTE — Plan of Care (Signed)
  Problem: Education: Goal: Knowledge of General Education information will improve Description: Including pain rating scale, medication(s)/side effects and non-pharmacologic comfort measures Outcome: Adequate for Discharge   Problem: Health Behavior/Discharge Planning: Goal: Ability to manage health-related needs will improve Outcome: Adequate for Discharge   Problem: Clinical Measurements: Goal: Ability to maintain clinical measurements within normal limits will improve Outcome: Adequate for Discharge Goal: Will remain free from infection Outcome: Adequate for Discharge Goal: Diagnostic test results will improve Outcome: Adequate for Discharge Goal: Respiratory complications will improve Outcome: Adequate for Discharge Goal: Cardiovascular complication will be avoided Outcome: Adequate for Discharge   Problem: Activity: Goal: Risk for activity intolerance will decrease Outcome: Adequate for Discharge   Problem: Elimination: Goal: Will not experience complications related to bowel motility Outcome: Adequate for Discharge Goal: Will not experience complications related to urinary retention Outcome: Adequate for Discharge   Problem: Pain Managment: Goal: General experience of comfort will improve Outcome: Adequate for Discharge   Problem: Safety: Goal: Ability to remain free from injury will improve Outcome: Adequate for Discharge   Problem: Skin Integrity: Goal: Risk for impaired skin integrity will decrease Outcome: Adequate for Discharge   Problem: Education: Goal: Knowledge of risk factors and measures for prevention of condition will improve Outcome: Adequate for Discharge   Problem: Coping: Goal: Psychosocial and spiritual needs will be supported Outcome: Adequate for Discharge   Problem: Respiratory: Goal: Will maintain a patent airway Outcome: Adequate for Discharge Goal: Complications related to the disease process, condition or treatment will be avoided  or minimized Outcome: Adequate for Discharge

## 2020-05-04 NOTE — Discharge Summary (Signed)
Physician Discharge Summary  [name] JQZ:[id number] DOB: 01-21-1941 DOA: 05/01/2020  PCP: [name], MD  Admit date: 05/01/2020 Discharge date: 05/04/2020  Admitted From: home Disposition:  home  Recommendations for Outpatient Follow-up:  1. Follow up with PCP in 1-2 weeks  Home Health: none Equipment/Devices: 3L O2, chronic  Discharge Condition: stable CODE STATUS: Full code Diet recommendation: regular  HPI: Per admitting MD, [name] is a 80 y.o. female with medical history significant of pulmonary HTN, HTN, chronic hypoxic respiratory failure. Presenting with hypoxia. Her daughter states that she always checks the patient's O2 levels because she's on 3L chronic O2 at home. She noticed 2 days ago that the patient was having a little cough, and so she checked her pulse-ox. She found it to be low, so she took her to the ED. She was found to be COVID+, but her O2 saturations were ok. Her exam was ok. It was recommended that she go for MAB infusion. She reported today to the infusion center. During their evaluation, she was found to be severely hypoxic. She was directed to come to the ED. She denies any other aggravating or alleviating factors.    Hospital Course / Discharge diagnoses: Principal Problem Acute on chronic hypoxic respiratory failure, underlying pulmonary hypertension, Covid 19 -patient was admitted to the hospital with slightly worsened hypoxia from her baseline, at home using 3 L of oxygen and required 5 on admission.  She underwent a CT angiogram which was negative for PE, but also negative for multifocal pneumonia.  She has a history of pulmonary hypertension and she was placed on IV Lasix.  She was also placed on remdesivir along with steroids.  Clinically improved, her oxygen level is stable on 3 L which is chronic for her, completed 4 doses of remdesivir, she is at baseline and will be discharged home in stable condition.  She will have steroids  for 16 additional days to complete a 10-day course and empiric doxycycline for 3 days for slightly worsening chronic productive cough.  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   albuterol 108 (90 Base) MCG/ACT inhaler Commonly known as: VENTOLIN HFA TAKE 2 PUFFS BY MOUTH EVERY 6 HOURS AS NEEDED FOR WHEEZE OR SHORTNESS OF BREATH What changed: See the new instructions.   Alive [employer] Tabs Take 1 tablet by mouth daily.   amLODipine 5 MG tablet Commonly known as: NORVASC TAKE 1 TABLET BY MOUTH EVERY DAY   aspirin 81 MG chewable tablet Chew 81 mg by mouth daily.   calcium-vitamin D 500-200 MG-UNIT tablet Commonly known as: OSCAL WITH D Take 1 tablet by mouth daily with breakfast.   cetirizine 10 MG tablet Commonly known as: ZYRTEC Take 10 mg daily as needed by mouth for allergies.   clopidogrel 75 MG tablet Commonly known as: PLAVIX TAKE 1 TABLET BY MOUTH EVERY DAY   dexamethasone 6 MG tablet Commonly known as: DECADRON Take  1 tablet (6 mg total) by mouth daily for 6 days.   diazepam 2 MG tablet Commonly known as: VALIUM Take 0.5 tablets (1 mg total) by mouth every 12 (twelve) hours as needed (vertigo).   diclofenac Sodium 1 % Gel Commonly known as: Voltaren Apply 2 g topically 4 (four) times daily.   docusate sodium 100 MG capsule Commonly known as: [employer] Stool Softener TAKE 1 CAPSULE (100 MG TOTAL)  BY MOUTH DAILY AS NEEDED FOR MILD CONSTIPATION. What changed:   how much to take  how to take this  when to take this  reasons to take this   doxycycline 100 MG capsule Commonly known as: VIBRAMYCIN Take 1 capsule (100 mg total) by mouth 2 (two) times daily for 3 days.   fluticasone 50 MCG/ACT nasal spray Commonly known as: FLONASE Place 1 spray into both nostrils daily as needed for allergies or rhinitis (for sinus congestion).   gabapentin 100 MG capsule Commonly known as: NEURONTIN TAKE 1 CAPSULE (100 MG TOTAL) BY MOUTH AT BEDTIME.   guaiFENesin-dextromethorphan 100-10 MG/5ML syrup Commonly known as: ROBITUSSIN DM Take 10 mLs by mouth every 4 (four) hours as needed for cough.   latanoprost 0.005 % ophthalmic solution Commonly known as: XALATAN Place 1 drop at bedtime into both eyes.   levothyroxine 50 MCG tablet Commonly known as: SYNTHROID TAKE 1 TABLET BY MOUTH EVERY DAY What changed: when to take this   lisinopril 20 MG tablet Commonly known as: ZESTRIL TAKE 1 TABLET BY MOUTH EVERY DAY   metoprolol succinate 25 MG 24 hr tablet Commonly known as: TOPROL-XL TAKE 1 TABLET BY MOUTH EVERY DAY   PROBIOTIC PO Take 1 capsule by mouth daily.   simvastatin 20 MG tablet Commonly known as: ZOCOR TAKE 1 TABLET BY MOUTH EVERYDAY AT BEDTIME What changed: See the new instructions.   Systane Ultra 0.4-0.3 % Soln Generic drug: Polyethyl Glycol-Propyl Glycol Place 1 drop into both eyes daily as needed (dry eyes).      Consultations:  None   Procedures/Studies:  CT ANGIO CHEST PE W OR WO CONTRAST  Result Date: 05/01/2020 CLINICAL DATA:  Shortness of breath and COVID-19 positivity EXAM: CT ANGIOGRAPHY CHEST WITH CONTRAST TECHNIQUE: Multidetector CT imaging of the chest was performed using the standard protocol during bolus administration of intravenous contrast. Multiplanar CT image reconstructions and MIPs were obtained to evaluate the vascular anatomy. CONTRAST:   74m OMNIPAQUE IOHEXOL 350 MG/ML SOLN COMPARISON:  Chest x-ray from earlier in the same day, 12/25/2019 CT. FINDINGS: Cardiovascular: Atherosclerotic calcifications of the thoracic aorta are noted without aneurysmal dilatation or dissection. No cardiac enlargement is seen. Coronary calcifications are noted. The pulmonary artery shows a normal branching pattern bilaterally. No focal filling defect is identified to suggest pulmonary embolism. Right innominate artery stenting is seen. This is stable from the prior exam. Mediastinum/Nodes: Stable mediastinal and hilar nodes are identified unchanged from September of 2021. Again these are likely reactive in nature given their stability. Esophagus is within normal limits. No new adenopathy is seen. Lungs/Pleura: The lungs demonstrate diffuse emphysematous changes. Stable nodular changes are noted in the right upper lobe best seen on image number 38 and 34 of series 6. The overall appearance is stable from the prior study. The larger of these nodules again measures 7 mm. No focal infiltrate is seen. No sequelae to correspond with the given clinical history of COVID-19 positivity are seen. No pleural effusion is noted. Upper Abdomen: Visualized upper abdomen shows no acute abnormality. Calcified lymph  node is noted in the porta hepatis stable from a prior exam of 2017. Musculoskeletal: Degenerative changes of the thoracic spine are noted. Old rib fractures are seen. No acute bony abnormality is noted. Review of the MIP images confirms the above findings. IMPRESSION: No evidence of pulmonary emboli. Stable hilar and mediastinal lymph nodes again likely reactive in nature. Stable nodular densities in the right upper lobe the largest of which measures 7 mm. Given their stability, then future CT at 14-20 months (from today's scan) is considered optional for low-risk patients, but is recommended for high-risk patients. This recommendation follows the consensus statement:  Guidelines for Management of Incidental Pulmonary Nodules Detected on CT Images: From the Fleischner Society 2017; Radiology 2017; 284:228-243. Aortic Atherosclerosis (ICD10-I70.0) and Emphysema (ICD10-J43.9). Electronically Signed   By: Inez Catalina M.D.   On: 05/01/2020 22:59   DG Chest Port 1 View  Result Date: 05/01/2020 CLINICAL DATA:  Shortness of breath. EXAM: PORTABLE CHEST 1 VIEW COMPARISON:  April 29, 2020. FINDINGS: Stable cardiomediastinal silhouette. No pneumothorax or pleural effusion is noted. Stable bibasilar densities are noted concerning for subsegmental atelectasis or scarring. Bony thorax is unremarkable. IMPRESSION: Stable bibasilar subsegmental atelectasis or scarring. Aortic Atherosclerosis (ICD10-I70.0). Electronically Signed   By: Marijo Conception M.D.   On: 05/01/2020 13:05   DG Chest Portable 1 View  Result Date: 04/29/2020 CLINICAL DATA:  COVID EXAM: PORTABLE CHEST 1 VIEW COMPARISON:  12/27/2019, 12/25/2019 FINDINGS: Mild diffuse reticular changes likely due to chronic interstitial changes. No acute superimposed focal airspace disease. No effusion. Stable cardiomediastinal silhouette with aortic atherosclerosis. No pneumothorax IMPRESSION: Chronic interstitial changes without acute superimposed airspace disease. Electronically Signed   By: Donavan Foil M.D.   On: 04/29/2020 22:18      Subjective: - no chest pain, shortness of breath, no abdominal pain, nausea or vomiting.   Discharge Exam: BP 126/70 (BP Location: Left Arm)   Pulse (!) 55   Temp 97.7 F (36.5 C) (Oral)   Resp 16   SpO2 96%   General: Pt is alert, awake, not in acute distress Cardiovascular: RRR, S1/S2 +, no rubs, no gallops Respiratory: CTA bilaterally, no wheezing, no rhonchi Abdominal: Soft, NT, ND, bowel sounds + Extremities: no edema, no cyanosis   The results of significant diagnostics from this hospitalization (including imaging, microbiology, ancillary and laboratory) are listed  below for reference.     Microbiology: Recent Results (from the past 240 hour(s))  Resp Panel by RT-PCR (Flu A&B, Covid) Nasopharyngeal Swab     Status: Abnormal   Collection Time: 04/29/20  6:24 PM   Specimen: Nasopharyngeal Swab; Nasopharyngeal(NP) swabs in vial transport medium  Result Value Ref Range Status   SARS Coronavirus 2 by RT PCR POSITIVE (A) NEGATIVE Final    Comment: RESULT CALLED TO, READ BACK BY AND VERIFIED WITH: Assunta Found RN 1956 04/29/20 A BROWNING (NOTE) SARS-CoV-2 target nucleic acids are DETECTED.  The SARS-CoV-2 RNA is generally detectable in upper respiratory specimens during the acute phase of infection. Positive results are indicative of the presence of the identified virus, but do not rule out bacterial infection or co-infection with other pathogens not detected by the test. Clinical correlation with patient history and other diagnostic information is necessary to determine patient infection status. The expected result is Negative.  Fact Sheet for Patients: EntrepreneurPulse.com.au  Fact Sheet for Healthcare Providers: IncredibleEmployment.be  This test is not yet approved or cleared by the [place] FDA and  has been authorized for detection and/or  diagnosis of SARS-CoV-2 by FDA under an Emergency Use Authorization (EUA).  This EUA will remain in effect (meaning this test can  be used) for the duration of  the COVID-19 declaration under Section 564(b)(1) of the Act, 21 U.S.C. section 360bbb-3(b)(1), unless the authorization is terminated or revoked sooner.     Influenza A by PCR NEGATIVE NEGATIVE Final   Influenza B by PCR NEGATIVE NEGATIVE Final    Comment: (NOTE) The Xpert Xpress SARS-CoV-2/FLU/RSV plus assay is intended as an aid in the diagnosis of influenza from Nasopharyngeal swab specimens and should not be used as a sole basis for treatment. Nasal washings and aspirates are unacceptable for Xpert  Xpress SARS-CoV-2/FLU/RSV testing.  Fact Sheet for Patients: EntrepreneurPulse.com.au  Fact Sheet for Healthcare Providers: IncredibleEmployment.be  This test is not yet approved or cleared by the [place] FDA and has been authorized for detection and/or diagnosis of SARS-CoV-2 by FDA under an Emergency Use Authorization (EUA). This EUA will remain in effect (meaning this test can be used) for the duration of the COVID-19 declaration under Section 564(b)(1) of the Act, 21 U.S.C. section 360bbb-3(b)(1), unless the authorization is terminated or revoked.  Performed at [facility], [place] [address], [place], [place] [address]   Blood Culture (routine x 2)     Status: None (Preliminary result)   Collection Time: 05/01/20 12:31 PM   Specimen: BLOOD  Result Value Ref Range Status   Specimen Description   Final    BLOOD LEFT ANTECUBITAL Performed at [facility] [address], [place], [place] [address]    Special Requests   Final    BOTTLES DRAWN AEROBIC AND ANAEROBIC Blood Culture adequate volume Performed at [facility] [address] [place], [place] [address]    Culture   Final    NO GROWTH 2 DAYS Performed at [facility] [address], [place], [place] [address]    Report Status PENDING  Incomplete  Blood Culture (routine x 2)     Status: None (Preliminary result)   Collection Time: 05/01/20 12:32 PM   Specimen: BLOOD LEFT FOREARM  Result Value Ref Range Status   Specimen Description   Final    BLOOD LEFT FOREARM Performed at [facility] [address], [place], [place] [address]    Special Requests   Final    BOTTLES DRAWN AEROBIC ONLY Blood Culture adequate volume Performed at [facility] [address], [place], [place] [address]    Culture   Final    NO GROWTH 2 DAYS Performed at [facility] [address],  [place], [place] [address]    Report Status PENDING  Incomplete     Labs: Basic Metabolic Panel: Recent Labs  Lab 05/01/20 1232 05/02/20 0407 05/03/20 0452 05/04/20 0333  NA 140 138 138 138  K 3.7 4.4 4.3 4.0  CL 106 109 106 107  CO2 23 19* 23 21*  GLUCOSE 103* 128* 121* 123*  BUN 12 10 25* 31*  CREATININE 0.92 0.83 0.94 1.07*  CALCIUM 9.4 9.2 9.6 9.1  MG  --  2.2 2.0 2.2   Liver Function Tests: Recent Labs  Lab 05/01/20 1232 05/02/20 0407 05/03/20 0452 05/04/20 0333  AST 44* _0 ALT 35 _1 ALKPHOS 74 63 65 59  BILITOT 0.4 0.4 0.4 0.5  PROT 8.6* 7.8 8.4* 7.8  ALBUMIN 4.0 3.7 4.0 3.8   CBC: Recent  Labs  Lab 05/01/20 1232 05/02/20 0407 05/03/20 0452 05/04/20 0333  WBC 6.1 4.3 6.4 8.6  NEUTROABS 2.6 2.5 4.0 5.9  HGB 12.6 12.7 12.7 13.1  HCT 39.8 41.1 40.5 39.9  MCV 100.8* 102.5* 99.8 97.1  PLT 214 186 212 223   CBG: No results for input(s): GLUCAP in the last 168 hours. Hgb A1c No results for input(s): HGBA1C in the last 72 hours. Lipid Profile Recent Labs    05/01/20 1232  TRIG 84   Thyroid function studies No results for input(s): TSH, T4TOTAL, T3FREE, THYROIDAB in the last 72 hours.  Invalid input(s): FREET3 Urinalysis    Component Value Date/Time   COLORURINE YELLOW 09/23/2015 0012   APPEARANCEUR Clear 01/15/2020 1225   LABSPEC 1.015 11/25/2019 1137   PHURINE 6.5 11/25/2019 1137   GLUCOSEU Negative 01/15/2020 1225   HGBUR NEGATIVE 11/25/2019 1137   BILIRUBINUR Negative 01/15/2020 1225   KETONESUR NEGATIVE 11/25/2019 1137   PROTEINUR Negative 01/15/2020 1225   PROTEINUR NEGATIVE 11/25/2019 1137   UROBILINOGEN 0.2 11/25/2019 1137   NITRITE Negative 01/15/2020 1225   NITRITE NEGATIVE 11/25/2019 1137   LEUKOCYTESUR Negative 01/15/2020 1225   LEUKOCYTESUR NEGATIVE 11/25/2019 1137    FURTHER DISCHARGE INSTRUCTIONS:   Get Medicines reviewed and adjusted: Please take all your medications with you for your next visit with your  Primary MD   Laboratory/radiological data: Please request your Primary MD to go over all hospital tests and procedure/radiological results at the follow up, please ask your Primary MD to get all Hospital records sent to his/her office.   In some cases, they will be blood work, cultures and biopsy results pending at the time of your discharge. Please request that your primary care M.D. goes through all the records of your hospital data and follows up on these results.   Also Note the following: If you experience worsening of your admission symptoms, develop shortness of breath, life threatening emergency, suicidal or homicidal thoughts you must seek medical attention immediately by calling 911 or calling your MD immediately  if symptoms less severe.   You must read complete instructions/literature along with all the possible adverse reactions/side effects for all the Medicines you take and that have been prescribed to you. Take any new Medicines after you have completely understood and accpet all the possible adverse reactions/side effects.    Do not drive when taking Pain medications or sleeping medications (Benzodaizepines)   Do not take more than prescribed Pain, Sleep and Anxiety Medications. It is not advisable to combine anxiety,sleep and pain medications without talking with your primary care practitioner   Special Instructions: If you have smoked or chewed Tobacco  in the last 2 yrs please stop smoking, stop any regular Alcohol  and or any Recreational drug use.   Wear Seat belts while driving.   Please note: You were cared for by a hospitalist during your hospital stay. Once you are discharged, your primary care physician will handle any further medical issues. Please note that NO REFILLS for any discharge medications will be authorized once you are discharged, as it is imperative that you return to your primary care physician (or establish a relationship with a primary care physician if  you do not have one) for your post hospital discharge needs so that they can reassess your need for medications and monitor your lab values.  Time coordinating discharge: 40 minutes  SIGNED:  Marzetta Board, MD, PhD 05/04/2020, 9:29 AM

## 2020-05-04 NOTE — Plan of Care (Signed)
Pt verbalizes the importance of deep breathing exercises to aid in improving lung functions

## 2020-05-05 ENCOUNTER — Other Ambulatory Visit: Payer: Self-pay | Admitting: Physician Assistant

## 2020-05-05 ENCOUNTER — Telehealth: Payer: Self-pay

## 2020-05-05 DIAGNOSIS — I10 Essential (primary) hypertension: Secondary | ICD-10-CM

## 2020-05-05 NOTE — Telephone Encounter (Signed)
Transition Care Management Unsuccessful Follow-up Telephone Call  Date of discharge and from where:  05/04/2020, [facility]   Attempts:  1st Attempt  Reason for unsuccessful TCM follow-up call:  Unable to reach patient - call placed to patient twice # [phone]. The person who answered said hello and then after then hung up after then CM requested to speak with the patient.   She has an appointment with Dr Wynetta Emery 05/08/2020.

## 2020-05-06 ENCOUNTER — Telehealth: Payer: Self-pay

## 2020-05-06 LAB — CULTURE, BLOOD (ROUTINE X 2)
Culture: NO GROWTH
Culture: NO GROWTH
Special Requests: ADEQUATE
Special Requests: ADEQUATE

## 2020-05-06 NOTE — Telephone Encounter (Signed)
Transition Care Management Follow-up Telephone Call  Date of discharge and from where: 05/04/2020, [facility]   How have you been since you were released from the hospital? She said she feels good, much better  Any questions or concerns? No however she said no one told her that she had COVID but she had gone her monoclonal antibody infusion as an outpatient.    Items Reviewed:  Did the pt receive and understand the discharge instructions provided? Yes   Medications obtained and verified? Yes  - she said she has all medications including the steroid and antibiotic.  She did not have any questions about the med regime and said that she manages the medications by herself.   Other? No   Any new allergies since your discharge? No   Do you have support at home? Yes  - she said she lives alone but he daughters check on her,   Home Care and Equipment/Supplies: Were home health services ordered? no If so, what is the name of the agency? n/a  Has the agency set up a time to come to the patient's home? not applicable Were any new equipment or medical supplies ordered?  No What is the name of the medical supply agency? n/a Were you able to get the supplies/equipment? N/a Do you have any questions related to the use of the equipment or supplies? No, n/a  Has been using O2@ 3L continuously.  She said that they told her in the hospital to increase to 4L if needed.  Her daughter checks her O2 sats and the patient reports they have been 95% with the O2.   Functional Questionnaire: (I = Independent and D = Dependent) ADLs: independent   Follow up appointments reviewed:   PCP Hospital f/u appt confirmed? Yes  - Dr Wynetta Emery 05/08/2020, virtual/tele  [facility] f/u appt confirmed? none scheduled at this time .  Are transportation arrangements needed? No   If their condition worsens, is the pt aware to call PCP or go to the Emergency Dept.?yes  Was the patient provided with  contact information for the PCP's office or ED? She said she has the phone number for the clinic  Was to pt encouraged to call back with questions or concerns? yes

## 2020-05-08 ENCOUNTER — Ambulatory Visit: Payer: Medicare Other | Attending: Internal Medicine | Admitting: Internal Medicine

## 2020-05-08 ENCOUNTER — Other Ambulatory Visit: Payer: Self-pay

## 2020-05-08 DIAGNOSIS — Z09 Encounter for follow-up examination after completed treatment for conditions other than malignant neoplasm: Secondary | ICD-10-CM | POA: Diagnosis not present

## 2020-05-08 DIAGNOSIS — J9611 Chronic respiratory failure with hypoxia: Secondary | ICD-10-CM

## 2020-05-08 DIAGNOSIS — I272 Pulmonary hypertension, unspecified: Secondary | ICD-10-CM

## 2020-05-08 DIAGNOSIS — U071 COVID-19: Secondary | ICD-10-CM | POA: Diagnosis not present

## 2020-05-08 DIAGNOSIS — R918 Other nonspecific abnormal finding of lung field: Secondary | ICD-10-CM

## 2020-05-08 NOTE — Progress Notes (Signed)
Virtual Visit via Telephone Note  I connected with [name] on 05/08/20 at 11:56 a.m by telephone and verified that I am speaking with the correct person using two identifiers.  Location: Patient: home Provider: office  the pt, her daughter, my CMA Ms. Sallyanne Havers and myself participated in this visit I discussed the limitations, risks, security and privacy concerns of performing an evaluation and management service by telephone and the availability of in person appointments. I also discussed with the patient that there may be a patient responsible charge related to this service. The patient expressed understanding and agreed to proceed.   History of Present Illness: HTN, hypothyroid, HL, chronic hypoxic respiratory failure on 3 L of O2, pulmonary hypertension (negative VQ scan, serology negative for CTD, sleep study negative for OSA, PFTs incomplete as pt unable to follow instructions to complete.   Followed by pulmonary 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  Today's visit is for transition of care. Date of hospitalization 1/20-23/2022. Date of call from case worker: 05/06/2020  Patient hospitalized with acute on chronic hypoxic respiratory failure.  She was found to be Covid positive.  She was scheduled for monoclonal antibody infusion but was admitted instead because of significant hypoxia even on her baseline 3 L of O2.  CAT scan delete that CT angiogram was negative for PE or multifocal pneumonia.  Hilar and mediastinal adenopathy stable and nodular densities in the right upper lobe largest of which was 7 mm was also stable in size compared to previous study done in 12/2019.  Patient treated with steroids and remdesivir.  Today: Since hospital discharge,  patient states that she is doing good and feels good.  She denies any increased shortness of breath or any cough.  Daughter states that she gets a little winded if she tries to rush around.  O2 level is 95 to 96% on 3 L at rest.  Pulse ox drops into the 80s when she is up moving around.  Daughter started increasing oxygen to 4 L with ambulation as of yesterday.  She has 2 more doses of the steroid and 1 more dose of doxycycline. -She has not received the COVID-19 vaccine but is now interested in getting it. Patient would like for her to get a life alert. She had seen the pulmonologist [name] last month for follow-up on pulmonary hypertension and lung nodules.  She states she was told to follow-up with him again in March and to get repeat CAT scan imaging in April.  Outpatient Encounter Medications as of 05/08/2020  Medication Sig  . acetaminophen (TYLENOL) 325 MG tablet Take 650 mg every 6 (six) hours as needed by mouth for mild pain or headache.   . albuterol (VENTOLIN HFA) 108 (90 Base) MCG/ACT inhaler TAKE 2 PUFFS BY MOUTH EVERY 6 HOURS AS NEEDED FOR WHEEZE OR SHORTNESS OF BREATH (Patient taking differently: Inhale 2 puffs into the lungs every 6 (six) hours as needed for wheezing or shortness of breath.)  . amLODipine (NORVASC) 5 MG tablet TAKE 1 TABLET BY MOUTH EVERY DAY (Patient taking differently: Take 5 mg by mouth daily.)  . aspirin 81 MG chewable tablet Chew 81 mg by mouth daily.  . calcium-vitamin D (OSCAL WITH D) 500-200 MG-UNIT per tablet Take 1 tablet by mouth daily with breakfast.  . cetirizine (ZYRTEC) 10 MG  tablet Take 10 mg daily as needed by mouth for allergies.   Marland Kitchen clopidogrel (PLAVIX) 75 MG tablet TAKE 1 TABLET BY MOUTH EVERY DAY (Patient taking differently: Take 75 mg by mouth daily.)  . dexamethasone (DECADRON) 6 MG tablet Take 1 tablet (6 mg total) by mouth daily for 6 days.  . diazepam (VALIUM) 2 MG tablet Take 0.5 tablets (1 mg total) by mouth every 12 (twelve) hours as  needed (vertigo).  Marland Kitchen diclofenac Sodium (VOLTAREN) 1 % GEL Apply 2 g topically 4 (four) times daily.  Marland Kitchen docusate sodium ([employer] STOOL SOFTENER) 100 MG capsule TAKE 1 CAPSULE (100 MG TOTAL) BY MOUTH DAILY AS NEEDED FOR MILD CONSTIPATION. (Patient taking differently: Take 100 mg by mouth daily as needed for mild constipation. TAKE 1 CAPSULE (100 MG TOTAL) BY MOUTH DAILY AS NEEDED FOR MILD CONSTIPATION.)  . fluticasone (FLONASE) 50 MCG/ACT nasal spray Place 1 spray into both nostrils daily as needed for allergies or rhinitis (for sinus congestion).  . gabapentin (NEURONTIN) 100 MG capsule TAKE 1 CAPSULE (100 MG TOTAL) BY MOUTH AT BEDTIME.  Marland Kitchen guaiFENesin-dextromethorphan (ROBITUSSIN DM) 100-10 MG/5ML syrup Take 10 mLs by mouth every 4 (four) hours as needed for cough.  . latanoprost (XALATAN) 0.005 % ophthalmic solution Place 1 drop at bedtime into both eyes.  Marland Kitchen levothyroxine (SYNTHROID) 50 MCG tablet TAKE 1 TABLET BY MOUTH EVERY DAY (Patient taking differently: Take 50 mcg by mouth daily before breakfast.)  . lisinopril (ZESTRIL) 20 MG tablet TAKE 1 TABLET BY MOUTH EVERY DAY  . metoprolol succinate (TOPROL-XL) 25 MG 24 hr tablet TAKE 1 TABLET BY MOUTH EVERY DAY (Patient taking differently: Take 25 mg by mouth daily.)  . Multiple Vitamins-Minerals (ALIVE WOMENS ENERGY) TABS Take 1 tablet by mouth daily.  . Probiotic Product (PROBIOTIC PO) Take 1 capsule by mouth daily.  . simvastatin (ZOCOR) 20 MG tablet TAKE 1 TABLET BY MOUTH EVERYDAY AT BEDTIME (Patient taking differently: Take 20 mg by mouth at bedtime.)  . SYSTANE ULTRA 0.4-0.3 % SOLN Place 1 drop into both eyes daily as needed (dry eyes).   No facility-administered encounter medications on file as of 05/08/2020.     Observations/Objective: Lab Results  Component Value Date   WBC 8.6 05/04/2020   HGB 13.1 05/04/2020   HCT 39.9 05/04/2020   MCV 97.1 05/04/2020   PLT 223 05/04/2020     Chemistry      Component Value Date/Time   NA 138  05/04/2020 0333   NA 142 01/15/2020 1217   K 4.0 05/04/2020 0333   CL 107 05/04/2020 0333   CO2 21 (L) 05/04/2020 0333   BUN 31 (H) 05/04/2020 0333   BUN 10 01/15/2020 1217   CREATININE 1.07 (H) 05/04/2020 0333      Component Value Date/Time   CALCIUM 9.1 05/04/2020 0333   CALCIUM 7.3 (L) 04/18/2008 1710   ALKPHOS 59 05/04/2020 0333   AST 25 05/04/2020 0333   ALT 21 05/04/2020 0333   BILITOT 0.5 05/04/2020 0333   BILITOT 0.5 01/15/2020 1217       Assessment and Plan: 1. Hospital discharge follow-up  2. COVID-19 virus infection Patient clinically improved.  Advised her to start the COVID vaccine series in about 3 months.  3. Hypoxemic respiratory failure, chronic ([place]) Patient with significant hypoxia with ambulation.  Advised the daughter to increase portable O2 to 4 to 5 L with ambulation to keep oxygen level above 90.  When at rest she can keep it at 3 L.  At night she should keep it on 4 L as recommended by sleep specialist. I will have our caseworker touch base with her and give information on how to get life alert as she requested. - Ambulatory referral to Pulmonology  4. Pulmonary hypertension ([place]) - Ambulatory referral to Pulmonology  5. Lung nodule, multiple Advised patient that she may not need a repeat CAT scan again in April as she did have a CT angiogram during this hospitalization that showed the nodules are stable.  Will probably need repeat again in 12 to 18 months.     Follow Up Instructions: 2 mths   I discussed the assessment and treatment plan with the patient. The patient was provided an opportunity to ask questions and all were answered. The patient agreed with the plan and demonstrated an understanding of the instructions.   The patient was advised to call back or seek an in-person evaluation if the symptoms worsen or if the condition fails to improve as anticipated.  I provided 14 minutes of non-face-to-face time during this  encounter.   Karle Plumber, MD

## 2020-05-08 NOTE — Telephone Encounter (Signed)
At request of Dr Wynetta Emery, call placed to patient # [phone]  re. Life alert. The person who answered hung up after this CM introduced herself

## 2020-05-08 NOTE — Progress Notes (Signed)
Pt states she is feeling good

## 2020-05-16 NOTE — Telephone Encounter (Signed)
Call returned to the number provided # [phone]. Spoke to the patient and informed her that medicare does not pay for Life Alert systems. Medicaid does not usually pay for them but she could check with her DSS caseworker.  The patient said that she would share this information with her daughter, Olin Hauser who was not with her.  Instructed her to call this CM back if they have additional questions.

## 2020-05-16 NOTE — Telephone Encounter (Signed)
Copied from [place] [id number]. Topic: General - Other >> May 14, 2020 10:49 AM Jodie Echevaria wrote: Reason for CRM: Patient daughter Olin Hauser called in to remind Dr Wynetta Emery of the request that the patient had for the Life Alert call button they are waiting to hear from the social worker. Please call Ph# [phone]

## 2020-05-21 DIAGNOSIS — H401131 Primary open-angle glaucoma, bilateral, mild stage: Secondary | ICD-10-CM | POA: Diagnosis not present

## 2020-06-08 ENCOUNTER — Other Ambulatory Visit: Payer: Self-pay | Admitting: Internal Medicine

## 2020-06-08 DIAGNOSIS — I10 Essential (primary) hypertension: Secondary | ICD-10-CM

## 2020-06-26 ENCOUNTER — Other Ambulatory Visit: Payer: Self-pay

## 2020-06-26 ENCOUNTER — Encounter (HOSPITAL_COMMUNITY): Payer: Self-pay | Admitting: Emergency Medicine

## 2020-06-26 ENCOUNTER — Ambulatory Visit (HOSPITAL_COMMUNITY)
Admission: EM | Admit: 2020-06-26 | Discharge: 2020-06-26 | Disposition: A | Discharge status: Home / Self Care | Payer: Medicare Other | Attending: Family Medicine | Admitting: Family Medicine

## 2020-06-26 DIAGNOSIS — T162XXA Foreign body in left ear, initial encounter: Secondary | ICD-10-CM | POA: Diagnosis not present

## 2020-06-26 NOTE — ED Triage Notes (Signed)
Pt states that she tried cleaning her ear and the end piece of the q tip is stuck in her ear. Pt denies any pain or discomfort.   Also patient came on her O2 machine but her O2 was 86, patient denies any SOB or dizzines. Pt states that normally when she uses her oxygen machine on the go her oxygen drops. Pt was transferred to UC O2 tank and O2 came up to 95 on three liters.

## 2020-06-26 NOTE — ED Provider Notes (Signed)
[facility]    CSN: [id number] Arrival date & time: 06/26/20  1615      History   Chief Complaint No chief complaint on file.   HPI [name] is a 80 y.o. female.   Patient presenting today with her daughter for evaluation of a foreign body in her left ear.  She states she was trying to clean her ear out with a Q-tip earlier today and a little bit of the cotton remained in her ear.  No pain, discharge, headache, muffled hearing.  She has not tried anything to get it out herself.     Past Medical History:  Diagnosis Date  . Allergy   . Arthritis   . Carotid artery occlusion   . GERD (gastroesophageal reflux disease)   . Hypertension   . Joint pain   . Thyroid disease     Patient Active Problem List   Diagnosis Date Noted  . COVID-19 05/01/2020  . Pulmonary hypertension ([place]) 01/15/2020  . Hypoxemic respiratory failure, chronic ([place]) 01/15/2020  . Lung nodule, multiple 01/15/2020  . 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 SOB (shortness of breath) 12/26/2019  . Acute on chronic respiratory failure with hypoxia ([place]) 12/25/2019  . Acute respiratory failure with hypoxemia ([place]) 12/25/2019  . Essential hypertension 06/06/2017  . Hyperlipidemia 06/06/2017  . Leg cramps 06/06/2017  . Vertigo 06/06/2017  . Macular degeneration 06/06/2017  . Hypothyroidism 06/06/2017  . Carotid stenosis 01/22/2014  . Aftercare following surgery of the circulatory system, [place] 01/02/2013  . Occlusion and stenosis of carotid artery without mention of cerebral infarction 04/27/2011    Past Surgical History:  Procedure Laterality Date  . ABDOMINAL HYSTERECTOMY    . AORTIC ARCH ANGIOGRAPHY N/A 03/01/2017   Procedure: AORTIC ARCH ANGIOGRAPHY;  Surgeon: Serafina [name], MD;  Location: [facility] CV LAB;  Service: Cardiovascular;  Laterality: N/A;  . ARCH AORTOGRAM N/A 05/04/2011   Procedure: ARCH AORTOGRAM;  Surgeon: Serafina [name], MD;  Location: [facility] CATH LAB;   Service: Cardiovascular;  Laterality: N/A;  . ARCH AORTOGRAM N/A 07/04/2012   Procedure: ARCH AORTOGRAM;  Surgeon: Serafina [name], MD;  Location: [facility] CATH LAB;  Service: Cardiovascular;  Laterality: N/A;  . CAROTID ANGIOGRAM  July 04, 2012  . CAROTID ANGIOGRAM N/A 05/04/2011   Procedure: CAROTID ANGIOGRAM;  Surgeon: Serafina [name], MD;  Location: [facility] CATH LAB;  Service: Cardiovascular;  Laterality: N/A;  . CAROTID ANGIOGRAM N/A 07/04/2012   Procedure: CAROTID ANGIOGRAM;  Surgeon: Serafina [name], MD;  Location: [facility] CATH LAB;  Service: Cardiovascular;  Laterality: N/A;  . CAROTID STENT INSERTION Right 05/11/2011   Procedure: CAROTID STENT INSERTION;  Surgeon: Serafina [name], MD;  Location: [facility] CATH LAB;  Service: Cardiovascular;  Laterality: Right;  . carotid subclavian anastomosis  05/11/11   Right subclav. artery transected 12/17/09  . CATARACT EXTRACTION     bilateral  . EYE SURGERY      OB History   No obstetric history on file.      Home Medications    Prior to Admission medications   Medication Sig Start Date End Date Taking? Authorizing Provider  acetaminophen (TYLENOL) 325 MG tablet Take 650 mg every 6 (six) hours as needed by mouth for mild pain or headache.     [provider]  albuterol (VENTOLIN HFA) 108 (90 Base) MCG/ACT inhaler TAKE 2 PUFFS BY MOUTH EVERY 6 HOURS AS NEEDED FOR WHEEZE OR SHORTNESS OF BREATH Patient taking differently: Inhale 2  puffs into the lungs every 6 (six) hours as needed for wheezing or shortness of breath. 02/27/20   [name], MD  amLODipine (NORVASC) 5 MG tablet TAKE 1 TABLET BY MOUTH EVERY DAY Patient taking differently: Take 5 mg by mouth daily. 04/21/20   [name], MD  aspirin 81 MG chewable tablet Chew 81 mg by mouth daily.    [provider]  calcium-vitamin D (OSCAL WITH D) 500-200 MG-UNIT per tablet Take 1 tablet by mouth daily with breakfast.    [provider]  cetirizine (ZYRTEC) 10 MG tablet Take  10 mg daily as needed by mouth for allergies.     [provider]  clopidogrel (PLAVIX) 75 MG tablet TAKE 1 TABLET BY MOUTH EVERY DAY Patient taking differently: Take 75 mg by mouth daily. 03/27/20   [name], MD  diazepam (VALIUM) 2 MG tablet Take 0.5 tablets (1 mg total) by mouth every 12 (twelve) hours as needed (vertigo). 01/15/20   [name], MD  diclofenac Sodium (VOLTAREN) 1 % GEL Apply 2 g topically 4 (four) times daily. 11/25/19   Darr, Edison Nasuti, PA-C  docusate sodium ([employer]S STOOL SOFTENER) 100 MG capsule TAKE 1 CAPSULE (100 MG TOTAL) BY MOUTH DAILY AS NEEDED FOR MILD CONSTIPATION. Patient taking differently: Take 100 mg by mouth daily as needed for mild constipation. TAKE 1 CAPSULE (100 MG TOTAL) BY MOUTH DAILY AS NEEDED FOR MILD CONSTIPATION. 01/15/20   [name], MD  fluticasone (FLONASE) 50 MCG/ACT nasal spray Place 1 spray into both nostrils daily as needed for allergies or rhinitis (for sinus congestion). 01/15/20   [name], MD  gabapentin (NEURONTIN) 100 MG capsule TAKE 1 CAPSULE (100 MG TOTAL) BY MOUTH AT BEDTIME. 05/30/19   [name], MD  guaiFENesin-dextromethorphan (ROBITUSSIN DM) 100-10 MG/5ML syrup Take 10 mLs by mouth every 4 (four) hours as needed for cough. 05/04/20   Gherghe, Vella Redhead, MD  latanoprost (XALATAN) 0.005 % ophthalmic solution Place 1 drop at bedtime into both eyes. 01/15/17   [provider]  levothyroxine (SYNTHROID) 50 MCG tablet TAKE 1 TABLET BY MOUTH EVERY DAY Patient taking differently: Take 50 mcg by mouth daily before breakfast. 01/30/20   [name], MD  lisinopril (ZESTRIL) 20 MG tablet TAKE 1 TABLET BY MOUTH EVERY DAY 05/05/20   [name], MD  metoprolol succinate (TOPROL-XL) 25 MG 24 hr tablet TAKE 1 TABLET BY MOUTH EVERY DAY Patient taking differently: Take 25 mg by mouth daily. 09/24/19   [name], MD  Multiple Vitamins-Minerals (ALIVE WOMENS ENERGY) TABS Take 1 tablet by  mouth daily.    [provider]  Probiotic Product (PROBIOTIC PO) Take 1 capsule by mouth daily.    [provider]  simvastatin (ZOCOR) 20 MG tablet TAKE 1 TABLET BY MOUTH EVERYDAY AT BEDTIME Patient taking differently: Take 20 mg by mouth at bedtime. 11/23/19   [name], MD  SYSTANE ULTRA 0.4-0.3 % SOLN Place 1 drop into both eyes daily as needed (dry eyes). 10/30/19   [provider]    Family History Family History  Problem Relation Age of Onset  . Heart failure Mother   . Cancer Father   . Breast cancer Neg Hx     Social History Social History   Tobacco Use  . Smoking status: Former Smoker    Packs/day: 0.30    Years: 5.00    Pack years: 1.50    Types: Cigarettes  Quit date: 04/12/1980    Years since quitting: 40.2  . Smokeless tobacco: Never Used  Vaping Use  . Vaping Use: Never used  Substance Use Topics  . Alcohol use: No  . Drug use: No     Allergies   Meclizine   Review of Systems Review of Systems Per HPI  Physical Exam Triage Vital Signs ED Triage Vitals  Enc Vitals Group     BP 06/26/20 1642 131/61     Pulse Rate 06/26/20 1642 90     Resp 06/26/20 1642 17     Temp 06/26/20 1642 (!) 97.2 F (36.2 C)     Temp Source 06/26/20 1642 Oral     SpO2 06/26/20 1642 95 %     Weight --      Height --      Head Circumference --      Peak Flow --      Pain Score 06/26/20 1646 0     Pain Loc --      Pain Edu? --      Excl. in [place]? --    No data found.  Updated Vital Signs BP 131/61 (BP Location: Right Arm)   Pulse 90   Temp (!) 97.2 F (36.2 C) (Oral)   Resp 17   SpO2 95%   Visual Acuity Right Eye Distance:   Left Eye Distance:   Bilateral Distance:    Right Eye Near:   Left Eye Near:    Bilateral Near:     Physical Exam Vitals and nursing note reviewed.  Constitutional:      Appearance: Normal appearance. She is not ill-appearing.  HENT:     Head: Atraumatic.     Ears:     Comments: Right EAC  benign Left EAC with small amount of cerumen and portion of cotton from Q-tip pressed against membrane    Nose: Nose normal.     Mouth/Throat:     Mouth: Mucous membranes are moist.     Pharynx: Oropharynx is clear.  Eyes:     Extraocular Movements: Extraocular movements intact.     Conjunctiva/sclera: Conjunctivae normal.  Cardiovascular:     Rate and Rhythm: Normal rate and regular rhythm.     Heart sounds: Normal heart sounds.  Pulmonary:     Effort: Pulmonary effort is normal.     Breath sounds: Normal breath sounds.  Musculoskeletal:        General: Normal range of motion.     Cervical back: Normal range of motion and neck supple.  Skin:    General: Skin is warm and dry.  Neurological:     Mental Status: She is alert and oriented to person, place, and time.  Psychiatric:        Mood and Affect: Mood normal.        Thought Content: Thought content normal.        Judgment: Judgment normal.      UC Treatments / Results  Labs (all labs ordered are listed, but only abnormal results are displayed) Labs Reviewed - No data to display  EKG   Radiology No results found.  Procedures Procedures (including critical care time)  Medications Ordered in UC Medications - No data to display  Initial Impression / Assessment and Plan / UC Course  I have reviewed the triage vital signs and the nursing notes.  Pertinent labs & imaging results that were available during my care of the patient were reviewed by me and considered in  my medical decision making (see chart for details).     Q-tip removed fairly easily with ear lavage.  Patient tolerated procedure very well.  TM visualized fully and benign left ear.  Final Clinical Impressions(s) / UC Diagnoses   Final diagnoses:  Foreign body of left ear, initial encounter   Discharge Instructions   None    ED Prescriptions    None     PDMP not reviewed this encounter.   Volney American, [place] 06/26/20 1814

## 2020-07-07 ENCOUNTER — Encounter: Payer: Self-pay | Admitting: Internal Medicine

## 2020-07-07 ENCOUNTER — Ambulatory Visit: Payer: Medicare Other | Attending: Internal Medicine | Admitting: Internal Medicine

## 2020-07-07 VITALS — BP 111/55 | HR 73

## 2020-07-07 DIAGNOSIS — I272 Pulmonary hypertension, unspecified: Secondary | ICD-10-CM | POA: Diagnosis not present

## 2020-07-07 DIAGNOSIS — Z9981 Dependence on supplemental oxygen: Secondary | ICD-10-CM | POA: Diagnosis not present

## 2020-07-07 DIAGNOSIS — R0689 Other abnormalities of breathing: Secondary | ICD-10-CM

## 2020-07-07 DIAGNOSIS — R0600 Dyspnea, unspecified: Secondary | ICD-10-CM | POA: Diagnosis not present

## 2020-07-07 DIAGNOSIS — E039 Hypothyroidism, unspecified: Secondary | ICD-10-CM

## 2020-07-07 DIAGNOSIS — J9611 Chronic respiratory failure with hypoxia: Secondary | ICD-10-CM

## 2020-07-07 MED ORDER — PREDNISONE 20 MG PO TABS: ORAL_TABLET | ORAL | 0 refills | Status: DC

## 2020-07-07 MED ORDER — FLUTICASONE-SALMETEROL 115-21 MCG/ACT IN AERO: 2.0000 | INHALATION_SPRAY | Freq: Two times a day (BID) | RESPIRATORY_TRACT | 4 refills | Status: DC

## 2020-07-07 NOTE — Progress Notes (Signed)
Virtual Visit via Telephone Note  I connected with [name] on 07/07/20 at 2:34 p.m by telephone and verified that I am speaking with the correct person using two identifiers.  Location: Patient: home Provider: office Participants: Myself Patient and her daughter.   I discussed the limitations, risks, security and privacy concerns of performing an evaluation and management service by telephone and the availability of in person appointments. I also discussed with the patient that there may be a patient responsible charge related to this service. The patient expressed understanding and agreed to proceed.   History of Present Illness: HTN, hypothyroid, HL, chronic hypoxic respiratory failure on 3 L of O2, pulHTN(negative VQ scan, serology negative for CTD, sleep study negative for OSA, PFTs incomplete as pt unable to follow instructions to complete.   Followed by pulmonary 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  PulHTN- c/o breathing more labor when temp is cool in a.m and evenings. O2 levels noted to be lower when she is sitting up compared to when she is laying down.  Sleeps well. O2 levels 95-96 on 5 Liter during the night but sleeps with a surgical mask on O2 level 91-92 at rest on 4 liters, O2 level drops to low 80s when she is up moving around on 5 liters.  This has been the case since hosp 04/2020.  Did better when she was on steroids from hosp discgh.  Uses Albuterol PRN -Patient has portable O2 to use outside the house.  She feels that the oxygen flow comes on and off with the portable device as compared to when she is on the oxygen concentrator at home.  Wants to know whether she needs to have oxygen tanks instead to take with her  when she is outside the house. Daughter also states that she has to use a wheelchair when she is outside the house but does not have one of her own.  Wanting to know whether she would qualify for 1. -endorses SOB with exertion -no increase cough.  No fever.  Occasional light nose bleed when she blows nose in a.m.  + rhinorrhea No LE edema Checks BP regularly.  BP was 111/55 and pulse 73 this a.m. compliant with taking amlodipine and lisinopril  Thyroid: Reports compliance with taking levothyroxine.   Outpatient Encounter Medications as of 07/07/2020  Medication Sig  . acetaminophen (TYLENOL) 325 MG tablet Take 650 mg every 6 (six) hours as needed by mouth for mild pain or headache.   . albuterol (VENTOLIN HFA) 108 (90 Base) MCG/ACT inhaler TAKE 2 PUFFS BY MOUTH EVERY 6 HOURS AS NEEDED FOR WHEEZE OR SHORTNESS OF BREATH (Patient taking differently: Inhale 2 puffs into the lungs every 6 (six) hours as needed for wheezing or shortness of breath.)  . amLODipine (NORVASC) 5 MG tablet TAKE 1 TABLET BY MOUTH EVERY DAY (Patient taking differently: Take 5 mg by mouth daily.)  . aspirin 81 MG chewable tablet Chew 81 mg by mouth daily.  . calcium-vitamin D (OSCAL WITH D) 500-200 MG-UNIT per tablet Take 1 tablet by mouth daily with breakfast.  . cetirizine (ZYRTEC) 10 MG tablet Take 10 mg daily as needed by mouth for allergies.   Marland Kitchen clopidogrel (PLAVIX) 75 MG tablet TAKE 1 TABLET BY MOUTH EVERY DAY (Patient taking differently: Take 75 mg by  mouth daily.)  . diazepam (VALIUM) 2 MG tablet Take 0.5 tablets (1 mg total) by mouth every 12 (twelve) hours as needed (vertigo).  Marland Kitchen diclofenac Sodium (VOLTAREN) 1 % GEL Apply 2 g topically 4 (four) times daily.  Marland Kitchen docusate sodium ([employer]S STOOL SOFTENER) 100 MG capsule TAKE 1 CAPSULE (100 MG TOTAL) BY MOUTH DAILY AS NEEDED FOR MILD CONSTIPATION. (Patient taking differently: Take 100 mg by mouth daily as needed for mild constipation. TAKE 1 CAPSULE (100 MG TOTAL) BY MOUTH  DAILY AS NEEDED FOR MILD CONSTIPATION.)  . fluticasone (FLONASE) 50 MCG/ACT nasal spray Place 1 spray into both nostrils daily as needed for allergies or rhinitis (for sinus congestion).  . gabapentin (NEURONTIN) 100 MG capsule TAKE 1 CAPSULE (100 MG TOTAL) BY MOUTH AT BEDTIME.  Marland Kitchen guaiFENesin-dextromethorphan (ROBITUSSIN DM) 100-10 MG/5ML syrup Take 10 mLs by mouth every 4 (four) hours as needed for cough.  . latanoprost (XALATAN) 0.005 % ophthalmic solution Place 1 drop at bedtime into both eyes.  Marland Kitchen levothyroxine (SYNTHROID) 50 MCG tablet TAKE 1 TABLET BY MOUTH EVERY DAY (Patient taking differently: Take 50 mcg by mouth daily before breakfast.)  . lisinopril (ZESTRIL) 20 MG tablet TAKE 1 TABLET BY MOUTH EVERY DAY  . metoprolol succinate (TOPROL-XL) 25 MG 24 hr tablet TAKE 1 TABLET BY MOUTH EVERY DAY (Patient taking differently: Take 25 mg by mouth daily.)  . Multiple Vitamins-Minerals (ALIVE WOMENS ENERGY) TABS Take 1 tablet by mouth daily.  . Probiotic Product (PROBIOTIC PO) Take 1 capsule by mouth daily.  . simvastatin (ZOCOR) 20 MG tablet TAKE 1 TABLET BY MOUTH EVERYDAY AT BEDTIME (Patient taking differently: Take 20 mg by mouth at bedtime.)  . SYSTANE ULTRA 0.4-0.3 % SOLN Place 1 drop into both eyes daily as needed (dry eyes).   No facility-administered encounter medications on file as of 07/07/2020.      Observations/Objective: This patient did not sound dyspneic and did not have any coughing  Assessment 1. Hypoxemic respiratory failure, chronic ([place]) 2. Pulmonary hypertension ([place]) -We will have her do a chest x-ray to evaluate for any new infiltrates or pneumothorax.  We will try to get her back in with pulmonary as soon as possible.  Message sent to [name].  In the meantime, we will have her follow-up with me within 2 weeks in person. -Start a trial of Advair inhaler. -Continue home O2.  Advised daughter to increase the oxygen level to be on 5 L if needed with ambulation.   However she states that the highest level it goes up to is 5 L.  I will have our caseworker check with her oxygen supplier about this.  Also informed that her portable oxygen should not be cutting off intermittently.  We will check with her supply on this. -We will refer to cardiology to rule out any cardiac causes of her pulmonary hypertension with this seems to be due more to chronic hypoxia. - DG Chest 2 View; Future - fluticasone-salmeterol (ADVAIR HFA) 115-21 MCG/ACT inhaler; Inhale 2 puffs into the lungs 2 (two) times daily.  Dispense: 1 each; Refill: 4 - DME Wheelchair electric - Ambulatory referral to Pulmonology  3. Dyspnea and respiratory abnormalities We will send a prescription to medical supply store for manual wheelchair so that she is able to get around in her house to perform activities of daily living given the shortness of breath with exertion. - DG Chest 2 View; Future - predniSONE (DELTASONE) 20 MG tablet; 1 tab PO daily x 4 days  then 1/2 tab daily x 4 days  Dispense: 6 tablet; Refill: 0 - fluticasone-salmeterol (ADVAIR HFA) 115-21 MCG/ACT inhaler; Inhale 2 puffs into the lungs 2 (two) times daily.  Dispense: 1 each; Refill: 4 - DME Wheelchair electric - Ambulatory referral to Pulmonology  4. Oxygen dependent Continue on 4 L at rest and 5 L with ambulation.  5. Hypothyroidism, acquired Continue levothyroxine  F/U  Within 2 wks in person.   I spent 23 minutes on this telephone encounter   Karle Plumber, MD

## 2020-07-08 ENCOUNTER — Telehealth: Payer: Self-pay | Admitting: Pulmonary Disease

## 2020-07-08 ENCOUNTER — Telehealth: Payer: Self-pay | Admitting: Internal Medicine

## 2020-07-08 NOTE — Telephone Encounter (Signed)
Called and spoke with patient. I explained that her PCP had reached out to [place] since she was having problems with her oxygen levels. She stated that she was currently doing fine. I attempted to get her scheduled with one of the NPs for this week but she stated that she only wanted to see AO and was ok with waiting until late April for her appt. She had called yesterday for an appt.   She is aware to call our office if she notices any breathing concerns.   Nothing further needed at time of call.

## 2020-07-08 NOTE — Telephone Encounter (Signed)
Tiffany form [employer]  Called to report that they do not have wheelchair with elevated leg rest and anti tempers in stock. Requesting to have this sent to another supplier.   Best contact: [phone]

## 2020-07-08 NOTE — Telephone Encounter (Signed)
Patients Dr reached out for [place] to get her an appointment , having problems with desaturations  Can we schedule for follow-up appointment soon,  kindly.

## 2020-07-09 ENCOUNTER — Telehealth: Payer: Self-pay

## 2020-07-09 ENCOUNTER — Other Ambulatory Visit: Payer: Self-pay

## 2020-07-09 ENCOUNTER — Ambulatory Visit (HOSPITAL_COMMUNITY)
Admission: RE | Admit: 2020-07-09 | Discharge: 2020-07-09 | Disposition: A | Discharge status: Home / Self Care | Payer: Medicare Other | Source: Ambulatory Visit | Attending: Internal Medicine | Admitting: Internal Medicine

## 2020-07-09 DIAGNOSIS — R0689 Other abnormalities of breathing: Secondary | ICD-10-CM | POA: Insufficient documentation

## 2020-07-09 DIAGNOSIS — R0600 Dyspnea, unspecified: Secondary | ICD-10-CM | POA: Diagnosis not present

## 2020-07-09 DIAGNOSIS — R0902 Hypoxemia: Secondary | ICD-10-CM | POA: Diagnosis not present

## 2020-07-09 DIAGNOSIS — J9611 Chronic respiratory failure with hypoxia: Secondary | ICD-10-CM | POA: Insufficient documentation

## 2020-07-09 NOTE — Progress Notes (Signed)
Let patient and daughter know that chest x-ray revealed changes consistent with chronic bronchitis.  I would like to see how she does on the new inhaler that I have prescribed for daily use called Advair.  Please tell her to have the pharmacist show her how to use the Advair inhaler when she picks it up.  Also I have sent a message to her pulmonary doctor and he states that they will try to get her in as soon as possible.

## 2020-07-09 NOTE — Telephone Encounter (Signed)
Call placed to patient's daughter, [name] to inquire about the O2.  She explained that her mother requires the O2 @ 5L. The POC has a pulse dose and does not accommodate what she needs.  She would like small tanks if [facility] is not able to provide a POC to meet patient's needs.  Informed her that this CM will check with [facility].

## 2020-07-10 ENCOUNTER — Telehealth: Payer: Self-pay

## 2020-07-10 NOTE — Telephone Encounter (Signed)
Call placed to [place], spoke to [facility] and informed her of daughter's concern regarding O2.  This CM spoke with her daughter yesterday.  Gwinda Passe said that the patient would not be able to have POC and tanks, insurance will not cover it.  The POC delivers a pulse dose and will not accommodate 5L Gwinda Passe said that she would call patient's daughter, [name] and discuss the options and will contact this CM if she needs orders for the change over to O2 tanks.   The patient has an appointment with pulmonary 08/04/2020.

## 2020-07-10 NOTE — Telephone Encounter (Signed)
Contacted pt to go over xray results pt is aware. Pt states she is doing much better. Pt states she is going to see the pulmonologist on the 25th of April. Pt doesn't have any questions or concerns

## 2020-07-21 ENCOUNTER — Telehealth: Payer: Self-pay | Admitting: Internal Medicine

## 2020-07-21 DIAGNOSIS — E039 Hypothyroidism, unspecified: Secondary | ICD-10-CM

## 2020-07-21 NOTE — Telephone Encounter (Signed)
Copied from [place] [id number]. Topic: General - Inquiry >> Jul 18, 2020  9:56 AM Greggory Keen D wrote: Reason for CRM: Pt's daughter called asking about getting 2 oxygen tanks and tubing for her mom and also about a  Wheelchair that they had ordered.  They would like to know the status.  CB# [phone] >> Jul 21, 2020  8:55 AM Lennox Solders wrote: Pt is calling checking on the status of message

## 2020-07-21 NOTE — Telephone Encounter (Signed)
Copied from [place] [id number]. Topic: Appointment Scheduling - Scheduling Inquiry for Clinic >> Jul 21, 2020  8:51 AM Lennox Solders wrote: Reason for CRM: Pt is calling and would like to know if she is due to have her tsh recheck

## 2020-07-22 NOTE — Telephone Encounter (Signed)
Could you please contact pt and schedule a lab appt

## 2020-07-22 NOTE — Telephone Encounter (Signed)
Will forward to provider

## 2020-07-23 NOTE — Telephone Encounter (Signed)
Spoke with Pt and she will come tomorrow morning. She has been added to the lab schedule.

## 2020-07-24 ENCOUNTER — Other Ambulatory Visit: Payer: Self-pay

## 2020-07-24 ENCOUNTER — Ambulatory Visit: Payer: Medicare Other | Attending: Internal Medicine

## 2020-07-24 DIAGNOSIS — E039 Hypothyroidism, unspecified: Secondary | ICD-10-CM

## 2020-07-25 LAB — TSH: TSH: 6.49 u[IU]/mL — ABNORMAL HIGH (ref 0.450–4.500)

## 2020-07-27 ENCOUNTER — Other Ambulatory Visit: Payer: Self-pay | Admitting: Internal Medicine

## 2020-07-27 DIAGNOSIS — I10 Essential (primary) hypertension: Secondary | ICD-10-CM

## 2020-07-27 NOTE — Telephone Encounter (Signed)
Requested Prescriptions  Pending Prescriptions Disp Refills  . amLODipine (NORVASC) 5 MG tablet [Pharmacy Med Name: AMLODIPINE BESYLATE 5 MG TAB] 90 tablet 0    Sig: TAKE 1 TABLET BY MOUTH EVERY DAY     Cardiovascular:  Calcium Channel Blockers Passed - 07/27/2020 12:10 AM      Passed - Last BP in normal range    BP Readings from Last 1 Encounters:  07/07/20 (!) 111/55         Passed - Valid encounter within last 6 months    Recent Outpatient Visits          2 weeks ago Hypoxemic respiratory failure, chronic ([place])   [place] [name], MD   2 months ago Hospital discharge follow-up   [place], MD   6 months ago Hospital discharge follow-up   [place], MD   10 months ago Essential hypertension   [place], MD   1 year ago Hypothyroidism, unspecified type   [place], MD      Future Appointments            In 1 week Laurin Coder, MD [facility]

## 2020-07-28 ENCOUNTER — Other Ambulatory Visit: Payer: Self-pay | Admitting: Internal Medicine

## 2020-07-28 DIAGNOSIS — E039 Hypothyroidism, unspecified: Secondary | ICD-10-CM

## 2020-07-28 MED ORDER — LEVOTHYROXINE SODIUM 50 MCG PO TABS: 75.0000 ug | ORAL_TABLET | Freq: Every day | ORAL | 1 refills | Status: AC

## 2020-07-28 NOTE — Progress Notes (Signed)
Let patient know that her thyroid level is slightly off.  Please confirm that she has been taking the Levothyroxine 50 mcg daily and has not been out of the medication.  If so, we will need to increase the dose to 50 mcg 1 1/2 tabs daily. Updated prescription to be sent to her pharmacy.  Please call her pharmacy and make them aware of the new rxn so that any refills remaining on the old one is canceled.

## 2020-08-01 ENCOUNTER — Telehealth: Payer: Self-pay | Admitting: *Deleted

## 2020-08-01 NOTE — Telephone Encounter (Signed)
spk to patient daughter about dose change with thyroid medication. She also had question regarding:  Wheelchair- informed wheelchair was not in stock per last message. Is there another office recommended to get the wheelchair.   Assistance with mother a few hours throughout the week.  Oxygen concentrator- Advised to f/u with Pulmonologist at appt. On Monday. May need to have Pulmonology f/u with new order of O2 dosage.   Please advise.

## 2020-08-04 ENCOUNTER — Ambulatory Visit (INDEPENDENT_AMBULATORY_CARE_PROVIDER_SITE_OTHER): Payer: Medicare Other | Admitting: Pulmonary Disease

## 2020-08-04 ENCOUNTER — Telehealth: Payer: Self-pay

## 2020-08-04 ENCOUNTER — Encounter: Payer: Self-pay | Admitting: Pulmonary Disease

## 2020-08-04 ENCOUNTER — Other Ambulatory Visit: Payer: Self-pay

## 2020-08-04 VITALS — BP 132/70 | HR 87 | Temp 98.0°F | Ht 62.0 in | Wt 130.0 lb

## 2020-08-04 DIAGNOSIS — R0602 Shortness of breath: Secondary | ICD-10-CM

## 2020-08-04 NOTE — Progress Notes (Signed)
_0  ID: [name], female    DOB: July 27, 1940, 80 y.o.   MRN: [id number]  Chief Complaint  Patient presents with  . Follow-up    Sob with activity. 5L. DME- [facility]    Referring provider: [name], MD  HPI: Was recently hospitalized for acute respiratory failure and found to have lung nodules  She does feel tight sometimes She uses albuterol as needed  She uses Advair and also albuterol as needed  Limited with activities of daily living  Difficulty with ambulating generally Requiring up to 5 L to keep saturations above 90 Not tolerating a portable oxygen Concentrator at present  Has been having some nasal stuffiness and congestion with nosebleeds with use of nasal steroids  She does not feel acutely ill She has a cough, does not really bringing up any significant secretions No fever, no chills  She has been requiring supplemental oxygen up to 5 L with activity  TEST/EVENTS :  CT chest December 25, 2019 spiculated 7 mm right upper lobe nodule, 5 mm pulmonary nodule posteriorly, micronodules along the right upper lobe.  Left upper lobe groundglass density  VQ scan December 27, 2019 low probability for PE  2D echo December 26, 2019 EF 4098%, RV systolic function normal.  RV size normal.  Moderately elevated pulmonary artery systolic pressure 60 mmHg.  Venous Doppler negative for DVT  She did have a sleep study a couple of nights ago that I reviewed not showing any significant sleep disordered breathing  10/26 follow up : PNA , O2 RF, pulmonary hypertension Patient presents for a post hospital follow-up.  Patient was recently seen in the hospital for pulmonary consult for acute respiratory distress, shortness of breath hypoxia and CT chest with scattered lung nodules.  She was treated for suspected pneumonia with antibiotics and steroids.  Started on oxygen at discharge at 3 L.  Patient CT chest showed scattered nodules in the right upper lobe and  left upper lobe.  Patient is a former smoker. Patient says she is retired.  She lives at home and is active.  She noticed around June of this year that she started having some worsening shortness of breath with activities.  Around August symptoms progressively worsened.  She has no significant cough or leg swelling.  No orthopnea. 2D echo during hospitalization showed pulmonary hypertension.. She does have some restless sleep and daytime sleepiness.  BMI is 22.  Patient is a former smoker. Autoimmune testing was negative except for sed rate was elevated at 46  weight is down approximately 10 pounds over the last year  Allergies  Allergen Reactions  . Meclizine Nausea And Vomiting     There is no immunization history on file for this patient.  Past Medical History:  Diagnosis Date  . Allergy   . Arthritis   . Carotid artery occlusion   . GERD (gastroesophageal reflux disease)   . Hypertension   . Joint pain   . Thyroid disease    Tobacco History: Social History   Tobacco Use  Smoking Status Former Smoker  . Packs/day: 0.30  . Years: 5.00  . Pack years: 1.50  . Types: Cigarettes  . Quit date: 04/12/1980  . Years since quitting: 40.3  Smokeless Tobacco Never Used   Counseling given: Not Answered  Outpatient Medications Prior to Visit  Medication Sig Dispense Refill  . acetaminophen (TYLENOL) 325 MG tablet Take 650 mg every 6 (six) hours as needed by mouth for mild pain or headache.     Marland Kitchen  albuterol (VENTOLIN HFA) 108 (90 Base) MCG/ACT inhaler TAKE 2 PUFFS BY MOUTH EVERY 6 HOURS AS NEEDED FOR WHEEZE OR SHORTNESS OF BREATH (Patient taking differently: Inhale 2 puffs into the lungs every 6 (six) hours as needed for wheezing or shortness of breath.) 18 each 2  . amLODipine (NORVASC) 5 MG tablet TAKE 1 TABLET BY MOUTH EVERY DAY 90 tablet 0  . aspirin 81 MG chewable tablet Chew 81 mg by mouth daily.    . calcium-vitamin D (OSCAL WITH D) 500-200 MG-UNIT per tablet Take 1 tablet by  mouth daily with breakfast.    . cetirizine (ZYRTEC) 10 MG tablet Take 10 mg daily as needed by mouth for allergies.     Marland Kitchen clopidogrel (PLAVIX) 75 MG tablet TAKE 1 TABLET BY MOUTH EVERY DAY (Patient taking differently: Take 75 mg by mouth daily.) 90 tablet 1  . diazepam (VALIUM) 2 MG tablet Take 0.5 tablets (1 mg total) by mouth every 12 (twelve) hours as needed (vertigo). 15 tablet 1  . diclofenac Sodium (VOLTAREN) 1 % GEL Apply 2 g topically 4 (four) times daily. 100 g 0  . docusate sodium ([employer]S STOOL SOFTENER) 100 MG capsule TAKE 1 CAPSULE (100 MG TOTAL) BY MOUTH DAILY AS NEEDED FOR MILD CONSTIPATION. (Patient taking differently: Take 100 mg by mouth daily as needed for mild constipation. TAKE 1 CAPSULE (100 MG TOTAL) BY MOUTH DAILY AS NEEDED FOR MILD CONSTIPATION.) 30 capsule 2  . fluticasone (FLONASE) 50 MCG/ACT nasal spray Place 1 spray into both nostrils daily as needed for allergies or rhinitis (for sinus congestion). 48 mL 3  . fluticasone-salmeterol (ADVAIR HFA) 115-21 MCG/ACT inhaler Inhale 2 puffs into the lungs 2 (two) times daily. 1 each 4  . gabapentin (NEURONTIN) 100 MG capsule TAKE 1 CAPSULE (100 MG TOTAL) BY MOUTH AT BEDTIME. 90 capsule 1  . guaiFENesin-dextromethorphan (ROBITUSSIN DM) 100-10 MG/5ML syrup Take 10 mLs by mouth every 4 (four) hours as needed for cough. 118 mL 0  . latanoprost (XALATAN) 0.005 % ophthalmic solution Place 1 drop at bedtime into both eyes.    Marland Kitchen levothyroxine (SYNTHROID) 50 MCG tablet Take 1.5 tablets (75 mcg total) by mouth daily. 45 tablet 1  . lisinopril (ZESTRIL) 20 MG tablet TAKE 1 TABLET BY MOUTH EVERY DAY 90 tablet 0  . metoprolol succinate (TOPROL-XL) 25 MG 24 hr tablet TAKE 1 TABLET BY MOUTH EVERY DAY (Patient taking differently: Take 25 mg by mouth daily.) 30 tablet 1  . Multiple Vitamins-Minerals (ALIVE WOMENS ENERGY) TABS Take 1 tablet by mouth daily.    . predniSONE (DELTASONE) 20 MG tablet 1 tab PO daily x 4 days then 1/2 tab daily x 4 days 6  tablet 0  . Probiotic Product (PROBIOTIC PO) Take 1 capsule by mouth daily.    . simvastatin (ZOCOR) 20 MG tablet TAKE 1 TABLET BY MOUTH EVERYDAY AT BEDTIME (Patient taking differently: Take 20 mg by mouth at bedtime.) 90 tablet 2  . SYSTANE ULTRA 0.4-0.3 % SOLN Place 1 drop into both eyes daily as needed (dry eyes).     No facility-administered medications prior to visit.     Review of Systems:  Cough, congestion Nosebleeds Physical deconditioning  Physical Exam  Elderly lady, does not appear to be in distress Wheelchair-bound S1-S2 appreciated Decreased air movement bilaterally  Lab Results:  CBC    Component Value Date/Time   WBC 8.6 05/04/2020 0333   RBC 4.11 05/04/2020 0333   HGB 13.1 05/04/2020 0333   HGB 13.3 01/15/2020  1217   HCT 39.9 05/04/2020 0333   HCT 41.9 01/15/2020 1217   PLT 223 05/04/2020 0333   PLT 224 01/15/2020 1217   MCV 97.1 05/04/2020 0333   MCV 94 01/15/2020 1217   MCH 31.9 05/04/2020 0333   MCHC 32.8 05/04/2020 0333   RDW 14.1 05/04/2020 0333   RDW 14.5 01/15/2020 1217   LYMPHSABS 2.1 05/04/2020 0333   MONOABS 0.5 05/04/2020 0333   EOSABS 0.0 05/04/2020 0333   BASOSABS 0.0 05/04/2020 0333    BMET    Component Value Date/Time   NA 138 05/04/2020 0333   NA 142 01/15/2020 1217   K 4.0 05/04/2020 0333   CL 107 05/04/2020 0333   CO2 21 (L) 05/04/2020 0333   GLUCOSE 123 (H) 05/04/2020 0333   BUN 31 (H) 05/04/2020 0333   BUN 10 01/15/2020 1217   CREATININE 1.07 (H) 05/04/2020 0333   CALCIUM 9.1 05/04/2020 0333   CALCIUM 7.3 (L) 04/18/2008 1710   GFRNONAA 52 (L) 05/04/2020 0333   GFRAA 86 01/15/2020 1217    BNP    Component Value Date/Time   BNP 80.9 05/01/2020 1232    ProBNP No results found for: PROBNP  Imaging:  CT chest reviewed with patient and her daughter  Assessment & Plan:  .  Patient with scattered lung nodules on CT  .  No PFT on record as patient was unable to perform PFT  Inhaler use technique reviewed .   Poor technique reviewed with daughter today again .  She will continue to use a spacer  Chronic respiratory failure .  Continue oxygen supplementation at 5 L especially with activity .  Goal is to keep saturations greater than 90% at all times  .  Graded exercise as tolerated  .  She should discontinue use of steroid nasal sprays as this is contributing to epistaxis  .   encouraged to call as needed  .  Requesting for wheelchair  Will place an order for wheelchair  Patient suffers from severe pulmonary hypertension which impairs their ability to perform daily activities like bathing, dressing, feeding, grooming and toileting in the home. A crutch or walker will not resolve issue with performing activities of daily living. A wheelchair will allow patient to safely perform daily activities. Patient can safely propel the wheelchair in the home or has a caregiver who can provide assistance. Accessories: elevating leg rests (ELRs), wheel locks, extensions and anti-tippers.   .  I spent 30 minutes dedicated to the care of this patient on the date of this encounter to include previsit review of records, face-to-face time with the patient discussing conditions above, post visit ordering of testing, clinical documentation with electronic health record and discussing necessary findings with members of the patient's care team  Laurin Coder, MD 08/04/2020

## 2020-08-04 NOTE — Patient Instructions (Signed)
We will send in a prescription to revert back to oxygen tank use  Oxygen supplementation at 5 L  Graded exercise as tolerated  We will initiate process for a wheelchair, as I explained to you, if we have any difficulty then we will defer to your primary doctor to help with this  I will see you back in 3 months  Continue using Advair and albuterol as we demonstrated in the office today  Call with significant concerns

## 2020-08-04 NOTE — Telephone Encounter (Signed)
Call placed to patient's daughter, [name] to discuss sending order for w/c to another DME company as well as discussing the option of PCS assistance for her mother. Message left with call back requested to this CM.

## 2020-08-05 ENCOUNTER — Telehealth: Payer: Self-pay | Admitting: Pulmonary Disease

## 2020-08-05 NOTE — Telephone Encounter (Signed)
Spoke with pt, states she is requesting a power wheelchair and not a standard wheelchair. Order was placed 4/25 to [facility] for a standard wheelchair.  AO please advise if ok to place order for power wheelchair instead. Thanks!

## 2020-08-06 NOTE — Telephone Encounter (Signed)
Call placed again to patient's daughter, [name].  Regarding the wheelchair, she said that the pulmonologist was placing an order for a manual wheelchair . The patient stated that she wanted a power chair but [name] does not think that is necessary and she has concerns about her mother being able to use it correctly.   Regarding help at home. She is requesting assistance with personal care and meal preparation for her mother.  This CM explained that the focus of the visit needs to be on personal care. [name] said that she understood and that the patient has a caregiver in mind. This CM explained that PCS through [employer] does not allow patients to hire their own caregivers.  That is CAP services and there is a wait for CAP services if her mother qualifies. This CM provided her with the phone number to call CAP and inquire about the wait list.  In the meantime, she was in agreement to placing a referral for PCS and she said that she will discuss this plan with her mother.  If she qualifies for assistance there has not been a wait for services.  Explained to her that Dr Wynetta Emery is out of the office this week but this information will be shared with her when she returns to the clinic next week.

## 2020-08-07 ENCOUNTER — Encounter: Payer: Self-pay | Admitting: Pulmonary Disease

## 2020-08-07 NOTE — Telephone Encounter (Signed)
Yes  Okay for power wheelchair order

## 2020-08-08 NOTE — Telephone Encounter (Signed)
Per current wheelchair order it appears that a power wheelchair is being ordered and processed. Nothing further needed at this time- will close encounter.

## 2020-08-11 ENCOUNTER — Other Ambulatory Visit: Payer: Self-pay | Admitting: Internal Medicine

## 2020-08-11 DIAGNOSIS — I6529 Occlusion and stenosis of unspecified carotid artery: Secondary | ICD-10-CM

## 2020-08-11 NOTE — Telephone Encounter (Signed)
Requested medication (s) are due for refill today:  yes   Requested medication (s) are on the active medication list: yes   Last refill:  06/21/2020  Future visit scheduled: no  Notes to clinic:  Patient was due for a 2 week follow up from 07/07/2020 tele visit    Requested Prescriptions  Pending Prescriptions Disp Refills   albuterol (VENTOLIN HFA) 108 (90 Base) MCG/ACT inhaler [Pharmacy Med Name: ALBUTEROL HFA (VENTOLIN) INH] 18 each 2    Sig: TAKE 2 PUFFS BY MOUTH EVERY 6 HOURS AS NEEDED FOR WHEEZE OR SHORTNESS OF BREATH      Pulmonology:  Beta Agonists Failed - 08/11/2020  6:33 AM      Failed - One inhaler should last at least one month. If the patient is requesting refills earlier, contact the patient to check for uncontrolled symptoms.      Passed - Valid encounter within last 12 months    Recent Outpatient Visits           1 month ago Hypoxemic respiratory failure, chronic ([place])   [place] [name], MD   3 months ago Hospital discharge follow-up   [place] [name], MD   6 months ago Hospital discharge follow-up   [place] [name], MD   11 months ago Essential hypertension   [place] [name], MD   1 year ago Hypothyroidism, unspecified type   [place] [name], MD                  clopidogrel (PLAVIX) 75 MG tablet [Pharmacy Med Name: CLOPIDOGREL 75 MG TABLET] 90 tablet 1    Sig: TAKE 1 TABLET BY [place]      Hematology: Antiplatelets - clopidogrel Failed - 08/11/2020  6:33 AM      Failed - Evaluate AST, ALT within 2 months of therapy initiation.      Passed - ALT in normal range and within 360 days    ALT  Date Value Ref Range Status  05/04/2020 21 0 - 44 U/L Final          Passed - AST in normal range and within 360 days    AST  Date  Value Ref Range Status  05/04/2020 25 15 - 41 U/L Final          Passed - HCT in normal range and within 180 days    HCT  Date Value Ref Range Status  05/04/2020 39.9 36.0 - 46.0 % Final   Hematocrit  Date Value Ref Range Status  01/15/2020 41.9 34.0 - 46.6 % Final          Passed - HGB in normal range and within 180 days    Hemoglobin  Date Value Ref Range Status  05/04/2020 13.1 12.0 - 15.0 g/dL Final  01/15/2020 13.3 11.1 - 15.9 g/dL Final          Passed - PLT in normal range and within 180 days    Platelets  Date Value Ref Range Status  05/04/2020 223 150 - 400 K/uL Final  01/15/2020 224 150 - 450 x10E3/uL Final          Passed - Valid encounter within last 6 months    Recent Outpatient Visits           1 month ago Hypoxemic  respiratory failure, chronic [facility])   [place] [name], MD   3 months ago Hospital discharge follow-up   [place], MD   6 months ago Hospital discharge follow-up   [place] [name], MD   11 months ago Essential hypertension   [place] [name], MD   1 year ago Hypothyroidism, unspecified type   [facility] [name], MD

## 2020-08-12 NOTE — Telephone Encounter (Signed)
Referral for [facility] faxed to [facility]

## 2020-08-13 ENCOUNTER — Telehealth: Payer: Self-pay | Admitting: Pulmonary Disease

## 2020-08-13 ENCOUNTER — Ambulatory Visit: Payer: Self-pay | Admitting: *Deleted

## 2020-08-13 NOTE — Telephone Encounter (Signed)
Called and spoke with pts daughter and she stated that the pt is having more SHOB with exertion.  Hard for her to take a bath without getting out of breath.  She is using her oxygen all the time but still has to rest when walking to the bathroom or through the house.  Daughter stated that her facial swelling has been going on for months.  She denies any cough or wheezing.  Pt is not in distress per pts daughter.   Appt scheduled with AO on 05/06 at 75  AO is off today.  SG  please advise on anything further for the pt to do until her OV?  Thanks

## 2020-08-13 NOTE — Telephone Encounter (Signed)
Mr. [name] daughter calling to report the patient continues to have SOB on exertion. This has been occurring for over 2 months. She is on 5L 02- 24 hours/day since November. Denies CP/dizziness/weakness/fever/vertigo. She is followed by [facility], recently seen on 08/04/20. Using inhalers as prescribed. Requesting more information on her diagnosis and on future treatments that may help her breathing. Daughter encouraged to speak with nurse at pulmonology. Patient is not wanting appointment with pcp-just answers.    Answer Assessment - Initial Assessment Questions 1. RESPIRATORY STATUS: "Describe your breathing?" (e.g., wheezing, shortness of breath, unable to speak, severe coughing)      Shortness of breath 2. ONSET: "When did this breathing problem begin?"      2 months 3. PATTERN "Does the difficult breathing come and go, or has it been constant since it started?"      constant 4. SEVERITY: "How bad is your breathing?" (e.g., mild, moderate, severe)    - MILD: No SOB at rest, mild SOB with walking, speaks normally in sentences, can lay down, no retractions, pulse < 100.    - MODERATE: SOB at rest, SOB with minimal exertion and prefers to sit, cannot lie down flat, speaks in phrases, mild retractions, audible wheezing, pulse 100-120.    - SEVERE: Very SOB at rest, speaks in single words, struggling to breathe, sitting hunched forward, retractions, pulse > 120      moderate 5. RECURRENT SYMPTOM: "Have you had difficulty breathing before?" If Yes, ask: "When was the last time?" and "What happened that time?"      no 6. CARDIAC HISTORY: "Do you have any history of heart disease?" (e.g., heart attack, angina, bypass surgery, angioplasty)      Yes 7. LUNG HISTORY: "Do you have any history of lung disease?"  (e.g., pulmonary embolus, asthma, emphysema)     Yes 8. CAUSE: "What do you think is causing the breathing problem?"      bronchitis 9. OTHER SYMPTOMS: "Do you have any other  symptoms? (e.g., dizziness, runny nose, cough, chest pain, fever)     non 10. PREGNANCY: "Is there any chance you are pregnant?" "When was your last menstrual period?"       na 11. TRAVEL: "Have you traveled out of the country in the last month?" (e.g., travel history, exposures)      na  Protocols used: BREATHING DIFFICULTY-A-AH

## 2020-08-13 NOTE — Telephone Encounter (Signed)
FYI   Pt has an appt with pulmonary on 5/6

## 2020-08-13 NOTE — Telephone Encounter (Signed)
No appts this month

## 2020-08-13 NOTE — Telephone Encounter (Signed)
I have called [name], the pts daughter and she is aware of SG recs.  I did tell her to keep a close eye on her and if her breathing gets worse to seek emergency care.  She stated that they are watching her SAT'S and they are around 95% with the oxygen.  She voiced her understanding of seeking the emergency care if needed but will keep the appt with AO on 05/06

## 2020-08-15 ENCOUNTER — Encounter: Payer: Self-pay | Admitting: Pulmonary Disease

## 2020-08-15 ENCOUNTER — Ambulatory Visit (INDEPENDENT_AMBULATORY_CARE_PROVIDER_SITE_OTHER): Payer: Medicare Other | Admitting: Pulmonary Disease

## 2020-08-15 ENCOUNTER — Other Ambulatory Visit: Payer: Self-pay

## 2020-08-15 VITALS — BP 114/56 | HR 86 | Ht 62.0 in | Wt 129.6 lb

## 2020-08-15 DIAGNOSIS — J9611 Chronic respiratory failure with hypoxia: Secondary | ICD-10-CM

## 2020-08-15 DIAGNOSIS — I270 Primary pulmonary hypertension: Secondary | ICD-10-CM

## 2020-08-15 MED ORDER — ARFORMOTEROL TARTRATE 15 MCG/2ML IN NEBU: 15.0000 ug | INHALATION_SOLUTION | Freq: Two times a day (BID) | RESPIRATORY_TRACT | 11 refills | Status: AC

## 2020-08-15 MED ORDER — BUDESONIDE 0.5 MG/2ML IN SUSP: 0.5000 mg | Freq: Two times a day (BID) | RESPIRATORY_TRACT | 11 refills | Status: AC

## 2020-08-15 MED ORDER — ALBUTEROL SULFATE (2.5 MG/3ML) 0.083% IN NEBU: 2.5000 mg | INHALATION_SOLUTION | Freq: Four times a day (QID) | RESPIRATORY_TRACT | 11 refills | Status: AC | PRN

## 2020-08-15 NOTE — Progress Notes (Signed)
_0  ID: [name], female    DOB: [birth year], 80 y.o.   MRN: [id number]  Chief Complaint  Patient presents with  . Acute Visit    Last seen 08/04/20. Presents with increased SOB     Referring provider: [name], MD  HPI: Was recently hospitalized for acute respiratory failure and found to have lung nodules  Still with significant shortness of breath She was accompanied by her daughter today She feels she is not able to use the inhaler as well, just not generating enough of a negative force to inhale well  Discussed the possibility of starting on a nebulizer treatment  Oxygen saturations have been okay since her last visit  She uses Advair and also albuterol as needed  Limited with activities of daily living  Difficulty with ambulating generally Requiring up to 5 L to keep saturations above 90  Has been having some nasal stuffiness and congestion with nosebleeds with use of nasal steroids  She does not feel acutely ill She has a cough, does not really bringing up any significant secretions No fever, no chills  She has been requiring supplemental oxygen up to 5 L with activity  TEST/EVENTS :  CT chest December 25, 2019 spiculated 7 mm right upper lobe nodule, 5 mm pulmonary nodule posteriorly, micronodules along the right upper lobe.  Left upper lobe groundglass density  VQ scan December 27, 2019 low probability for PE  2D echo December 26, 2019 EF 7517%, RV systolic function normal.  RV size normal.  Moderately elevated pulmonary artery systolic pressure 60 mmHg.  Venous Doppler negative for DVT  She did have a sleep study a couple of nights ago that I reviewed not showing any significant sleep disordered breathing  10/26 follow up : PNA , O2 RF, pulmonary hypertension Patient presents for a post hospital follow-up.  Patient was recently seen in the hospital for pulmonary consult for acute respiratory distress, shortness of breath hypoxia and  CT chest with scattered lung nodules.  She was treated for suspected pneumonia with antibiotics and steroids.  Started on oxygen at discharge at 3 L.  Patient CT chest showed scattered nodules in the right upper lobe and left upper lobe.  Patient is a former smoker. Patient says she is retired.  She lives at home and is active.  She noticed around June of this year that she started having some worsening shortness of breath with activities.  Around August symptoms progressively worsened.  She has no significant cough or leg swelling.  No orthopnea. 2D echo during hospitalization showed pulmonary hypertension.. She does have some restless sleep and daytime sleepiness.  BMI is 22.  Patient is a former smoker. Autoimmune testing was negative except for sed rate was elevated at 46  weight is down approximately 10 pounds over the last year  Allergies  Allergen Reactions  . Meclizine Nausea And Vomiting     There is no immunization history on file for this patient.  Past Medical History:  Diagnosis Date  . Allergy   . Arthritis   . Carotid artery occlusion   . GERD (gastroesophageal reflux disease)   . Hypertension   . Joint pain   . Thyroid disease    Tobacco History: Social History   Tobacco Use  Smoking Status Former Smoker  . Packs/day: 0.30  . Years: 5.00  . Pack years: 1.50  . Types: Cigarettes  . Quit date: 04/12/1980  . Years since quitting: 40.3  Smokeless Tobacco Never  Used   Counseling given: Not Answered  Outpatient Medications Prior to Visit  Medication Sig Dispense Refill  . acetaminophen (TYLENOL) 325 MG tablet Take 650 mg every 6 (six) hours as needed by mouth for mild pain or headache.     . albuterol (VENTOLIN HFA) 108 (90 Base) MCG/ACT inhaler TAKE 2 PUFFS BY MOUTH EVERY 6 HOURS AS NEEDED FOR WHEEZE OR SHORTNESS OF BREATH 18 each 0  . amLODipine (NORVASC) 5 MG tablet TAKE 1 TABLET BY MOUTH EVERY DAY 90 tablet 0  . aspirin 81 MG chewable tablet Chew 81 mg by mouth  daily.    . calcium-vitamin D (OSCAL WITH D) 500-200 MG-UNIT per tablet Take 1 tablet by mouth daily with breakfast.    . cetirizine (ZYRTEC) 10 MG tablet Take 10 mg daily as needed by mouth for allergies.     Marland Kitchen clopidogrel (PLAVIX) 75 MG tablet TAKE 1 TABLET BY MOUTH EVERY DAY 30 tablet 0  . diazepam (VALIUM) 2 MG tablet Take 0.5 tablets (1 mg total) by mouth every 12 (twelve) hours as needed (vertigo). 15 tablet 1  . diclofenac Sodium (VOLTAREN) 1 % GEL Apply 2 g topically 4 (four) times daily. 100 g 0  . docusate sodium ([facility] STOOL SOFTENER) 100 MG capsule TAKE 1 CAPSULE (100 MG TOTAL) BY MOUTH DAILY AS NEEDED FOR MILD CONSTIPATION. (Patient taking differently: Take 100 mg by mouth daily as needed for mild constipation. TAKE 1 CAPSULE (100 MG TOTAL) BY MOUTH DAILY AS NEEDED FOR MILD CONSTIPATION.) 30 capsule 2  . fluticasone (FLONASE) 50 MCG/ACT nasal spray Place 1 spray into both nostrils daily as needed for allergies or rhinitis (for sinus congestion). 48 mL 3  . fluticasone-salmeterol (ADVAIR HFA) 115-21 MCG/ACT inhaler Inhale 2 puffs into the lungs 2 (two) times daily. 1 each 4  . gabapentin (NEURONTIN) 100 MG capsule TAKE 1 CAPSULE (100 MG TOTAL) BY MOUTH AT BEDTIME. 90 capsule 1  . guaiFENesin-dextromethorphan (ROBITUSSIN DM) 100-10 MG/5ML syrup Take 10 mLs by mouth every 4 (four) hours as needed for cough. 118 mL 0  . latanoprost (XALATAN) 0.005 % ophthalmic solution Place 1 drop at bedtime into both eyes.    Marland Kitchen levothyroxine (SYNTHROID) 50 MCG tablet Take 1.5 tablets (75 mcg total) by mouth daily. 45 tablet 1  . lisinopril (ZESTRIL) 20 MG tablet TAKE 1 TABLET BY MOUTH EVERY DAY 90 tablet 0  . metoprolol succinate (TOPROL-XL) 25 MG 24 hr tablet TAKE 1 TABLET BY MOUTH EVERY DAY (Patient taking differently: Take 25 mg by mouth daily.) 30 tablet 1  . Multiple Vitamins-Minerals (ALIVE WOMENS ENERGY) TABS Take 1 tablet by mouth daily.    . predniSONE (DELTASONE) 20 MG tablet 1 tab PO daily x 4  days then 1/2 tab daily x 4 days 6 tablet 0  . Probiotic Product (PROBIOTIC PO) Take 1 capsule by mouth daily.    . simvastatin (ZOCOR) 20 MG tablet TAKE 1 TABLET BY MOUTH EVERYDAY AT BEDTIME (Patient taking differently: Take 20 mg by mouth at bedtime.) 90 tablet 2  . SYSTANE ULTRA 0.4-0.3 % SOLN Place 1 drop into both eyes daily as needed (dry eyes).     No facility-administered medications prior to visit.     Review of Systems:  Shortness of breath Congestion Shortness of breath with activity Denies any chest pains or chest discomfort   Physical Exam  Elderly lady, does not appear to be in distress, wheelchair borne moist oral mucosa S1-S2 appreciated with no murmur Clear breath sounds  bilaterally No significant peripheral edema  Lab Results:  CBC    Component Value Date/Time   WBC 8.6 05/04/2020 0333   RBC 4.11 05/04/2020 0333   HGB 13.1 05/04/2020 0333   HGB 13.3 01/15/2020 1217   HCT 39.9 05/04/2020 0333   HCT 41.9 01/15/2020 1217   PLT 223 05/04/2020 0333   PLT 224 01/15/2020 1217   MCV 97.1 05/04/2020 0333   MCV 94 01/15/2020 1217   MCH 31.9 05/04/2020 0333   MCHC 32.8 05/04/2020 0333   RDW 14.1 05/04/2020 0333   RDW 14.5 01/15/2020 1217   LYMPHSABS 2.1 05/04/2020 0333   MONOABS 0.5 05/04/2020 0333   EOSABS 0.0 05/04/2020 0333   BASOSABS 0.0 05/04/2020 0333    BMET    Component Value Date/Time   NA 138 05/04/2020 0333   NA 142 01/15/2020 1217   K 4.0 05/04/2020 0333   CL 107 05/04/2020 0333   CO2 21 (L) 05/04/2020 0333   GLUCOSE 123 (H) 05/04/2020 0333   BUN 31 (H) 05/04/2020 0333   BUN 10 01/15/2020 1217   CREATININE 1.07 (H) 05/04/2020 0333   CALCIUM 9.1 05/04/2020 0333   CALCIUM 7.3 (L) 04/18/2008 1710   GFRNONAA 52 (L) 05/04/2020 0333   GFRAA 86 01/15/2020 1217    BNP    Component Value Date/Time   BNP 80.9 05/01/2020 1232   Imaging:  CT chest reviewed with patient and her daughter  Assessment & Plan:  .  Patient with scattered  lung nodules on CT  .  No PFT on record as patient was unable to perform PFT  Inhaler use technique reviewed .  Inhaler technique is for .  We will continue to use a spacer with MDI  Chronic respiratory failure .  Continue oxygen supplementation .  Maintain saturations greater than 90%  .  Graded exercise as tolerated  .  Requesting for wheelchair-order placed .  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 A crutch or walker will not resolve issue with performing activities of daily living. A wheelchair will allow patient to safely perform daily activities. Patient can safely propel the wheelchair in the home or has a caregiver who can provide assistance. Accessories: elevating leg rests (ELRs), wheel locks, extensions and anti-tippers.   I spent 30 minutes dedicated to the care of this patient on the date of this encounter to include previsit review of records, face-to-face time with the patient discussing conditions above, post visit ordering of testing, clinical documentation with electronic health record and communicated necessary findings to members of the patient's care team  Laurin Coder, MD 08/15/2020

## 2020-08-15 NOTE — Patient Instructions (Signed)
Stop using Advair  We will start a steroid treatment via the nebulizer Long-acting albuterol via the nebulizer  Both of this will take the place of Advair, use twice a day regularly  Albuterol can be used via the nebulizer up to 4 times a day as needed-this means, you do not need to use it if you are not feeling unusually short of breath  Still keep the albuterol puffer to use whenever you are out and about  Continue using your oxygen  Graded exercises as tolerated  Follow-up in about 4 weeks  We will place orders for wheelchair

## 2020-08-20 ENCOUNTER — Other Ambulatory Visit: Payer: Self-pay | Admitting: Internal Medicine

## 2020-08-20 ENCOUNTER — Telehealth: Payer: Self-pay | Admitting: Pulmonary Disease

## 2020-08-20 DIAGNOSIS — E785 Hyperlipidemia, unspecified: Secondary | ICD-10-CM

## 2020-08-20 NOTE — Telephone Encounter (Signed)
Requested Prescriptions  Pending Prescriptions Disp Refills  . simvastatin (ZOCOR) 20 MG tablet [Pharmacy Med Name: SIMVASTATIN 20 MG TABLET] 90 tablet 2    Sig: TAKE 1 TABLET BY MOUTH EVERYDAY AT BEDTIME     Cardiovascular:  Antilipid - Statins Passed - 08/20/2020  2:46 AM      Passed - Total Cholesterol in normal range and within 360 days    Cholesterol, Total  Date Value Ref Range Status  09/11/2019 145 100 - 199 mg/dL Final         Passed - LDL in normal range and within 360 days    LDL Chol Calc (NIH)  Date Value Ref Range Status  09/11/2019 74 0 - 99 mg/dL Final         Passed - HDL in normal range and within 360 days    HDL  Date Value Ref Range Status  09/11/2019 49 >39 mg/dL Final         Passed - Triglycerides in normal range and within 360 days    Triglycerides  Date Value Ref Range Status  05/01/2020 84 <150 mg/dL Final    Comment:    Performed at [facility] [place] [address], [place], [place] [address]         Passed - Patient is not pregnant      Passed - Valid encounter within last 12 months    Recent Outpatient Visits          1 month ago Hypoxemic respiratory failure, chronic ([place])   [place], MD   3 months ago Hospital discharge follow-up   [place], MD   7 months ago Hospital discharge follow-up   [place], MD   11 months ago Essential hypertension   [place], MD   1 year ago Hypothyroidism, unspecified type   [place], MD      Future Appointments            In 1 month Laurin Coder, MD [facility]

## 2020-08-20 NOTE — Telephone Encounter (Signed)
Called and spoke with [facility] that stated they need a diagnosis code for nebulizer medication that is between J41 - J70.9.    Dr Ander Slade please advise with a different diagnosis code that can be used for nebulizer medication.

## 2020-08-21 ENCOUNTER — Telehealth: Payer: Self-pay | Admitting: Internal Medicine

## 2020-08-21 ENCOUNTER — Other Ambulatory Visit: Payer: Self-pay

## 2020-08-21 DIAGNOSIS — E039 Hypothyroidism, unspecified: Secondary | ICD-10-CM

## 2020-08-21 MED ORDER — ALBUTEROL SULFATE HFA 108 (90 BASE) MCG/ACT IN AERS: INHALATION_SPRAY | RESPIRATORY_TRACT | 2 refills | Status: DC

## 2020-08-21 NOTE — Telephone Encounter (Signed)
Copied from [place] [id number]. Topic: General - Inquiry >> Aug 21, 2020  9:35 AM Greggory Keen D wrote: Reason for CRM: Pt's daughter [name] called saying moms rx for the synthroid needs to be for 75 mg and she wants that sent in to the pharmacy.  She also needs her albuterol inhaler.  [facility]

## 2020-08-21 NOTE — Telephone Encounter (Signed)
Contacted pt and made aware that Dr. Wynetta Emery wanted her to come back in a month to have TSH rechecked. Pt states she has Synthroid to last her. Pt states she doesn't need a refill on it. Made pt aware that I have sent her inhaler into the pharmacy. Pt doesn't have any questions or concerns

## 2020-08-22 ENCOUNTER — Other Ambulatory Visit: Payer: Self-pay | Admitting: Internal Medicine

## 2020-08-22 NOTE — Telephone Encounter (Signed)
Requested medications are due for refill today NO  Requested medications are on the active medication list yes  Last refill 5/2  Last visit 3/28  Future visit scheduled 5/16  Notes to clinic Asking for inhaler too soon, did not want to deny an inhaler, please assess for uncontrolled symptoms.

## 2020-08-22 NOTE — Telephone Encounter (Signed)
Spoke to [facility] with [place], who is requesting a new dx code for nebulizer machine. J code is needed.  I do not see a dx within patient's chart with J code.   Dr. Bridgette Habermann, please advise. Thanks

## 2020-08-23 ENCOUNTER — Other Ambulatory Visit: Payer: Self-pay | Admitting: Internal Medicine

## 2020-08-23 DIAGNOSIS — I10 Essential (primary) hypertension: Secondary | ICD-10-CM

## 2020-08-25 ENCOUNTER — Ambulatory Visit: Payer: Medicare Other | Attending: Internal Medicine

## 2020-08-25 ENCOUNTER — Other Ambulatory Visit: Payer: Self-pay

## 2020-08-25 DIAGNOSIS — E039 Hypothyroidism, unspecified: Secondary | ICD-10-CM | POA: Diagnosis not present

## 2020-08-26 ENCOUNTER — Telehealth: Payer: Self-pay

## 2020-08-26 ENCOUNTER — Ambulatory Visit: Payer: Self-pay

## 2020-08-26 ENCOUNTER — Other Ambulatory Visit: Payer: Self-pay | Admitting: Internal Medicine

## 2020-08-26 ENCOUNTER — Telehealth: Payer: Self-pay | Admitting: Pulmonary Disease

## 2020-08-26 DIAGNOSIS — R0600 Dyspnea, unspecified: Secondary | ICD-10-CM

## 2020-08-26 LAB — TSH: TSH: 0.943 u[IU]/mL (ref 0.450–4.500)

## 2020-08-26 MED ORDER — AMOXICILLIN-POT CLAVULANATE 500-125 MG PO TABS: 1.0000 | ORAL_TABLET | Freq: Two times a day (BID) | ORAL | 0 refills | Status: DC

## 2020-08-26 MED ORDER — PREDNISONE 20 MG PO TABS: 20.0000 mg | ORAL_TABLET | Freq: Every day | ORAL | 0 refills | Status: DC

## 2020-08-26 NOTE — Telephone Encounter (Signed)
Called and spoke with pt to clarify if the Rx [name] was talking about were the neb sol and she said yes. Stated to her that we have sent message to Dr. Jenetta Downer to clarify a different Dx code we can use so that pt can be able to get her meds. Stated to her once we had this taken care of for her we would call her back to let her know.  There is already another encounter open about the meds so will close this encounter.

## 2020-08-26 NOTE — Telephone Encounter (Signed)
Contacted pt to go over lab results pt is aware and doesn't have any questions or concerns

## 2020-08-26 NOTE — Telephone Encounter (Signed)
[name] calling on behalf of pt stating that [facility] is requiring a new perscription that states [name] "works with her lungs." Pt is also having difficulty breathing whenever she stands up to move. Number listed is for pt [name]), if she does not answer please call [name] at [phone].

## 2020-08-26 NOTE — Telephone Encounter (Signed)
Will forward to provider  

## 2020-08-26 NOTE — Telephone Encounter (Signed)
Pt.'s daughter, [name] calling and asking to have Levothyroxine 75 mcg instead of 50 mcg be sent to pharmacy so she "won't have to cut them in half." Also states she thinks pt. Has bronchitis . Will call pt. For symptoms.

## 2020-08-26 NOTE — Telephone Encounter (Signed)
Pt. Reports she has had a cough x 2 months. Reports she saw pulmonology and "they gave me a nebulizer, but it's not helping." Cough is moderate, coughing up thick white mucus. Has runny nose and "some wheezing." "That lung doctor is not helping me at all." States she has bronchitis and "needs an antibiotic." Spoke with [place] in the practice and will send triage note for review. Please advise pt. Answer Assessment - Initial Assessment Questions 1. ONSET: "When did the cough begin?"      Started 2 months ago 2. SEVERITY: "How bad is the cough today?"      severe 3. SPUTUM: "Describe the color of your sputum" (none, dry cough; clear, white, yellow, green)     White 4. HEMOPTYSIS: "Are you coughing up any blood?" If so ask: "How much?" (flecks, streaks, tablespoons, etc.)     No 5. DIFFICULTY BREATHING: "Are you having difficulty breathing?" If Yes, ask: "How bad is it?" (e.g., mild, moderate, severe)    - MILD: No SOB at rest, mild SOB with walking, speaks normally in sentences, can lie down, no retractions, pulse < 100.    - MODERATE: SOB at rest, SOB with minimal exertion and prefers to sit, cannot lie down flat, speaks in phrases, mild retractions, audible wheezing, pulse 100-120.    - SEVERE: Very SOB at rest, speaks in single words, struggling to breathe, sitting hunched forward, retractions, pulse > 120      Moderate 6. FEVER: "Do you have a fever?" If Yes, ask: "What is your temperature, how was it measured, and when did it start?"     No 7. CARDIAC HISTORY: "Do you have any history of heart disease?" (e.g., heart attack, congestive heart failure)      No 8. LUNG HISTORY: "Do you have any history of lung disease?"  (e.g., pulmonary embolus, asthma, emphysema)     Sees pulmonology  9. PE RISK FACTORS: "Do you have a history of blood clots?" (or: recent major surgery, recent prolonged travel, bedridden)     No 10. OTHER SYMPTOMS: "Do you have any other symptoms?" (e.g., runny nose,  wheezing, chest pain)       Wheezing, runny nose 11. PREGNANCY: "Is there any chance you are pregnant?" "When was your last menstrual period?"       No 12. TRAVEL: "Have you traveled out of the country in the last month?" (e.g., travel history, exposures)       No  Protocols used: [place]

## 2020-08-26 NOTE — Telephone Encounter (Signed)
Attempted to call pt. At daughters request. States "She has bronchitis." No answer and unable to leave a message.

## 2020-08-27 ENCOUNTER — Telehealth: Payer: Self-pay

## 2020-08-27 ENCOUNTER — Inpatient Hospital Stay (HOSPITAL_COMMUNITY)
Admission: EM | Admit: 2020-08-27 | Discharge: 2020-09-06 | DRG: 190 | Disposition: A | Discharge status: Home Health | Payer: Medicare Other | Attending: Hospitalist | Admitting: Hospitalist

## 2020-08-27 ENCOUNTER — Emergency Department (HOSPITAL_COMMUNITY): Payer: Medicare Other

## 2020-08-27 ENCOUNTER — Other Ambulatory Visit: Payer: Self-pay

## 2020-08-27 ENCOUNTER — Encounter (HOSPITAL_COMMUNITY): Payer: Self-pay | Admitting: *Deleted

## 2020-08-27 DIAGNOSIS — I2721 Secondary pulmonary arterial hypertension: Secondary | ICD-10-CM | POA: Diagnosis present

## 2020-08-27 DIAGNOSIS — K219 Gastro-esophageal reflux disease without esophagitis: Secondary | ICD-10-CM | POA: Diagnosis present

## 2020-08-27 DIAGNOSIS — I2510 Atherosclerotic heart disease of native coronary artery without angina pectoris: Secondary | ICD-10-CM | POA: Diagnosis present

## 2020-08-27 DIAGNOSIS — J80 Acute respiratory distress syndrome: Secondary | ICD-10-CM | POA: Diagnosis not present

## 2020-08-27 DIAGNOSIS — Z95828 Presence of other vascular implants and grafts: Secondary | ICD-10-CM

## 2020-08-27 DIAGNOSIS — I10 Essential (primary) hypertension: Secondary | ICD-10-CM | POA: Diagnosis present

## 2020-08-27 DIAGNOSIS — R0602 Shortness of breath: Secondary | ICD-10-CM

## 2020-08-27 DIAGNOSIS — Z79899 Other long term (current) drug therapy: Secondary | ICD-10-CM | POA: Diagnosis not present

## 2020-08-27 DIAGNOSIS — J441 Chronic obstructive pulmonary disease with (acute) exacerbation: Principal | ICD-10-CM | POA: Diagnosis present

## 2020-08-27 DIAGNOSIS — Z8616 Personal history of COVID-19: Secondary | ICD-10-CM

## 2020-08-27 DIAGNOSIS — J9811 Atelectasis: Secondary | ICD-10-CM | POA: Diagnosis not present

## 2020-08-27 DIAGNOSIS — E039 Hypothyroidism, unspecified: Secondary | ICD-10-CM | POA: Diagnosis present

## 2020-08-27 DIAGNOSIS — Z8249 Family history of ischemic heart disease and other diseases of the circulatory system: Secondary | ICD-10-CM

## 2020-08-27 DIAGNOSIS — R0609 Other forms of dyspnea: Secondary | ICD-10-CM | POA: Diagnosis not present

## 2020-08-27 DIAGNOSIS — Z888 Allergy status to other drugs, medicaments and biological substances status: Secondary | ICD-10-CM

## 2020-08-27 DIAGNOSIS — R0600 Dyspnea, unspecified: Secondary | ICD-10-CM | POA: Diagnosis not present

## 2020-08-27 DIAGNOSIS — R000 Tachycardia, unspecified: Secondary | ICD-10-CM | POA: Diagnosis present

## 2020-08-27 DIAGNOSIS — R069 Unspecified abnormalities of breathing: Secondary | ICD-10-CM | POA: Diagnosis not present

## 2020-08-27 DIAGNOSIS — Z7952 Long term (current) use of systemic steroids: Secondary | ICD-10-CM

## 2020-08-27 DIAGNOSIS — Z20822 Contact with and (suspected) exposure to covid-19: Secondary | ICD-10-CM | POA: Diagnosis present

## 2020-08-27 DIAGNOSIS — Z9981 Dependence on supplemental oxygen: Secondary | ICD-10-CM | POA: Diagnosis not present

## 2020-08-27 DIAGNOSIS — G4733 Obstructive sleep apnea (adult) (pediatric): Secondary | ICD-10-CM | POA: Diagnosis present

## 2020-08-27 DIAGNOSIS — I272 Pulmonary hypertension, unspecified: Secondary | ICD-10-CM | POA: Diagnosis present

## 2020-08-27 DIAGNOSIS — Z7982 Long term (current) use of aspirin: Secondary | ICD-10-CM

## 2020-08-27 DIAGNOSIS — J9621 Acute and chronic respiratory failure with hypoxia: Secondary | ICD-10-CM | POA: Diagnosis not present

## 2020-08-27 DIAGNOSIS — Z87891 Personal history of nicotine dependence: Secondary | ICD-10-CM

## 2020-08-27 DIAGNOSIS — Z6824 Body mass index (BMI) 24.0-24.9, adult: Secondary | ICD-10-CM

## 2020-08-27 DIAGNOSIS — Z7951 Long term (current) use of inhaled steroids: Secondary | ICD-10-CM | POA: Diagnosis not present

## 2020-08-27 DIAGNOSIS — D72829 Elevated white blood cell count, unspecified: Secondary | ICD-10-CM | POA: Diagnosis present

## 2020-08-27 DIAGNOSIS — J432 Centrilobular emphysema: Secondary | ICD-10-CM | POA: Diagnosis not present

## 2020-08-27 DIAGNOSIS — Z79890 Hormone replacement therapy (postmenopausal): Secondary | ICD-10-CM

## 2020-08-27 DIAGNOSIS — M1990 Unspecified osteoarthritis, unspecified site: Secondary | ICD-10-CM | POA: Diagnosis present

## 2020-08-27 DIAGNOSIS — I2723 Pulmonary hypertension due to lung diseases and hypoxia: Secondary | ICD-10-CM | POA: Diagnosis present

## 2020-08-27 DIAGNOSIS — R0689 Other abnormalities of breathing: Secondary | ICD-10-CM | POA: Diagnosis not present

## 2020-08-27 DIAGNOSIS — R0902 Hypoxemia: Secondary | ICD-10-CM | POA: Diagnosis not present

## 2020-08-27 DIAGNOSIS — Z7902 Long term (current) use of antithrombotics/antiplatelets: Secondary | ICD-10-CM | POA: Diagnosis not present

## 2020-08-27 DIAGNOSIS — R590 Localized enlarged lymph nodes: Secondary | ICD-10-CM | POA: Diagnosis present

## 2020-08-27 DIAGNOSIS — R54 Age-related physical debility: Secondary | ICD-10-CM | POA: Diagnosis present

## 2020-08-27 HISTORY — DX: Chronic obstructive pulmonary disease, unspecified: J44.9

## 2020-08-27 LAB — CBC WITH DIFFERENTIAL/PLATELET
Abs Immature Granulocytes: 0.05 10*3/uL (ref 0.00–0.07)
Basophils Absolute: 0.1 10*3/uL (ref 0.0–0.1)
Basophils Relative: 0 %
Eosinophils Absolute: 0 10*3/uL (ref 0.0–0.5)
Eosinophils Relative: 0 %
HCT: 39 % (ref 36.0–46.0)
Hemoglobin: 12.4 g/dL (ref 12.0–15.0)
Immature Granulocytes: 0 %
Lymphocytes Relative: 15 %
Lymphs Abs: 1.8 10*3/uL (ref 0.7–4.0)
MCH: 31.6 pg (ref 26.0–34.0)
MCHC: 31.8 g/dL (ref 30.0–36.0)
MCV: 99.2 fL (ref 80.0–100.0)
Monocytes Absolute: 0.1 10*3/uL (ref 0.1–1.0)
Monocytes Relative: 1 %
Neutro Abs: 10.2 10*3/uL — ABNORMAL HIGH (ref 1.7–7.7)
Neutrophils Relative %: 84 %
Platelets: 247 10*3/uL (ref 150–400)
RBC: 3.93 MIL/uL (ref 3.87–5.11)
RDW: 14.1 % (ref 11.5–15.5)
WBC: 12.3 10*3/uL — ABNORMAL HIGH (ref 4.0–10.5)
nRBC: 0 % (ref 0.0–0.2)

## 2020-08-27 LAB — BASIC METABOLIC PANEL
Anion gap: 11 (ref 5–15)
BUN: 9 mg/dL (ref 8–23)
CO2: 15 mmol/L — ABNORMAL LOW (ref 22–32)
Calcium: 9 mg/dL (ref 8.9–10.3)
Chloride: 113 mmol/L — ABNORMAL HIGH (ref 98–111)
Creatinine, Ser: 0.79 mg/dL (ref 0.44–1.00)
GFR, Estimated: >60 mL/min (ref >60)
Glucose, Bld: 130 mg/dL — ABNORMAL HIGH (ref 70–99)
Potassium: 4.1 mmol/L (ref 3.5–5.1)
Sodium: 139 mmol/L (ref 135–145)

## 2020-08-27 LAB — RESP PANEL BY RT-PCR (FLU A&B, COVID) ARPGX2
Influenza A by PCR: NEGATIVE
Influenza B by PCR: NEGATIVE
SARS Coronavirus 2 by RT PCR: NEGATIVE

## 2020-08-27 LAB — TROPONIN I (HIGH SENSITIVITY)
Troponin I (High Sensitivity): 4 ng/L (ref <18)
Troponin I (High Sensitivity): 4 ng/L (ref <18)

## 2020-08-27 LAB — D-DIMER, QUANTITATIVE: D-Dimer, Quant: 1.99 ug/mL-FEU — ABNORMAL HIGH (ref 0.00–0.50)

## 2020-08-27 MED ORDER — SODIUM CHLORIDE 0.9 % IV SOLN
2.0000 g | Freq: Two times a day (BID) | INTRAVENOUS | Status: DC
  Administered 2020-08-27 – 2020-08-30 (×6): 2 g via INTRAVENOUS
  Filled 2020-08-27 (×6): qty 2

## 2020-08-27 MED ORDER — DIAZEPAM 2 MG PO TABS: 1.0000 mg | ORAL_TABLET | Freq: Two times a day (BID) | ORAL | Status: DC | PRN

## 2020-08-27 MED ORDER — LEVOTHYROXINE SODIUM 75 MCG PO TABS
75.0000 ug | ORAL_TABLET | Freq: Every day | ORAL | Status: DC
  Administered 2020-08-28 – 2020-09-06 (×10): 75 ug via ORAL
  Filled 2020-08-27 (×10): qty 1

## 2020-08-27 MED ORDER — GUAIFENESIN-DM 100-10 MG/5ML PO SYRP
10.0000 mL | ORAL_SOLUTION | ORAL | Status: DC | PRN
  Administered 2020-08-29 – 2020-08-30 (×2): 10 mL via ORAL
  Filled 2020-08-27 (×3): qty 10

## 2020-08-27 MED ORDER — ASPIRIN 81 MG PO CHEW
81.0000 mg | CHEWABLE_TABLET | Freq: Every day | ORAL | Status: DC
  Administered 2020-08-28 – 2020-09-06 (×11): 81 mg via ORAL
  Filled 2020-08-27 (×11): qty 1

## 2020-08-27 MED ORDER — GABAPENTIN 100 MG PO CAPS
100.0000 mg | ORAL_CAPSULE | Freq: Every day | ORAL | Status: DC
  Administered 2020-08-27 – 2020-09-05 (×10): 100 mg via ORAL
  Filled 2020-08-27 (×10): qty 1

## 2020-08-27 MED ORDER — SODIUM CHLORIDE 0.9% FLUSH
3.0000 mL | Freq: Two times a day (BID) | INTRAVENOUS | Status: DC
  Administered 2020-08-27 – 2020-09-02 (×10): 3 mL via INTRAVENOUS

## 2020-08-27 MED ORDER — METHYLPREDNISOLONE SODIUM SUCC 125 MG IJ SOLR
60.0000 mg | Freq: Once | INTRAMUSCULAR | Status: AC
  Administered 2020-08-27: 60 mg via INTRAVENOUS
  Filled 2020-08-27: qty 2

## 2020-08-27 MED ORDER — DOCUSATE SODIUM 100 MG PO CAPS
100.0000 mg | ORAL_CAPSULE | Freq: Every day | ORAL | Status: DC | PRN
  Administered 2020-08-30: 100 mg via ORAL
  Filled 2020-08-27: qty 1

## 2020-08-27 MED ORDER — ARFORMOTEROL TARTRATE 15 MCG/2ML IN NEBU
15.0000 ug | INHALATION_SOLUTION | Freq: Two times a day (BID) | RESPIRATORY_TRACT | Status: DC
  Administered 2020-08-28 – 2020-09-06 (×19): 15 ug via RESPIRATORY_TRACT
  Filled 2020-08-27 (×24): qty 2

## 2020-08-27 MED ORDER — BUDESONIDE 0.5 MG/2ML IN SUSP
0.5000 mg | RESPIRATORY_TRACT | Status: DC
  Administered 2020-08-28 – 2020-09-06 (×18): 0.5 mg via RESPIRATORY_TRACT
  Filled 2020-08-27 (×23): qty 2

## 2020-08-27 MED ORDER — LISINOPRIL 20 MG PO TABS
20.0000 mg | ORAL_TABLET | Freq: Every day | ORAL | Status: DC
  Administered 2020-08-28: 20 mg via ORAL
  Filled 2020-08-27: qty 1

## 2020-08-27 MED ORDER — CLOPIDOGREL BISULFATE 75 MG PO TABS
75.0000 mg | ORAL_TABLET | Freq: Every day | ORAL | Status: DC
  Administered 2020-08-28 – 2020-09-06 (×10): 75 mg via ORAL
  Filled 2020-08-27 (×10): qty 1

## 2020-08-27 MED ORDER — AMLODIPINE BESYLATE 5 MG PO TABS
5.0000 mg | ORAL_TABLET | Freq: Every day | ORAL | Status: DC
  Administered 2020-08-28 – 2020-09-06 (×10): 5 mg via ORAL
  Filled 2020-08-27 (×10): qty 1

## 2020-08-27 MED ORDER — ENOXAPARIN SODIUM 40 MG/0.4ML IJ SOSY
40.0000 mg | PREFILLED_SYRINGE | INTRAMUSCULAR | Status: DC
  Administered 2020-08-28 – 2020-08-31 (×4): 40 mg via SUBCUTANEOUS
  Filled 2020-08-27 (×5): qty 0.4

## 2020-08-27 MED ORDER — IPRATROPIUM-ALBUTEROL 0.5-2.5 (3) MG/3ML IN SOLN
3.0000 mL | Freq: Once | RESPIRATORY_TRACT | Status: AC
  Administered 2020-08-27: 3 mL via RESPIRATORY_TRACT
  Filled 2020-08-27: qty 3

## 2020-08-27 MED ORDER — PREDNISONE 20 MG PO TABS
20.0000 mg | ORAL_TABLET | Freq: Two times a day (BID) | ORAL | Status: DC
  Administered 2020-08-28 – 2020-08-29 (×4): 20 mg via ORAL
  Filled 2020-08-27 (×4): qty 1

## 2020-08-27 MED ORDER — FLUTICASONE PROPIONATE 50 MCG/ACT NA SUSP: 1.0000 | Freq: Every day | NASAL | Status: DC | PRN

## 2020-08-27 MED ORDER — IOHEXOL 350 MG/ML SOLN
75.0000 mL | Freq: Once | INTRAVENOUS | Status: AC | PRN
  Administered 2020-08-27: 75 mL via INTRAVENOUS

## 2020-08-27 MED ORDER — PREDNISONE 20 MG PO TABS: 20.0000 mg | ORAL_TABLET | Freq: Every day | ORAL | Status: DC

## 2020-08-27 MED ORDER — LORATADINE 10 MG PO TABS
10.0000 mg | ORAL_TABLET | Freq: Every day | ORAL | Status: DC
  Administered 2020-08-28 – 2020-09-06 (×10): 10 mg via ORAL
  Filled 2020-08-27 (×10): qty 1

## 2020-08-27 MED ORDER — SODIUM CHLORIDE 0.9 % IV SOLN
250.0000 mL | INTRAVENOUS | Status: DC | PRN
  Administered 2020-08-30: 250 mL via INTRAVENOUS

## 2020-08-27 MED ORDER — METOPROLOL SUCCINATE ER 25 MG PO TB24
25.0000 mg | ORAL_TABLET | Freq: Every day | ORAL | Status: DC
  Administered 2020-08-28 – 2020-09-06 (×10): 25 mg via ORAL
  Filled 2020-08-27 (×10): qty 1

## 2020-08-27 MED ORDER — ALBUTEROL SULFATE (2.5 MG/3ML) 0.083% IN NEBU: 2.5000 mg | INHALATION_SOLUTION | Freq: Four times a day (QID) | RESPIRATORY_TRACT | Status: DC | PRN

## 2020-08-27 MED ORDER — ACETAMINOPHEN 325 MG PO TABS
650.0000 mg | ORAL_TABLET | Freq: Four times a day (QID) | ORAL | Status: DC | PRN
  Filled 2020-08-27: qty 2

## 2020-08-27 MED ORDER — SODIUM CHLORIDE 0.9% FLUSH: 3.0000 mL | INTRAVENOUS | Status: DC | PRN

## 2020-08-27 MED ORDER — SIMVASTATIN 20 MG PO TABS
20.0000 mg | ORAL_TABLET | Freq: Every day | ORAL | Status: DC
  Administered 2020-08-28 – 2020-09-05 (×9): 20 mg via ORAL
  Filled 2020-08-27 (×9): qty 1

## 2020-08-27 NOTE — Telephone Encounter (Signed)
Daughter, Ms. Constance Goltz, called asking if medication was sent to pharmacy. Instructed 2 medications had been sent to pt.'s pharmacy.

## 2020-08-27 NOTE — Telephone Encounter (Signed)
Pt is currently admitted will wait to see if pt gets discharged. I have blocked 6/3 for pt just in case if not Opal Sidles will do a TCC

## 2020-08-27 NOTE — Telephone Encounter (Signed)
[id number], [id number]  Patient on albuterol, Garlon Hatchet

## 2020-08-27 NOTE — Telephone Encounter (Signed)
Called and spoke with Sharyn Lull at [employer] who is requesting different J codes for the patient nebulizer medications. Spoke with [name] to let him know that the J codes her sent are not good. He said to try wheezing or Dyspnea but those are not J codes. Called and spoke with Sharyn Lull again and let her know that the only other J code we have is J 96.1. She states that it needs to be something within J41- J70.9, Advised her that we don't have any other codes to give her. She expressed understanding. Nothing further needed at this time.

## 2020-08-27 NOTE — Telephone Encounter (Signed)
Dr. Wynetta Emery you don't have any available appts for next week. You only have a 810 and double book 130

## 2020-08-27 NOTE — ED Provider Notes (Signed)
[facility] EMERGENCY DEPARTMENT Provider Note   CSN: [id number] Arrival date & time: 08/27/20  1418     History Chief Complaint  Patient presents with  . Shortness of Breath    [name] is a 80 y.o. female.  HPI Patient is an 80 year old female with past medical history significant for pulmonary hypertension, COPD, CAD, thyroid disease   She uses Pulmicort,  Was started on Augmentin and prednisone took first dose of this at 11 AM today.  States she felt much worse by noon.  She felt more weak and fatigued she did not feel she was breathing worse however when EMS arrived patient was wearing 4 L of oxygen via nasal cannula satting 75% with a severe tachypnea. She was transported to ER w 15L NRB by EMS without any additional treatments. She had taken a 3m prednisone dose at 11am and thinks she had taken the first dose of Augmentin (which she was prescribed over the phone yesterday) about the same time.  Pt had nebulized a dose of her brovana and an albuterol nebulizer earlier today without much relief. There is speculation she isn't using her MDIs well (per daugheter) which is what prompted the prescription of ICS nebulizer.   Pt states she feels much better now.   Denies CP, lightheadedness, fevers, chills, new/worse cough, hemoptysis, rashes or NV or diarrhea.   She does endorse some gen weakness that she feels has improved since she was assessed by EMS.   No significant other associated symptoms.      Past Medical History:  Diagnosis Date  . Allergy   . Arthritis   . Carotid artery occlusion   . COPD (chronic obstructive pulmonary disease) ([place]   . GERD (gastroesophageal reflux disease)   . Hypertension   . Joint pain   . Thyroid disease     Patient Active Problem List   Diagnosis Date Noted  . COVID-19 05/01/2020  . Pulmonary hypertension ([place] 01/15/2020  . Hypoxemic respiratory failure, chronic ([place] 01/15/2020  . Lung nodule,  multiple 01/15/2020  . 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 SOB (shortness of breath) 12/26/2019  . Acute on chronic respiratory failure with hypoxia ([place] 12/25/2019  . Acute respiratory failure with hypoxemia ([place] 12/25/2019  . Essential hypertension 06/06/2017  . Hyperlipidemia 06/06/2017  . Leg cramps 06/06/2017  . Vertigo 06/06/2017  . Macular degeneration 06/06/2017  . Hypothyroidism 06/06/2017  . Carotid stenosis 01/22/2014  . Aftercare following surgery of the circulatory system, [place]  . Occlusion and stenosis of carotid artery without mention of cerebral infarction 04/27/2011    Past Surgical History:  Procedure Laterality Date  . ABDOMINAL HYSTERECTOMY    . AORTIC ARCH ANGIOGRAPHY N/A 03/01/2017   Procedure: AORTIC ARCH ANGIOGRAPHY;  Surgeon: BSerafina [name] MD;  [facility];  Service: Cardiovascular;  Laterality: N/A;  . ARCH AORTOGRAM N/A 05/04/2011   Procedure: ARCH AORTOGRAM;  Surgeon: VSerafina [name] MD;  [facility];  Service: Cardiovascular;  Laterality: N/A;  . ARCH AORTOGRAM N/A 07/04/2012   Procedure: ARCH AORTOGRAM;  Surgeon: VSerafina [name] MD;  [facility];  Service: Cardiovascular;  Laterality: N/A;  . CAROTID ANGIOGRAM  July 04, 2012  . CAROTID ANGIOGRAM N/A 05/04/2011   Procedure: CAROTID ANGIOGRAM;  Surgeon: VSerafina [name] MD;  [facility];  Service: Cardiovascular;  Laterality: N/A;  . CAROTID ANGIOGRAM N/A 07/04/2012   Procedure: CAROTID ANGIOGRAM;  Surgeon: VButch Penny  Trula Slade, MD;  Location: [facility] CATH LAB;  Service: Cardiovascular;  Laterality: N/A;  . CAROTID STENT INSERTION Right 05/11/2011   Procedure: CAROTID STENT INSERTION;  Surgeon: Serafina [name], MD;  [facility];  Service: Cardiovascular;  Laterality: Right;  . carotid subclavian anastomosis  05/11/11   Right subclav. artery transected 12/17/09  . CATARACT EXTRACTION     bilateral  . EYE SURGERY       OB History    No obstetric history on file.     Family History  Problem Relation Age of Onset  . Heart failure Mother   . Cancer Father   . Breast cancer Neg Hx     Social History   Tobacco Use  . Smoking status: Former Smoker    Packs/day: 0.30    Years: 5.00    Pack years: 1.50    Types: Cigarettes    Quit date: 04/12/1980    Years since quitting: 40.4  . Smokeless tobacco: Never Used  Vaping Use  . Vaping Use: Never used  Substance Use Topics  . Alcohol use: No  . Drug use: No    Home Medications Prior to Admission medications   Medication Sig Start Date End Date Taking? Authorizing Provider  acetaminophen (TYLENOL) 325 MG tablet Take 650 mg every 6 (six) hours as needed by mouth for mild pain or headache.     [provider]  albuterol (PROVENTIL) (2.5 MG/3ML) 0.083% nebulizer solution Take 3 mLs (2.5 mg total) by nebulization every 6 (six) hours as needed for wheezing or shortness of breath. 08/15/20   Sherrilyn Rist A, MD  albuterol (VENTOLIN HFA) 108 (90 Base) MCG/ACT inhaler TAKE 2 PUFFS BY MOUTH EVERY 6 HOURS AS NEEDED FOR WHEEZE OR SHORTNESS OF BREATH 08/21/20   [name], MD  amLODipine (NORVASC) 5 MG tablet TAKE 1 TABLET BY MOUTH EVERY DAY 07/27/20   [name], MD  amoxicillin-clavulanate (AUGMENTIN) 500-125 MG tablet Take 1 tablet (500 mg total) by mouth 2 (two) times daily. 08/26/20   [name], MD  arformoterol (BROVANA) 15 MCG/2ML NEBU Take 2 mLs (15 mcg total) by nebulization 2 (two) times daily. 08/15/20   Laurin Coder, MD  aspirin 81 MG chewable tablet Chew 81 mg by mouth daily.    [provider]  budesonide (PULMICORT) 0.5 MG/2ML nebulizer solution Take 2 mLs (0.5 mg total) by nebulization in the morning and at bedtime. 08/15/20   Laurin Coder, MD  calcium-vitamin D (OSCAL WITH D) 500-200 MG-UNIT per tablet Take 1 tablet by mouth daily with breakfast.    [provider]  cetirizine (ZYRTEC) 10 MG tablet Take 10  mg daily as needed by mouth for allergies.     [provider]  clopidogrel (PLAVIX) 75 MG tablet TAKE 1 TABLET BY MOUTH EVERY DAY 08/11/20   [name], MD  diazepam (VALIUM) 2 MG tablet Take 0.5 tablets (1 mg total) by mouth every 12 (twelve) hours as needed (vertigo). 01/15/20   [name], MD  diclofenac Sodium (VOLTAREN) 1 % GEL Apply 2 g topically 4 (four) times daily. 11/25/19   Darr, Edison Nasuti, PA-C  docusate sodium ([employer] STOOL SOFTENER) 100 MG capsule TAKE 1 CAPSULE (100 MG TOTAL) BY MOUTH DAILY AS NEEDED FOR MILD CONSTIPATION. Patient taking differently: Take 100 mg by mouth daily as needed for mild constipation. TAKE 1 CAPSULE (100 MG TOTAL) BY MOUTH DAILY AS NEEDED FOR MILD CONSTIPATION. 01/15/20   [name],  MD  fluticasone (FLONASE) 50 MCG/ACT nasal spray Place 1 spray into both nostrils daily as needed for allergies or rhinitis (for sinus congestion). 01/15/20   [name], MD  gabapentin (NEURONTIN) 100 MG capsule TAKE 1 CAPSULE (100 MG TOTAL) BY MOUTH AT BEDTIME. 05/30/19   [name], MD  guaiFENesin-dextromethorphan (ROBITUSSIN DM) 100-10 MG/5ML syrup Take 10 mLs by mouth every 4 (four) hours as needed for cough. 05/04/20   Gherghe, Vella Redhead, MD  latanoprost (XALATAN) 0.005 % ophthalmic solution Place 1 drop at bedtime into both eyes. 01/15/17   [provider]  levothyroxine (SYNTHROID) 50 MCG tablet Take 1.5 tablets (75 mcg total) by mouth daily. 07/28/20   [name], MD  lisinopril (ZESTRIL) 20 MG tablet TAKE 1 TABLET BY MOUTH EVERY DAY 08/23/20   [name], MD  metoprolol succinate (TOPROL-XL) 25 MG 24 hr tablet TAKE 1 TABLET BY MOUTH EVERY DAY Patient taking differently: Take 25 mg by mouth daily. 09/24/19   [name], MD  Multiple Vitamins-Minerals (ALIVE WOMENS ENERGY) TABS Take 1 tablet by mouth daily.    [provider]  predniSONE (DELTASONE) 20 MG tablet Take 1 tablet (20 mg total) by mouth  daily with breakfast. 08/26/20   [name], MD  Probiotic Product (PROBIOTIC PO) Take 1 capsule by mouth daily.    [provider]  simvastatin (ZOCOR) 20 MG tablet TAKE 1 TABLET BY MOUTH EVERYDAY AT BEDTIME 08/20/20   [name], MD  SYSTANE ULTRA 0.4-0.3 % SOLN Place 1 drop into both eyes daily as needed (dry eyes). 10/30/19   [provider]    Allergies    Meclizine  Review of Systems   Review of Systems  Constitutional: Positive for fatigue. Negative for chills and fever.  HENT: Negative for congestion.   Eyes: Negative for pain.  Respiratory: Positive for shortness of breath. Negative for cough.   Cardiovascular: Negative for chest pain and leg swelling.  Gastrointestinal: Negative for abdominal pain, diarrhea, nausea and vomiting.  Genitourinary: Negative for dysuria.  Musculoskeletal: Negative for myalgias.  Skin: Negative for rash.  Neurological: Negative for dizziness and headaches.    Physical Exam Updated Vital Signs BP (!) 114/55   Pulse 83   Temp 97.7 F (36.5 C) (Rectal)   Resp (!) 21   Ht 5' 2" (1.575 m)   Wt 58.8 kg   SpO2 96%   BMI 23.70 kg/m   Physical Exam Vitals and nursing note reviewed.  Constitutional:      General: She is not in acute distress.    Comments: Pleasant well-appearing 80 year old.  In no acute distress.  Sitting comfortably in bed. Slight tachypnea. Speaking in full sentences.  HENT:     Head: Normocephalic and atraumatic.     Nose: Nose normal.     Mouth/Throat:     Mouth: Mucous membranes are moist.  Eyes:     General: No scleral icterus. Cardiovascular:     Rate and Rhythm: Normal rate and regular rhythm.     Pulses: Normal pulses.     Heart sounds: Normal heart sounds.  Pulmonary:     Effort: Pulmonary effort is normal. No respiratory distress.     Breath sounds: Normal breath sounds. No wheezing.     Comments: Tachypnea w normal WOB Speaks in full sentences On 5L [place] Abdominal:      Palpations: Abdomen is soft.     Tenderness: There is no abdominal tenderness. There is no  guarding or rebound.  Musculoskeletal:     Cervical back: Normal range of motion.     Right lower leg: No edema.     Left lower leg: No edema.     Comments: No lower extremity edema or calf tenderness.  Skin:    General: Skin is warm and dry.     Capillary Refill: Capillary refill takes less than 2 seconds.  Neurological:     Mental Status: She is alert. Mental status is at baseline.  Psychiatric:        Mood and Affect: Mood normal.        Behavior: Behavior normal.     ED Results / Procedures / Treatments   Labs (all labs ordered are listed, but only abnormal results are displayed) Labs Reviewed  BASIC METABOLIC PANEL - Abnormal; Notable for the following components:      Result Value   Chloride 113 (*)    CO2 15 (*)    Glucose, Bld 130 (*)    All other components within normal limits  CBC WITH DIFFERENTIAL/PLATELET - Abnormal; Notable for the following components:   WBC 12.3 (*)    Neutro Abs 10.2 (*)    All other components within normal limits  D-DIMER, QUANTITATIVE - Abnormal; Notable for the following components:   D-Dimer, Quant 1.99 (*)    All other components within normal limits  RESP PANEL BY RT-PCR (FLU A&B, COVID) [id number]  TROPONIN I (HIGH SENSITIVITY)  TROPONIN I (HIGH SENSITIVITY)    EKG EKG Interpretation  Date/Time:  Wednesday Aug 27 2020 14:34:22 EDT Ventricular Rate:  113 PR Interval:  181 QRS Duration: 73 QT Interval:  324 QTC Calculation: 445 R Axis:   43 Text Interpretation: Sinus tachycardia Ventricular premature complex Nonspecific repol abnormality, diffuse leads Confirmed by Dene Gentry [id number]) on 08/27/2020 2:53:05 PM   Radiology CT Angio Chest PE W/Cm &/Or Wo Cm  Result Date: 08/27/2020 CLINICAL DATA:  Shortness of breath and hypoxia EXAM: CT ANGIOGRAPHY CHEST WITH CONTRAST TECHNIQUE: Multidetector CT imaging of the chest was performed using  the standard protocol during bolus administration of intravenous contrast. Multiplanar CT image reconstructions and MIPs were obtained to evaluate the vascular anatomy. CONTRAST:  59m OMNIPAQUE IOHEXOL 350 MG/ML SOLN COMPARISON:  05/01/2020, chest x-ray from 08/27/2020 FINDINGS: Cardiovascular: Thoracic aorta and its branches demonstrate atherosclerotic calcification. No aneurysmal dilatation or focal dissection is seen although the descending thoracic aorta is limited in the degree of opacification. No cardiac enlargement is noted. Coronary calcifications are seen. The pulmonary artery is well visualized within normal branching pattern. No large central pulmonary embolism is seen. The peripheral branches are somewhat limited in the lower lobes due to motion artifact. No discrete embolus is seen. Mediastinum/Nodes: Thoracic inlet is within normal limits. Scattered hilar and mediastinal lymph nodes are seen. These measure up to 10 mm in short axis and likely reactive in nature. The esophagus as visualized is within normal limits. Lungs/Pleura: Emphysematous changes of lungs are noted. No focal infiltrate or sizable effusion is noted. Mild right basilar atelectasis is seen. No sizable parenchymal nodules are noted. Upper Abdomen: Visualized upper abdomen is within normal limits. Musculoskeletal: Degenerative changes of the thoracic spine are noted. Review of the MIP images confirms the above findings. IMPRESSION: No definitive pulmonary emboli. The lower lobe branches are somewhat limited due to patient motion artifact and incomplete opacification. Scattered hilar and mediastinal lymph nodes measuring up to 10 mm in short axis. These are likely reactive in nature. Possibility of  underlying process such as sarcoidosis would deserve consideration as well. These are stable from the prior CT examination. Mild emphysematous changes with mild right basilar atelectasis. Aortic Atherosclerosis (ICD10-I70.0) and Emphysema  (ICD10-J43.9). Electronically Signed   By: Inez Catalina M.D.   On: 08/27/2020 18:01   DG Chest Port 1 View  Result Date: 08/27/2020 CLINICAL DATA:  Shortness of breath. EXAM: PORTABLE CHEST 1 VIEW COMPARISON:  July 09, 2020 FINDINGS: Chronic appearing increased interstitial lung markings are noted. Mild atelectasis is seen within the bilateral lung bases. There is no evidence of a pleural effusion or pneumothorax. The heart size and mediastinal contours are within normal limits. A radiopaque stent is seen along the superior mediastinum on the right. The visualized skeletal structures are unremarkable. IMPRESSION: No acute or active cardiopulmonary disease. Electronically Signed   By: Virgina Norfolk M.D.   On: 08/27/2020 15:44    Procedures .Critical Care Performed by: Tedd Sias, PA Authorized by: Tedd Sias, PA   Critical care provider statement:    Critical care time (minutes):  35   Critical care time was exclusive of:  Separately billable procedures and treating other patients and teaching time   Critical care was necessary to treat or prevent imminent or life-threatening deterioration of the following conditions:  Respiratory failure   Critical care was time spent personally by me on the following activities:  Discussions with consultants, evaluation of patient's response to treatment, examination of patient, review of old charts, re-evaluation of patient's condition, pulse oximetry, ordering and review of radiographic studies, ordering and review of laboratory studies and ordering and performing treatments and interventions   I assumed direction of critical care for this patient from another provider in my specialty: no       Medications Ordered in ED Medications  iohexol (OMNIPAQUE) 350 MG/ML injection 75 mL (75 mLs Intravenous Contrast Given 08/27/20 1726)  ipratropium-albuterol (DUONEB) 0.5-2.5 (3) MG/3ML nebulizer solution 3 mL (3 mLs Nebulization Given 08/27/20 1940)   methylPREDNISolone sodium succinate (SOLU-MEDROL) 125 mg/2 mL injection 60 mg (60 mg Intravenous Given 08/27/20 1939)    ED Course  I have reviewed the triage vital signs and the nursing notes.  Pertinent labs & imaging results that were available during my care of the patient were reviewed by me and considered in my medical decision making (see chart for details).  Pt w hx discussed in HPI (specifically pulm htn, copd, chronic O2) presents newly more hypoxic and with tachypnea. On my initial evaluation she is very comfortable and able to provide history. Mildly tachypneic but non-toxic. Well appearing.   The causes for shortness of breath include but are not limited to Cardiac (AHF, pericardial effusion and tamponade, arrhythmias, ischemia, etc) Respiratory (COPD, asthma, pneumonia, pneumothorax, primary pulmonary hypertension, PE/VQ mismatch) Hematological (anemia) Neuromuscular (ALS, Guillain-Barr, etc) PE Unremarkable -- notably no wheezing with good air movement and speaking in full sentences. Some concern for PE given this presentation.   CBC w mild leukocytosis likely from steroids BMP unremarkable apart from low bicarb likely 2/2 tachypnea   Oncoming provider aware of patient she is satting >95% on 5L which is her home O2 level so will leave patient on this with continuous cardiac and pulse ox monitoring.   Plan to follow up on tronponin/COVID/Flu/D-dimer -- will likely need VQ or CT PA if dimer is above age adjusted cutoff (has had dimers only mildly elevated that corrected to normal in the past).     MDM Rules/Calculators/A&P  Final Clinical Impression(s) / ED Diagnoses Final diagnoses:  SOB (shortness of breath)    Rx / DC Orders ED Discharge Orders    None       Tedd Sias, [place] 08/27/20 2002    Valarie Merino, MD 08/28/20 2231

## 2020-08-27 NOTE — ED Triage Notes (Signed)
PT here via [facility] from home for sob.  Initial call for sob. Sats of 75% and RR of 50 on RA that improved to 95% and rr of 30 on 10L.  Started an oral antibiotic and a steroid this am - states nebs were not working.

## 2020-08-27 NOTE — ED Provider Notes (Addendum)
Holly assumed at shift change from [facility], [name].  See his note for full HPI and work-up.  Briefly, patient presenting for tachypnea, fatigue, hypoxia today.  History of COPD chronically on 5 L nasal cannula.  She was found at home by EMS with on 4 L nasal cannula satting at 75% on room air with severe tachypnea.  She had treated with DuoNeb at home prior to arrival.  Was prescribed prednisone and Augmentin by PCP for suspected COPD exacerbation.  Began first doses today.  Prednisone is only 20 mg per patient's family.   On evaluation, patient states she feels fine, feels as though she is breathing at her baseline, however she is on nonrebreather at 10 L.  Physical Exam  BP (!) 110/55   Pulse 86   Temp 97.7 F (36.5 C) (Rectal)   Resp (!) 23   Ht _0  (1.575 m)   Wt 58.8 kg   SpO2 95%   BMI 23.70 kg/m   Physical Exam Constitutional:      General: She is not in acute distress. Cardiovascular:     Rate and Rhythm: Normal rate.  Pulmonary:     Effort: Pulmonary effort is normal.     Comments: Diminished lung sounds bilaterally.  Normal effort Psychiatric:        Behavior: Behavior normal.     ED Course/Procedures   Clinical Course as of 08/27/20 2321  Wed Aug 27, 2020  1922 Patient is on nonrebreather on evaluation, this was not communicated to me initially.  she states she is asymptomatic. Switched to 5L [place] (patient's baseline O2 supplementation, she dropeedd to 87% on RA with good waveform, remains hypoxicon 6L [place] as well. Replaced on nonrebreather.  Family reports she has had 20 mg of prednisone at home, will give 60 mg Solu-Medrol and DuoNeb.  Anticipated admission for acute hypoxic respiratory failure.  Suspected to be due to COPD [JR]    Clinical Course User Index [JR] Tamre Cass, [place] N, [name]    .Critical Holly Performed by: Kiaja Shorty, [place] N, [name] Authorized by: Loden Laurent, [place] N, [name]   Critical Holly provider statement:    Critical Holly time (minutes):  45    Critical Holly time was exclusive of:  Separately billable procedures and treating other patients and teaching time   Critical Holly was necessary to treat or prevent imminent or life-threatening deterioration of the following conditions:  Respiratory failure   Critical Holly was time spent personally by me on the following activities:  Discussions with consultants, evaluation of patient's response to treatment, examination of patient, ordering and performing treatments and interventions, ordering and review of laboratory studies, ordering and review of radiographic studies, pulse oximetry, re-evaluation of patient's condition, obtaining history from patient or surrogate and review of old charts    MDM  On evaluation, patient is oxygenating well on 10 L nonrebreather.  She is trialed on her home oxygen, 5 L nasal cannula, and desats to 86 to 87% on room air.  No improvement on 6 L.  Replaced on nonrebreather and satting well.  D-dimer is positive.  CTA was ordered and is negative.  Chest x-ray is also negative for infiltrate.    She sounds diminished on lung exam, she is given DuoNeb and dose of Solu-Medrol, some improvement on air movement.  However given patient's acute toxic respiratory failure, suspected to be due to COPD, patient will require admission.      Unice Vantassel, [place] N, [name] 08/27/20 [place],  [place] N, [name] 08/27/20 2325    Lajean Saver, MD 08/29/20 [phone]

## 2020-08-27 NOTE — H&P (Signed)
History and Physical    [name] DOB: 03-08-1941 DOA: 08/27/2020  PCP: [name], MD (Confirm with patient/family/NH records and if not entered, this has to be entered at [facility] point of entry) Patient coming from: home  I have personally briefly reviewed patient's old medical records in [place]  Chief Complaint: increased SOB, hypoxemia  HPI: [name] is a 80 y.o. female with medical history significant of COPD, HTN, Hypothyroidism. She is followed by [name] for pulmonary with last OV 08/15/20. She has been having increasing SOB with increasing need for Oxygen now up to 4-5 L [place]. Due to increasing symptoms she contacted her doctor who called in Augmentin and started her on oral prednisone. She continued to be short of breath with progressive tachypnea and sats of 75% on 4L. EMS activated. She was put to 15l NRB and transported to [facility] for evaluation.   ED Course: T 97.7  114/55  HR 83 RR 21 O2 sats on NRB mask 93%. ED PA exam - patient tachypneic but able to speak whole sentences. Bmet with glucose 130, Trop 4, WBC 12.3 w/ 84/15/1, Hgb 12.4. D-dime 1.99. CXR NAD, CTA no PE, emphysematous changes, no nodules, no focal infiltrates. TRH called to admit for continue management of COPD exacerbation.  Review of Systems: As per HPI otherwise 10 point review of systems negative.    Past Medical History:  Diagnosis Date  . Allergy   . Arthritis   . Carotid artery occlusion   . COPD (chronic obstructive pulmonary disease) ([place])   . GERD (gastroesophageal reflux disease)   . Hypertension   . Joint pain   . Thyroid disease     Past Surgical History:  Procedure Laterality Date  . ABDOMINAL HYSTERECTOMY    . AORTIC ARCH ANGIOGRAPHY N/A 03/01/2017   Procedure: AORTIC ARCH ANGIOGRAPHY;  Surgeon: Serafina [name], MD;  Location: [place] CV LAB;  Service: Cardiovascular;  Laterality: N/A;  . ARCH AORTOGRAM N/A 05/04/2011   Procedure: ARCH  AORTOGRAM;  Surgeon: Serafina [name], MD;  Location: [facility] CATH LAB;  Service: Cardiovascular;  Laterality: N/A;  . ARCH AORTOGRAM N/A 07/04/2012   Procedure: ARCH AORTOGRAM;  Surgeon: Serafina [name], MD;  Location: [facility] CATH LAB;  Service: Cardiovascular;  Laterality: N/A;  . CAROTID ANGIOGRAM  July 04, 2012  . CAROTID ANGIOGRAM N/A 05/04/2011   Procedure: CAROTID ANGIOGRAM;  Surgeon: Serafina [name], MD;  Location: [facility] CATH LAB;  Service: Cardiovascular;  Laterality: N/A;  . CAROTID ANGIOGRAM N/A 07/04/2012   Procedure: CAROTID ANGIOGRAM;  Surgeon: Serafina [name], MD;  Location: [facility] CATH LAB;  Service: Cardiovascular;  Laterality: N/A;  . CAROTID STENT INSERTION Right 05/11/2011   Procedure: CAROTID STENT INSERTION;  Surgeon: Serafina [name], MD;  Location: [facility] CATH LAB;  Service: Cardiovascular;  Laterality: Right;  . carotid subclavian anastomosis  05/11/11   Right subclav. artery transected 12/17/09  . CATARACT EXTRACTION     bilateral  . EYE SURGERY     Soc Hx - Married 15 years then widowed. She has two daughters and a son, 4 grandchildren. Her daughter lives with her. 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    reports that she quit smoking about 40 years ago. Her smoking use included cigarettes. She has a 1.50 pack-year smoking history. She has never used smokeless tobacco. She reports that she does not drink alcohol and does  not use drugs.  Allergies  Allergen Reactions  . Meclizine Nausea And Vomiting    Family History  Problem Relation Age of Onset  . Heart failure Mother   . Cancer Father   . Breast cancer Neg Hx      Prior to Admission medications   Medication Sig Start Date End Date Taking? Authorizing Provider  acetaminophen (TYLENOL) 325 MG tablet Take 650 mg every 6 (six) hours as needed by mouth for mild pain or headache.     [provider]  albuterol (PROVENTIL) (2.5 MG/3ML) 0.083% nebulizer solution  Take 3 mLs (2.5 mg total) by nebulization every 6 (six) hours as needed for wheezing or shortness of breath. 08/15/20   Sherrilyn Rist A, MD  albuterol (VENTOLIN HFA) 108 (90 Base) MCG/ACT inhaler TAKE 2 PUFFS BY MOUTH EVERY 6 HOURS AS NEEDED FOR WHEEZE OR SHORTNESS OF BREATH 08/21/20   [name], MD  amLODipine (NORVASC) 5 MG tablet TAKE 1 TABLET BY MOUTH EVERY DAY 07/27/20   [name], MD  amoxicillin-clavulanate (AUGMENTIN) 500-125 MG tablet Take 1 tablet (500 mg total) by mouth 2 (two) times daily. 08/26/20   [name], MD  arformoterol (BROVANA) 15 MCG/2ML NEBU Take 2 mLs (15 mcg total) by nebulization 2 (two) times daily. 08/15/20   Laurin Coder, MD  aspirin 81 MG chewable tablet Chew 81 mg by mouth daily.    [provider]  budesonide (PULMICORT) 0.5 MG/2ML nebulizer solution Take 2 mLs (0.5 mg total) by nebulization in the morning and at bedtime. 08/15/20   Laurin Coder, MD  calcium-vitamin D (OSCAL WITH D) 500-200 MG-UNIT per tablet Take 1 tablet by mouth daily with breakfast.    [provider]  cetirizine (ZYRTEC) 10 MG tablet Take 10 mg daily as needed by mouth for allergies.     [provider]  clopidogrel (PLAVIX) 75 MG tablet TAKE 1 TABLET BY MOUTH EVERY DAY 08/11/20   [name], MD  diazepam (VALIUM) 2 MG tablet Take 0.5 tablets (1 mg total) by mouth every 12 (twelve) hours as needed (vertigo). 01/15/20   [name], MD  diclofenac Sodium (VOLTAREN) 1 % GEL Apply 2 g topically 4 (four) times daily. 11/25/19   Darr, Edison Nasuti, PA-C  docusate sodium ([employer] STOOL SOFTENER) 100 MG capsule TAKE 1 CAPSULE (100 MG TOTAL) BY MOUTH DAILY AS NEEDED FOR MILD CONSTIPATION. Patient taking differently: Take 100 mg by mouth daily as needed for mild constipation. TAKE 1 CAPSULE (100 MG TOTAL) BY MOUTH DAILY AS NEEDED FOR MILD CONSTIPATION. 01/15/20   [name], MD  fluticasone (FLONASE) 50 MCG/ACT nasal spray Place 1 spray into  both nostrils daily as needed for allergies or rhinitis (for sinus congestion). 01/15/20   [name], MD  gabapentin (NEURONTIN) 100 MG capsule TAKE 1 CAPSULE (100 MG TOTAL) BY MOUTH AT BEDTIME. 05/30/19   [name], MD  guaiFENesin-dextromethorphan (ROBITUSSIN DM) 100-10 MG/5ML syrup Take 10 mLs by mouth every 4 (four) hours as needed for cough. 05/04/20   Gherghe, Vella Redhead, MD  latanoprost (XALATAN) 0.005 % ophthalmic solution Place 1 drop at bedtime into both eyes. 01/15/17   [provider]  levothyroxine (SYNTHROID) 50 MCG tablet Take 1.5 tablets (75 mcg total) by mouth daily. 07/28/20   [name], MD  lisinopril (ZESTRIL) 20 MG tablet TAKE 1 TABLET BY MOUTH EVERY DAY 08/23/20   [name], MD  metoprolol succinate (TOPROL-XL) 25 MG 24  hr tablet TAKE 1 TABLET BY MOUTH EVERY DAY Patient taking differently: Take 25 mg by mouth daily. 09/24/19   [name], MD  Multiple Vitamins-Minerals (ALIVE WOMENS ENERGY) TABS Take 1 tablet by mouth daily.    [provider]  predniSONE (DELTASONE) 20 MG tablet Take 1 tablet (20 mg total) by mouth daily with breakfast. 08/26/20   [name], MD  Probiotic Product (PROBIOTIC PO) Take 1 capsule by mouth daily.    [provider]  simvastatin (ZOCOR) 20 MG tablet TAKE 1 TABLET BY MOUTH EVERYDAY AT BEDTIME 08/20/20   [name], MD  SYSTANE ULTRA 0.4-0.3 % SOLN Place 1 drop into both eyes daily as needed (dry eyes). 10/30/19   [provider]    Physical Exam: Vitals:   08/27/20 1445 08/27/20 1454 08/27/20 1700 08/27/20 2230  BP: 136/63  (!) 114/55 (!) 110/55  Pulse: (!) 110  83 86  Resp: (!) 27  (!) 21 (!) 23  Temp:  97.7 F (36.5 C)    TempSrc:  Rectal    SpO2: 95%  96% 95%  Weight:      Height:         Vitals:   08/27/20 1445 08/27/20 1454 08/27/20 1700 08/27/20 2230  BP: 136/63  (!) 114/55 (!) 110/55  Pulse: (!) 110  83 86  Resp: (!) 27  (!) 21 (!) 23  Temp:   97.7 F (36.5 C)    TempSrc:  Rectal    SpO2: 95%  96% 95%  Weight:      Height:       General:  WNWD woman in no distress, resting comfortably Eyes: PERRL, lids and conjunctivae normal ENMT: Mucous membranes are moist. Posterior pharynx clear of any exudate or lesions.Normal dentition.  Neck: normal, supple, no masses, no thyromegaly Respiratory: clear to auscultation bilaterally, no wheezing, no crackles. Normal respiratory effort. No accessory muscle use.  Cardiovascular: Regular rate and rhythm, no murmurs / rubs / gallops. No extremity edema. 2+ pedal pulses. No carotid bruits.  Abdomen: no tenderness, no masses palpated. No hepatosplenomegaly. Bowel sounds positive.  Musculoskeletal: no clubbing / cyanosis. No joint deformity upper and lower extremities. Good ROM, no contractures. Normal muscle tone.  Skin: no rashes, lesions, ulcers. No induration Neurologic: CN 2-12 grossly intact. Sensation intact. Strength 5/5 in all 4.  Psychiatric: Normal judgment and insight. Alert and oriented x 3. Normal mood.     Labs on Admission: I have personally reviewed following labs and imaging studies  CBC: Recent Labs  Lab 08/27/20 1451  WBC 12.3*  NEUTROABS 10.2*  HGB 12.4  HCT 39.0  MCV 99.2  PLT 409   Basic Metabolic Panel: Recent Labs  Lab 08/27/20 1451  NA 139  K 4.1  CL 113*  CO2 15*  GLUCOSE 130*  BUN 9  CREATININE 0.79  CALCIUM 9.0   GFR: Estimated Creatinine Clearance: 43.6 mL/min (by C-G formula based on SCr of 0.79 mg/dL). Liver Function Tests: No results for input(s): AST, ALT, ALKPHOS, BILITOT, PROT, ALBUMIN in the last 168 hours. No results for input(s): LIPASE, AMYLASE in the last 168 hours. No results for input(s): AMMONIA in the last 168 hours. Coagulation Profile: No results for input(s): INR, PROTIME in the last 168 hours. Cardiac Enzymes: No results for input(s): CKTOTAL, CKMB, CKMBINDEX, TROPONINI in the last 168 hours. BNP (last 3 results) No  results for input(s): PROBNP in the last 8760 hours. HbA1C: No results for input(s): HGBA1C in the  last 72 hours. CBG: No results for input(s): GLUCAP in the last 168 hours. Lipid Profile: No results for input(s): CHOL, HDL, LDLCALC, TRIG, CHOLHDL, LDLDIRECT in the last 72 hours. Thyroid Function Tests: Recent Labs    08/25/20 0848  TSH 0.943   Anemia Panel: No results for input(s): VITAMINB12, FOLATE, FERRITIN, TIBC, IRON, RETICCTPCT in the last 72 hours. Urine analysis:    Component Value Date/Time   COLORURINE YELLOW 09/23/2015 0012   APPEARANCEUR Clear 01/15/2020 1225   LABSPEC 1.015 11/25/2019 1137   PHURINE 6.5 11/25/2019 1137   GLUCOSEU Negative 01/15/2020 1225   HGBUR NEGATIVE 11/25/2019 1137   BILIRUBINUR Negative 01/15/2020 1225   KETONESUR NEGATIVE 11/25/2019 1137   PROTEINUR Negative 01/15/2020 1225   PROTEINUR NEGATIVE 11/25/2019 1137   UROBILINOGEN 0.2 11/25/2019 1137   NITRITE Negative 01/15/2020 1225   NITRITE NEGATIVE 11/25/2019 1137   LEUKOCYTESUR Negative 01/15/2020 1225   LEUKOCYTESUR NEGATIVE 11/25/2019 1137    Radiological Exams on Admission: CT Angio Chest PE W/Cm &/Or Wo Cm  Result Date: 08/27/2020 CLINICAL DATA:  Shortness of breath and hypoxia EXAM: CT ANGIOGRAPHY CHEST WITH CONTRAST TECHNIQUE: Multidetector CT imaging of the chest was performed using the standard protocol during bolus administration of intravenous contrast. Multiplanar CT image reconstructions and MIPs were obtained to evaluate the vascular anatomy. CONTRAST:  30m OMNIPAQUE IOHEXOL 350 MG/ML SOLN COMPARISON:  05/01/2020, chest x-ray from 08/27/2020 FINDINGS: Cardiovascular: Thoracic aorta and its branches demonstrate atherosclerotic calcification. No aneurysmal dilatation or focal dissection is seen although the descending thoracic aorta is limited in the degree of opacification. No cardiac enlargement is noted. Coronary calcifications are seen. The pulmonary artery is well visualized  within normal branching pattern. No large central pulmonary embolism is seen. The peripheral branches are somewhat limited in the lower lobes due to motion artifact. No discrete embolus is seen. Mediastinum/Nodes: Thoracic inlet is within normal limits. Scattered hilar and mediastinal lymph nodes are seen. These measure up to 10 mm in short axis and likely reactive in nature. The esophagus as visualized is within normal limits. Lungs/Pleura: Emphysematous changes of lungs are noted. No focal infiltrate or sizable effusion is noted. Mild right basilar atelectasis is seen. No sizable parenchymal nodules are noted. Upper Abdomen: Visualized upper abdomen is within normal limits. Musculoskeletal: Degenerative changes of the thoracic spine are noted. Review of the MIP images confirms the above findings. IMPRESSION: No definitive pulmonary emboli. The lower lobe branches are somewhat limited due to patient motion artifact and incomplete opacification. Scattered hilar and mediastinal lymph nodes measuring up to 10 mm in short axis. These are likely reactive in nature. Possibility of underlying process such as sarcoidosis would deserve consideration as well. These are stable from the prior CT examination. Mild emphysematous changes with mild right basilar atelectasis. Aortic Atherosclerosis (ICD10-I70.0) and Emphysema (ICD10-J43.9). Electronically Signed   By: MInez CatalinaM.D.   On: 08/27/2020 18:01   DG Chest Port 1 View  Result Date: 08/27/2020 CLINICAL DATA:  Shortness of breath. EXAM: PORTABLE CHEST 1 VIEW COMPARISON:  July 09, 2020 FINDINGS: Chronic appearing increased interstitial lung markings are noted. Mild atelectasis is seen within the bilateral lung bases. There is no evidence of a pleural effusion or pneumothorax. The heart size and mediastinal contours are within normal limits. A radiopaque stent is seen along the superior mediastinum on the right. The visualized skeletal structures are unremarkable.  IMPRESSION: No acute or active cardiopulmonary disease. Electronically Signed   By: TVirgina NorfolkM.D.   On: 08/27/2020  15:44    EKG: Independently reviewed. Sinus tachycardia, no acute changes  Assessment/Plan Active Problems:   Acute on chronic respiratory failure with hypoxia (HCC)   Essential hypertension   Hypothyroidism   COPD with acute exacerbation ([place])    1. COPD with acute exacerbation - patient with emphysematous COPD now with acute increase in oxygen need. Follows with [name] for pulmonary. CXR clear, CTA chest w/o infiltrates but mild leukocytosis with left shift strongly suggestive of underlying infection as exacerbating factor. Plan Med-surg admit  Cefepime 2 g IV q8  procalcitonin to guide continue therapy  Continue home respiratory treatments ` Prednisone 20 mg bid  Continue oxygen at 5l to keep sats >90% - wean back to 4 l as tolerated  2. HTN- continue home meds  3. Hypothyroidism - has recent TSH in range. Plan  continue synthroid dose.   DVT prophylaxis: lovenox  Code Status: lFull Family Communication: Daughter in room at time of exam. No questions.  Disposition Plan: home when medically stable.  Consults called: none (with names) Admission status: inpatient    Adella Hare MD [facility] Pager [phone]  If 7PM-7AM, please contact night-coverage [email or web address] Password [facility]  08/27/2020, 10:42 PM

## 2020-08-28 ENCOUNTER — Encounter (HOSPITAL_COMMUNITY): Payer: Self-pay | Admitting: Internal Medicine

## 2020-08-28 DIAGNOSIS — J9621 Acute and chronic respiratory failure with hypoxia: Secondary | ICD-10-CM | POA: Diagnosis not present

## 2020-08-28 DIAGNOSIS — E039 Hypothyroidism, unspecified: Secondary | ICD-10-CM | POA: Diagnosis not present

## 2020-08-28 DIAGNOSIS — J441 Chronic obstructive pulmonary disease with (acute) exacerbation: Secondary | ICD-10-CM | POA: Diagnosis not present

## 2020-08-28 LAB — BRAIN NATRIURETIC PEPTIDE: B Natriuretic Peptide: 218.6 pg/mL — ABNORMAL HIGH (ref 0.0–100.0)

## 2020-08-28 LAB — PROCALCITONIN: Procalcitonin: <0.1 ng/mL

## 2020-08-28 MED ORDER — ORAL CARE MOUTH RINSE
15.0000 mL | Freq: Two times a day (BID) | OROMUCOSAL | Status: DC
  Administered 2020-08-28 – 2020-09-06 (×13): 15 mL via OROMUCOSAL

## 2020-08-28 MED ORDER — DICLOFENAC SODIUM 1 % EX GEL
2.0000 g | Freq: Four times a day (QID) | CUTANEOUS | Status: DC
  Administered 2020-08-28 – 2020-09-06 (×31): 2 g via TOPICAL
  Filled 2020-08-28 (×3): qty 100

## 2020-08-28 NOTE — ED Notes (Signed)
Pt arrived to my care on 10L NRB.  Requested Respiratory Therapy to place on high-flow [place] at 10 L.  PT is tolerating and satting 93-95%.

## 2020-08-28 NOTE — Progress Notes (Signed)
PT Cancellation Note  Patient Details Name: [name] MRN: [id number] DOB: 07-14-1940   Cancelled Treatment:    Reason Eval/Treat Not Completed: Medical issues which prohibited therapy.  Per nsg is desaturating even on HFNC with any movement, holding PT until more medically stable.   Ramond Dial 08/28/2020, 10:35 AM   Mee Hives, PT MS Acute Rehab Dept. Number: [phone] and [place] \

## 2020-08-28 NOTE — ED Notes (Signed)
Spoke with Dr. Tonie Griffith about Level of care based on pt's current oxygen requirement and he advised progressive and changed bed status.

## 2020-08-28 NOTE — Progress Notes (Signed)
OT Cancellation Note  Patient Details Name: [name] MRN: [id number] DOB: 12-15-1940   Cancelled Treatment:    Reason Eval/Treat Not Completed: Patient not medically ready.  Continue to hold for O2 needs.    Keiston Manley D Joash Tony 08/28/2020, 11:42 AM

## 2020-08-28 NOTE — Plan of Care (Signed)

## 2020-08-28 NOTE — Progress Notes (Signed)
NURSING PROGRESS NOTE  [name] [id number] Admission Data: 08/28/2020 3:47 PM Attending Provider: Geradine Girt, DO DXA:JOINOMV, Dalbert Batman, MD Code Status: Full  Allergies:  Meclizine Past Medical History:   has a past medical history of Allergy, Arthritis, Carotid artery occlusion, COPD (chronic obstructive pulmonary disease) ([place]), GERD (gastroesophageal reflux disease), Hypertension, Joint pain, and Thyroid disease. Past Surgical History:   has a past surgical history that includes Cataract extraction; Abdominal hysterectomy; carotid subclavian anastomosis (05/11/11); Eye surgery; Carotid angiogram (July 04, 2012); arch aortogram (N/A, 05/04/2011); carotid angiogram (N/A, 05/04/2011); carotid stent insertion (Right, 05/11/2011); arch aortogram (N/A, 07/04/2012); carotid angiogram (N/A, 07/04/2012); and AORTIC ARCH ANGIOGRAPHY (N/A, 03/01/2017). Social History:   reports that she quit smoking about 40 years ago. Her smoking use included cigarettes. She has a 1.50 pack-year smoking history. She has never used smokeless tobacco. She reports that she does not drink alcohol and does not use drugs.  [name] is a 80 y.o. female patient admitted from ED:   Last Documented Vital Signs: Blood pressure (!) 144/65, pulse 90, temperature 97.8 F (36.6 C), temperature source Oral, resp. rate 20, height _0  (1.575 m), weight 58.8 kg, SpO2 90 %.  Cardiac Monitoring: Box # [id number] in place. Cardiac monitor yields:normal sinus rhythm.  IV Fluids:  IV in place, occlusive dsg intact without redness, IV cath wrist right, condition patent and no redness none.   Skin: WNL, prophylactic foam placed on sacrum  Patient/Family orientated to room. Information packet given to patient/family. Admission inpatient armband information verified with patient/family to include name and date of birth and placed on patient arm. Side rails up x 2, fall assessment and education completed with patient/family.  Patient/family able to verbalize understanding of risk associated with falls and verbalized understanding to call for assistance before getting out of bed. Call light within reach. Patient/family able to voice and demonstrate understanding of unit orientation instructions.    Will continue to evaluate and treat per MD orders.   Amaryllis Dyke, RN

## 2020-08-28 NOTE — Progress Notes (Signed)
Progress Note    [name]  BBC:[id number] DOB: [birth year]  DOA: 08/27/2020 PCP: [name], MD    Brief Narrative:     Medical records reviewed and are as summarized below:  [name] is an 80 y.o. female with medical history significant of COPD, HTN, Hypothyroidism. She is followed by [name] for pulmonary with last OV 08/15/20. She has been having increasing SOB with increasing need for Oxygen now up to 5 L [place]. Due to increasing symptoms she contacted her doctor who called in Augmentin and started her on oral prednisone. She continued to be short of breath with progressive tachypnea and sats of 75% on 4L. EMS activated. She was put to 15l NRB and transported [facility] for evaluation.   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 Follows with [name] for pulmonary. CXR clear, CTA chest w/o infiltrates but mild leukocytosis with left shift strongly suggestive of underlying infection as exacerbating factor.             Cefepime 2 g IV q8             procalcitonin to guide continue therapy             Continue home respiratory treatments             Prednisone 20 mg bid            wean O2 to 5L  -check BNP as well  -? Recent COVID infection 4 months ago?  -last echo 9/21: moderately elevated pulmonary artery systolic pressure   HTN- continue home meds  Hypothyroidism -  recent TSH in range. - continue synthroid dose.    Family Communication/Anticipated D/C date and plan/Code Status   DVT prophylaxis: Lovenox ordered. Code Status: Full Code.  Disposition Plan: Status is: Inpatient  Remains inpatient appropriate because:Inpatient level of care appropriate due to severity  of illness   Dispo: The patient is from: Home              Anticipated d/c is to: Home              Patient currently is not medically stable to d/c.   Difficult to place patient No         Medical Consultants:    None.     Subjective:   Says she feels "fine"-- just get SOB with movement  Objective:    Vitals:   08/28/20 0700 08/28/20 0830 08/28/20 0926 08/28/20 1104  BP: 137/65 134/67 129/63 (!) 113/54  Pulse: 86 (!) 102  79  Resp: (!) 24 (!) 22  (!) 22  Temp:      TempSrc:      SpO2: 94% 95%  95%  Weight:      Height:        Intake/Output Summary (Last 24 hours) at 08/28/2020 1230 Last data filed at 08/28/2020 0959 Gross per 24 hour  Intake 100 ml  Output --  Net 100 ml   Filed Weights   08/27/20 1433  Weight: 58.8 kg    Exam:  General: Appearance:    elderly female in no acute distress     Lungs:  diminished, respirations unlabored  Heart:    Normal heart rate. No LE edema  MS:   All extremities are intact.   Neurologic:   Awake, alert, pleasant and cooperative    Data Reviewed:   I have personally reviewed following labs and imaging studies:  Labs: Labs show the following:   Basic Metabolic Panel: Recent Labs  Lab 08/27/20 1451  NA 139  K 4.1  CL 113*  CO2 15*  GLUCOSE 130*  BUN 9  CREATININE 0.79  CALCIUM 9.0   GFR Estimated Creatinine Clearance: 43.6 mL/min (by C-G formula based on SCr of 0.79 mg/dL). Liver Function Tests: No results for input(s): AST, ALT, ALKPHOS, BILITOT, PROT, ALBUMIN in the last 168 hours. No results for input(s): LIPASE, AMYLASE in the last 168 hours. No results for input(s): AMMONIA in the last 168 hours. Coagulation profile No results for input(s): INR, PROTIME in the last 168 hours.  CBC: Recent Labs  Lab 08/27/20 1451  WBC 12.3*  NEUTROABS 10.2*  HGB 12.4  HCT 39.0  MCV 99.2  PLT 247   Cardiac Enzymes: No results for input(s): CKTOTAL, CKMB, CKMBINDEX, TROPONINI in the last 168  hours. BNP (last 3 results) No results for input(s): PROBNP in the last 8760 hours. CBG: No results for input(s): GLUCAP in the last 168 hours. D-Dimer: Recent Labs    08/27/20 1451  DDIMER 1.99*   Hgb A1c: No results for input(s): HGBA1C in the last 72 hours. Lipid Profile: No results for input(s): CHOL, HDL, LDLCALC, TRIG, CHOLHDL, LDLDIRECT in the last 72 hours. Thyroid function studies: No results for input(s): TSH, T4TOTAL, T3FREE, THYROIDAB in the last 72 hours.  Invalid input(s): FREET3 Anemia work up: No results for input(s): VITAMINB12, FOLATE, FERRITIN, TIBC, IRON, RETICCTPCT in the last 72 hours. Sepsis Labs: Recent Labs  Lab 08/27/20 1451  WBC 12.3*    Microbiology Recent Results (from the past 240 hour(s))  Resp Panel by RT-PCR (Flu A&B, Covid) Nasopharyngeal Swab     Status: None   Collection Time: 08/27/20  3:03 PM   Specimen: Nasopharyngeal Swab; Nasopharyngeal(NP) swabs in vial transport medium  Result Value Ref Range Status   SARS Coronavirus 2 by RT PCR NEGATIVE NEGATIVE Final    Comment: (NOTE) SARS-CoV-2 target nucleic acids are NOT DETECTED.  The SARS-CoV-2 RNA is generally detectable in upper respiratory specimens during the acute phase of infection. The lowest concentration of SARS-CoV-2 viral copies this assay can detect is 138 copies/mL. A negative result does not preclude SARS-Cov-2 infection and should not be used as the sole basis for treatment or other patient management decisions. A negative result may occur with  improper specimen collection/handling, submission of specimen other than nasopharyngeal swab, presence of viral mutation(s) within the areas targeted by this assay, and inadequate number of viral copies(<138 copies/mL). A negative result must be combined with clinical observations, patient history, and epidemiological information. The expected result is Negative.  Fact Sheet for Patients:   EntrepreneurPulse.com.au  Fact Sheet for Healthcare Providers:  IncredibleEmployment.be  This test is no t yet approved or cleared by the [place] FDA and  has been authorized for detection and/or diagnosis of SARS-CoV-2 by FDA under an Emergency Use Authorization (EUA). This EUA will remain  in effect (meaning this test can be used) for the duration of the COVID-19 declaration under Section 564(b)(1) of the Act, 21 U.S.C.section 360bbb-3(b)(1), unless the authorization is terminated  or revoked sooner.       Influenza A by PCR  NEGATIVE NEGATIVE Final   Influenza B by PCR NEGATIVE NEGATIVE Final    Comment: (NOTE) The Xpert Xpress SARS-CoV-2/FLU/RSV plus assay is intended as an aid in the diagnosis of influenza from Nasopharyngeal swab specimens and should not be used as a sole basis for treatment. Nasal washings and aspirates are unacceptable for Xpert Xpress SARS-CoV-2/FLU/RSV testing.  Fact Sheet for Patients: EntrepreneurPulse.com.au  Fact Sheet for Healthcare Providers: IncredibleEmployment.be  This test is not yet approved or cleared by the [place] FDA and has been authorized for detection and/or diagnosis of SARS-CoV-2 by FDA under an Emergency Use Authorization (EUA). This EUA will remain in effect (meaning this test can be used) for the duration of the COVID-19 declaration under Section 564(b)(1) of the Act, 21 U.S.C. section 360bbb-3(b)(1), unless the authorization is terminated or revoked.  Performed at [facility], [place] [address], [place], [place] [address]     Procedures and diagnostic studies:  CT Angio Chest PE W/Cm &/Or Wo Cm  Result Date: 08/27/2020 CLINICAL DATA:  Shortness of breath and hypoxia EXAM: CT ANGIOGRAPHY CHEST WITH CONTRAST TECHNIQUE: Multidetector CT imaging of the chest was performed using the standard protocol during bolus administration of  intravenous contrast. Multiplanar CT image reconstructions and MIPs were obtained to evaluate the vascular anatomy. CONTRAST:  47m OMNIPAQUE IOHEXOL 350 MG/ML SOLN COMPARISON:  05/01/2020, chest x-ray from 08/27/2020 FINDINGS: Cardiovascular: Thoracic aorta and its branches demonstrate atherosclerotic calcification. No aneurysmal dilatation or focal dissection is seen although the descending thoracic aorta is limited in the degree of opacification. No cardiac enlargement is noted. Coronary calcifications are seen. The pulmonary artery is well visualized within normal branching pattern. No large central pulmonary embolism is seen. The peripheral branches are somewhat limited in the lower lobes due to motion artifact. No discrete embolus is seen. Mediastinum/Nodes: Thoracic inlet is within normal limits. Scattered hilar and mediastinal lymph nodes are seen. These measure up to 10 mm in short axis and likely reactive in nature. The esophagus as visualized is within normal limits. Lungs/Pleura: Emphysematous changes of lungs are noted. No focal infiltrate or sizable effusion is noted. Mild right basilar atelectasis is seen. No sizable parenchymal nodules are noted. Upper Abdomen: Visualized upper abdomen is within normal limits. Musculoskeletal: Degenerative changes of the thoracic spine are noted. Review of the MIP images confirms the above findings. IMPRESSION: No definitive pulmonary emboli. The lower lobe branches are somewhat limited due to patient motion artifact and incomplete opacification. Scattered hilar and mediastinal lymph nodes measuring up to 10 mm in short axis. These are likely reactive in nature. Possibility of underlying process such as sarcoidosis would deserve consideration as well. These are stable from the prior CT examination. Mild emphysematous changes with mild right basilar atelectasis. Aortic Atherosclerosis (ICD10-I70.0) and Emphysema (ICD10-J43.9). Electronically Signed   By: MInez Catalina M.D.   On: 08/27/2020 18:01   DG Chest Port 1 View  Result Date: 08/27/2020 CLINICAL DATA:  Shortness of breath. EXAM: PORTABLE CHEST 1 VIEW COMPARISON:  July 09, 2020 FINDINGS: Chronic appearing increased interstitial lung markings are noted. Mild atelectasis is seen within the bilateral lung bases. There is no evidence of a pleural effusion or pneumothorax. The heart size and mediastinal contours are within normal limits. A radiopaque stent is seen along the superior mediastinum on the right. The visualized skeletal structures are unremarkable. IMPRESSION: No acute or active cardiopulmonary disease. Electronically Signed   By: TVirgina NorfolkM.D.   On: 08/27/2020 15:44    Medications:   .  amLODipine  5 mg Oral Daily  . arformoterol  15 mcg Nebulization BID  . aspirin  81 mg Oral Daily  . budesonide  0.5 mg Nebulization BH-qamhs  . clopidogrel  75 mg Oral Daily  . enoxaparin (LOVENOX) injection  40 mg Subcutaneous Q24H  . gabapentin  100 mg Oral QHS  . levothyroxine  75 mcg Oral Q0600  . lisinopril  20 mg Oral Daily  . loratadine  10 mg Oral Daily  . metoprolol succinate  25 mg Oral Daily  . predniSONE  20 mg Oral BID WC  . simvastatin  20 mg Oral q1800  . sodium chloride flush  3 mL Intravenous Q12H   Continuous Infusions: . sodium chloride    . ceFEPime (MAXIPIME) IV Stopped (08/28/20 0959)     LOS: 1 day   Geradine Girt  [facility]   How to contact the [facility] Attending or Consulting provider [place] or covering provider during after hours [facility], for this patient?  1. Check the care team in [facility] and look for a) attending/consulting TRH provider listed and b) the [facility] team listed 2. Log into [email or web address] and use [place]'s universal password to access. If you do not have the password, please contact the hospital operator. 3. Locate the [facility] provider you are looking for under [facility] and page to a number that you can be directly reached. 4. If you still  have difficulty reaching the provider, please page the [facility] (Director on Call) for the Hospitalists listed on amion for assistance.  08/28/2020, 12:30 PM

## 2020-08-29 ENCOUNTER — Inpatient Hospital Stay (HOSPITAL_COMMUNITY): Payer: Medicare Other

## 2020-08-29 DIAGNOSIS — R0602 Shortness of breath: Secondary | ICD-10-CM

## 2020-08-29 DIAGNOSIS — E039 Hypothyroidism, unspecified: Secondary | ICD-10-CM | POA: Diagnosis not present

## 2020-08-29 DIAGNOSIS — J441 Chronic obstructive pulmonary disease with (acute) exacerbation: Secondary | ICD-10-CM | POA: Diagnosis not present

## 2020-08-29 DIAGNOSIS — J9621 Acute and chronic respiratory failure with hypoxia: Secondary | ICD-10-CM | POA: Diagnosis not present

## 2020-08-29 DIAGNOSIS — R0609 Other forms of dyspnea: Secondary | ICD-10-CM | POA: Diagnosis not present

## 2020-08-29 LAB — CBC
HCT: 36.8 % (ref 36.0–46.0)
Hemoglobin: 11.7 g/dL — ABNORMAL LOW (ref 12.0–15.0)
MCH: 30.6 pg (ref 26.0–34.0)
MCHC: 31.8 g/dL (ref 30.0–36.0)
MCV: 96.3 fL (ref 80.0–100.0)
Platelets: 257 10*3/uL (ref 150–400)
RBC: 3.82 MIL/uL — ABNORMAL LOW (ref 3.87–5.11)
RDW: 14.2 % (ref 11.5–15.5)
WBC: 15.3 10*3/uL — ABNORMAL HIGH (ref 4.0–10.5)
nRBC: 0 % (ref 0.0–0.2)

## 2020-08-29 LAB — BASIC METABOLIC PANEL
Anion gap: 8 (ref 5–15)
BUN: 24 mg/dL — ABNORMAL HIGH (ref 8–23)
CO2: 21 mmol/L — ABNORMAL LOW (ref 22–32)
Calcium: 9.5 mg/dL (ref 8.9–10.3)
Chloride: 109 mmol/L (ref 98–111)
Creatinine, Ser: 1.02 mg/dL — ABNORMAL HIGH (ref 0.44–1.00)
GFR, Estimated: 55 mL/min — ABNORMAL LOW (ref >60)
Glucose, Bld: 132 mg/dL — ABNORMAL HIGH (ref 70–99)
Potassium: 4.5 mmol/L (ref 3.5–5.1)
Sodium: 138 mmol/L (ref 135–145)

## 2020-08-29 LAB — ECHOCARDIOGRAM COMPLETE
AR max vel: 2.03 cm2
AV Area VTI: 2.19 cm2
AV Area mean vel: 2.19 cm2
AV Mean grad: 2 mmHg
AV Peak grad: 4.2 mmHg
Ao pk vel: 1.03 m/s
Area-P 1/2: 2.76 cm2
Height: 62 in
MV VTI: 2.27 cm2
S' Lateral: 2.9 cm
Weight: 2074.09 oz

## 2020-08-29 MED ORDER — PANTOPRAZOLE SODIUM 40 MG PO TBEC
40.0000 mg | DELAYED_RELEASE_TABLET | Freq: Every day | ORAL | Status: DC
  Administered 2020-08-29 – 2020-09-06 (×9): 40 mg via ORAL
  Filled 2020-08-29 (×9): qty 1

## 2020-08-29 MED ORDER — FUROSEMIDE 10 MG/ML IJ SOLN
20.0000 mg | Freq: Once | INTRAMUSCULAR | Status: AC
  Administered 2020-08-29: 20 mg via INTRAVENOUS
  Filled 2020-08-29: qty 2

## 2020-08-29 MED ORDER — PERFLUTREN LIPID MICROSPHERE
1.0000 mL | INTRAVENOUS | Status: AC | PRN
  Administered 2020-08-29: 3 mL via INTRAVENOUS
  Filled 2020-08-29: qty 10

## 2020-08-29 NOTE — Progress Notes (Signed)
Progress Note    [name]  HCC:[id number] DOB: March 19, 1941  DOA: 08/27/2020 PCP: [name], MD    Brief Narrative:     Medical records reviewed and are as summarized below:  [name] is an 80 y.o. female with medical history significant of COPD, HTN, Hypothyroidism. She is followed by [name] for pulmonary with last OV 08/15/20. She has been having increasing SOB with increasing need for Oxygen now up to 5 L [place]. Due to increasing symptoms she contacted her doctor who called in Augmentin and started her on oral prednisone. She continued to be short of breath with progressive tachypnea and sats of 75% on 4L. EMS activated. She was put to 15L NRB and transported to [facility] for evaluation.   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 Follows with [name] for pulmonary. CXR clear, CTA chest w/o infiltrates but mild leukocytosis with left shift strongly suggestive of underlying infection as exacerbating factor.             Cefepime 2 g IV q8             procalcitonin negative             Continue home respiratory treatments             Prednisone 20 mg bid            wean O2 to 5L  -BNP elevated so will check echo and give IV lasix x 1- monitor output  -? Recent COVID infection 4 months ago?  -last echo 9/21: moderately elevated pulmonary artery systolic pressure   HTN- hold home meds while we are diuresing  Hypothyroidism -  recent TSH in range. - continue synthroid dose.   SLP eval-- ? Issues swallowing- will add PPI in case of GERD   Family Communication/Anticipated D/C date and plan/Code Status   DVT prophylaxis: Lovenox ordered. Code Status: Full Code.   Disposition Plan: Status is: Inpatient Family at bedside Remains inpatient appropriate because:Inpatient level of care appropriate due to severity of illness   Dispo: The patient is from: Home              Anticipated d/c is to: Home              Patient currently is not medically stable to d/c.   Difficult to place patient No         Medical Consultants:    None.    Subjective:   Says she feels "fine"-- just gets SOB with movement  Objective:    Vitals:   08/29/20 0500 08/29/20 0748 08/29/20 0826 08/29/20 1135  BP: 106/61 118/62  127/66  Pulse: 69 72 77 76  Resp: 16 (!) 21 (!) 23 18  Temp: 98.2 F (36.8 C) 98.2 F (36.8 C)  97.8 F (36.6 C)  TempSrc:  Oral  Axillary  SpO2: 93% 92% 95% 92%  Weight:      Height:        Intake/Output Summary (Last 24 hours) at 08/29/2020 1147 Last data filed at 08/29/2020 1100 Gross per 24 hour  Intake 240 ml  Output 700 ml  Net -460 ml  Filed Weights   08/27/20 1433 08/28/20 1317  Weight: 58.8 kg 58.8 kg    Exam:  General: Appearance:    elderly female in no acute distress- nursing/family reports de-sats with movement and eating     Lungs:     On facemask, diminished, no wheezing  Heart:    Normal heart rate.    MS:   All extremities are intact. Min LE Edema  Neurologic:   Awake, alert, pleasant and cooperative     Data Reviewed:   I have personally reviewed following labs and imaging studies:  Labs: Labs show the following:   Basic Metabolic Panel: Recent Labs  Lab 08/27/20 1451 08/29/20 0248  NA 139 138  K 4.1 4.5  CL 113* 109  CO2 15* 21*  GLUCOSE 130* 132*  BUN 9 24*  CREATININE 0.79 1.02*  CALCIUM 9.0 9.5   GFR Estimated Creatinine Clearance: 34.2 mL/min (A) (by C-G formula based on SCr of 1.02 mg/dL (H)). Liver Function Tests: No results for input(s): AST, ALT, ALKPHOS, BILITOT, PROT, ALBUMIN in the last 168 hours. No results for input(s): LIPASE, AMYLASE in the last 168 hours. No  results for input(s): AMMONIA in the last 168 hours. Coagulation profile No results for input(s): INR, PROTIME in the last 168 hours.  CBC: Recent Labs  Lab 08/27/20 1451 08/29/20 0248  WBC 12.3* 15.3*  NEUTROABS 10.2*  --   HGB 12.4 11.7*  HCT 39.0 36.8  MCV 99.2 96.3  PLT 247 257   Cardiac Enzymes: No results for input(s): CKTOTAL, CKMB, CKMBINDEX, TROPONINI in the last 168 hours. BNP (last 3 results) No results for input(s): PROBNP in the last 8760 hours. CBG: No results for input(s): GLUCAP in the last 168 hours. D-Dimer: Recent Labs    08/27/20 1451  DDIMER 1.99*   Hgb A1c: No results for input(s): HGBA1C in the last 72 hours. Lipid Profile: No results for input(s): CHOL, HDL, LDLCALC, TRIG, CHOLHDL, LDLDIRECT in the last 72 hours. Thyroid function studies: No results for input(s): TSH, T4TOTAL, T3FREE, THYROIDAB in the last 72 hours.  Invalid input(s): FREET3 Anemia work up: No results for input(s): VITAMINB12, FOLATE, FERRITIN, TIBC, IRON, RETICCTPCT in the last 72 hours. Sepsis Labs: Recent Labs  Lab 08/27/20 1451 08/28/20 1406 08/29/20 0248  PROCALCITON  --  <0.10  --   WBC 12.3*  --  15.3*    Microbiology Recent Results (from the past 240 hour(s))  Resp Panel by RT-PCR (Flu A&B, Covid) Nasopharyngeal Swab     Status: None   Collection Time: 08/27/20  3:03 PM   Specimen: Nasopharyngeal Swab; Nasopharyngeal(NP) swabs in vial transport medium  Result Value Ref Range Status   SARS Coronavirus 2 by RT PCR NEGATIVE NEGATIVE Final    Comment: (NOTE) SARS-CoV-2 target nucleic acids are NOT DETECTED.  The SARS-CoV-2 RNA is generally detectable in upper respiratory specimens during the acute phase of infection. The lowest concentration of SARS-CoV-2 viral copies this assay can detect is 138 copies/mL. A negative result does not preclude SARS-Cov-2 infection and should not be used as the sole basis for treatment or other patient management decisions. A  negative result may occur with  improper specimen collection/handling, submission of specimen other than nasopharyngeal swab, presence of viral mutation(s) within the areas targeted by this assay, and inadequate number of viral copies(<138 copies/mL). A negative result must be combined with clinical observations, patient history, and epidemiological information. The expected result is Negative.  Fact Sheet for Patients:  EntrepreneurPulse.com.au  Fact Sheet for Healthcare Providers:  IncredibleEmployment.be  This test is no t yet approved or cleared by the [place] FDA and  has been authorized for detection and/or diagnosis of SARS-CoV-2 by FDA under an Emergency Use Authorization (EUA). This EUA will remain  in effect (meaning this test can be used) for the duration of the COVID-19 declaration under Section 564(b)(1) of the Act, 21 U.S.C.section 360bbb-3(b)(1), unless the authorization is terminated  or revoked sooner.       Influenza A by PCR NEGATIVE NEGATIVE Final   Influenza B by PCR NEGATIVE NEGATIVE Final    Comment: (NOTE) The Xpert Xpress SARS-CoV-2/FLU/RSV plus assay is intended as an aid in the diagnosis of influenza from Nasopharyngeal swab specimens and should not be used as a sole basis for treatment. Nasal washings and aspirates are unacceptable for Xpert Xpress SARS-CoV-2/FLU/RSV testing.  Fact Sheet for Patients: EntrepreneurPulse.com.au  Fact Sheet for Healthcare Providers: IncredibleEmployment.be  This test is not yet approved or cleared by the [place] FDA and has been authorized for detection and/or diagnosis of SARS-CoV-2 by FDA under an Emergency Use Authorization (EUA). This EUA will remain in effect (meaning this test can be used) for the duration of the COVID-19 declaration under Section 564(b)(1) of the Act, 21 U.S.C. section 360bbb-3(b)(1), unless the authorization  is terminated or revoked.  Performed at [facility], [place] [address] [place], [place] [address]     Procedures and diagnostic studies:  CT Angio Chest PE W/Cm &/Or Wo Cm  Result Date: 08/27/2020 CLINICAL DATA:  Shortness of breath and hypoxia EXAM: CT ANGIOGRAPHY CHEST WITH CONTRAST TECHNIQUE: Multidetector CT imaging of the chest was performed using the standard protocol during bolus administration of intravenous contrast. Multiplanar CT image reconstructions and MIPs were obtained to evaluate the vascular anatomy. CONTRAST:  14m OMNIPAQUE IOHEXOL 350 MG/ML SOLN COMPARISON:  05/01/2020, chest x-ray from 08/27/2020 FINDINGS: Cardiovascular: Thoracic aorta and its branches demonstrate atherosclerotic calcification. No aneurysmal dilatation or focal dissection is seen although the descending thoracic aorta is limited in the degree of opacification. No cardiac enlargement is noted. Coronary calcifications are seen. The pulmonary artery is well visualized within normal branching pattern. No large central pulmonary embolism is seen. The peripheral branches are somewhat limited in the lower lobes due to motion artifact. No discrete embolus is seen. Mediastinum/Nodes: Thoracic inlet is within normal limits. Scattered hilar and mediastinal lymph nodes are seen. These measure up to 10 mm in short axis and likely reactive in nature. The esophagus as visualized is within normal limits. Lungs/Pleura: Emphysematous changes of lungs are noted. No focal infiltrate or sizable effusion is noted. Mild right basilar atelectasis is seen. No sizable parenchymal nodules are noted. Upper Abdomen: Visualized upper abdomen is within normal limits. Musculoskeletal: Degenerative changes of the thoracic spine are noted. Review of the MIP images confirms the above findings. IMPRESSION: No definitive pulmonary emboli. The lower lobe branches are somewhat limited due to patient motion artifact and incomplete opacification.  Scattered hilar and mediastinal lymph nodes measuring up to 10 mm in short axis. These are likely reactive in nature. Possibility of underlying process such as sarcoidosis would deserve consideration as well. These are stable from the prior CT examination. Mild emphysematous changes with mild right basilar atelectasis. Aortic Atherosclerosis (ICD10-I70.0) and Emphysema (ICD10-J43.9). Electronically Signed   By: MInez CatalinaM.D.   On: 08/27/2020 18:01   DG Chest Port 1 View  Result Date: 08/27/2020 CLINICAL DATA:  Shortness of breath. EXAM: PORTABLE  CHEST 1 VIEW COMPARISON:  July 09, 2020 FINDINGS: Chronic appearing increased interstitial lung markings are noted. Mild atelectasis is seen within the bilateral lung bases. There is no evidence of a pleural effusion or pneumothorax. The heart size and mediastinal contours are within normal limits. A radiopaque stent is seen along the superior mediastinum on the right. The visualized skeletal structures are unremarkable. IMPRESSION: No acute or active cardiopulmonary disease. Electronically Signed   By: Virgina Norfolk M.D.   On: 08/27/2020 15:44    Medications:   . amLODipine  5 mg Oral Daily  . arformoterol  15 mcg Nebulization BID  . aspirin  81 mg Oral Daily  . budesonide  0.5 mg Nebulization BH-qamhs  . clopidogrel  75 mg Oral Daily  . diclofenac Sodium  2 g Topical QID  . enoxaparin (LOVENOX) injection  40 mg Subcutaneous Q24H  . gabapentin  100 mg Oral QHS  . levothyroxine  75 mcg Oral Q0600  . loratadine  10 mg Oral Daily  . mouth rinse  15 mL Mouth Rinse BID  . metoprolol succinate  25 mg Oral Daily  . predniSONE  20 mg Oral BID WC  . simvastatin  20 mg Oral q1800  . sodium chloride flush  3 mL Intravenous Q12H   Continuous Infusions: . sodium chloride    . ceFEPime (MAXIPIME) IV 2 g (08/29/20 1045)     LOS: 2 days   Geradine Girt  [facility]   How to contact the [facility] Attending or Consulting provider [place] or  covering provider during after hours [place], for this patient?  1. Check the care team in [place] and look for a) attending/consulting TRH provider listed and b) the [facility] team listed 2. Log into [email or web address] and use [place]'s universal password to access. If you do not have the password, please contact the hospital operator. 3. Locate the [facility] provider you are looking for under [facility] and page to a number that you can be directly reached. 4. If you still have difficulty reaching the provider, please page the [facility] (Director on Call) for the Hospitalists listed on amion for assistance.  08/29/2020, 11:47 AM

## 2020-08-29 NOTE — Progress Notes (Signed)
  Echocardiogram 2D Echocardiogram has been performed.  [name] 08/29/2020, 4:32 PM

## 2020-08-29 NOTE — Progress Notes (Signed)
OT Cancellation Note  Patient Details Name: [name] MRN: [id number] DOB: 1940-09-18   Cancelled Treatment:    Reason Eval/Treat Not Completed: Medical issues which prohibited therapy. Pt continues having problems with O2 needs, not quite ready for therapy evals yet per RN. OT will follow up next available time as appropriate  Britt Bottom 08/29/2020, 9:22 AM

## 2020-08-29 NOTE — Progress Notes (Signed)
PT Cancellation Note  Patient Details Name: [name] MRN: [id number] DOB: 1940-04-17   Cancelled Treatment:    Reason Eval/Treat Not Completed: Medical issues which prohibited therapy RN reports patient still having problems with maintaining O2 sats, not much better since this morning. Will continue to hold and f/u next date of service.    Windell Norfolk, DPT, PN1   Supplemental Physical Therapist [facility]    Pager [phone] Acute Rehab Office [phone]

## 2020-08-29 NOTE — Progress Notes (Signed)
PT Cancellation Note  Patient Details Name: [name] MRN: [id number] DOB: [birth year]   Cancelled Treatment:    Reason Eval/Treat Not Completed: Medical issues which prohibited therapy per RN, patient still having problems with O2 needs, not quite ready for PT yet. Will check back later this afternoon if time/schedule allow.    Windell Norfolk, DPT, PN1   Supplemental Physical Therapist [facility]    Pager [phone] Acute Rehab Office [phone]

## 2020-08-30 ENCOUNTER — Inpatient Hospital Stay (HOSPITAL_COMMUNITY): Payer: Medicare Other

## 2020-08-30 DIAGNOSIS — R0602 Shortness of breath: Secondary | ICD-10-CM | POA: Diagnosis not present

## 2020-08-30 DIAGNOSIS — J441 Chronic obstructive pulmonary disease with (acute) exacerbation: Principal | ICD-10-CM

## 2020-08-30 DIAGNOSIS — I10 Essential (primary) hypertension: Secondary | ICD-10-CM | POA: Diagnosis not present

## 2020-08-30 DIAGNOSIS — I272 Pulmonary hypertension, unspecified: Secondary | ICD-10-CM | POA: Diagnosis not present

## 2020-08-30 DIAGNOSIS — J9621 Acute and chronic respiratory failure with hypoxia: Secondary | ICD-10-CM | POA: Diagnosis not present

## 2020-08-30 LAB — BASIC METABOLIC PANEL
Anion gap: 9 (ref 5–15)
BUN: 31 mg/dL — ABNORMAL HIGH (ref 8–23)
CO2: 20 mmol/L — ABNORMAL LOW (ref 22–32)
Calcium: 9.3 mg/dL (ref 8.9–10.3)
Chloride: 109 mmol/L (ref 98–111)
Creatinine, Ser: 1.02 mg/dL — ABNORMAL HIGH (ref 0.44–1.00)
GFR, Estimated: 55 mL/min — ABNORMAL LOW (ref >60)
Glucose, Bld: 118 mg/dL — ABNORMAL HIGH (ref 70–99)
Potassium: 3.8 mmol/L (ref 3.5–5.1)
Sodium: 138 mmol/L (ref 135–145)

## 2020-08-30 LAB — BRAIN NATRIURETIC PEPTIDE: B Natriuretic Peptide: 107.9 pg/mL — ABNORMAL HIGH (ref 0.0–100.0)

## 2020-08-30 MED ORDER — FUROSEMIDE 10 MG/ML IJ SOLN: 20.0000 mg | Freq: Once | INTRAMUSCULAR | Status: DC

## 2020-08-30 MED ORDER — PREDNISONE 20 MG PO TABS
20.0000 mg | ORAL_TABLET | Freq: Every day | ORAL | Status: AC
  Administered 2020-08-30 – 2020-09-02 (×4): 20 mg via ORAL
  Filled 2020-08-30 (×4): qty 1

## 2020-08-30 NOTE — Progress Notes (Addendum)
Progress Note    [name]  [id number] DOB: [birth year]  DOA: 08/27/2020 PCP: [name], MD    Brief Narrative:     Medical records reviewed and are as summarized below:  [name] is an 80 y.o. female with medical history significant of COPD, HTN, Hypothyroidism. She is followed by [name] for pulmonary with last OV 08/15/20. She has been having increasing SOB with increasing need for Oxygen now up to 5 L [place]. Due to increasing symptoms she contacted her doctor who called in Augmentin and started her on oral prednisone. She continued to be short of breath with progressive tachypnea and sats of 75% on 4L. EMS activated. She was put to 15L NRB and transported to [facility] for evaluation.   Assessment/Plan:   Active Problems:   Essential hypertension   Hypothyroidism   Acute on chronic respiratory failure with hypoxia (HCC)   COPD with acute exacerbation (HCC)   Worsening pulmonary HTN with ? Fluid overload and ? COPD exacerbation  - no PFTs outpatient  -Follows with [name] for pulmonary.  -CXR clear, CTA chest w/o infiltrates but mild leukocytosis with left shift strongly suggestive of underlying infection as exacerbating factor.             procalcitonin negative so will d/c abx             Continue home respiratory treatments             Prednisone 20 mg bid- wean to off            wean O2 to 5L as able currently on 12L HF  -BNP elevated --s/p IV lasix x 1 with good output  -? 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 - continue synthroid dose.  SLP eval-- ? Issues swallowing- will add PPI in case of GERD   Family Communication/Anticipated D/C date and plan/Code Status   DVT prophylaxis: Lovenox  ordered. Code Status: Full Code.  Disposition Plan: Status is: Inpatient Family at bedside Remains inpatient appropriate because:Inpatient level of care appropriate due to severity of illness   Dispo: The patient is from: Home              Anticipated d/c is to: Home              Patient currently is not medically stable to d/c.   Difficult to place patient No         Medical Consultants:    PCCM    Subjective:   Says she urinated a lot after the lasix yesterday  Objective:    Vitals:   08/30/20 0849 08/30/20 0853 08/30/20 0905 08/30/20 1200  BP:    116/65  Pulse:    67  Resp:    (!) 23  Temp:    97.9 F (36.6 C)  TempSrc:    Oral  SpO2: 94% 96% (!) 86% 91%  Weight:      Height:        Intake/Output Summary (Last 24 hours) at 08/30/2020 1314 Last data filed at 08/29/2020 2028 Gross per 24 hour  Intake 480 ml  Output 800 ml  Net -320 ml   Filed Weights   08/27/20 1433  08/28/20 1317  Weight: 58.8 kg 58.8 kg    Exam:  General: Appearance:    elderly female in no acute distress- on HF [place]     Lungs:     Diminished, no wheezing, respirations unlabored- more comfortable than yesterday  Heart:    Normal heart rate. Normal rhythm. No murmurs, rubs, or gallops.   MS:   All extremities are intact.   Neurologic:   Awake, alert, oriented x 3      Data Reviewed:   I have personally reviewed following labs and imaging studies:  Labs: Labs show the following:   Basic Metabolic Panel: Recent Labs  Lab 08/27/20 1451 08/29/20 0248 08/30/20 0936  NA 139 138 138  K 4.1 4.5 3.8  CL 113* 109 109  CO2 15* 21* 20*  GLUCOSE 130* 132* 118*  BUN 9 24* 31*  CREATININE 0.79 1.02* 1.02*  CALCIUM 9.0 9.5 9.3   GFR Estimated Creatinine Clearance: 34.2 mL/min (A) (by C-G formula based on SCr of 1.02 mg/dL (H)). Liver Function Tests: No results for input(s): AST, ALT, ALKPHOS, BILITOT, PROT, ALBUMIN in the last 168 hours. No results for input(s): LIPASE,  AMYLASE in the last 168 hours. No results for input(s): AMMONIA in the last 168 hours. Coagulation profile No results for input(s): INR, PROTIME in the last 168 hours.  CBC: Recent Labs  Lab 08/27/20 1451 08/29/20 0248  WBC 12.3* 15.3*  NEUTROABS 10.2*  --   HGB 12.4 11.7*  HCT 39.0 36.8  MCV 99.2 96.3  PLT 247 257   Cardiac Enzymes: No results for input(s): CKTOTAL, CKMB, CKMBINDEX, TROPONINI in the last 168 hours. BNP (last 3 results) No results for input(s): PROBNP in the last 8760 hours. CBG: No results for input(s): GLUCAP in the last 168 hours. D-Dimer: Recent Labs    08/27/20 1451  DDIMER 1.99*   Hgb A1c: No results for input(s): HGBA1C in the last 72 hours. Lipid Profile: No results for input(s): CHOL, HDL, LDLCALC, TRIG, CHOLHDL, LDLDIRECT in the last 72 hours. Thyroid function studies: No results for input(s): TSH, T4TOTAL, T3FREE, THYROIDAB in the last 72 hours.  Invalid input(s): FREET3 Anemia work up: No results for input(s): VITAMINB12, FOLATE, FERRITIN, TIBC, IRON, RETICCTPCT in the last 72 hours. Sepsis Labs: Recent Labs  Lab 08/27/20 1451 08/28/20 1406 08/29/20 0248  PROCALCITON  --  <0.10  --   WBC 12.3*  --  15.3*    Microbiology Recent Results (from the past 240 hour(s))  Resp Panel by RT-PCR (Flu A&B, Covid) Nasopharyngeal Swab     Status: None   Collection Time: 08/27/20  3:03 PM   Specimen: Nasopharyngeal Swab; Nasopharyngeal(NP) swabs in vial transport medium  Result Value Ref Range Status   SARS Coronavirus 2 by RT PCR NEGATIVE NEGATIVE Final    Comment: (NOTE) SARS-CoV-2 target nucleic acids are NOT DETECTED.  The SARS-CoV-2 RNA is generally detectable in upper respiratory specimens during the acute phase of infection. The lowest concentration of SARS-CoV-2 viral copies this assay can detect is 138 copies/mL. A negative result does not preclude SARS-Cov-2 infection and should not be used as the sole basis for treatment or other  patient management decisions. A negative result may occur with  improper specimen collection/handling, submission of specimen other than nasopharyngeal swab, presence of viral mutation(s) within the areas targeted by this assay, and inadequate number of viral copies(<138 copies/mL). A negative result must be combined with clinical observations, patient history, and epidemiological information. The expected result is  Negative.  Fact Sheet for Patients:  EntrepreneurPulse.com.au  Fact Sheet for Healthcare Providers:  IncredibleEmployment.be  This test is no t yet approved or cleared by the [place] FDA and  has been authorized for detection and/or diagnosis of SARS-CoV-2 by FDA under an Emergency Use Authorization (EUA). This EUA will remain  in effect (meaning this test can be used) for the duration of the COVID-19 declaration under Section 564(b)(1) of the Act, 21 U.S.C.section 360bbb-3(b)(1), unless the authorization is terminated  or revoked sooner.       Influenza A by PCR NEGATIVE NEGATIVE Final   Influenza B by PCR NEGATIVE NEGATIVE Final    Comment: (NOTE) The Xpert Xpress SARS-CoV-2/FLU/RSV plus assay is intended as an aid in the diagnosis of influenza from Nasopharyngeal swab specimens and should not be used as a sole basis for treatment. Nasal washings and aspirates are unacceptable for Xpert Xpress SARS-CoV-2/FLU/RSV testing.  Fact Sheet for Patients: EntrepreneurPulse.com.au  Fact Sheet for Healthcare Providers: IncredibleEmployment.be  This test is not yet approved or cleared by the [place] FDA and has been authorized for detection and/or diagnosis of SARS-CoV-2 by FDA under an Emergency Use Authorization (EUA). This EUA will remain in effect (meaning this test can be used) for the duration of the COVID-19 declaration under Section 564(b)(1) of the Act, 21 U.S.C. section  360bbb-3(b)(1), unless the authorization is terminated or revoked.  Performed at [facility], [place] [address] [place],  [address]     Procedures and diagnostic studies:  ECHOCARDIOGRAM COMPLETE  Result Date: 08/29/2020    ECHOCARDIOGRAM REPORT   Patient Name:   SHUNNA MIKAELIAN Date of Exam: 08/29/2020 Medical Rec #:  [id number]       Height:       62.0 in Accession #:    [id number]      Weight:       129.6 lb Date of Birth:  10/30/1940       BSA:          1.590 m Patient Age:    69 years        BP:           127/66 mmHg Patient Gender: F               HR:           84 bpm. Exam Location:  Inpatient Procedure: 2D Echo, Cardiac Doppler, Color Doppler and Intracardiac            Opacification Agent Indications:    Dyspnea  History:        Patient has prior history of Echocardiogram examinations, most                 recent 12/26/2019. COPD, Signs/Symptoms:Shortness of Breath; Risk                 Factors:Hypertension.  Sonographer:    Clayton Lefort RDCS (AE) Referring Phys: [place]  1. Left ventricular ejection fraction, by estimation, is 60 to 65%. The left ventricle has normal function. The left ventricle has no regional wall motion abnormalities. There is mild concentric left ventricular hypertrophy. Left ventricular diastolic parameters are consistent with Grade I diastolic dysfunction (impaired relaxation).  2. Right ventricular systolic function is mildly reduced. The right ventricular size is moderately enlarged. There is severely elevated pulmonary artery systolic pressure. The estimated right ventricular systolic pressure is 51.0 mmHg.  3. The mitral valve is normal in structure. No  evidence of mitral valve regurgitation. No evidence of mitral stenosis.  4. The aortic valve is tricuspid. There is mild calcification of the aortic valve. There is mild thickening of the aortic valve. Aortic valve regurgitation is not visualized. Mild aortic valve sclerosis is present,  with no evidence of aortic valve stenosis. Comparison(s): Compared to prior TTE in 12/2019, there is severe pulmonary HTN with PASP 83mHg. FINDINGS  Left Ventricle: Left ventricular ejection fraction, by estimation, is 60 to 65%. The left ventricle has normal function. The left ventricle has no regional wall motion abnormalities. Definity contrast agent was given IV to delineate the left ventricular  endocardial borders. The left ventricular internal cavity size was normal in size. There is mild concentric left ventricular hypertrophy. Left ventricular diastolic parameters are consistent with Grade I diastolic dysfunction (impaired relaxation). Right Ventricle: The right ventricular size is moderately enlarged. Right vetricular wall thickness was not well visualized. Right ventricular systolic function is mildly reduced. There is severely elevated pulmonary artery systolic pressure. The tricuspid regurgitant velocity is 4.04 m/s, and with an assumed right atrial pressure of 8 mmHg, the estimated right ventricular systolic pressure is 786.5mmHg. Left Atrium: Left atrial size was normal in size. Right Atrium: Right atrial size was normal in size. Pericardium: There is no evidence of pericardial effusion. Mitral Valve: The mitral valve is normal in structure. There is mild thickening of the mitral valve leaflet(s). There is mild calcification of the mitral valve leaflet(s). No evidence of mitral valve regurgitation. No evidence of mitral valve stenosis. MV peak gradient, 2.6 mmHg. The mean mitral valve gradient is 1.0 mmHg. Tricuspid Valve: The tricuspid valve is normal in structure. Tricuspid valve regurgitation is trivial. Aortic Valve: The aortic valve is tricuspid. There is mild calcification of the aortic valve. There is mild thickening of the aortic valve. Aortic valve regurgitation is not visualized. Mild aortic valve sclerosis is present, with no evidence of aortic valve stenosis. Aortic valve mean gradient  measures 2.0 mmHg. Aortic valve peak gradient measures 4.2 mmHg. Aortic valve area, by VTI measures 2.19 cm. Pulmonic Valve: The pulmonic valve was normal in structure. Pulmonic valve regurgitation is trivial. Aorta: The aortic root and ascending aorta are structurally normal, with no evidence of dilitation. IAS/Shunts: No atrial level shunt detected by color flow Doppler.  LEFT VENTRICLE PLAX 2D LVIDd:         4.10 cm  Diastology LVIDs:         2.90 cm  LV e' medial:    7.29 cm/s LV PW:         1.30 cm  LV E/e' medial:  8.2 LV IVS:        1.40 cm  LV e' lateral:   8.92 cm/s LVOT diam:     1.90 cm  LV E/e' lateral: 6.7 LV SV:         42 LV SV Index:   26 LVOT Area:     2.84 cm  RIGHT VENTRICLE            IVC RV Basal diam:  3.00 cm    IVC diam: 2.20 cm RV S prime:     8.70 cm/s TAPSE (M-mode): 1.3 cm LEFT ATRIUM             Index       RIGHT ATRIUM           Index LA diam:        2.00 cm 1.26 cm/m  RA Area:  13.60 cm LA Vol (A2C):   30.1 ml 18.93 ml/m RA Volume:   33.70 ml  21.20 ml/m LA Vol (A4C):   21.4 ml 13.46 ml/m LA Biplane Vol: 25.5 ml 16.04 ml/m  AORTIC VALVE AV Area (Vmax):    2.03 cm AV Area (Vmean):   2.19 cm AV Area (VTI):     2.19 cm AV Vmax:           103.00 cm/s AV Vmean:          74.100 cm/s AV VTI:            0.192 m AV Peak Grad:      4.2 mmHg AV Mean Grad:      2.0 mmHg LVOT Vmax:         73.80 cm/s LVOT Vmean:        57.300 cm/s LVOT VTI:          0.148 m LVOT/AV VTI ratio: 0.77  AORTA Ao Root diam: 3.40 cm Ao Asc diam:  3.10 cm MITRAL VALVE               TRICUSPID VALVE MV Area (PHT): 2.76 cm    TR Peak grad:   65.3 mmHg MV Area VTI:   2.27 cm    TR Vmax:        404.00 cm/s MV Peak grad:  2.6 mmHg MV Mean grad:  1.0 mmHg    SHUNTS MV Vmax:       0.81 m/s    Systemic VTI:  0.15 m MV Vmean:      48.1 cm/s   Systemic Diam: 1.90 cm MV Decel Time: 275 msec MV E velocity: 59.55 cm/s MV A velocity: 68.60 cm/s MV E/A ratio:  0.87 Gwyndolyn Kaufman MD Electronically signed by Gwyndolyn Kaufman MD Signature Date/Time: 08/29/2020/5:44:55 PM    Final     Medications:   . amLODipine  5 mg Oral Daily  . arformoterol  15 mcg Nebulization BID  . aspirin  81 mg Oral Daily  . budesonide  0.5 mg Nebulization BH-qamhs  . clopidogrel  75 mg Oral Daily  . diclofenac Sodium  2 g Topical QID  . enoxaparin (LOVENOX) injection  40 mg Subcutaneous Q24H  . gabapentin  100 mg Oral QHS  . levothyroxine  75 mcg Oral Q0600  . loratadine  10 mg Oral Daily  . mouth rinse  15 mL Mouth Rinse BID  . metoprolol succinate  25 mg Oral Daily  . pantoprazole  40 mg Oral Daily  . predniSONE  20 mg Oral Q breakfast  . simvastatin  20 mg Oral q1800  . sodium chloride flush  3 mL Intravenous Q12H   Continuous Infusions: . sodium chloride 250 mL (08/30/20 0051)  . ceFEPime (MAXIPIME) IV 2 g (08/30/20 0910)     LOS: 3 days   Geradine Girt  [facility]   How to contact the [facility] Attending or Consulting provider [place] or covering provider during after hours [place], for this patient?  1. Check the care team in [facility] and look for a) attending/consulting TRH provider listed and b) the [facility] team listed 2. Log into [email or web address] and use [place]'s universal password to access. If you do not have the password, please contact the hospital operator. 3. Locate the [facility] provider you are looking for under [facility] and page to a number that you can be directly reached. 4. If you still have difficulty reaching the provider,  please page the [facility] (Director on Call) for the Hospitalists listed on amion for assistance.  08/30/2020, 1:14 PM

## 2020-08-30 NOTE — Progress Notes (Signed)
Attempted to wean o2 down to 10L [place]. Patient's sats dropped to 86% with good pleth. Educated patient and daughter on hourly IS use.

## 2020-08-30 NOTE — Evaluation (Signed)
Physical Therapy Evaluation Patient Details Name: [name] MRN: [id number] DOB: [birth year] Today's Date: 08/30/2020   History of Present Illness  80 y.o. female brought to ED for shortness of breath 08/27/20 via EMS Sats of 75% and RR of 50 on RA that improved to 95% and rr of 30 on 10L. In  ED RR 21 O2 sats on NRB mask 93% Admitted for treatment of acute exacerbation of COPD PMH: COPD, HTN, Hypothyroidism.  Clinical Impression  PTA pt living alone in single story home with 3 steps to enter. Pt independent in household ambulation, bird bathing and dressing. Daughter assists with showering, iADLs and transportation. Pt is currently limited in safe mobility by increased O2 demand of 14L via HFNC for mobility, in presence of generalized weakness from immobility. Pt is currently supervision for bed mobility, and min guard for transfers and ambulation of 3 feet without AD. PT recommending HHPT at discharge for increasing strength and endurance and initial 24 hour supervision for safety. PT will continue to follow acutely.     Follow Up Recommendations Home health PT;Supervision/Assistance - 24 hour    Equipment Recommendations  3in1 (PT);Wheelchair (measurements PT)    Recommendations for Other Services OT consult     Precautions / Restrictions Precautions Precautions: None Restrictions Weight Bearing Restrictions: No      Mobility  Bed Mobility Overal bed mobility: Needs Assistance Bed Mobility: Supine to Sit     Supine to sit: Supervision     General bed mobility comments: supervision for safety and management of lines    Transfers Overall transfer level: Needs assistance   Transfers: Sit to/from Stand Sit to Stand: Min guard         General transfer comment: min guard for safety, good power up and self steadying  Ambulation/Gait Ambulation/Gait assistance: Min guard Gait Distance (Feet): 3 Feet Assistive device: None Gait Pattern/deviations: Step-through  pattern;Decreased step length - right;Decreased step length - left;Shuffle Gait velocity: slowed Gait velocity interpretation: <1.31 ft/sec, indicative of household ambulator General Gait Details: min guard for shuffling steps from bed to chair         Balance Overall balance assessment: Mild deficits observed, not formally tested                                           Pertinent Vitals/Pain Pain Assessment: No/denies pain    Home Living Family/patient expects to be discharged to:: Private residence Living Arrangements: Children Available Help at Discharge: Available 24 hours/day;Family (with initial discharge) Type of Home: House Home Access: Level entry     Home Layout: One level        Prior Function Level of Independence: Needs assistance   Gait / Transfers Assistance Needed: 6 months ago walking in neighborhood daily, last month only household distances without AD  ADL's / Homemaking Assistance Needed: daughter assists with showers, meals and transportation           Extremity/Trunk Assessment   Upper Extremity Assessment Upper Extremity Assessment: Defer to OT evaluation    Lower Extremity Assessment Lower Extremity Assessment: Generalized weakness       Communication   Communication: No difficulties  Cognition Arousal/Alertness: Awake/alert Behavior During Therapy: WFL for tasks assessed/performed Overall Cognitive Status: Within Functional Limits for tasks assessed  General Comments General comments (skin integrity, edema, etc.): Pt on 12L O2 via HFNC on entry SaO2 92%O2, with sitting up SaO2 dropped to 79%O2, cued in purse lip breathing and recovered to 85%O2 increased to 14L O2 via HFNC and SaO2 rebounded to 92%O2, with ambulation to chair, and increased cuing pt able to maintain SaO2 >85%O2, with sitting 2 minutes rebounded to 92%O2 and able to wean back to 12 L via HFNC,  max HR with ambulation noted at 121bpm        Assessment/Plan    PT Assessment Patient needs continued PT services  PT Problem List Decreased strength;Decreased activity tolerance;Decreased mobility;Cardiopulmonary status limiting activity       PT Treatment Interventions DME instruction;Gait training;Stair training;Functional mobility training;Therapeutic activities;Therapeutic exercise;Balance training;Cognitive remediation;Patient/family education    PT Goals (Current goals can be found in the Care Plan section)  Acute Rehab PT Goals Patient Stated Goal: get back to walking around the neighborhood PT Goal Formulation: With patient Time For Goal Achievement: 09/13/20 Potential to Achieve Goals: Good    Frequency Min 3X/week    AM-PAC PT "6 Clicks" Mobility  Outcome Measure Help needed turning from your back to your side while in a flat bed without using bedrails?: None Help needed moving from lying on your back to sitting on the side of a flat bed without using bedrails?: None Help needed moving to and from a bed to a chair (including a wheelchair)?: None Help needed standing up from a chair using your arms (e.g., wheelchair or bedside chair)?: None Help needed to walk in hospital room?: A Little Help needed climbing 3-5 steps with a railing? : A Lot 6 Click Score: 21    End of Session Equipment Utilized During Treatment: Gait belt;Oxygen Activity Tolerance: Treatment limited secondary to medical complications (Comment) (increased O2 demand) Patient left: in chair;with call bell/phone within reach;with chair alarm set;with family/visitor present Nurse Communication: Mobility status PT Visit Diagnosis: Other abnormalities of gait and mobility (R26.89);Difficulty in walking, not elsewhere classified (R26.2);Muscle weakness (generalized) (M62.81)    Time: 3825-0539 PT Time Calculation (min) (ACUTE ONLY): 36 min   Charges:   PT Evaluation $PT Eval Moderate Complexity: 1  Mod PT Treatments $Therapeutic Activity: 8-22 mins        Unknown Flannigan B. Migdalia Dk PT, DPT Acute Rehabilitation Services Pager [phone] Office [phone]   [place] 08/30/2020, 3:52 PM

## 2020-08-30 NOTE — Plan of Care (Signed)
  Problem: Education: Goal: Knowledge of General Education information will improve Description: Including pain rating scale, medication(s)/side effects and non-pharmacologic comfort measures Outcome: Progressing   Problem: Nutrition: Goal: Adequate nutrition will be maintained Outcome: Progressing   Problem: Coping: Goal: Level of anxiety will decrease Outcome: Progressing

## 2020-08-30 NOTE — Consult Note (Signed)
CONSULTATION NOTE   Patient Name: [name] Date of Encounter: 08/30/2020 Cardiologist: None Electrophysiologist: None Advanced Heart Failure: None   Chief Complaint   Shortness of breath  Patient Profile   80 yo female with primary pulmonary hypertension, presents with progressive dyspnea on exertion, seen by pulmonary who requests a right heart cath  HPI   [name] is a 80 y.o. female who is being seen today for the evaluation of pulmonary hypertension at the request of Dr. Eliseo Squires. This is a 80 year old female with pulmonary hypertension followed by [name].  Echo in 2021 showed normal LVEF 60 to 65% with elevated RVSP of 60 mmHg.  She presents now with progressive shortness of breath and a repeat echo shows stable LVEF 60 to 65% but an increase in RVSP to 73 mmHg.  Pulmonary was consulted and suggested right heart catheterization.  Cardiology is asked to evaluate for suitability of right heart catheterization. She currently reports improvement in her dyspnea and no chest painl.  PMHx   Past Medical History:  Diagnosis Date  . Allergy   . Arthritis   . Carotid artery occlusion   . COPD (chronic obstructive pulmonary disease) ([place])   . GERD (gastroesophageal reflux disease)   . Hypertension   . Joint pain   . Thyroid disease     Past Surgical History:  Procedure Laterality Date  . ABDOMINAL HYSTERECTOMY    . AORTIC ARCH ANGIOGRAPHY N/A 03/01/2017   Procedure: AORTIC ARCH ANGIOGRAPHY;  Surgeon: Serafina [name], MD;  Location: [facility];  Service: Cardiovascular;  Laterality: N/A;  . ARCH AORTOGRAM N/A 05/04/2011   Procedure: ARCH AORTOGRAM;  Surgeon: Serafina [name], MD;  Location: [facility] CATH LAB;  Service: Cardiovascular;  Laterality: N/A;  . ARCH AORTOGRAM N/A 07/04/2012   Procedure: ARCH AORTOGRAM;  Surgeon: Serafina [name], MD;  Location: [facility] CATH LAB;  Service: Cardiovascular;  Laterality: N/A;  . CAROTID ANGIOGRAM  July 04, 2012  . CAROTID  ANGIOGRAM N/A 05/04/2011   Procedure: CAROTID ANGIOGRAM;  Surgeon: Serafina [name], MD;  Location: [facility] CATH LAB;  Service: Cardiovascular;  Laterality: N/A;  . CAROTID ANGIOGRAM N/A 07/04/2012   Procedure: CAROTID ANGIOGRAM;  Surgeon: Serafina [name], MD;  Location: [facility] CATH LAB;  Service: Cardiovascular;  Laterality: N/A;  . CAROTID STENT INSERTION Right 05/11/2011   Procedure: CAROTID STENT INSERTION;  Surgeon: Serafina [name], MD;  Location: [facility] CATH LAB;  Service: Cardiovascular;  Laterality: Right;  . carotid subclavian anastomosis  05/11/11   Right subclav. artery transected 12/17/09  . CATARACT EXTRACTION     bilateral  . EYE SURGERY      FAMHx   Family History  Problem Relation Age of Onset  . Heart failure Mother   . Cancer Father   . Breast cancer Neg Hx     SOCHx    reports that she quit smoking about 40 years ago. Her smoking use included cigarettes. She has a 1.50 pack-year smoking history. She has never used smokeless tobacco. She reports that she does not drink alcohol and does not use drugs.  Outpatient Medications   No current facility-administered medications on file prior to encounter.   Current Outpatient Medications on File Prior to Encounter  Medication Sig Dispense Refill  . acetaminophen (TYLENOL) 325 MG tablet Take 650 mg every 6 (six) hours as needed by mouth for mild pain or headache.     [name] HFA 294-76 MCG/ACT inhaler Inhale 2 puffs into  the lungs 2 (two) times daily.    Marland Kitchen albuterol (PROVENTIL) (2.5 MG/3ML) 0.083% nebulizer solution Take 3 mLs (2.5 mg total) by nebulization every 6 (six) hours as needed for wheezing or shortness of breath. 360 mL 11  . albuterol (VENTOLIN HFA) 108 (90 Base) MCG/ACT inhaler TAKE 2 PUFFS BY MOUTH EVERY 6 HOURS AS NEEDED FOR WHEEZE OR SHORTNESS OF BREATH (Patient taking differently: Inhale 2 puffs into the lungs every 6 (six) hours as needed for wheezing or shortness of breath.) 18 each 2  . amLODipine (NORVASC) 5 MG tablet  TAKE 1 TABLET BY MOUTH EVERY DAY (Patient taking differently: Take 5 mg by mouth daily.) 90 tablet 0  . amoxicillin-clavulanate (AUGMENTIN) 500-125 MG tablet Take 1 tablet (500 mg total) by mouth 2 (two) times daily. 10 tablet 0  . arformoterol (BROVANA) 15 MCG/2ML NEBU Take 2 mLs (15 mcg total) by nebulization 2 (two) times daily. 120 mL 11  . aspirin 81 MG chewable tablet Chew 81 mg by mouth daily.    . budesonide (PULMICORT) 0.5 MG/2ML nebulizer solution Take 2 mLs (0.5 mg total) by nebulization in the morning and at bedtime. 120 mL 11  . calcium-vitamin D (OSCAL WITH D) 500-200 MG-UNIT per tablet Take 1 tablet by mouth daily with breakfast.    . cetirizine (ZYRTEC) 10 MG tablet Take 10 mg daily as needed by mouth for allergies.     Marland Kitchen clopidogrel (PLAVIX) 75 MG tablet TAKE 1 TABLET BY MOUTH EVERY DAY (Patient taking differently: Take 75 mg by mouth daily.) 30 tablet 0  . docusate sodium ([employer] STOOL SOFTENER) 100 MG capsule TAKE 1 CAPSULE (100 MG TOTAL) BY MOUTH DAILY AS NEEDED FOR MILD CONSTIPATION. (Patient taking differently: Take 100 mg by mouth daily as needed for mild constipation. TAKE 1 CAPSULE (100 MG TOTAL) BY MOUTH DAILY AS NEEDED FOR MILD CONSTIPATION.) 30 capsule 2  . fluticasone (FLONASE) 50 MCG/ACT nasal spray Place 1 spray into both nostrils daily as needed for allergies or rhinitis (for sinus congestion). 48 mL 3  . gabapentin (NEURONTIN) 100 MG capsule TAKE 1 CAPSULE (100 MG TOTAL) BY MOUTH AT BEDTIME. 90 capsule 1  . guaiFENesin-dextromethorphan (ROBITUSSIN DM) 100-10 MG/5ML syrup Take 10 mLs by mouth every 4 (four) hours as needed for cough. 118 mL 0  . latanoprost (XALATAN) 0.005 % ophthalmic solution Place 1 drop at bedtime into both eyes.    Marland Kitchen levothyroxine (SYNTHROID) 50 MCG tablet Take 1.5 tablets (75 mcg total) by mouth daily. 45 tablet 1  . lisinopril (ZESTRIL) 20 MG tablet TAKE 1 TABLET BY MOUTH EVERY DAY (Patient taking differently: Take 20 mg by mouth daily.) 30 tablet 0   . metoprolol succinate (TOPROL-XL) 25 MG 24 hr tablet TAKE 1 TABLET BY MOUTH EVERY DAY (Patient taking differently: Take 25 mg by mouth daily.) 30 tablet 1  . Multiple Vitamins-Minerals (ALIVE WOMENS ENERGY) TABS Take 1 tablet by mouth daily.    . predniSONE (DELTASONE) 20 MG tablet Take 1 tablet (20 mg total) by mouth daily with breakfast. 5 tablet 0  . Probiotic Product (PROBIOTIC PO) Take 1 capsule by mouth daily.    . simvastatin (ZOCOR) 20 MG tablet TAKE 1 TABLET BY MOUTH EVERYDAY AT BEDTIME (Patient taking differently: Take 20 mg by mouth at bedtime.) 90 tablet 2  . diazepam (VALIUM) 2 MG tablet Take 0.5 tablets (1 mg total) by mouth every 12 (twelve) hours as needed (vertigo). (Patient not taking: Reported on 08/28/2020) 15 tablet 1  . diclofenac  Sodium (VOLTAREN) 1 % GEL Apply 2 g topically 4 (four) times daily. (Patient not taking: Reported on 08/28/2020) 100 g 0    Inpatient Medications    Scheduled Meds: . amLODipine  5 mg Oral Daily  . arformoterol  15 mcg Nebulization BID  . aspirin  81 mg Oral Daily  . budesonide  0.5 mg Nebulization BH-qamhs  . clopidogrel  75 mg Oral Daily  . diclofenac Sodium  2 g Topical QID  . enoxaparin (LOVENOX) injection  40 mg Subcutaneous Q24H  . gabapentin  100 mg Oral QHS  . levothyroxine  75 mcg Oral Q0600  . loratadine  10 mg Oral Daily  . mouth rinse  15 mL Mouth Rinse BID  . metoprolol succinate  25 mg Oral Daily  . pantoprazole  40 mg Oral Daily  . predniSONE  20 mg Oral Q breakfast  . simvastatin  20 mg Oral q1800  . sodium chloride flush  3 mL Intravenous Q12H    Continuous Infusions: . sodium chloride 250 mL (08/30/20 0051)    PRN Meds: sodium chloride, acetaminophen, albuterol, diazepam, docusate sodium, fluticasone, guaiFENesin-dextromethorphan, sodium chloride flush   ALLERGIES   Allergies  Allergen Reactions  . Meclizine Nausea And Vomiting    ROS   Pertinent items noted in HPI and remainder of comprehensive ROS  otherwise negative.  Vitals   Vitals:   08/30/20 0849 08/30/20 0853 08/30/20 0905 08/30/20 1200  BP:    116/65  Pulse:    67  Resp:    (!) 23  Temp:    97.9 F (36.6 C)  TempSrc:    Oral  SpO2: 94% 96% (!) 86% 91%  Weight:      Height:        Intake/Output Summary (Last 24 hours) at 08/30/2020 1531 Last data filed at 08/29/2020 2028 Gross per 24 hour  Intake 240 ml  Output 800 ml  Net -560 ml   Filed Weights   08/27/20 1433 08/28/20 1317  Weight: 58.8 kg 58.8 kg    Physical Exam   General appearance: alert and no distress Neck: no carotid bruit, no JVD and thyroid not enlarged, symmetric, no tenderness/mass/nodules Lungs: clear to auscultation bilaterally Heart: regular rate and rhythm Abdomen: soft, non-tender; bowel sounds normal; no masses,  no organomegaly Extremities: extremities normal, atraumatic, no cyanosis or edema Pulses: 2+ and symmetric Skin: Skin color, texture, turgor normal. No rashes or lesions Neurologic: Grossly normal Psych: Pleasant  Labs   Results for orders placed or performed during the hospital encounter of 08/27/20 (from the past 48 hour(s))  CBC     Status: Abnormal   Collection Time: 08/29/20  2:48 AM  Result Value Ref Range   WBC 15.3 (H) 4.0 - 10.5 K/uL   RBC 3.82 (L) 3.87 - 5.11 MIL/uL   Hemoglobin 11.7 (L) 12.0 - 15.0 g/dL   HCT 36.8 36.0 - 46.0 %   MCV 96.3 80.0 - 100.0 fL   MCH 30.6 26.0 - 34.0 pg   MCHC 31.8 30.0 - 36.0 g/dL   RDW 14.2 11.5 - 15.5 %   Platelets 257 150 - 400 K/uL   nRBC 0.0 0.0 - 0.2 %    Comment: Performed at [facility], [place] [address], [place], [place] [address]  Basic metabolic panel     Status: Abnormal   Collection Time: 08/29/20  2:48 AM  Result Value Ref Range   Sodium 138 135 - 145 mmol/L   Potassium 4.5 3.5 -  5.1 mmol/L   Chloride 109 98 - 111 mmol/L   CO2 21 (L) 22 - 32 mmol/L   Glucose, Bld 132 (H) 70 - 99 mg/dL    Comment: Glucose reference range applies only to samples taken after  fasting for at least 8 hours.   BUN 24 (H) 8 - 23 mg/dL   Creatinine, Ser 1.02 (H) 0.44 - 1.00 mg/dL   Calcium 9.5 8.9 - 10.3 mg/dL   GFR, Estimated 55 (L) >60 mL/min    Comment: (NOTE) Calculated using the CKD-EPI Creatinine Equation (2021)    Anion gap 8 5 - 15    Comment: Performed at [facility] [address], [place], [place] [address]  Basic metabolic panel     Status: Abnormal   Collection Time: 08/30/20  9:36 AM  Result Value Ref Range   Sodium 138 135 - 145 mmol/L   Potassium 3.8 3.5 - 5.1 mmol/L   Chloride 109 98 - 111 mmol/L   CO2 20 (L) 22 - 32 mmol/L   Glucose, Bld 118 (H) 70 - 99 mg/dL    Comment: Glucose reference range applies only to samples taken after fasting for at least 8 hours.   BUN 31 (H) 8 - 23 mg/dL   Creatinine, Ser 1.02 (H) 0.44 - 1.00 mg/dL   Calcium 9.3 8.9 - 10.3 mg/dL   GFR, Estimated 55 (L) >60 mL/min    Comment: (NOTE) Calculated using the CKD-EPI Creatinine Equation (2021)    Anion gap 9 5 - 15    Comment: Performed at [facility] [address], [place], [place] [address]  Brain natriuretic peptide     Status: Abnormal   Collection Time: 08/30/20  9:36 AM  Result Value Ref Range   B Natriuretic Peptide 107.9 (H) 0.0 - 100.0 pg/mL    Comment: Performed at [facility] [address], [place], [place] [address]    ECG   Sinus tachycardia at 113- Personally Reviewed  Telemetry   Sinus tachycardia- Personally Reviewed  Radiology   ECHOCARDIOGRAM COMPLETE  Result Date: 08/29/2020    ECHOCARDIOGRAM REPORT   Patient Name:   KENNEDEE KITZMILLER Date of Exam: 08/29/2020 Medical Rec #:  [id number]       Height:       62.0 in Accession #:    [id number]      Weight:       129.6 lb Date of Birth:  08/28/1940       BSA:          1.590 m Patient Age:    14 years        BP:           127/66 mmHg Patient Gender: F               HR:           84 bpm. Exam Location:  Inpatient Procedure: 2D Echo, Cardiac Doppler, Color Doppler and  Intracardiac            Opacification Agent Indications:    Dyspnea  History:        Patient has prior history of Echocardiogram examinations, most                 recent 12/26/2019. COPD, Signs/Symptoms:Shortness of Breath; Risk                 Factors:Hypertension.  Sonographer:    Clayton Lefort RDCS (AE) Referring Phys: [id number]  JESSICA U VANN IMPRESSIONS  1. Left ventricular ejection fraction, by estimation, is 60 to 65%. The left ventricle has normal function. The left ventricle has no regional wall motion abnormalities. There is mild concentric left ventricular hypertrophy. Left ventricular diastolic parameters are consistent with Grade I diastolic dysfunction (impaired relaxation).  2. Right ventricular systolic function is mildly reduced. The right ventricular size is moderately enlarged. There is severely elevated pulmonary artery systolic pressure. The estimated right ventricular systolic pressure is 18.2 mmHg.  3. The mitral valve is normal in structure. No evidence of mitral valve regurgitation. No evidence of mitral stenosis.  4. The aortic valve is tricuspid. There is mild calcification of the aortic valve. There is mild thickening of the aortic valve. Aortic valve regurgitation is not visualized. Mild aortic valve sclerosis is present, with no evidence of aortic valve stenosis. Comparison(s): Compared to prior TTE in 12/2019, there is severe pulmonary HTN with PASP 78mHg. FINDINGS  Left Ventricle: Left ventricular ejection fraction, by estimation, is 60 to 65%. The left ventricle has normal function. The left ventricle has no regional wall motion abnormalities. Definity contrast agent was given IV to delineate the left ventricular  endocardial borders. The left ventricular internal cavity size was normal in size. There is mild concentric left ventricular hypertrophy. Left ventricular diastolic parameters are consistent with Grade I diastolic dysfunction (impaired relaxation). Right Ventricle: The right  ventricular size is moderately enlarged. Right vetricular wall thickness was not well visualized. Right ventricular systolic function is mildly reduced. There is severely elevated pulmonary artery systolic pressure. The tricuspid regurgitant velocity is 4.04 m/s, and with an assumed right atrial pressure of 8 mmHg, the estimated right ventricular systolic pressure is 799.3mmHg. Left Atrium: Left atrial size was normal in size. Right Atrium: Right atrial size was normal in size. Pericardium: There is no evidence of pericardial effusion. Mitral Valve: The mitral valve is normal in structure. There is mild thickening of the mitral valve leaflet(s). There is mild calcification of the mitral valve leaflet(s). No evidence of mitral valve regurgitation. No evidence of mitral valve stenosis. MV peak gradient, 2.6 mmHg. The mean mitral valve gradient is 1.0 mmHg. Tricuspid Valve: The tricuspid valve is normal in structure. Tricuspid valve regurgitation is trivial. Aortic Valve: The aortic valve is tricuspid. There is mild calcification of the aortic valve. There is mild thickening of the aortic valve. Aortic valve regurgitation is not visualized. Mild aortic valve sclerosis is present, with no evidence of aortic valve stenosis. Aortic valve mean gradient measures 2.0 mmHg. Aortic valve peak gradient measures 4.2 mmHg. Aortic valve area, by VTI measures 2.19 cm. Pulmonic Valve: The pulmonic valve was normal in structure. Pulmonic valve regurgitation is trivial. Aorta: The aortic root and ascending aorta are structurally normal, with no evidence of dilitation. IAS/Shunts: No atrial level shunt detected by color flow Doppler.  LEFT VENTRICLE PLAX 2D LVIDd:         4.10 cm  Diastology LVIDs:         2.90 cm  LV e' medial:    7.29 cm/s LV PW:         1.30 cm  LV E/e' medial:  8.2 LV IVS:        1.40 cm  LV e' lateral:   8.92 cm/s LVOT diam:     1.90 cm  LV E/e' lateral: 6.7 LV SV:         42 LV SV Index:   26 LVOT Area:     2.84  cm  RIGHT VENTRICLE            IVC RV Basal diam:  3.00 cm    IVC diam: 2.20 cm RV S prime:     8.70 cm/s TAPSE (M-mode): 1.3 cm LEFT ATRIUM             Index       RIGHT ATRIUM           Index LA diam:        2.00 cm 1.26 cm/m  RA Area:     13.60 cm LA Vol (A2C):   30.1 ml 18.93 ml/m RA Volume:   33.70 ml  21.20 ml/m LA Vol (A4C):   21.4 ml 13.46 ml/m LA Biplane Vol: 25.5 ml 16.04 ml/m  AORTIC VALVE AV Area (Vmax):    2.03 cm AV Area (Vmean):   2.19 cm AV Area (VTI):     2.19 cm AV Vmax:           103.00 cm/s AV Vmean:          74.100 cm/s AV VTI:            0.192 m AV Peak Grad:      4.2 mmHg AV Mean Grad:      2.0 mmHg LVOT Vmax:         73.80 cm/s LVOT Vmean:        57.300 cm/s LVOT VTI:          0.148 m LVOT/AV VTI ratio: 0.77  AORTA Ao Root diam: 3.40 cm Ao Asc diam:  3.10 cm MITRAL VALVE               TRICUSPID VALVE MV Area (PHT): 2.76 cm    TR Peak grad:   65.3 mmHg MV Area VTI:   2.27 cm    TR Vmax:        404.00 cm/s MV Peak grad:  2.6 mmHg MV Mean grad:  1.0 mmHg    SHUNTS MV Vmax:       0.81 m/s    Systemic VTI:  0.15 m MV Vmean:      48.1 cm/s   Systemic Diam: 1.90 cm MV Decel Time: 275 msec MV E velocity: 59.55 cm/s MV A velocity: 68.60 cm/s MV E/A ratio:  0.87 Gwyndolyn Kaufman MD Electronically signed by Gwyndolyn Kaufman MD Signature Date/Time: 08/29/2020/5:44:55 PM    Final     Cardiac Studies   See echo above  Impression   Principal Problem:   Acute on chronic respiratory failure with hypoxia (HCC) Active Problems:   Essential hypertension   Hypothyroidism   Pulmonary hypertension (HCC)   COPD with acute exacerbation (HCC)   Recommendation   1. This is an 80 year old female with worsening pulmonary hypertension and acute on chronic respiratory failure with hypoxia.  Echo shows now likely severe pulmonary hypertension, but fortunately LV function is preserved.  Cardiology is asked to evaluate for right heart catheterization.  I agree this would be helpful in further  determine the etiology of her pulmonary hypertension as well as evaluating for possible response to therapies.  We will work to try to arrange a right heart catheterization hopefully Monday afternoon. Agree with high-res chest CT and further evaluation as to the etiology of her pulmonary hypertension.  Thanks for the consultation.  Cardiology will follow with you  Time Spent Directly with Patient:  I have spent a total of 45 minutes with the patient reviewing hospital notes, telemetry, EKGs,  labs and examining the patient as well as establishing an assessment and plan that was discussed personally with the patient.  > 50% of time was spent in direct patient care.  Length of Stay:  LOS: 3 days   Pixie Casino, MD, [facility], [place] Director of the Advanced Lipid Disorders &  Cardiovascular Risk Reduction Clinic Diplomate of the American Board of Clinical Lipidology Attending Cardiologist  Direct Dial: [phone]  Fax: [phone]  Website:  [email or web address] 08/30/2020, 3:31 PM

## 2020-08-30 NOTE — Consult Note (Signed)
NAME:  [name], MRN:  [id number], DOB:  Jun 10, 1940, LOS: 3 ADMISSION DATE:  08/27/2020, CONSULTATION DATE:  08/30/2020 REFERRING MD: Eulogio Bear, CHIEF COMPLAINT: Respiratory failure, pulmonary hypertension  History of Present Illness:  80 year old with COPD, hypertension, pulmonary hypertension, hypothyroidism, chronic respiratory failure on home oxygen  Admitted with worsening dyspnea.  She was prescribed Augmentin and prednisone as an outpatient but got admitted with progressive symptoms.  PCCM consulted for escalating oxygen requirements and echo showing worsening pulmonary hypertension  Pertinent  Medical History    has a past medical history of Allergy, Arthritis, Carotid artery occlusion, COPD (chronic obstructive pulmonary disease) ([place]), GERD (gastroesophageal reflux disease), Hypertension, Joint pain, and Thyroid disease.  Significant Hospital Events: Including procedures, antibiotic start and stop dates in addition to other pertinent events   . 5/18-admit  Interim History / Subjective:    Objective   Blood pressure 116/65, pulse 67, temperature 97.9 F (36.6 C), temperature source Oral, resp. rate (!) 23, height _0  (1.575 m), weight 58.8 kg, SpO2 91 %.        Intake/Output Summary (Last 24 hours) at 08/30/2020 1336 Last data filed at 08/29/2020 2028 Gross per 24 hour  Intake 480 ml  Output 800 ml  Net -320 ml   Filed Weights   08/27/20 1433 08/28/20 1317  Weight: 58.8 kg 58.8 kg    Examination: Blood pressure 116/65, pulse 67, temperature 97.9 F (36.6 C), temperature source Oral, resp. rate (!) 23, height _1  (1.575 m), weight 58.8 kg, SpO2 91 %. Gen:      No acute distress HEENT:  EOMI, sclera anicteric Neck:     No masses; no thyromegaly Lungs:    Clear to auscultation bilaterally; normal respiratory effort CV:         Regular rate and rhythm; no murmurs Abd:      + bowel sounds; soft, non-tender; no palpable masses, no distension Ext:    No  edema; adequate peripheral perfusion Skin:      Warm and dry; no rash Neuro: alert and oriented x 3 Psych: normal mood and affect  Labs/imaging that I havepersonally reviewed  (right click and "Reselect all SmartList Selections" daily)   CTA 5/18-no pulmonary embolism mild emphysema, distal atelectasis and scattered mediastinal and hilar lymphadenopathy that is stable.  Echocardiogram 08/29/2020- LVEF 60 to 65%, mild concentric LVH, grade 1 diastolic dysfunction.  44-year-old pulmonary hypertension, estimated RVSP 73.3  Outpatient work-up Sleep study 03/19/2020- AHI 5.5 with desats  VQ scan 12/27/2019-low probability for PE  Lower extremity duplex 12/27/19 negative for DVT  Autoimmune serologies 12/27/2019-negative  Unable to complete PFTs  Resolved Hospital Problem list     Assessment & Plan:  Severe pulmonary hypertension  Pulmonary hypertension etiology is unclear She does have mild emphysema and smoking history, diastolic dysfunction and mild sleep apnea but not enough to explain the degree of pulmonary hypertension  CT does not show ILD, autoimmune serologies are negative and no thromboembolic disease.  Prior CTs are suggestive of sarcoidosis but not convincing  I will order high-resolution CT for better evaluation. Consult cardiology for right heart catheterization as this could be primary pulmonary arterial hypertension No clear evidence of COPD exacerbation as there is no wheeze.  Okay to continue prednisone and bronchodilators for now pending further evaluation  Best practice (right click and "Reselect all SmartList Selections" daily)  Diet:  Oral Pain/Anxiety/Delirium protocol (if indicated): No VAP protocol (if indicated): Not indicated DVT prophylaxis: LMWH GI prophylaxis: PPI Glucose  control:  SSI No Central venous access:  N/A Arterial line:  N/A Foley:  N/A Mobility:  OOB  PT consulted: N/A Last date of multidisciplinary goals of care discussion _0  Code  Status:  full code Disposition: PCU  Labs   CBC: Recent Labs  Lab 08/27/20 1451 08/29/20 0248  WBC 12.3* 15.3*  NEUTROABS 10.2*  --   HGB 12.4 11.7*  HCT 39.0 36.8  MCV 99.2 96.3  PLT 247 098    Basic Metabolic Panel: Recent Labs  Lab 08/27/20 1451 08/29/20 0248 08/30/20 0936  NA 139 138 138  K 4.1 4.5 3.8  CL 113* 109 109  CO2 15* 21* 20*  GLUCOSE 130* 132* 118*  BUN 9 24* 31*  CREATININE 0.79 1.02* 1.02*  CALCIUM 9.0 9.5 9.3   GFR: Estimated Creatinine Clearance: 34.2 mL/min (A) (by C-G formula based on SCr of 1.02 mg/dL (H)). Recent Labs  Lab 08/27/20 1451 08/28/20 1406 08/29/20 0248  PROCALCITON  --  <0.10  --   WBC 12.3*  --  15.3*    Liver Function Tests: No results for input(s): AST, ALT, ALKPHOS, BILITOT, PROT, ALBUMIN in the last 168 hours. No results for input(s): LIPASE, AMYLASE in the last 168 hours. No results for input(s): AMMONIA in the last 168 hours.  ABG    Component Value Date/Time   PHART 7.409 12/25/2019 2218   PCO2ART 30.1 (L) 12/25/2019 2218   PO2ART 73 (L) 12/25/2019 2218   HCO3 19.0 (L) 12/25/2019 2218   TCO2 20 (L) 12/25/2019 2218   ACIDBASEDEF 4.0 (H) 12/25/2019 2218   O2SAT 95.0 12/25/2019 2218     Coagulation Profile: No results for input(s): INR, PROTIME in the last 168 hours.  Cardiac Enzymes: No results for input(s): CKTOTAL, CKMB, CKMBINDEX, TROPONINI in the last 168 hours.  HbA1C: Hgb A1c MFr Bld  Date/Time Value Ref Range Status  12/26/2019 07:09 AM 5.7 (H) 4.8 - 5.6 % Final    Comment:    (NOTE) Pre diabetes:          5.7%-6.4%  Diabetes:              >6.4%  Glycemic control for   <7.0% adults with diabetes   04/17/2008 10:40 PM (H) 4.6 - 6.1 % Final   6.2 (NOTE)   The ADA recommends the following therapeutic goal for glycemic   control related to Hgb A1C measurement:   Goal of Therapy:   < 7.0% Hgb A1C   Reference: American Diabetes Association: Clinical Practice   Recommendations 2008, Diabetes  Care,  2008, 31:(Suppl 1).    CBG: No results for input(s): GLUCAP in the last 168 hours.  Review of Systems:    REVIEW OF SYSTEMS:   All negative; except for those that are bolded, which indicate positives.  Constitutional: weight loss, weight gain, night sweats, fevers, chills, fatigue, weakness.  HEENT: headaches, sore throat, sneezing, nasal congestion, post nasal drip, difficulty swallowing, tooth/dental problems, visual complaints, visual changes, ear aches. Neuro: difficulty with speech, weakness, numbness, ataxia. CV:  chest pain, orthopnea, PND, swelling in lower extremities, dizziness, palpitations, syncope.  Resp: cough, hemoptysis, dyspnea, wheezing. GI: heartburn, indigestion, abdominal pain, nausea, vomiting, diarrhea, constipation, change in bowel habits, loss of appetite, hematemesis, melena, hematochezia.  GU: dysuria, change in color of urine, urgency or frequency, flank pain, hematuria. MSK: joint pain or swelling, decreased range of motion. Psych: change in mood or affect, depression, anxiety, suicidal ideations, homicidal ideations. Skin: rash, itching, bruising.  Past Medical  History:  She,  has a past medical history of Allergy, Arthritis, Carotid artery occlusion, COPD (chronic obstructive pulmonary disease) ([place]), GERD (gastroesophageal reflux disease), Hypertension, Joint pain, and Thyroid disease.   Surgical History:   Past Surgical History:  Procedure Laterality Date  . ABDOMINAL HYSTERECTOMY    . AORTIC ARCH ANGIOGRAPHY N/A 03/01/2017   Procedure: AORTIC ARCH ANGIOGRAPHY;  Surgeon: Serafina [name], MD;  Location: [facility];  Service: Cardiovascular;  Laterality: N/A;  . ARCH AORTOGRAM N/A 05/04/2011   Procedure: ARCH AORTOGRAM;  Surgeon: Serafina [name], MD;  Location[facility] CATH LAB;  Service: Cardiovascular;  Laterality: N/A;  . ARCH AORTOGRAM N/A 07/04/2012   Procedure: ARCH AORTOGRAM;  Surgeon: Serafina [name], MD;  Location: [facility] CATH LAB;   Service: Cardiovascular;  Laterality: N/A;  . CAROTID ANGIOGRAM  July 04, 2012  . CAROTID ANGIOGRAM N/A 05/04/2011   Procedure: CAROTID ANGIOGRAM;  Surgeon: Serafina [name], MD;  Location: [facility] CATH LAB;  Service: Cardiovascular;  Laterality: N/A;  . CAROTID ANGIOGRAM N/A 07/04/2012   Procedure: CAROTID ANGIOGRAM;  Surgeon: Serafina [name], MD;  Location: [facility] CATH LAB;  Service: Cardiovascular;  Laterality: N/A;  . CAROTID STENT INSERTION Right 05/11/2011   Procedure: CAROTID STENT INSERTION;  Surgeon: Serafina [name], MD;  Location: [facility] CATH LAB;  Service: Cardiovascular;  Laterality: Right;  . carotid subclavian anastomosis  05/11/11   Right subclav. artery transected 12/17/09  . CATARACT EXTRACTION     bilateral  . EYE SURGERY       Social History:   reports that she quit smoking about 40 years ago. Her smoking use included cigarettes. She has a 1.50 pack-year smoking history. She has never used smokeless tobacco. She reports that she does not drink alcohol and does not use drugs.   Family History:  Her family history includes Cancer in her father; Heart failure in her mother. There is no history of Breast cancer.   Allergies Allergies  Allergen Reactions  . Meclizine Nausea And Vomiting     Home Medications  Prior to Admission medications   Medication Sig Start Date End Date Taking? Authorizing Provider  acetaminophen (TYLENOL) 325 MG tablet Take 650 mg every 6 (six) hours as needed by mouth for mild pain or headache.    Yes [provider]  ADVAIR HFA 115-21 MCG/ACT inhaler Inhale 2 puffs into the lungs 2 (two) times daily. 08/24/20  Yes [provider]  albuterol (PROVENTIL) (2.5 MG/3ML) 0.083% nebulizer solution Take 3 mLs (2.5 mg total) by nebulization every 6 (six) hours as needed for wheezing or shortness of breath. 08/15/20  Yes Olalere, Adewale A, MD  albuterol (VENTOLIN HFA) 108 (90 Base) MCG/ACT inhaler TAKE 2 PUFFS BY MOUTH EVERY 6 HOURS AS NEEDED FOR WHEEZE OR  SHORTNESS OF BREATH Patient taking differently: Inhale 2 puffs into the lungs every 6 (six) hours as needed for wheezing or shortness of breath. 08/21/20  Yes [name], MD  amLODipine (NORVASC) 5 MG tablet TAKE 1 TABLET BY MOUTH EVERY DAY Patient taking differently: Take 5 mg by mouth daily. 07/27/20  Yes [name], MD  amoxicillin-clavulanate (AUGMENTIN) 500-125 MG tablet Take 1 tablet (500 mg total) by mouth 2 (two) times daily. 08/26/20  Yes [name], MD  arformoterol (BROVANA) 15 MCG/2ML NEBU Take 2 mLs (15 mcg total) by nebulization 2 (two) times daily. 08/15/20  Yes Olalere, Adewale A, MD  aspirin 81 MG chewable tablet Chew 81 mg by mouth daily.  Yes [provider]  budesonide (PULMICORT) 0.5 MG/2ML nebulizer solution Take 2 mLs (0.5 mg total) by nebulization in the morning and at bedtime. 08/15/20  Yes Olalere, Adewale A, MD  calcium-vitamin D (OSCAL WITH D) 500-200 MG-UNIT per tablet Take 1 tablet by mouth daily with breakfast.   Yes [provider]  cetirizine (ZYRTEC) 10 MG tablet Take 10 mg daily as needed by mouth for allergies.    Yes [provider]  clopidogrel (PLAVIX) 75 MG tablet TAKE 1 TABLET BY MOUTH EVERY DAY Patient taking differently: Take 75 mg by mouth daily. 08/11/20  Yes [name], MD  docusate sodium ([employer] STOOL SOFTENER) 100 MG capsule TAKE 1 CAPSULE (100 MG TOTAL) BY MOUTH DAILY AS NEEDED FOR MILD CONSTIPATION. Patient taking differently: Take 100 mg by mouth daily as needed for mild constipation. TAKE 1 CAPSULE (100 MG TOTAL) BY MOUTH DAILY AS NEEDED FOR MILD CONSTIPATION. 01/15/20  Yes [name], MD  fluticasone (FLONASE) 50 MCG/ACT nasal spray Place 1 spray into both nostrils daily as needed for allergies or rhinitis (for sinus congestion). 01/15/20  Yes [name], MD  gabapentin (NEURONTIN) 100 MG capsule TAKE 1 CAPSULE (100 MG TOTAL) BY MOUTH AT BEDTIME. 05/30/19  Yes [name], MD   guaiFENesin-dextromethorphan (ROBITUSSIN DM) 100-10 MG/5ML syrup Take 10 mLs by mouth every 4 (four) hours as needed for cough. 05/04/20  Yes Gherghe, Vella Redhead, MD  latanoprost (XALATAN) 0.005 % ophthalmic solution Place 1 drop at bedtime into both eyes. 01/15/17  Yes [provider]  levothyroxine (SYNTHROID) 50 MCG tablet Take 1.5 tablets (75 mcg total) by mouth daily. 07/28/20  Yes [name], MD  lisinopril (ZESTRIL) 20 MG tablet TAKE 1 TABLET BY MOUTH EVERY DAY Patient taking differently: Take 20 mg by mouth daily. 08/23/20  Yes [name], MD  metoprolol succinate (TOPROL-XL) 25 MG 24 hr tablet TAKE 1 TABLET BY MOUTH EVERY DAY Patient taking differently: Take 25 mg by mouth daily. 09/24/19  Yes [name], MD  Multiple Vitamins-Minerals (ALIVE WOMENS ENERGY) TABS Take 1 tablet by mouth daily.   Yes [provider]  predniSONE (DELTASONE) 20 MG tablet Take 1 tablet (20 mg total) by mouth daily with breakfast. 08/26/20  Yes [name], MD  Probiotic Product (PROBIOTIC PO) Take 1 capsule by mouth daily.   Yes [provider]  simvastatin (ZOCOR) 20 MG tablet TAKE 1 TABLET BY MOUTH EVERYDAY AT BEDTIME Patient taking differently: Take 20 mg by mouth at bedtime. 08/20/20  Yes [name], MD  diazepam (VALIUM) 2 MG tablet Take 0.5 tablets (1 mg total) by mouth every 12 (twelve) hours as needed (vertigo). Patient not taking: Reported on 08/28/2020 01/15/20   [name], MD  diclofenac Sodium (VOLTAREN) 1 % GEL Apply 2 g topically 4 (four) times daily. Patient not taking: Reported on 08/28/2020 11/25/19   Darr, Edison Nasuti, PA-C     Critical care time: NA   Marshell Garfinkel MD [facility] See Amion for pager  If no response to pager , please call [phone] until 7pm After 7:00 pm call Elink  [phone] 08/30/2020, 2:23 PM

## 2020-08-30 NOTE — H&P (View-Only) (Signed)
CONSULTATION NOTE   Patient Name: [name] Date of Encounter: 08/30/2020 Cardiologist: None Electrophysiologist: None Advanced Heart Failure: None   Chief Complaint   Shortness of breath  Patient Profile   80 yo female with primary pulmonary hypertension, presents with progressive dyspnea on exertion, seen by pulmonary who requests a right heart cath  HPI   [name] is a 80 y.o. female who is being seen today for the evaluation of pulmonary hypertension at the request of Dr. Eliseo Squires. This is a 80 year old female with pulmonary hypertension followed by [name].  Echo in 2021 showed normal LVEF 60 to 65% with elevated RVSP of 60 mmHg.  She presents now with progressive shortness of breath and a repeat echo shows stable LVEF 60 to 65% but an increase in RVSP to 73 mmHg.  Pulmonary was consulted and suggested right heart catheterization.  Cardiology is asked to evaluate for suitability of right heart catheterization. She currently reports improvement in her dyspnea and no chest painl.  PMHx   Past Medical History:  Diagnosis Date  . Allergy   . Arthritis   . Carotid artery occlusion   . COPD (chronic obstructive pulmonary disease) ([place])   . GERD (gastroesophageal reflux disease)   . Hypertension   . Joint pain   . Thyroid disease     Past Surgical History:  Procedure Laterality Date  . ABDOMINAL HYSTERECTOMY    . AORTIC ARCH ANGIOGRAPHY N/A 03/01/2017   Procedure: AORTIC ARCH ANGIOGRAPHY;  Surgeon: Serafina [name], MD;  Location: [facility] LAB;  Service: Cardiovascular;  Laterality: N/A;  . ARCH AORTOGRAM N/A 05/04/2011   Procedure: ARCH AORTOGRAM;  Surgeon: Serafina [name], MD;  Location: [facility] CATH LAB;  Service: Cardiovascular;  Laterality: N/A;  . ARCH AORTOGRAM N/A 07/04/2012   Procedure: ARCH AORTOGRAM;  Surgeon: Serafina [name], MD;  Location: [facility] CATH LAB;  Service: Cardiovascular;  Laterality: N/A;  . CAROTID ANGIOGRAM  July 04, 2012  . CAROTID  ANGIOGRAM N/A 05/04/2011   Procedure: CAROTID ANGIOGRAM;  Surgeon: Serafina [name], MD;  Location: [facility] CATH LAB;  Service: Cardiovascular;  Laterality: N/A;  . CAROTID ANGIOGRAM N/A 07/04/2012   Procedure: CAROTID ANGIOGRAM;  Surgeon: Serafina [name], MD;  Location: [facility] CATH LAB;  Service: Cardiovascular;  Laterality: N/A;  . CAROTID STENT INSERTION Right 05/11/2011   Procedure: CAROTID STENT INSERTION;  Surgeon: Serafina [name], MD;  Location: [facility] CATH LAB;  Service: Cardiovascular;  Laterality: Right;  . carotid subclavian anastomosis  05/11/11   Right subclav. artery transected 12/17/09  . CATARACT EXTRACTION     bilateral  . EYE SURGERY      FAMHx   Family History  Problem Relation Age of Onset  . Heart failure Mother   . Cancer Father   . Breast cancer Neg Hx     SOCHx    reports that she quit smoking about 40 years ago. Her smoking use included cigarettes. She has a 1.50 pack-year smoking history. She has never used smokeless tobacco. She reports that she does not drink alcohol and does not use drugs.  Outpatient Medications   No current facility-administered medications on file prior to encounter.   Current Outpatient Medications on File Prior to Encounter  Medication Sig Dispense Refill  . acetaminophen (TYLENOL) 325 MG tablet Take 650 mg every 6 (six) hours as needed by mouth for mild pain or headache.     [name] HFA 294-76 MCG/ACT inhaler Inhale 2 puffs into  the lungs 2 (two) times daily.    Marland Kitchen albuterol (PROVENTIL) (2.5 MG/3ML) 0.083% nebulizer solution Take 3 mLs (2.5 mg total) by nebulization every 6 (six) hours as needed for wheezing or shortness of breath. 360 mL 11  . albuterol (VENTOLIN HFA) 108 (90 Base) MCG/ACT inhaler TAKE 2 PUFFS BY MOUTH EVERY 6 HOURS AS NEEDED FOR WHEEZE OR SHORTNESS OF BREATH (Patient taking differently: Inhale 2 puffs into the lungs every 6 (six) hours as needed for wheezing or shortness of breath.) 18 each 2  . amLODipine (NORVASC) 5 MG tablet  TAKE 1 TABLET BY MOUTH EVERY DAY (Patient taking differently: Take 5 mg by mouth daily.) 90 tablet 0  . amoxicillin-clavulanate (AUGMENTIN) 500-125 MG tablet Take 1 tablet (500 mg total) by mouth 2 (two) times daily. 10 tablet 0  . arformoterol (BROVANA) 15 MCG/2ML NEBU Take 2 mLs (15 mcg total) by nebulization 2 (two) times daily. 120 mL 11  . aspirin 81 MG chewable tablet Chew 81 mg by mouth daily.    . budesonide (PULMICORT) 0.5 MG/2ML nebulizer solution Take 2 mLs (0.5 mg total) by nebulization in the morning and at bedtime. 120 mL 11  . calcium-vitamin D (OSCAL WITH D) 500-200 MG-UNIT per tablet Take 1 tablet by mouth daily with breakfast.    . cetirizine (ZYRTEC) 10 MG tablet Take 10 mg daily as needed by mouth for allergies.     Marland Kitchen clopidogrel (PLAVIX) 75 MG tablet TAKE 1 TABLET BY MOUTH EVERY DAY (Patient taking differently: Take 75 mg by mouth daily.) 30 tablet 0  . docusate sodium ([employer] STOOL SOFTENER) 100 MG capsule TAKE 1 CAPSULE (100 MG TOTAL) BY MOUTH DAILY AS NEEDED FOR MILD CONSTIPATION. (Patient taking differently: Take 100 mg by mouth daily as needed for mild constipation. TAKE 1 CAPSULE (100 MG TOTAL) BY MOUTH DAILY AS NEEDED FOR MILD CONSTIPATION.) 30 capsule 2  . fluticasone (FLONASE) 50 MCG/ACT nasal spray Place 1 spray into both nostrils daily as needed for allergies or rhinitis (for sinus congestion). 48 mL 3  . gabapentin (NEURONTIN) 100 MG capsule TAKE 1 CAPSULE (100 MG TOTAL) BY MOUTH AT BEDTIME. 90 capsule 1  . guaiFENesin-dextromethorphan (ROBITUSSIN DM) 100-10 MG/5ML syrup Take 10 mLs by mouth every 4 (four) hours as needed for cough. 118 mL 0  . latanoprost (XALATAN) 0.005 % ophthalmic solution Place 1 drop at bedtime into both eyes.    Marland Kitchen levothyroxine (SYNTHROID) 50 MCG tablet Take 1.5 tablets (75 mcg total) by mouth daily. 45 tablet 1  . lisinopril (ZESTRIL) 20 MG tablet TAKE 1 TABLET BY MOUTH EVERY DAY (Patient taking differently: Take 20 mg by mouth daily.) 30 tablet 0   . metoprolol succinate (TOPROL-XL) 25 MG 24 hr tablet TAKE 1 TABLET BY MOUTH EVERY DAY (Patient taking differently: Take 25 mg by mouth daily.) 30 tablet 1  . Multiple Vitamins-Minerals (ALIVE WOMENS ENERGY) TABS Take 1 tablet by mouth daily.    . predniSONE (DELTASONE) 20 MG tablet Take 1 tablet (20 mg total) by mouth daily with breakfast. 5 tablet 0  . Probiotic Product (PROBIOTIC PO) Take 1 capsule by mouth daily.    . simvastatin (ZOCOR) 20 MG tablet TAKE 1 TABLET BY MOUTH EVERYDAY AT BEDTIME (Patient taking differently: Take 20 mg by mouth at bedtime.) 90 tablet 2  . diazepam (VALIUM) 2 MG tablet Take 0.5 tablets (1 mg total) by mouth every 12 (twelve) hours as needed (vertigo). (Patient not taking: Reported on 08/28/2020) 15 tablet 1  . diclofenac  Sodium (VOLTAREN) 1 % GEL Apply 2 g topically 4 (four) times daily. (Patient not taking: Reported on 08/28/2020) 100 g 0    Inpatient Medications    Scheduled Meds: . amLODipine  5 mg Oral Daily  . arformoterol  15 mcg Nebulization BID  . aspirin  81 mg Oral Daily  . budesonide  0.5 mg Nebulization BH-qamhs  . clopidogrel  75 mg Oral Daily  . diclofenac Sodium  2 g Topical QID  . enoxaparin (LOVENOX) injection  40 mg Subcutaneous Q24H  . gabapentin  100 mg Oral QHS  . levothyroxine  75 mcg Oral Q0600  . loratadine  10 mg Oral Daily  . mouth rinse  15 mL Mouth Rinse BID  . metoprolol succinate  25 mg Oral Daily  . pantoprazole  40 mg Oral Daily  . predniSONE  20 mg Oral Q breakfast  . simvastatin  20 mg Oral q1800  . sodium chloride flush  3 mL Intravenous Q12H    Continuous Infusions: . sodium chloride 250 mL (08/30/20 0051)    PRN Meds: sodium chloride, acetaminophen, albuterol, diazepam, docusate sodium, fluticasone, guaiFENesin-dextromethorphan, sodium chloride flush   ALLERGIES   Allergies  Allergen Reactions  . Meclizine Nausea And Vomiting    ROS   Pertinent items noted in HPI and remainder of comprehensive ROS  otherwise negative.  Vitals   Vitals:   08/30/20 0849 08/30/20 0853 08/30/20 0905 08/30/20 1200  BP:    116/65  Pulse:    67  Resp:    (!) 23  Temp:    97.9 F (36.6 C)  TempSrc:    Oral  SpO2: 94% 96% (!) 86% 91%  Weight:      Height:        Intake/Output Summary (Last 24 hours) at 08/30/2020 1531 Last data filed at 08/29/2020 2028 Gross per 24 hour  Intake 240 ml  Output 800 ml  Net -560 ml   Filed Weights   08/27/20 1433 08/28/20 1317  Weight: 58.8 kg 58.8 kg    Physical Exam   General appearance: alert and no distress Neck: no carotid bruit, no JVD and thyroid not enlarged, symmetric, no tenderness/mass/nodules Lungs: clear to auscultation bilaterally Heart: regular rate and rhythm Abdomen: soft, non-tender; bowel sounds normal; no masses,  no organomegaly Extremities: extremities normal, atraumatic, no cyanosis or edema Pulses: 2+ and symmetric Skin: Skin color, texture, turgor normal. No rashes or lesions Neurologic: Grossly normal Psych: Pleasant  Labs   Results for orders placed or performed during the hospital encounter of 08/27/20 (from the past 48 hour(s))  CBC     Status: Abnormal   Collection Time: 08/29/20  2:48 AM  Result Value Ref Range   WBC 15.3 (H) 4.0 - 10.5 K/uL   RBC 3.82 (L) 3.87 - 5.11 MIL/uL   Hemoglobin 11.7 (L) 12.0 - 15.0 g/dL   HCT 36.8 36.0 - 46.0 %   MCV 96.3 80.0 - 100.0 fL   MCH 30.6 26.0 - 34.0 pg   MCHC 31.8 30.0 - 36.0 g/dL   RDW 14.2 11.5 - 15.5 %   Platelets 257 150 - 400 K/uL   nRBC 0.0 0.0 - 0.2 %    Comment: Performed at [facility], [place] [address], [place], [place] [address]  Basic metabolic panel     Status: Abnormal   Collection Time: 08/29/20  2:48 AM  Result Value Ref Range   Sodium 138 135 - 145 mmol/L   Potassium 4.5 3.5 -  5.1 mmol/L   Chloride 109 98 - 111 mmol/L   CO2 21 (L) 22 - 32 mmol/L   Glucose, Bld 132 (H) 70 - 99 mg/dL    Comment: Glucose reference range applies only to samples taken after  fasting for at least 8 hours.   BUN 24 (H) 8 - 23 mg/dL   Creatinine, Ser 1.02 (H) 0.44 - 1.00 mg/dL   Calcium 9.5 8.9 - 10.3 mg/dL   GFR, Estimated 55 (L) >60 mL/min    Comment: (NOTE) Calculated using the CKD-EPI Creatinine Equation (2021)    Anion gap 8 5 - 15    Comment: Performed at [facility] [address] [place], [place] [address]  Basic metabolic panel     Status: Abnormal   Collection Time: 08/30/20  9:36 AM  Result Value Ref Range   Sodium 138 135 - 145 mmol/L   Potassium 3.8 3.5 - 5.1 mmol/L   Chloride 109 98 - 111 mmol/L   CO2 20 (L) 22 - 32 mmol/L   Glucose, Bld 118 (H) 70 - 99 mg/dL    Comment: Glucose reference range applies only to samples taken after fasting for at least 8 hours.   BUN 31 (H) 8 - 23 mg/dL   Creatinine, Ser 1.02 (H) 0.44 - 1.00 mg/dL   Calcium 9.3 8.9 - 10.3 mg/dL   GFR, Estimated 55 (L) >60 mL/min    Comment: (NOTE) Calculated using the CKD-EPI Creatinine Equation (2021)    Anion gap 9 5 - 15    Comment: Performed at [facility] [address], [place], [place] [address]  Brain natriuretic peptide     Status: Abnormal   Collection Time: 08/30/20  9:36 AM  Result Value Ref Range   B Natriuretic Peptide 107.9 (H) 0.0 - 100.0 pg/mL    Comment: Performed at [facility] [address] [place],  [address]    ECG   Sinus tachycardia at 113- Personally Reviewed  Telemetry   Sinus tachycardia- Personally Reviewed  Radiology   ECHOCARDIOGRAM COMPLETE  Result Date: 08/29/2020    ECHOCARDIOGRAM REPORT   Patient Name:   [name] Date of Exam: 08/29/2020 Medical Rec #:  [id number]       Height:       62.0 in Accession #:    [id number]      Weight:       129.6 lb Date of Birth:  Nov 29, 1940       BSA:          1.590 m Patient Age:    84 years        BP:           127/66 mmHg Patient Gender: F               HR:           84 bpm. Exam Location:  Inpatient Procedure: 2D Echo, Cardiac Doppler, Color Doppler and  Intracardiac            Opacification Agent Indications:    Dyspnea  History:        Patient has prior history of Echocardiogram examinations, most                 recent 12/26/2019. COPD, Signs/Symptoms:Shortness of Breath; Risk                 Factors:Hypertension.  Sonographer:    Clayton Lefort RDCS (AE) Referring Phys: [phone]  JESSICA U VANN IMPRESSIONS  1. Left ventricular ejection fraction, by estimation, is 60 to 65%. The left ventricle has normal function. The left ventricle has no regional wall motion abnormalities. There is mild concentric left ventricular hypertrophy. Left ventricular diastolic parameters are consistent with Grade I diastolic dysfunction (impaired relaxation).  2. Right ventricular systolic function is mildly reduced. The right ventricular size is moderately enlarged. There is severely elevated pulmonary artery systolic pressure. The estimated right ventricular systolic pressure is 83.3 mmHg.  3. The mitral valve is normal in structure. No evidence of mitral valve regurgitation. No evidence of mitral stenosis.  4. The aortic valve is tricuspid. There is mild calcification of the aortic valve. There is mild thickening of the aortic valve. Aortic valve regurgitation is not visualized. Mild aortic valve sclerosis is present, with no evidence of aortic valve stenosis. Comparison(s): Compared to prior TTE in 12/2019, there is severe pulmonary HTN with PASP 67mHg. FINDINGS  Left Ventricle: Left ventricular ejection fraction, by estimation, is 60 to 65%. The left ventricle has normal function. The left ventricle has no regional wall motion abnormalities. Definity contrast agent was given IV to delineate the left ventricular  endocardial borders. The left ventricular internal cavity size was normal in size. There is mild concentric left ventricular hypertrophy. Left ventricular diastolic parameters are consistent with Grade I diastolic dysfunction (impaired relaxation). Right Ventricle: The right  ventricular size is moderately enlarged. Right vetricular wall thickness was not well visualized. Right ventricular systolic function is mildly reduced. There is severely elevated pulmonary artery systolic pressure. The tricuspid regurgitant velocity is 4.04 m/s, and with an assumed right atrial pressure of 8 mmHg, the estimated right ventricular systolic pressure is 738.3mmHg. Left Atrium: Left atrial size was normal in size. Right Atrium: Right atrial size was normal in size. Pericardium: There is no evidence of pericardial effusion. Mitral Valve: The mitral valve is normal in structure. There is mild thickening of the mitral valve leaflet(s). There is mild calcification of the mitral valve leaflet(s). No evidence of mitral valve regurgitation. No evidence of mitral valve stenosis. MV peak gradient, 2.6 mmHg. The mean mitral valve gradient is 1.0 mmHg. Tricuspid Valve: The tricuspid valve is normal in structure. Tricuspid valve regurgitation is trivial. Aortic Valve: The aortic valve is tricuspid. There is mild calcification of the aortic valve. There is mild thickening of the aortic valve. Aortic valve regurgitation is not visualized. Mild aortic valve sclerosis is present, with no evidence of aortic valve stenosis. Aortic valve mean gradient measures 2.0 mmHg. Aortic valve peak gradient measures 4.2 mmHg. Aortic valve area, by VTI measures 2.19 cm. Pulmonic Valve: The pulmonic valve was normal in structure. Pulmonic valve regurgitation is trivial. Aorta: The aortic root and ascending aorta are structurally normal, with no evidence of dilitation. IAS/Shunts: No atrial level shunt detected by color flow Doppler.  LEFT VENTRICLE PLAX 2D LVIDd:         4.10 cm  Diastology LVIDs:         2.90 cm  LV e' medial:    7.29 cm/s LV PW:         1.30 cm  LV E/e' medial:  8.2 LV IVS:        1.40 cm  LV e' lateral:   8.92 cm/s LVOT diam:     1.90 cm  LV E/e' lateral: 6.7 LV SV:         42 LV SV Index:   26 LVOT Area:     2.84  cm  RIGHT VENTRICLE            IVC RV Basal diam:  3.00 cm    IVC diam: 2.20 cm RV S prime:     8.70 cm/s TAPSE (M-mode): 1.3 cm LEFT ATRIUM             Index       RIGHT ATRIUM           Index LA diam:        2.00 cm 1.26 cm/m  RA Area:     13.60 cm LA Vol (A2C):   30.1 ml 18.93 ml/m RA Volume:   33.70 ml  21.20 ml/m LA Vol (A4C):   21.4 ml 13.46 ml/m LA Biplane Vol: 25.5 ml 16.04 ml/m  AORTIC VALVE AV Area (Vmax):    2.03 cm AV Area (Vmean):   2.19 cm AV Area (VTI):     2.19 cm AV Vmax:           103.00 cm/s AV Vmean:          74.100 cm/s AV VTI:            0.192 m AV Peak Grad:      4.2 mmHg AV Mean Grad:      2.0 mmHg LVOT Vmax:         73.80 cm/s LVOT Vmean:        57.300 cm/s LVOT VTI:          0.148 m LVOT/AV VTI ratio: 0.77  AORTA Ao Root diam: 3.40 cm Ao Asc diam:  3.10 cm MITRAL VALVE               TRICUSPID VALVE MV Area (PHT): 2.76 cm    TR Peak grad:   65.3 mmHg MV Area VTI:   2.27 cm    TR Vmax:        404.00 cm/s MV Peak grad:  2.6 mmHg MV Mean grad:  1.0 mmHg    SHUNTS MV Vmax:       0.81 m/s    Systemic VTI:  0.15 m MV Vmean:      48.1 cm/s   Systemic Diam: 1.90 cm MV Decel Time: 275 msec MV E velocity: 59.55 cm/s MV A velocity: 68.60 cm/s MV E/A ratio:  0.87 Gwyndolyn Kaufman MD Electronically signed by Gwyndolyn Kaufman MD Signature Date/Time: 08/29/2020/5:44:55 PM    Final     Cardiac Studies   See echo above  Impression   Principal Problem:   Acute on chronic respiratory failure with hypoxia (HCC) Active Problems:   Essential hypertension   Hypothyroidism   Pulmonary hypertension (HCC)   COPD with acute exacerbation (HCC)   Recommendation   1. This is an 80 year old female with worsening pulmonary hypertension and acute on chronic respiratory failure with hypoxia.  Echo shows now likely severe pulmonary hypertension, but fortunately LV function is preserved.  Cardiology is asked to evaluate for right heart catheterization.  I agree this would be helpful in further  determine the etiology of her pulmonary hypertension as well as evaluating for possible response to therapies.  We will work to try to arrange a right heart catheterization hopefully Monday afternoon. Agree with high-res chest CT and further evaluation as to the etiology of her pulmonary hypertension.  Thanks for the consultation.  Cardiology will follow with you  Time Spent Directly with Patient:  I have spent a total of 45 minutes with the patient reviewing hospital notes, telemetry, EKGs,  labs and examining the patient as well as establishing an assessment and plan that was discussed personally with the patient.  > 50% of time was spent in direct patient care.  Length of Stay:  LOS: 3 days   Pixie Casino, MD, [facility], [place] Director of the Advanced Lipid Disorders &  Cardiovascular Risk Reduction Clinic Diplomate of the American Board of Clinical Lipidology Attending Cardiologist  Direct Dial: [phone]  Fax: [phone]  Website:  [email or web address] 08/30/2020, 3:31 PM

## 2020-08-30 NOTE — Evaluation (Signed)
Clinical/Bedside Swallow Evaluation Patient Details  Name: SHALYNN JORSTAD MRN: [id number] Date of Birth: 03/30/1941  Today's Date: 08/30/2020 Time: SLP Start Time (ACUTE ONLY): 36 SLP Stop Time (ACUTE ONLY): 1625 SLP Time Calculation (min) (ACUTE ONLY): 10 min  Past Medical History:  Past Medical History:  Diagnosis Date  . Allergy   . Arthritis   . Carotid artery occlusion   . COPD (chronic obstructive pulmonary disease) ([place])   . GERD (gastroesophageal reflux disease)   . Hypertension   . Joint pain   . Thyroid disease    Past Surgical History:  Past Surgical History:  Procedure Laterality Date  . ABDOMINAL HYSTERECTOMY    . AORTIC ARCH ANGIOGRAPHY N/A 03/01/2017   Procedure: AORTIC ARCH ANGIOGRAPHY;  Surgeon: Serafina [name], MD;  Location: [place] [facility];  Service: Cardiovascular;  Laterality: N/A;  . ARCH AORTOGRAM N/A 05/04/2011   Procedure: ARCH AORTOGRAM;  Surgeon: Serafina [name], MD;  Location: [facility] CATH LAB;  Service: Cardiovascular;  Laterality: N/A;  . ARCH AORTOGRAM N/A 07/04/2012   Procedure: ARCH AORTOGRAM;  Surgeon: Serafina [name], MD;  Location: [facility] CATH LAB;  Service: Cardiovascular;  Laterality: N/A;  . CAROTID ANGIOGRAM  July 04, 2012  . CAROTID ANGIOGRAM N/A 05/04/2011   Procedure: CAROTID ANGIOGRAM;  Surgeon: Serafina [name], MD;  Location: [facility] CATH LAB;  Service: Cardiovascular;  Laterality: N/A;  . CAROTID ANGIOGRAM N/A 07/04/2012   Procedure: CAROTID ANGIOGRAM;  Surgeon: Serafina [name], MD;  Location: [facility] CATH LAB;  Service: Cardiovascular;  Laterality: N/A;  . CAROTID STENT INSERTION Right 05/11/2011   Procedure: CAROTID STENT INSERTION;  Surgeon: Serafina [name], MD;  Location: [facility] CATH LAB;  Service: Cardiovascular;  Laterality: Right;  . carotid subclavian anastomosis  05/11/11   Right subclav. artery transected 12/17/09  . CATARACT EXTRACTION     bilateral  . EYE SURGERY     HPI:  Patient is an 80 y.o. female with PMH: COPD, HTN, hypothyroidism  who has been having increasing SOB with increasing need for oxygen and at time of admission was at 5L oxygen via nasal cannula. She was started on oral prednisone by her primary care pulmonary MD, however she continued with SOB with progressive tachynea and oxygen saturations on 75% on 4L. EMS was activated and she was put on 15L NRB and transported to [facility] for evaluation. CXR was clear, CTA chest without infiltrates but with mild leukocytosis with left shift stronly suggestive of underlying infection as exacerbating factor.  SLP swallow evaluation ordered secondary to MD questioning dysphagia and PPI was added to medications in case of GERD.   Assessment / Plan / Recommendation Clinical Impression  Patient presents with oropharyngeal swallow that appears to be [facility] and without overt s/s aspiration or penetration even with patient taking successive straw sips of thin liquids. Per RN and patient report, she has not had any difficulty with PO intake and patient also denies any h/o dysphagia. No observed change in oxygen saturations during PO intake with patient's SpO2 maintained in range of 90-92% on 5L oxygen via [place]. Patient briefly educated to take rest breaks when eating and to continue monitoring oxygen saturations at home upon hospital discharge. SLP Visit Diagnosis: Dysphagia, unspecified (R13.10)    Aspiration Risk  No limitations    Diet Recommendation Regular;Thin liquid   Liquid Administration via: Cup;Straw Medication Administration: Whole meds with liquid Supervision: Patient able to self feed Postural Changes: Seated upright at 90 degrees    Other  Recommendations Oral Care Recommendations: Oral care BID   Follow up Recommendations None      Frequency and Duration   N/A         Prognosis   N/A     Swallow Study   General Date of Onset: 08/27/20 HPI: Patient is an 80 y.o. female with PMH: COPD, HTN, hypothyroidism who has been having increasing SOB with increasing need for  oxygen and at time of admission was at 5L oxygen via nasal cannula. She was started on oral prednisone by her primary care pulmonary MD, however she continued with SOB with progressive tachynea and oxygen saturations on 75% on 4L. EMS was activated and she was put on 15L NRB and transported to [facility] for evaluation. CXR was clear, CTA chest without infiltrates but with mild leukocytosis with left shift stronly suggestive of underlying infection as exacerbating factor.  SLP swallow evaluation ordered secondary to MD questioning dysphagia and PPI was added to medications in case of GERD. Type of Study: Bedside Swallow Evaluation Previous Swallow Assessment: None found Diet Prior to this Study: Regular;Thin liquids Temperature Spikes Noted: No Respiratory Status: Nasal cannula History of Recent Intubation: No Behavior/Cognition: Alert;Cooperative;Pleasant mood Oral Cavity Assessment: Within Functional Limits Oral Care Completed by SLP: No Oral Cavity - Dentition: Dentures, top;Dentures, bottom Vision: Functional for self-feeding Self-Feeding Abilities: Able to feed self Patient Positioning: Upright in chair Baseline Vocal Quality: Normal Volitional Cough: Strong Volitional Swallow: Able to elicit    Oral/Motor/Sensory Function Overall Oral Motor/Sensory Function: Within functional limits   Ice Chips     Thin Liquid Thin Liquid: Within functional limits Presentation: Straw;Self Fed    Nectar Thick     Honey Thick     Puree Puree: Not tested   Solid     Solid: Not tested     Sonia Baller, MA, CCC-SLP Speech Therapy [facility] Acute Rehab

## 2020-08-31 DIAGNOSIS — J9621 Acute and chronic respiratory failure with hypoxia: Secondary | ICD-10-CM | POA: Diagnosis not present

## 2020-08-31 MED ORDER — SODIUM CHLORIDE 0.9% FLUSH: 3.0000 mL | INTRAVENOUS | Status: DC | PRN

## 2020-08-31 MED ORDER — SODIUM CHLORIDE 0.9 % IV SOLN: 250.0000 mL | INTRAVENOUS | Status: DC | PRN

## 2020-08-31 MED ORDER — SODIUM CHLORIDE 0.9 % IV SOLN: INTRAVENOUS | Status: DC

## 2020-08-31 MED ORDER — SODIUM CHLORIDE 0.9% FLUSH
3.0000 mL | Freq: Two times a day (BID) | INTRAVENOUS | Status: DC
  Administered 2020-08-31 – 2020-09-06 (×11): 3 mL via INTRAVENOUS

## 2020-08-31 NOTE — Progress Notes (Signed)
Progress Note    [name]  WUJ:[id number] DOB: 1941/01/16  DOA: 08/27/2020 PCP: [name], MD    Brief Narrative:     Medical records reviewed and are as summarized below:  [name] is an 80 y.o. female with medical history significant of COPD, HTN, Hypothyroidism. She is followed by [name] for pulmonary with last OV 08/15/20. She has been having increasing SOB with increasing need for Oxygen now up to 5 L [place]. Due to increasing symptoms she contacted her doctor who called in Augmentin and started her on oral prednisone. She continued to be short of breath with progressive tachypnea and sats of 75% on 4L. EMS activated. She was put to 15L NRB and transported to [facility] for evaluation.  On echo was found to have worsening pulmonary HTN,  Card/pulm  Assessment/Plan:   Principal Problem:   Acute on chronic respiratory failure with hypoxia (HCC) Active Problems:   Essential hypertension   Hypothyroidism   Pulmonary hypertension (HCC)   COPD with acute exacerbation (HCC)   Worsening pulmonary HTN with ? Fluid overload and ? COPD exacerbation  - no PFTs outpatient  -Follows with [name] for pulmonary.  -CXR clear, CTA chest w/o infiltrates but mild leukocytosis with left shift strongly suggestive of underlying infection as exacerbating factor.             procalcitonin negative so will d/c abx             Continue home respiratory treatments             Prednisone 20 mg bid- wean to off            wean O2 to 5L as able currently on 12L HF  -BNP elevated --s/p IV lasix x 1 with good output  -? Recent COVID infection 4 months ago?- patient denies  -last echo 9/21: moderately elevated pulmonary artery systolic pressure-- repeat  echo is severe pulm HTN with grade 1 dHF  Pulmonary consult placed as well as cards-- tentative plan for [facility]   HTN- hold home meds while we are diuresing  Hypothyroidism -  recent TSH in range. - continue synthroid  dose.  GERD: PPI added, seen by speech- no swallowing issues   Family Communication/Anticipated D/C date and plan/Code Status   DVT prophylaxis: Lovenox ordered. Code Status: Full Code.  Disposition Plan: Status is: Inpatient Family at bedside Remains inpatient appropriate because:Inpatient level of care appropriate due to severity of illness   Dispo: The patient is from: Home              Anticipated d/c is to: Home              Patient currently is not medically stable to d/c.   Difficult to place patient No         Medical Consultants:    PCCM  cards    Subjective:   Says she feels fine  Objective:    Vitals:   08/31/20 0030 08/31/20 0427 08/31/20 0821 08/31/20 0823  BP: 135/72 135/76 130/76   Pulse: 70 75 69   Resp: _0 Temp: 97.7 F (36.5 C) 97.6 F (36.4 C) 98 F (36.7 C)   TempSrc: Oral Oral Oral   SpO2: 95% 95% 94% 98%  Weight:      Height:        Intake/Output Summary (Last 24 hours) at 08/31/2020 0844 Last data filed at 08/30/2020 2011  Gross per 24 hour  Intake 360 ml  Output 700 ml  Net -340 ml   Filed Weights   08/27/20 1433 08/28/20 1317  Weight: 58.8 kg 58.8 kg    Exam:  General: Appearance:    elderly female in no acute distress     Lungs:     respirations unlabored, on HF [place]  Heart:    Normal heart rate. Normal rhythm. No murmurs, rubs, or gallops.   MS:   All extremities are intact.   Neurologic:   Awake, alert, oriented x 3. No apparent focal neurological           defect.      Data Reviewed:   I have personally reviewed following labs and imaging studies:  Labs: Labs show the following:   Basic Metabolic Panel: Recent Labs  Lab 08/27/20 1451 08/29/20 0248 08/30/20 0936  NA 139 138 138  K 4.1 4.5 3.8  CL 113* 109 109  CO2 15* 21* 20*  GLUCOSE 130* 132* 118*  BUN 9 24* 31*  CREATININE 0.79 1.02* 1.02*  CALCIUM 9.0 9.5 9.3   GFR Estimated Creatinine Clearance: 34.2 mL/min (A) (by C-G formula  based on SCr of 1.02 mg/dL (H)). Liver Function Tests: No results for input(s): AST, ALT, ALKPHOS, BILITOT, PROT, ALBUMIN in the last 168 hours. No results for input(s): LIPASE, AMYLASE in the last 168 hours. No results for input(s): AMMONIA in the last 168 hours. Coagulation profile No results for input(s): INR, PROTIME in the last 168 hours.  CBC: Recent Labs  Lab 08/27/20 1451 08/29/20 0248  WBC 12.3* 15.3*  NEUTROABS 10.2*  --   HGB 12.4 11.7*  HCT 39.0 36.8  MCV 99.2 96.3  PLT 247 257   Cardiac Enzymes: No results for input(s): CKTOTAL, CKMB, CKMBINDEX, TROPONINI in the last 168 hours. BNP (last 3 results) No results for input(s): PROBNP in the last 8760 hours. CBG: No results for input(s): GLUCAP in the last 168 hours. D-Dimer: No results for input(s): DDIMER in the last 72 hours. Hgb A1c: No results for input(s): HGBA1C in the last 72 hours. Lipid Profile: No results for input(s): CHOL, HDL, LDLCALC, TRIG, CHOLHDL, LDLDIRECT in the last 72 hours. Thyroid function studies: No results for input(s): TSH, T4TOTAL, T3FREE, THYROIDAB in the last 72 hours.  Invalid input(s): FREET3 Anemia work up: No results for input(s): VITAMINB12, FOLATE, FERRITIN, TIBC, IRON, RETICCTPCT in the last 72 hours. Sepsis Labs: Recent Labs  Lab 08/27/20 1451 08/28/20 1406 08/29/20 0248  PROCALCITON  --  <0.10  --   WBC 12.3*  --  15.3*    Microbiology Recent Results (from the past 240 hour(s))  Resp Panel by RT-PCR (Flu A&B, Covid) Nasopharyngeal Swab     Status: None   Collection Time: 08/27/20  3:03 PM   Specimen: Nasopharyngeal Swab; Nasopharyngeal(NP) swabs in vial transport medium  Result Value Ref Range Status   SARS Coronavirus 2 by RT PCR NEGATIVE NEGATIVE Final    Comment: (NOTE) SARS-CoV-2 target nucleic acids are NOT DETECTED.  The SARS-CoV-2 RNA is generally detectable in upper respiratory specimens during the acute phase of infection. The lowest concentration of  SARS-CoV-2 viral copies this assay can detect is 138 copies/mL. A negative result does not preclude SARS-Cov-2 infection and should not be used as the sole basis for treatment or other patient management decisions. A negative result may occur with  improper specimen collection/handling, submission of specimen other than nasopharyngeal swab, presence of viral mutation(s) within the  areas targeted by this assay, and inadequate number of viral copies(<138 copies/mL). A negative result must be combined with clinical observations, patient history, and epidemiological information. The expected result is Negative.  Fact Sheet for Patients:  EntrepreneurPulse.com.au  Fact Sheet for Healthcare Providers:  IncredibleEmployment.be  This test is no t yet approved or cleared by the [place] FDA and  has been authorized for detection and/or diagnosis of SARS-CoV-2 by FDA under an Emergency Use Authorization (EUA). This EUA will remain  in effect (meaning this test can be used) for the duration of the COVID-19 declaration under Section 564(b)(1) of the Act, 21 U.S.C.section 360bbb-3(b)(1), unless the authorization is terminated  or revoked sooner.       Influenza A by PCR NEGATIVE NEGATIVE Final   Influenza B by PCR NEGATIVE NEGATIVE Final    Comment: (NOTE) The Xpert Xpress SARS-CoV-2/FLU/RSV plus assay is intended as an aid in the diagnosis of influenza from Nasopharyngeal swab specimens and should not be used as a sole basis for treatment. Nasal washings and aspirates are unacceptable for Xpert Xpress SARS-CoV-2/FLU/RSV testing.  Fact Sheet for Patients: EntrepreneurPulse.com.au  Fact Sheet for Healthcare Providers: IncredibleEmployment.be  This test is not yet approved or cleared by the [place] FDA and has been authorized for detection and/or diagnosis of SARS-CoV-2 by FDA under an Emergency Use  Authorization (EUA). This EUA will remain in effect (meaning this test can be used) for the duration of the COVID-19 declaration under Section 564(b)(1) of the Act, 21 U.S.C. section 360bbb-3(b)(1), unless the authorization is terminated or revoked.  Performed at [facility], [place] [address], [place], [place] [address]     Procedures and diagnostic studies:  CT Chest High Resolution  Result Date: 08/30/2020 CLINICAL DATA:  Inpatient. Suspected sarcoidosis. Dyspnea and hypoxia on recent chest CT report. EXAM: CT CHEST WITHOUT CONTRAST TECHNIQUE: Multidetector CT imaging of the chest was performed following the standard protocol without intravenous contrast. High resolution imaging of the lungs, as well as inspiratory and expiratory imaging, was performed. COMPARISON:  08/27/2020 chest CT angiogram. FINDINGS: Cardiovascular: Normal heart size. No significant pericardial effusion/thickening. Three-vessel coronary atherosclerosis. Right brachiocephalic artery stent. Atherosclerotic nonaneurysmal thoracic aorta. Top-normal caliber main pulmonary artery (3.2 cm diameter). Mediastinum/Nodes: No discrete thyroid nodules. Unremarkable esophagus. No axillary adenopathy. Mildly enlarged 1.0 cm short axis diameter right paratracheal node (series 5/image 54), slightly decreased from 1.2 cm on 08/27/2020 chest CT. Mildly enlarged 1.0 cm left paratracheal node (series 5/image 55), mildly decreased from 1.2 cm. Previously mildly enlarged subcarinal node is decreased and no longer enlarged. No new pathologically enlarged mediastinal nodes. No discrete hilar adenopathy on these noncontrast images. Lungs/Pleura: No pneumothorax. No pleural effusion. Subpleural anterior right upper lobe solid 0.5 cm pulmonary nodule (series 6/image 30), unchanged since 05/01/2020 chest CT. Separate anterior right upper lobe 0.4 cm solid pulmonary nodule (series 6/image 42), decreased from 0.6 cm on 05/01/2020 chest CT. No acute  consolidative airspace disease, lung masses or new significant pulmonary nodules. Small parenchymal bands in the dependent lower lobes bilaterally are unchanged. Mild patchy subpleural reticulation in both lungs is unchanged. No significant regions of traction bronchiectasis, architectural distortion or frank honeycombing. No significant lobular air trapping or evidence of tracheobronchomalacia on the expiration sequence. Mild centrilobular emphysema with diffuse bronchial wall thickening. These findings are not appreciably changed since 05/01/2020 chest CT. Upper abdomen: No acute abnormality. Coarsely calcified nonenlarged porta hepatis node is unchanged. Musculoskeletal: No aggressive appearing focal osseous lesions. Marked thoracic spondylosis. IMPRESSION:  1. Mild noncalcified mediastinal lymphadenopathy is mildly decreased since 08/27/2020 chest CT, nonspecific, likely benign reactive adenopathy. 2. No compelling findings of interstitial lung disease. Mild patchy subpleural reticulation in both lungs and parenchymal bands in the dependent lower lobes is unchanged and nonspecific, favoring mild nonspecific postinfectious/postinflammatory scarring. 3. Mild centrilobular emphysema with diffuse bronchial wall thickening, suggesting COPD. 4. Small right upper lobe solid pulmonary nodules are stable to decreased since 05/01/2020 chest CT, more likely benign. Follow-up noncontrast chest CT recommended in 12-18 months. 5. Aortic Atherosclerosis (ICD10-I70.0) and Emphysema (ICD10-J43.9). Electronically Signed   By: Ilona Sorrel M.D.   On: 08/30/2020 20:06   ECHOCARDIOGRAM COMPLETE  Result Date: 08/29/2020    ECHOCARDIOGRAM REPORT   Patient Name:   [name] Date of Exam: 08/29/2020 Medical Rec #:  [id number]       Height:       62.0 in Accession #:    [id number]      Weight:       129.6 lb Date of Birth:  06/14/1940       BSA:          1.590 m Patient Age:    42 years        BP:           127/66 mmHg Patient  Gender: F               HR:           84 bpm. Exam Location:  Inpatient Procedure: 2D Echo, Cardiac Doppler, Color Doppler and Intracardiac            Opacification Agent Indications:    Dyspnea  History:        Patient has prior history of Echocardiogram examinations, most                 recent 12/26/2019. COPD, Signs/Symptoms:Shortness of Breath; Risk                 Factors:Hypertension.  Sonographer:    Clayton Lefort RDCS (AE) Referring Phys: [place]  1. Left ventricular ejection fraction, by estimation, is 60 to 65%. The left ventricle has normal function. The left ventricle has no regional wall motion abnormalities. There is mild concentric left ventricular hypertrophy. Left ventricular diastolic parameters are consistent with Grade I diastolic dysfunction (impaired relaxation).  2. Right ventricular systolic function is mildly reduced. The right ventricular size is moderately enlarged. There is severely elevated pulmonary artery systolic pressure. The estimated right ventricular systolic pressure is 92.4 mmHg.  3. The mitral valve is normal in structure. No evidence of mitral valve regurgitation. No evidence of mitral stenosis.  4. The aortic valve is tricuspid. There is mild calcification of the aortic valve. There is mild thickening of the aortic valve. Aortic valve regurgitation is not visualized. Mild aortic valve sclerosis is present, with no evidence of aortic valve stenosis. Comparison(s): Compared to prior TTE in 12/2019, there is severe pulmonary HTN with PASP 42mHg. FINDINGS  Left Ventricle: Left ventricular ejection fraction, by estimation, is 60 to 65%. The left ventricle has normal function. The left ventricle has no regional wall motion abnormalities. Definity contrast agent was given IV to delineate the left ventricular  endocardial borders. The left ventricular internal cavity size was normal in size. There is mild concentric left ventricular hypertrophy. Left ventricular  diastolic parameters are consistent with Grade I diastolic dysfunction (impaired relaxation). Right Ventricle: The right ventricular size is moderately enlarged. Right  vetricular wall thickness was not well visualized. Right ventricular systolic function is mildly reduced. There is severely elevated pulmonary artery systolic pressure. The tricuspid regurgitant velocity is 4.04 m/s, and with an assumed right atrial pressure of 8 mmHg, the estimated right ventricular systolic pressure is 71.9 mmHg. Left Atrium: Left atrial size was normal in size. Right Atrium: Right atrial size was normal in size. Pericardium: There is no evidence of pericardial effusion. Mitral Valve: The mitral valve is normal in structure. There is mild thickening of the mitral valve leaflet(s). There is mild calcification of the mitral valve leaflet(s). No evidence of mitral valve regurgitation. No evidence of mitral valve stenosis. MV peak gradient, 2.6 mmHg. The mean mitral valve gradient is 1.0 mmHg. Tricuspid Valve: The tricuspid valve is normal in structure. Tricuspid valve regurgitation is trivial. Aortic Valve: The aortic valve is tricuspid. There is mild calcification of the aortic valve. There is mild thickening of the aortic valve. Aortic valve regurgitation is not visualized. Mild aortic valve sclerosis is present, with no evidence of aortic valve stenosis. Aortic valve mean gradient measures 2.0 mmHg. Aortic valve peak gradient measures 4.2 mmHg. Aortic valve area, by VTI measures 2.19 cm. Pulmonic Valve: The pulmonic valve was normal in structure. Pulmonic valve regurgitation is trivial. Aorta: The aortic root and ascending aorta are structurally normal, with no evidence of dilitation. IAS/Shunts: No atrial level shunt detected by color flow Doppler.  LEFT VENTRICLE PLAX 2D LVIDd:         4.10 cm  Diastology LVIDs:         2.90 cm  LV e' medial:    7.29 cm/s LV PW:         1.30 cm  LV E/e' medial:  8.2 LV IVS:        1.40 cm  LV e'  lateral:   8.92 cm/s LVOT diam:     1.90 cm  LV E/e' lateral: 6.7 LV SV:         42 LV SV Index:   26 LVOT Area:     2.84 cm  RIGHT VENTRICLE            IVC RV Basal diam:  3.00 cm    IVC diam: 2.20 cm RV S prime:     8.70 cm/s TAPSE (M-mode): 1.3 cm LEFT ATRIUM             Index       RIGHT ATRIUM           Index LA diam:        2.00 cm 1.26 cm/m  RA Area:     13.60 cm LA Vol (A2C):   30.1 ml 18.93 ml/m RA Volume:   33.70 ml  21.20 ml/m LA Vol (A4C):   21.4 ml 13.46 ml/m LA Biplane Vol: 25.5 ml 16.04 ml/m  AORTIC VALVE AV Area (Vmax):    2.03 cm AV Area (Vmean):   2.19 cm AV Area (VTI):     2.19 cm AV Vmax:           103.00 cm/s AV Vmean:          74.100 cm/s AV VTI:            0.192 m AV Peak Grad:      4.2 mmHg AV Mean Grad:      2.0 mmHg LVOT Vmax:         73.80 cm/s LVOT Vmean:        57.300 cm/s  LVOT VTI:          0.148 m LVOT/AV VTI ratio: 0.77  AORTA Ao Root diam: 3.40 cm Ao Asc diam:  3.10 cm MITRAL VALVE               TRICUSPID VALVE MV Area (PHT): 2.76 cm    TR Peak grad:   65.3 mmHg MV Area VTI:   2.27 cm    TR Vmax:        404.00 cm/s MV Peak grad:  2.6 mmHg MV Mean grad:  1.0 mmHg    SHUNTS MV Vmax:       0.81 m/s    Systemic VTI:  0.15 m MV Vmean:      48.1 cm/s   Systemic Diam: 1.90 cm MV Decel Time: 275 msec MV E velocity: 59.55 cm/s MV A velocity: 68.60 cm/s MV E/A ratio:  0.87 Gwyndolyn Kaufman MD Electronically signed by Gwyndolyn Kaufman MD Signature Date/Time: 08/29/2020/5:44:55 PM    Final     Medications:   . amLODipine  5 mg Oral Daily  . arformoterol  15 mcg Nebulization BID  . aspirin  81 mg Oral Daily  . budesonide  0.5 mg Nebulization BH-qamhs  . clopidogrel  75 mg Oral Daily  . diclofenac Sodium  2 g Topical QID  . enoxaparin (LOVENOX) injection  40 mg Subcutaneous Q24H  . gabapentin  100 mg Oral QHS  . levothyroxine  75 mcg Oral Q0600  . loratadine  10 mg Oral Daily  . mouth rinse  15 mL Mouth Rinse BID  . metoprolol succinate  25 mg Oral Daily  .  pantoprazole  40 mg Oral Daily  . predniSONE  20 mg Oral Q breakfast  . simvastatin  20 mg Oral q1800  . sodium chloride flush  3 mL Intravenous Q12H   Continuous Infusions: . sodium chloride 250 mL (08/30/20 0051)     LOS: 4 days   Geradine Girt  [facility]   How to contact the [facility] Attending or Consulting provider [place] or covering provider during after hours [place], for this patient?  1. Check the care team in [facility] and look for a) attending/consulting [facility] provider listed and b) the [facility] team listed 2. Log into [email or web address] and use 's universal password to access. If you do not have the password, please contact the hospital operator. 3. Locate the [facility] provider you are looking for under [facility] and page to a number that you can be directly reached. 4. If you still have difficulty reaching the provider, please page the [facility] (Director on Call) for the Hospitalists listed on amion for assistance.  08/31/2020, 8:44 AM

## 2020-08-31 NOTE — Consult Note (Signed)
NAME:  [name], MRN:  [id number], DOB:  07-08-1940, LOS: 4 ADMISSION DATE:  08/27/2020, CONSULTATION DATE:  08/30/2020 REFERRING MD: Eulogio Bear, CHIEF COMPLAINT: Respiratory failure, pulmonary hypertension  History of Present Illness:  80 year old with COPD, hypertension, pulmonary hypertension, hypothyroidism, chronic respiratory failure on home oxygen  Admitted with worsening dyspnea.  She was prescribed Augmentin and prednisone as an outpatient but got admitted with progressive symptoms.  PCCM consulted for escalating oxygen requirements and echo showing worsening pulmonary hypertension  Pertinent  Medical History    has a past medical history of Allergy, Arthritis, Carotid artery occlusion, COPD (chronic obstructive pulmonary disease) ([place]), GERD (gastroesophageal reflux disease), Hypertension, Joint pain, and Thyroid disease.  Significant Hospital Events: Including procedures, antibiotic start and stop dates in addition to other pertinent events   . 5/18-admit  Interim History / Subjective:   No new complaints.  Continues to have stable dyspnea on exertion On 12 L high flow  Objective   Blood pressure 114/66, pulse 81, temperature 98.5 F (36.9 C), temperature source Axillary, resp. rate (!) 22, height _0  (1.575 m), weight 58.8 kg, SpO2 93 %.        Intake/Output Summary (Last 24 hours) at 08/31/2020 1330 Last data filed at 08/30/2020 2011 Gross per 24 hour  Intake 360 ml  Output 700 ml  Net -340 ml   Filed Weights   08/27/20 1433 08/28/20 1317  Weight: 58.8 kg 58.8 kg    Examination: Blood pressure 114/66, pulse 81, temperature 98.5 F (36.9 C), temperature source Axillary, resp. rate (!) 22, height _1  (1.575 m), weight 58.8 kg, SpO2 93 %. Gen:      No acute distress HEENT:  EOMI, sclera anicteric Neck:     No masses; no thyromegaly Lungs:    Clear to auscultation bilaterally; normal respiratory effort CV:         Regular rate and rhythm; no  murmurs Abd:      + bowel sounds; soft, non-tender; no palpable masses, no distension Ext:    No edema; adequate peripheral perfusion Skin:      Warm and dry; no rash Neuro: alert and oriented x 3 Psych: normal mood and affect  Labs/imaging that I havepersonally reviewed  (right click and "Reselect all SmartList Selections" daily)   CTA 5/18-no pulmonary embolism mild emphysema, distal atelectasis and scattered mediastinal and hilar lymphadenopathy that is stable.  Echocardiogram 08/29/2020- LVEF 60 to 65%, mild concentric LVH, grade 1 diastolic dysfunction.  60-year-old pulmonary hypertension, estimated RVSP 73.3  High-res CT 08/30/2020- mild reticulation and atelectatic changes at the base. no significant interstitial lung disease  Outpatient work-up Sleep study 03/19/2020- AHI 5.5 with desats  VQ scan 12/27/2019-low probability for PE  Lower extremity duplex 12/27/19 negative for DVT  Autoimmune serologies 12/27/2019-negative  Unable to complete PFTs  Resolved Hospital Problem list     Assessment & Plan:  Severe pulmonary hypertension  Pulmonary hypertension etiology is unclear She does have mild emphysema and smoking history, diastolic dysfunction and mild sleep apnea but not enough to explain the degree of pulmonary hypertension  High-resolution CT does not show ILD, autoimmune serologies are negative and no thromboembolic disease.  No clear evidence of COPD exacerbation as there is no wheeze.  Okay to continue prednisone and bronchodilators for now pending further evaluation  She is scheduled for right heart catheterization tomorrow.  We will follow after study for further recommendations  Best practice (right click and "Reselect all SmartList Selections" daily)  Per  primary team  Signature:   Marshell Garfinkel MD [facility] See Amion for pager  If no response to pager , please call [phone] until 7pm After 7:00 pm call Elink   [phone] 08/31/2020, 1:30 PM

## 2020-09-01 ENCOUNTER — Encounter (HOSPITAL_COMMUNITY): Payer: Self-pay | Admitting: Cardiology

## 2020-09-01 ENCOUNTER — Encounter (HOSPITAL_COMMUNITY)
Admission: EM | Disposition: A | Discharge status: Home Health | Payer: Self-pay | Source: Home / Self Care | Attending: Internal Medicine

## 2020-09-01 ENCOUNTER — Other Ambulatory Visit (HOSPITAL_COMMUNITY): Payer: Self-pay

## 2020-09-01 DIAGNOSIS — J9621 Acute and chronic respiratory failure with hypoxia: Secondary | ICD-10-CM | POA: Diagnosis not present

## 2020-09-01 DIAGNOSIS — I272 Pulmonary hypertension, unspecified: Secondary | ICD-10-CM

## 2020-09-01 HISTORY — PX: RIGHT HEART CATH: CATH118263

## 2020-09-01 LAB — POCT I-STAT EG7
Acid-base deficit: 2 mmol/L (ref 0.0–2.0)
Acid-base deficit: 2 mmol/L (ref 0.0–2.0)
Bicarbonate: 22.7 mmol/L (ref 20.0–28.0)
Bicarbonate: 22.9 mmol/L (ref 20.0–28.0)
Calcium, Ion: 1.25 mmol/L (ref 1.15–1.40)
Calcium, Ion: 1.28 mmol/L (ref 1.15–1.40)
HCT: 42 % (ref 36.0–46.0)
HCT: 42 % (ref 36.0–46.0)
Hemoglobin: 14.3 g/dL (ref 12.0–15.0)
Hemoglobin: 14.3 g/dL (ref 12.0–15.0)
O2 Saturation: 73 %
O2 Saturation: 75 %
Potassium: 3.7 mmol/L (ref 3.5–5.1)
Potassium: 3.7 mmol/L (ref 3.5–5.1)
Sodium: 142 mmol/L (ref 135–145)
Sodium: 143 mmol/L (ref 135–145)
TCO2: 24 mmol/L (ref 22–32)
TCO2: 24 mmol/L (ref 22–32)
pCO2, Ven: 36.7 mmHg — ABNORMAL LOW (ref 44.0–60.0)
pCO2, Ven: 37.1 mmHg — ABNORMAL LOW (ref 44.0–60.0)
pH, Ven: 7.4 (ref 7.250–7.430)
pH, Ven: 7.399 (ref 7.250–7.430)
pO2, Ven: 38 mmHg (ref 32.0–45.0)
pO2, Ven: 40 mmHg (ref 32.0–45.0)

## 2020-09-01 LAB — HEPATIC FUNCTION PANEL
ALT: 24 U/L (ref 0–44)
AST: 26 U/L (ref 15–41)
Albumin: 3.3 g/dL — ABNORMAL LOW (ref 3.5–5.0)
Alkaline Phosphatase: 61 U/L (ref 38–126)
Bilirubin, Direct: 0.3 mg/dL — ABNORMAL HIGH (ref 0.0–0.2)
Indirect Bilirubin: 0.6 mg/dL (ref 0.3–0.9)
Total Bilirubin: 0.9 mg/dL (ref 0.3–1.2)
Total Protein: 7.1 g/dL (ref 6.5–8.1)

## 2020-09-01 LAB — BASIC METABOLIC PANEL
Anion gap: 7 (ref 5–15)
BUN: 28 mg/dL — ABNORMAL HIGH (ref 8–23)
CO2: 20 mmol/L — ABNORMAL LOW (ref 22–32)
Calcium: 9.2 mg/dL (ref 8.9–10.3)
Chloride: 109 mmol/L (ref 98–111)
Creatinine, Ser: 0.8 mg/dL (ref 0.44–1.00)
GFR, Estimated: >60 mL/min (ref >60)
Glucose, Bld: 93 mg/dL (ref 70–99)
Potassium: 4.4 mmol/L (ref 3.5–5.1)
Sodium: 136 mmol/L (ref 135–145)

## 2020-09-01 LAB — CBC
HCT: 41.4 % (ref 36.0–46.0)
Hemoglobin: 13.6 g/dL (ref 12.0–15.0)
MCH: 30.8 pg (ref 26.0–34.0)
MCHC: 32.9 g/dL (ref 30.0–36.0)
MCV: 93.7 fL (ref 80.0–100.0)
Platelets: 269 10*3/uL (ref 150–400)
RBC: 4.42 MIL/uL (ref 3.87–5.11)
RDW: 13.5 % (ref 11.5–15.5)
WBC: 14.2 10*3/uL — ABNORMAL HIGH (ref 4.0–10.5)
nRBC: 0 % (ref 0.0–0.2)

## 2020-09-01 LAB — MRSA PCR SCREENING: MRSA by PCR: POSITIVE — AB

## 2020-09-01 SURGERY — RIGHT HEART CATH
Anesthesia: LOCAL

## 2020-09-01 MED ORDER — MACITENTAN 10 MG PO TABS: 10.0000 mg | ORAL_TABLET | Freq: Every day | ORAL | Status: DC

## 2020-09-01 MED ORDER — SODIUM CHLORIDE 0.9% FLUSH
3.0000 mL | Freq: Two times a day (BID) | INTRAVENOUS | Status: DC
  Administered 2020-09-01: 3 mL via INTRAVENOUS

## 2020-09-01 MED ORDER — LIDOCAINE HCL (PF) 1 % IJ SOLN
INTRAMUSCULAR | Status: AC
  Filled 2020-09-01: qty 30

## 2020-09-01 MED ORDER — MACITENTAN 10 MG PO TABS
10.0000 mg | ORAL_TABLET | Freq: Every day | ORAL | Status: DC
  Filled 2020-09-01: qty 1

## 2020-09-01 MED ORDER — HYDRALAZINE HCL 20 MG/ML IJ SOLN: 10.0000 mg | INTRAMUSCULAR | Status: AC | PRN

## 2020-09-01 MED ORDER — LIDOCAINE HCL (PF) 1 % IJ SOLN
INTRAMUSCULAR | Status: DC | PRN
  Administered 2020-09-01: 2 mL

## 2020-09-01 MED ORDER — MACITENTAN 10 MG PO TABS
10.0000 mg | ORAL_TABLET | Freq: Every day | ORAL | Status: DC
Start: 2020-09-01 — End: 2020-09-06
  Administered 2020-09-01 – 2020-09-06 (×6): 10 mg via ORAL
  Filled 2020-09-01 (×6): qty 1

## 2020-09-01 MED ORDER — ACETAMINOPHEN 325 MG PO TABS: 650.0000 mg | ORAL_TABLET | ORAL | Status: DC | PRN

## 2020-09-01 MED ORDER — ENOXAPARIN SODIUM 40 MG/0.4ML IJ SOSY
40.0000 mg | PREFILLED_SYRINGE | INTRAMUSCULAR | Status: DC
  Administered 2020-09-02 – 2020-09-06 (×5): 40 mg via SUBCUTANEOUS
  Filled 2020-09-01 (×5): qty 0.4

## 2020-09-01 MED ORDER — SODIUM CHLORIDE 0.9 % IV SOLN: 250.0000 mL | INTRAVENOUS | Status: DC | PRN

## 2020-09-01 MED ORDER — ONDANSETRON HCL 4 MG/2ML IJ SOLN
4.0000 mg | Freq: Four times a day (QID) | INTRAMUSCULAR | Status: DC | PRN
  Administered 2020-09-04 – 2020-09-06 (×4): 4 mg via INTRAVENOUS
  Filled 2020-09-01 (×4): qty 2

## 2020-09-01 MED ORDER — MUPIROCIN 2 % EX OINT
1.0000 "application " | TOPICAL_OINTMENT | Freq: Two times a day (BID) | CUTANEOUS | Status: AC
  Administered 2020-09-01 – 2020-09-05 (×10): 1 via NASAL
  Filled 2020-09-01 (×3): qty 22

## 2020-09-01 MED ORDER — HEPARIN (PORCINE) IN NACL 1000-0.9 UT/500ML-% IV SOLN
INTRAVENOUS | Status: AC
  Filled 2020-09-01: qty 500

## 2020-09-01 MED ORDER — HEPARIN (PORCINE) IN NACL 1000-0.9 UT/500ML-% IV SOLN
INTRAVENOUS | Status: DC | PRN
  Administered 2020-09-01: 500 mL

## 2020-09-01 MED ORDER — SODIUM CHLORIDE 0.9% FLUSH: 3.0000 mL | INTRAVENOUS | Status: DC | PRN

## 2020-09-01 MED ORDER — LABETALOL HCL 5 MG/ML IV SOLN: 10.0000 mg | INTRAVENOUS | Status: AC | PRN

## 2020-09-01 SURGICAL SUPPLY — 8 items
CATH SWAN GANZ 7F STRAIGHT (CATHETERS) ×2 IMPLANT
GLIDESHEATH SLENDER 7FR .021G (SHEATH) ×2 IMPLANT
KIT HEART LEFT (KITS) ×2 IMPLANT
PACK CARDIAC CATHETERIZATION (CUSTOM PROCEDURE TRAY) ×2 IMPLANT
SHEATH PROBE COVER 6X72 (BAG) ×2 IMPLANT
TRANSDUCER W/STOPCOCK (MISCELLANEOUS) ×2 IMPLANT
TUBING ART PRESS 72  MALE/FEM (TUBING) ×2
TUBING ART PRESS 72 MALE/FEM (TUBING) ×1 IMPLANT

## 2020-09-01 NOTE — Progress Notes (Addendum)
Patient ID: [name], female   DOB: [birth year], 80 y.o.   MRN: [id number]     Advanced Heart Failure Rounding Note  PCP-Cardiologist: None   Subjective:    Breathing better.  Remains on 12L oxygen.  She is on prednisone 20 mg daily.   RHC today: Hemodynamics (mmHg) RA mean 1 RV 67/1 PA 72/13, mean 36 PCWP mean 1 Oxygen saturations: PA 74% AO 99% Cardiac Output (Fick) 4.61  Cardiac Index (Fick) 2.88 PVR 7.6 WU  Echo: EF 60-65%, mild LVH, moderate RV enlargement, mildly decreased RV systolic function, PASP 73 mmHg.   High resolution CT chest: No evidence for ILD, mild emphysema, mediastinal LAN less prominent and likely reactive.   Objective:   Weight Range: 59.6 kg Body mass index is 24.03 kg/m.   Vital Signs:   Temp:  [97.7 F (36.5 C)-98.5 F (36.9 C)] 98.1 F (36.7 C) (05/23 0505) Pulse Rate:  [68-84] 68 (05/23 0505) Resp:  [21-25] 21 (05/23 0505) BP: (114-134)/(62-83) 131/63 (05/23 0505) SpO2:  [93 %-98 %] 96 % (05/23 0810) Weight:  [59.6 kg] 59.6 kg (05/23 0505) Last BM Date: 08/31/20  Weight change: Filed Weights   08/27/20 1433 08/28/20 1317 09/01/20 0505  Weight: 58.8 kg 58.8 kg 59.6 kg    Intake/Output:   Intake/Output Summary (Last 24 hours) at 09/01/2020 0855 Last data filed at 09/01/2020 0529 Gross per 24 hour  Intake 240 ml  Output 1000 ml  Net -760 ml      Physical Exam    General:  Well appearing. No resp difficulty HEENT: Normal Neck: Supple. JVP not elevated. Carotids 2+ bilat; no bruits. No lymphadenopathy or thyromegaly appreciated. Cor: PMI nondisplaced. Regular rate & rhythm. No rubs, gallops or murmurs. Lungs: Clear Abdomen: Soft, nontender, nondistended. No hepatosplenomegaly. No bruits or masses. Good bowel sounds. Extremities: No cyanosis, clubbing, rash, edema Neuro: Alert & orientedx3, cranial nerves grossly intact. moves all 4 extremities w/o difficulty. Affect pleasant   Telemetry   NSR 80s (personally  reviewed)   Labs    CBC Recent Labs    09/01/20 0139  WBC 14.2*  HGB 13.6  HCT 41.4  MCV 93.7  PLT 093   Basic Metabolic Panel Recent Labs    08/30/20 0936 09/01/20 0139  NA 138 136  K 3.8 4.4  CL 109 109  CO2 20* 20*  GLUCOSE 118* 93  BUN 31* 28*  CREATININE 1.02* 0.80  CALCIUM 9.3 9.2   Liver Function Tests No results for input(s): AST, ALT, ALKPHOS, BILITOT, PROT, ALBUMIN in the last 72 hours. No results for input(s): LIPASE, AMYLASE in the last 72 hours. Cardiac Enzymes No results for input(s): CKTOTAL, CKMB, CKMBINDEX, TROPONINI in the last 72 hours.  BNP: BNP (last 3 results) Recent Labs    05/01/20 1232 08/28/20 1406 08/30/20 0936  BNP 80.9 218.6* 107.9*    ProBNP (last 3 results) No results for input(s): PROBNP in the last 8760 hours.   D-Dimer No results for input(s): DDIMER in the last 72 hours. Hemoglobin A1C No results for input(s): HGBA1C in the last 72 hours. Fasting Lipid Panel No results for input(s): CHOL, HDL, LDLCALC, TRIG, CHOLHDL, LDLDIRECT in the last 72 hours. Thyroid Function Tests No results for input(s): TSH, T4TOTAL, T3FREE, THYROIDAB in the last 72 hours.  Invalid input(s): FREET3  Other results:   Imaging    CARDIAC CATHETERIZATION  Result Date: 09/01/2020 1. Low filling pressures. 2. Severe pulmonary arterial hypertension.      Medications:  Scheduled Medications: . [MAR Hold] amLODipine  5 mg Oral Daily  . [MAR Hold] arformoterol  15 mcg Nebulization BID  . [MAR Hold] aspirin  81 mg Oral Daily  . [MAR Hold] budesonide  0.5 mg Nebulization BH-qamhs  . [MAR Hold] clopidogrel  75 mg Oral Daily  . [MAR Hold] diclofenac Sodium  2 g Topical QID  . [MAR Hold] enoxaparin (LOVENOX) injection  40 mg Subcutaneous Q24H  . [MAR Hold] gabapentin  100 mg Oral QHS  . [MAR Hold] levothyroxine  75 mcg Oral Q0600  . [MAR Hold] loratadine  10 mg Oral Daily  . [MAR Hold] mouth rinse  15 mL Mouth Rinse BID  . [MAR  Hold] metoprolol succinate  25 mg Oral Daily  . mupirocin ointment  1 application Nasal BID  . [MAR Hold] pantoprazole  40 mg Oral Daily  . [MAR Hold] predniSONE  20 mg Oral Q breakfast  . [MAR Hold] simvastatin  20 mg Oral q1800  . [MAR Hold] sodium chloride flush  3 mL Intravenous Q12H  . [MAR Hold] sodium chloride flush  3 mL Intravenous Q12H     Infusions: . [MAR Hold] sodium chloride 250 mL (08/30/20 0051)  . sodium chloride    . sodium chloride 10 mL/hr at 09/01/20 0528     PRN Medications:  [MAR Hold] sodium chloride, sodium chloride, [MAR Hold] acetaminophen, [MAR Hold] albuterol, [MAR Hold] diazepam, [MAR Hold] docusate sodium, [MAR Hold] fluticasone, [MAR Hold] guaiFENesin-dextromethorphan, [MAR Hold] sodium chloride flush, sodium chloride flush   Assessment/Plan   1. Pulmonary hypertension:  Echo (5/22) with EF 60-65%, mild LVH, moderate RV enlargement, mildly decreased RV systolic function, PASP 73 mmHg. High resolution CT chest with no evidence for ILD, mild emphysema, mediastinal LAN less prominent than on prior CT and likely reactive. She had a V/Q scan in 9/21 that was not suggestive of chronic PEs and lower extremity venous dopplers with no DVT.  Unable to obtain PFTs.  She has mild OSA.  Autoimmune serologies negative.  RHC with PAH, PVR 7.6 WU.  Cause of PAH is uncertain, concerned for component of group 1 PH as mild OSA and mild COPD do not appear to explain.  There has been some concern that she could have sarcoidosis, but CT chest is not suggestive of this.  She is on home oxygen, currently on 12 L.  Overall, breathing better.  I do think that it would be reasonable at this point to treat for group 1 PH with trial of selective pulmonary vasodilators.  - Can start Opsumit 10 mg daily, will work on making sure she can get this at home.  - Treatment of OSA and COPD per pulmonary.  - ?any further workup of sarcoidosis => per pulmonary.  - No further Lasix, low filling  pressures.  2. COPD: Mild, currently treating for component of exacerbation with prednisone taper and bronchodilators.  3. OSA: Mild.  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  - On ASA/Plavix and Zocor.   Length of Stay: [place], MD  09/01/2020, 8:55 AM  Advanced Heart Failure Team Pager [phone] (M-F; 7a - 5p)  Please contact [facility] for night-coverage after hours (5p -7a ) and weekends on [email or web address]

## 2020-09-01 NOTE — Progress Notes (Signed)
OT Cancellation Note  Patient Details Name: [name] MRN: [id number] DOB: November 12, 1940   Cancelled Treatment:    Reason Eval/Treat Not Completed: Patient at procedure or test/ unavailable (Pt in cath lab.)  [name] 09/01/2020, 8:24 AM  [name], OTR/L Acute Rehabilitation Services Pager: [phone] Office: [phone]

## 2020-09-01 NOTE — Progress Notes (Signed)
Progress Note    [name]  HKV:[id number] DOB: January 09, 1941  DOA: 08/27/2020 PCP: [name], MD    Brief Narrative:     Medical records reviewed and are as summarized below:  [name] is an 80 y.o. female with medical history significant of COPD, HTN, Hypothyroidism. She is followed by [name] for pulmonary with last OV 08/15/20. She has been having increasing SOB with increasing need for Oxygen now up to 5 L . Due to increasing symptoms she contacted her doctor who called in Augmentin and started her on oral prednisone. She continued to be short of breath with progressive tachypnea and sats of 75% on 4L. EMS activated. She was put to 15L NRB and transported to [facility] for evaluation.  On echo was found to have worsening pulmonary HTN,  Card/pulm consult appreciated.  S/p cath on 5/23  Assessment/Plan:   Principal Problem:   Acute on chronic respiratory failure with hypoxia (HCC) Active Problems:   Essential hypertension   Hypothyroidism   Pulmonary hypertension (HCC)   COPD with acute exacerbation (HCC)   Worsening pulmonary HTN with ? Fluid overload and ? COPD exacerbation  - no PFTs outpatient  -Follows with [name] for pulmonary.  -CXR clear, CTA chest w/o infiltrates             procalcitonin negative so will d/c abx             Continue home nebs             Prednisone 20 mg bid- wean to off            wean O2 to 5L as able currently on 12L HF  -BNP elevated --s/p IV lasix x 1 with good output  -? Recent COVID infection 4 months ago?- patient denies  -last echo 9/21: moderately elevated pulmonary artery systolic pressure-- repeat  echo is severe pulm HTN with grade 1 dHF  Pulmonary consult placed as well as cards--cards consult: s/p cath: severe pulm  HTN- Can start Opsumit 10 mg daily, will work on making sure she can get this at  home   HTN- hold home meds  Hypothyroidism -  recent TSH in range. - continue synthroid dose.  GERD: PPI  added, seen by speech- no swallowing issues   Family Communication/Anticipated D/C date and plan/Code Status   DVT prophylaxis: Lovenox ordered. Code Status: Full Code.  Disposition Plan: Status is: Inpatient Family at bedside Remains inpatient appropriate because:Inpatient level of care appropriate due to severity of illness   Dispo: The patient is from: Home              Anticipated d/c is to: Home              Patient currently is not medically stable to d/c.   Difficult to place patient No         Medical Consultants:    PCCM  cards    Subjective:   No complaints  Objective:    Vitals:   09/01/20 0000 09/01/20 0505 09/01/20 0810 09/01/20 0857  BP: 134/83 131/63  131/63  Pulse: 70 68  82  Resp: (!) 23 (!) 21  15  Temp: 98 F (36.7 C) 98.1 F (36.7 C)  97.8 F (36.6 C)  TempSrc: Oral Oral  Oral  SpO2: 93% 98% 96% 98%  Weight:  59.6 kg    Height:        Intake/Output Summary (Last 24  hours) at 09/01/2020 1054 Last data filed at 09/01/2020 0529 Gross per 24 hour  Intake 240 ml  Output 1000 ml  Net -760 ml   Filed Weights   08/27/20 1433 08/28/20 1317 09/01/20 0505  Weight: 58.8 kg 58.8 kg 59.6 kg    Exam:  General: Appearance:    elderly female in no acute distress     Lungs:      respirations unlabored  Heart:    Normal heart rate. Normal rhythm. No murmurs, rubs, or gallops.   MS:   All extremities are intact.   Neurologic:   Pleasant and cooperative     Data Reviewed:   I have personally reviewed following labs and imaging studies:  Labs: Labs show the following:   Basic Metabolic Panel: Recent Labs  Lab 08/27/20 1451 08/29/20 0248 08/30/20 0936 09/01/20 0139  NA 139 138 138 136  K 4.1 4.5 3.8 4.4  CL 113* 109 109 109  CO2 15* 21* 20* 20*  GLUCOSE 130* 132* 118* 93  BUN 9 24* 31* 28*  CREATININE 0.79 1.02* 1.02* 0.80  CALCIUM 9.0 9.5 9.3 9.2   GFR Estimated Creatinine Clearance: 43.6 mL/min (by C-G formula based on  SCr of 0.8 mg/dL). Liver Function Tests: No results for input(s): AST, ALT, ALKPHOS, BILITOT, PROT, ALBUMIN in the last 168 hours. No results for input(s): LIPASE, AMYLASE in the last 168 hours. No results for input(s): AMMONIA in the last 168 hours. Coagulation profile No results for input(s): INR, PROTIME in the last 168 hours.  CBC: Recent Labs  Lab 08/27/20 1451 08/29/20 0248 09/01/20 0139  WBC 12.3* 15.3* 14.2*  NEUTROABS 10.2*  --   --   HGB 12.4 11.7* 13.6  HCT 39.0 36.8 41.4  MCV 99.2 96.3 93.7  PLT 247 257 269   Cardiac Enzymes: No results for input(s): CKTOTAL, CKMB, CKMBINDEX, TROPONINI in the last 168 hours. BNP (last 3 results) No results for input(s): PROBNP in the last 8760 hours. CBG: No results for input(s): GLUCAP in the last 168 hours. D-Dimer: No results for input(s): DDIMER in the last 72 hours. Hgb A1c: No results for input(s): HGBA1C in the last 72 hours. Lipid Profile: No results for input(s): CHOL, HDL, LDLCALC, TRIG, CHOLHDL, LDLDIRECT in the last 72 hours. Thyroid function studies: No results for input(s): TSH, T4TOTAL, T3FREE, THYROIDAB in the last 72 hours.  Invalid input(s): FREET3 Anemia work up: No results for input(s): VITAMINB12, FOLATE, FERRITIN, TIBC, IRON, RETICCTPCT in the last 72 hours. Sepsis Labs: Recent Labs  Lab 08/27/20 1451 08/28/20 1406 08/29/20 0248 09/01/20 0139  PROCALCITON  --  <0.10  --   --   WBC 12.3*  --  15.3* 14.2*    Microbiology Recent Results (from the past 240 hour(s))  Resp Panel by RT-PCR (Flu A&B, Covid) Nasopharyngeal Swab     Status: None   Collection Time: 08/27/20  3:03 PM   Specimen: Nasopharyngeal Swab; Nasopharyngeal(NP) swabs in vial transport medium  Result Value Ref Range Status   SARS Coronavirus 2 by RT PCR NEGATIVE NEGATIVE Final    Comment: (NOTE) SARS-CoV-2 target nucleic acids are NOT DETECTED.  The SARS-CoV-2 RNA is generally detectable in upper respiratory specimens during the  acute phase of infection. The lowest concentration of SARS-CoV-2 viral copies this assay can detect is 138 copies/mL. A negative result does not preclude SARS-Cov-2 infection and should not be used as the sole basis for treatment or other patient management decisions. A negative result may occur  with  improper specimen collection/handling, submission of specimen other than nasopharyngeal swab, presence of viral mutation(s) within the areas targeted by this assay, and inadequate number of viral copies(<138 copies/mL). A negative result must be combined with clinical observations, patient history, and epidemiological information. The expected result is Negative.  Fact Sheet for Patients:  EntrepreneurPulse.com.au  Fact Sheet for Healthcare Providers:  IncredibleEmployment.be  This test is no t yet approved or cleared by the [place] FDA and  has been authorized for detection and/or diagnosis of SARS-CoV-2 by FDA under an Emergency Use Authorization (EUA). This EUA will remain  in effect (meaning this test can be used) for the duration of the COVID-19 declaration under Section 564(b)(1) of the Act, 21 U.S.C.section 360bbb-3(b)(1), unless the authorization is terminated  or revoked sooner.       Influenza A by PCR NEGATIVE NEGATIVE Final   Influenza B by PCR NEGATIVE NEGATIVE Final    Comment: (NOTE) The Xpert Xpress SARS-CoV-2/FLU/RSV plus assay is intended as an aid in the diagnosis of influenza from Nasopharyngeal swab specimens and should not be used as a sole basis for treatment. Nasal washings and aspirates are unacceptable for Xpert Xpress SARS-CoV-2/FLU/RSV testing.  Fact Sheet for Patients: EntrepreneurPulse.com.au  Fact Sheet for Healthcare Providers: IncredibleEmployment.be  This test is not yet approved or cleared by the [place] FDA and has been authorized for detection and/or  diagnosis of SARS-CoV-2 by FDA under an Emergency Use Authorization (EUA). This EUA will remain in effect (meaning this test can be used) for the duration of the COVID-19 declaration under Section 564(b)(1) of the Act, 21 U.S.C. section 360bbb-3(b)(1), unless the authorization is terminated or revoked.  Performed at [facility], [place] [address], [place], [place] [address]   MRSA PCR Screening     Status: Abnormal   Collection Time: 09/01/20  5:49 AM   Specimen: Nasal Mucosa; Nasopharyngeal  Result Value Ref Range Status   MRSA by PCR POSITIVE (A) NEGATIVE Final    Comment:        The GeneXpert MRSA Assay (FDA approved for NASAL specimens only), is one component of a comprehensive MRSA colonization surveillance program. It is not intended to diagnose MRSA infection nor to guide or monitor treatment for MRSA infections. RESULT CALLED TO, READ BACK BY AND VERIFIED WITH: RN K.HELL AT 0800 ON 09/01/2020 BY T.SAAD. Performed at [facility], [place] [address] [place], [place] [address]     Procedures and diagnostic studies:  CARDIAC CATHETERIZATION  Result Date: 09/01/2020 1. Low filling pressures. 2. Severe pulmonary arterial hypertension.   CT Chest High Resolution  Result Date: 08/30/2020 CLINICAL DATA:  Inpatient. Suspected sarcoidosis. Dyspnea and hypoxia on recent chest CT report. EXAM: CT CHEST WITHOUT CONTRAST TECHNIQUE: Multidetector CT imaging of the chest was performed following the standard protocol without intravenous contrast. High resolution imaging of the lungs, as well as inspiratory and expiratory imaging, was performed. COMPARISON:  08/27/2020 chest CT angiogram. FINDINGS: Cardiovascular: Normal heart size. No significant pericardial effusion/thickening. Three-vessel coronary atherosclerosis. Right brachiocephalic artery stent. Atherosclerotic nonaneurysmal thoracic aorta. Top-normal caliber main pulmonary artery (3.2 cm diameter). Mediastinum/Nodes: No  discrete thyroid nodules. Unremarkable esophagus. No axillary adenopathy. Mildly enlarged 1.0 cm short axis diameter right paratracheal node (series 5/image 54), slightly decreased from 1.2 cm on 08/27/2020 chest CT. Mildly enlarged 1.0 cm left paratracheal node (series 5/image 55), mildly decreased from 1.2 cm. Previously mildly enlarged subcarinal node is decreased and no longer enlarged. No new pathologically enlarged  mediastinal nodes. No discrete hilar adenopathy on these noncontrast images. Lungs/Pleura: No pneumothorax. No pleural effusion. Subpleural anterior right upper lobe solid 0.5 cm pulmonary nodule (series 6/image 30), unchanged since 05/01/2020 chest CT. Separate anterior right upper lobe 0.4 cm solid pulmonary nodule (series 6/image 42), decreased from 0.6 cm on 05/01/2020 chest CT. No acute consolidative airspace disease, lung masses or new significant pulmonary nodules. Small parenchymal bands in the dependent lower lobes bilaterally are unchanged. Mild patchy subpleural reticulation in both lungs is unchanged. No significant regions of traction bronchiectasis, architectural distortion or frank honeycombing. No significant lobular air trapping or evidence of tracheobronchomalacia on the expiration sequence. Mild centrilobular emphysema with diffuse bronchial wall thickening. These findings are not appreciably changed since 05/01/2020 chest CT. Upper abdomen: No acute abnormality. Coarsely calcified nonenlarged porta hepatis node is unchanged. Musculoskeletal: No aggressive appearing focal osseous lesions. Marked thoracic spondylosis. IMPRESSION: 1. Mild noncalcified mediastinal lymphadenopathy is mildly decreased since 08/27/2020 chest CT, nonspecific, likely benign reactive adenopathy. 2. No compelling findings of interstitial lung disease. Mild patchy subpleural reticulation in both lungs and parenchymal bands in the dependent lower lobes is unchanged and nonspecific, favoring mild nonspecific  postinfectious/postinflammatory scarring. 3. Mild centrilobular emphysema with diffuse bronchial wall thickening, suggesting COPD. 4. Small right upper lobe solid pulmonary nodules are stable to decreased since 05/01/2020 chest CT, more likely benign. Follow-up noncontrast chest CT recommended in 12-18 months. 5. Aortic Atherosclerosis (ICD10-I70.0) and Emphysema (ICD10-J43.9). Electronically Signed   By: Ilona Sorrel M.D.   On: 08/30/2020 20:06    Medications:   . amLODipine  5 mg Oral Daily  . arformoterol  15 mcg Nebulization BID  . aspirin  81 mg Oral Daily  . budesonide  0.5 mg Nebulization BH-qamhs  . clopidogrel  75 mg Oral Daily  . diclofenac Sodium  2 g Topical QID  . [START ON 09/02/2020] enoxaparin (LOVENOX) injection  40 mg Subcutaneous Q24H  . gabapentin  100 mg Oral QHS  . levothyroxine  75 mcg Oral Q0600  . loratadine  10 mg Oral Daily  . macitentan  10 mg Oral Daily  . mouth rinse  15 mL Mouth Rinse BID  . metoprolol succinate  25 mg Oral Daily  . mupirocin ointment  1 application Nasal BID  . pantoprazole  40 mg Oral Daily  . predniSONE  20 mg Oral Q breakfast  . simvastatin  20 mg Oral q1800  . sodium chloride flush  3 mL Intravenous Q12H  . sodium chloride flush  3 mL Intravenous Q12H  . sodium chloride flush  3 mL Intravenous Q12H   Continuous Infusions: . sodium chloride 250 mL (08/30/20 0051)  . sodium chloride       LOS: 5 days   Geradine Girt  [facility]   How to contact the [facility] Attending or Consulting provider [place] or covering provider during after hours [place], for this patient?  1. Check the care team in [facility] and look for a) attending/consulting TR[facility] provider listed and b) the [facility] team listed 2. Log into [email or web address] and use [place]'s universal password to access. If you do not have the password, please contact the hospital operator. 3. Locate the [facility] provider you are looking for under [facility] and page to a number that you  can be directly reached. 4. If you still have difficulty reaching the provider, please page the [facility] (Director on Call) for the Hospitalists listed on amion for assistance.  09/01/2020, 10:54 AM

## 2020-09-01 NOTE — Progress Notes (Signed)
PT Cancellation Note  Patient Details Name: [name] MRN: [id number] DOB: Jun 04, 1940   Cancelled Treatment:    Reason Eval/Treat Not Completed: Other (comment) just got back from cardiac cath and likely on bed rest- will hold for this morning and f/u later if appropriate.    Windell Norfolk, DPT, PN1   Supplemental Physical Therapist [facility]    Pager [phone] Acute Rehab Office [phone]

## 2020-09-01 NOTE — Progress Notes (Signed)
NAME:  [name], MRN:  [id number], DOB:  July 17, 1940, LOS: 5 ADMISSION DATE:  08/27/2020, CONSULTATION DATE:  5/21 REFERRING MD:  Lucianne Lei, CHIEF COMPLAINT:  Dyspnea   History of Present Illness:  80 y/o female admitted with dyspnea in setting of acute on chronic pulmonary hypertension.  Has had an extensive work up as outpatient.  At baseline has mild emphysema and OSA, but no ILD.    Pertinent  Medical History  COPD GERD Pulmonary hypertension  Significant Hospital Events: Including procedures, antibiotic start and stop dates in addition to other pertinent events   . 5/20 admitted, PCCM consult 5/21 . [place] 5/23 see below  Interim History / Subjective:  Feels much better Wants to go home  Objective   Blood pressure 131/63, pulse 82, temperature 97.8 F (36.6 C), temperature source Oral, resp. rate 15, height _0  (1.575 m), weight 59.6 kg, SpO2 98 %.        Intake/Output Summary (Last 24 hours) at 09/01/2020 0930 Last data filed at 09/01/2020 1856 Gross per 24 hour  Intake 240 ml  Output 1000 ml  Net -760 ml   Filed Weights   08/27/20 1433 08/28/20 1317 09/01/20 0505  Weight: 58.8 kg 58.8 kg 59.6 kg    Examination: General:  Frail elderly female resting comfortably in bed HENT: NCAT OP clear PULM: No wheezing, slight R base crackles, otherwise normal effort CV: RRR, no mgr GI: BS+, soft, nontender MSK: normal bulk and tone Neuro: awake, alert, no distress, MAEW   Labs/imaging that I havepersonally reviewed  (right click and "Reselect all SmartList Selections" daily)  CTA 5/18-no pulmonary embolism mild emphysema, distal atelectasis and scattered mediastinal and hilar lymphadenopathy that is stable.  Echocardiogram 08/29/2020- LVEF 60 to 65%, mild concentric LVH, grade 1 diastolic dysfunction.  33-year-old pulmonary hypertension, estimated RVSP 73.3  High-res CT 08/30/2020- RUL nodules, pleural based, mild emphysema and airway thickening, some reticular changes  in upper lobes and some lower lobe non-specific interstitial changes without honeycombing or traction bronchiectasis  RHC 09/01/2020> RA mean 1 RV 67/1 PA 72/13, mean 36 PCWP mean 1  Oxygen saturations: PA 74% AO 99%  Cardiac Output (Fick) 4.61  Cardiac Index (Fick) 2.88 PVR 7.6 WU  Outpatient work-up Sleep study 03/19/2020- AHI 5.5 with desats  VQ scan 12/27/2019-low probability for PE  Lower extremity duplex 12/27/19 negative for DVT  Autoimmune serologies 12/27/2019-negative  Unable to complete PFTs  Resolved Hospital Problem list     Assessment & Plan:  Acute on chronic hypoxemic respiratory failure due to complex picture of mild emphysema, non-specific interstitial lung disease and underlying pulmonary artery hypertension> with symptomatic improvement assume the prednisone and antibiotics helped, so perhaps the acute decompensation was related to a COPD exacerbation?  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  Would be nice to have spirometry data at a minimum. Perhaps she can repeat PFT as outpatient > outpatient PFT > macitentan started by cardiology today > will need outpatient LFT monitoring > will need to monitor daily weights, fluid and Na intake at home  Chronic respiraotry failure with hypoxemia: resting O2 requirement improved  (was on 7 liters when I came to her room today) > would have her get up and move, will place home O2 assessment  Best practice (right click and "Reselect all SmartList Selections" daily)  Per primary  Labs   CBC: Recent Labs  Lab 08/27/20 1451 08/29/20 0248 09/01/20 0139  WBC 12.3* 15.3* 14.2*  NEUTROABS 10.2*  --   --   HGB 12.4 11.7* 13.6  HCT 39.0 36.8 41.4  MCV 99.2 96.3 93.7  PLT 247 257 628    Basic Metabolic  Panel: Recent Labs  Lab 08/27/20 1451 08/29/20 0248 08/30/20 0936 09/01/20 0139  NA 139 138 138 136  K 4.1 4.5 3.8 4.4  CL 113* 109 109 109  CO2 15* 21* 20* 20*  GLUCOSE 130* 132* 118* 93  BUN 9 24* 31* 28*  CREATININE 0.79 1.02* 1.02* 0.80  CALCIUM 9.0 9.5 9.3 9.2   GFR: Estimated Creatinine Clearance: 43.6 mL/min (by C-G formula based on SCr of 0.8 mg/dL). Recent Labs  Lab 08/27/20 1451 08/28/20 1406 08/29/20 0248 09/01/20 0139  PROCALCITON  --  <0.10  --   --   WBC 12.3*  --  15.3* 14.2*    Liver Function Tests: No results for input(s): AST, ALT, ALKPHOS, BILITOT, PROT, ALBUMIN in the last 168 hours. No results for input(s): LIPASE, AMYLASE in the last 168 hours. No results for input(s): AMMONIA in the last 168 hours.  ABG    Component Value Date/Time   PHART 7.409 12/25/2019 2218   PCO2ART 30.1 (L) 12/25/2019 2218   PO2ART 73 (L) 12/25/2019 2218   HCO3 19.0 (L) 12/25/2019 2218   TCO2 20 (L) 12/25/2019 2218   ACIDBASEDEF 4.0 (H) 12/25/2019 2218   O2SAT 95.0 12/25/2019 2218     Coagulation Profile: No results for input(s): INR, PROTIME in the last 168 hours.  Cardiac Enzymes: No results for input(s): CKTOTAL, CKMB, CKMBINDEX, TROPONINI in the last 168 hours.  HbA1C: Hgb A1c MFr Bld  Date/Time Value Ref Range Status  12/26/2019 07:09 AM 5.7 (H) 4.8 - 5.6 % Final    Comment:    (NOTE) Pre diabetes:          5.7%-6.4%  Diabetes:              >6.4%  Glycemic control for   <7.0% adults with diabetes   04/17/2008 10:40 PM (H) 4.6 - 6.1 % Final   6.2 (NOTE)   The ADA recommends the following therapeutic goal for glycemic   control related to Hgb A1C measurement:   Goal of Therapy:   < 7.0% Hgb A1C   Reference: American Diabetes Association: Clinical Practice   Recommendations 2008, Diabetes Care,  2008, 31:(Suppl 1).    CBG: No results for input(s): GLUCAP in the last 168 hours.   Critical care time: n/a     Roselie Awkward, MD [place]  PCCM Pager: [phone] Cell: [phone] If no response, please call [phone] until 7pm After 7:00 pm call Elink  [phone]

## 2020-09-01 NOTE — Progress Notes (Signed)
Pharmacy Consult for Pulmonary Hypertension Treatment   Indication - Initiation of PAH medication while hospitalized  Patient is 80 y.o. and will be newly started on Macitentan (Opsumit).   In order to use this medication as an inpatient, monitoring parameters per REMS requirements must be met.  1. Chronic therapy will under the supervision of Dr.McLean who is enrolled in the REMS program and is initiating therapy.  2. On admission pregnancy risk has been assessed and no monitoring required. 3. Hepatic function has been evaluated. AST/ALT appropriate to continue medication at this time.   Hepatic Function Latest Ref Rng & Units 09/01/2020 05/04/2020 05/03/2020  Total Protein 6.5 - 8.1 g/dL 7.1 7.8 8.4(H)  Albumin 3.5 - 5.0 g/dL 3.3(L) 3.8 4.0  AST 15 - 41 U/L _0 ALT 0 - 44 U/L _1 Alk Phosphatase 38 - 126 U/L 61 59 65  Total Bilirubin 0.3 - 1.2 mg/dL 0.9 0.5 0.4  Bilirubin, Direct 0.0 - 0.2 mg/dL 0.3(H) - -     The patient has been educated on Hepatotoxicity  and REMS form has been signed and submitted to [facility] Patient Enrollment.  If any question arise or pregnancy is identified during hospitalization, contact for bosentan and macitentan: [phone]; ambrisentan: [phone].  Thank for you allowing [place] to participate in the care of this patient.  Erin Hearing PharmD., BCPS Clinical Pharmacist 09/01/2020 3:39 PM

## 2020-09-01 NOTE — Interval H&P Note (Signed)
History and Physical Interval Note:  09/01/2020 8:09 AM  [name]  has presented today for surgery, with the diagnosis of pulmonary HTN.  The various methods of treatment have been discussed with the patient and family. After consideration of risks, benefits and other options for treatment, the patient has consented to  Procedure(s): RIGHT HEART CATH (N/A) as a surgical intervention.  The patient's history has been reviewed, patient examined, no change in status, stable for surgery.  I have reviewed the patient's chart and labs.  Questions were answered to the patient's satisfaction.     Lin Glazier [employer]

## 2020-09-02 ENCOUNTER — Other Ambulatory Visit (HOSPITAL_COMMUNITY): Payer: Self-pay

## 2020-09-02 DIAGNOSIS — J9621 Acute and chronic respiratory failure with hypoxia: Secondary | ICD-10-CM | POA: Diagnosis not present

## 2020-09-02 DIAGNOSIS — I272 Pulmonary hypertension, unspecified: Secondary | ICD-10-CM | POA: Diagnosis not present

## 2020-09-02 NOTE — Consult Note (Signed)
   [facility] Inpatient Consult   09/02/2020  [name] 1940/05/03 [id number]  [facility] [ACO] Patient: Medicare CMS DCE  Primary Care Provider: Karle Plumber, MD, [facility]  Patient screened for hospitalization with noted high risk score for unplanned readmission risk and to assess for potential [facility] service needs for post hospital transition.  Review of patient's medical record reveals patient is recommended by PT for home health with 24 hour assistance.    Plan:  Continue to follow progress and disposition to assess for post hospital care management needs.    For questions contact:   Natividad Brood, RN BSN [facility] Liaison  [phone] business mobile phone Toll free office [phone]  Fax number: [phone] [name]

## 2020-09-02 NOTE — Progress Notes (Signed)
Progress Note    [name]  RHZ:[id number] DOB: 11-24-1940  DOA: 08/27/2020 PCP: [name], MD    Brief Narrative:     Medical records reviewed and are as summarized below:  [name] is an 80 y.o. female with medical history significant of COPD, HTN, Hypothyroidism. She is followed by [name] for pulmonary with last OV 08/15/20. She has been having increasing SOB with increasing need for Oxygen now up to 5 L [place]. Due to increasing symptoms she contacted her doctor who called in Augmentin and started her on oral prednisone. She continued to be short of breath with progressive tachypnea and sats of 75% on 4L. EMS activated. She was put to 15L NRB and transported to [facility] for evaluation.  On echo was found to have worsening pulmonary HTN,  Card/pulm consult appreciated.  S/p cath on 5/23.  Opsumit started.  Wean O2 as able.   Assessment/Plan:   Principal Problem:   Acute on chronic respiratory failure with hypoxia (HCC) Active Problems:   Essential hypertension   Hypothyroidism   Pulmonary hypertension (HCC)   COPD with acute exacerbation (HCC)   Worsening pulmonary HTN  -Follows with [name] for pulmonary.  -CXR clear, CTA chest w/o infiltrates -  procalcitonin negative so will d/c abx - Continue home nebs -Prednisone 20 mg - x 5 days -wean O2 to 5L  -BNP elevated --s/p IV lasix x 1 with good output -? Recent COVID infection 4 months ago?- patient denies -last echo 9/21: moderately elevated pulmonary artery systolic pressure-- repeat  echo is severe pulm HTN with grade 1 dHF -Pulmonary consult placed as well as cards--cards consult: s/p cath: severe pulm HTN- Can start Opsumit 10 mg daily   HTN- hold home meds  Hypothyroidism -  recent TSH in range. - continue synthroid dose.  GERD: PPI added, seen by speech- no swallowing issues   Family Communication/Anticipated D/C date and plan/Code Status   DVT prophylaxis: Lovenox ordered. Code  Status: Full Code.  Disposition Plan: Status is: Inpatient Family at bedside 5/23 Remains inpatient appropriate because:Inpatient level of care appropriate due to severity of illness   Dispo: The patient is from: Home              Anticipated d/c is to: Home              Patient currently is not medically stable to d/c.   Difficult to place patient No         Medical Consultants:    PCCM  cards    Subjective:   Says she feels great  Objective:    Vitals:   09/01/20 1954 09/01/20 2341 09/02/20 0321 09/02/20 0808  BP: 118/67 117/65 (!) 150/73 116/70  Pulse: 76 73 96 87  Resp: _0 Temp: 97.8 F (36.6 C) 97.8 F (36.6 C) 97.8 F (36.6 C) 97.8 F (36.6 C)  TempSrc: Axillary Axillary Axillary Oral  SpO2: 94% 97% 96% 92%  Weight:      Height:        Intake/Output Summary (Last 24 hours) at 09/02/2020 0953 Last data filed at 09/02/2020 0810 Gross per 24 hour  Intake 274.47 ml  Output 350 ml  Net -75.53 ml   Filed Weights   08/27/20 1433 08/28/20 1317 09/01/20 0505  Weight: 58.8 kg 58.8 kg 59.6 kg    Exam:  General: Appearance:    elderly female in no acute distress  Lungs:     On [place], respirations unlabored  Heart:    Normal heart rate.   MS:   All extremities are intact.   Neurologic:   Awake, alert, oriented x 3. No apparent focal neurological           defect.       Data Reviewed:   I have personally reviewed following labs and imaging studies:  Labs: Labs show the following:   Basic Metabolic Panel: Recent Labs  Lab 08/27/20 1451 08/29/20 0248 08/30/20 0936 09/01/20 0139 09/01/20 0831  NA 139 138 138 136 143  142  K 4.1 4.5 3.8 4.4 3.7  3.7  CL 113* 109 109 109  --   CO2 15* 21* 20* 20*  --   GLUCOSE 130* 132* 118* 93  --   BUN 9 24* 31* 28*  --   CREATININE 0.79 1.02* 1.02* 0.80  --   CALCIUM 9.0 9.5 9.3 9.2  --    GFR Estimated Creatinine Clearance: 43.6 mL/min (by C-G formula based on SCr of 0.8 mg/dL). Liver  Function Tests: Recent Labs  Lab 09/01/20 0139  AST 26  ALT 24  ALKPHOS 61  BILITOT 0.9  PROT 7.1  ALBUMIN 3.3*   No results for input(s): LIPASE, AMYLASE in the last 168 hours. No results for input(s): AMMONIA in the last 168 hours. Coagulation profile No results for input(s): INR, PROTIME in the last 168 hours.  CBC: Recent Labs  Lab 08/27/20 1451 08/29/20 0248 09/01/20 0139 09/01/20 0831  WBC 12.3* 15.3* 14.2*  --   NEUTROABS 10.2*  --   --   --   HGB 12.4 11.7* 13.6 14.3  14.3  HCT 39.0 36.8 41.4 42.0  42.0  MCV 99.2 96.3 93.7  --   PLT 247 257 269  --    Cardiac Enzymes: No results for input(s): CKTOTAL, CKMB, CKMBINDEX, TROPONINI in the last 168 hours. BNP (last 3 results) No results for input(s): PROBNP in the last 8760 hours. CBG: No results for input(s): GLUCAP in the last 168 hours. D-Dimer: No results for input(s): DDIMER in the last 72 hours. Hgb A1c: No results for input(s): HGBA1C in the last 72 hours. Lipid Profile: No results for input(s): CHOL, HDL, LDLCALC, TRIG, CHOLHDL, LDLDIRECT in the last 72 hours. Thyroid function studies: No results for input(s): TSH, T4TOTAL, T3FREE, THYROIDAB in the last 72 hours.  Invalid input(s): FREET3 Anemia work up: No results for input(s): VITAMINB12, FOLATE, FERRITIN, TIBC, IRON, RETICCTPCT in the last 72 hours. Sepsis Labs: Recent Labs  Lab 08/27/20 1451 08/28/20 1406 08/29/20 0248 09/01/20 0139  PROCALCITON  --  <0.10  --   --   WBC 12.3*  --  15.3* 14.2*    Microbiology Recent Results (from the past 240 hour(s))  Resp Panel by RT-PCR (Flu A&B, Covid) Nasopharyngeal Swab     Status: None   Collection Time: 08/27/20  3:03 PM   Specimen: Nasopharyngeal Swab; Nasopharyngeal(NP) swabs in vial transport medium  Result Value Ref Range Status   SARS Coronavirus 2 by RT PCR NEGATIVE NEGATIVE Final    Comment: (NOTE) SARS-CoV-2 target nucleic acids are NOT DETECTED.  The SARS-CoV-2 RNA is generally  detectable in upper respiratory specimens during the acute phase of infection. The lowest concentration of SARS-CoV-2 viral copies this assay can detect is 138 copies/mL. A negative result does not preclude SARS-Cov-2 infection and should not be used as the sole basis for treatment or other patient management decisions. A  negative result may occur with  improper specimen collection/handling, submission of specimen other than nasopharyngeal swab, presence of viral mutation(s) within the areas targeted by this assay, and inadequate number of viral copies(<138 copies/mL). A negative result must be combined with clinical observations, patient history, and epidemiological information. The expected result is Negative.  Fact Sheet for Patients:  EntrepreneurPulse.com.au  Fact Sheet for Healthcare Providers:  IncredibleEmployment.be  This test is no t yet approved or cleared by the [place] FDA and  has been authorized for detection and/or diagnosis of SARS-CoV-2 by FDA under an Emergency Use Authorization (EUA). This EUA will remain  in effect (meaning this test can be used) for the duration of the COVID-19 declaration under Section 564(b)(1) of the Act, 21 U.S.C.section 360bbb-3(b)(1), unless the authorization is terminated  or revoked sooner.       Influenza A by PCR NEGATIVE NEGATIVE Final   Influenza B by PCR NEGATIVE NEGATIVE Final    Comment: (NOTE) The Xpert Xpress SARS-CoV-2/FLU/RSV plus assay is intended as an aid in the diagnosis of influenza from Nasopharyngeal swab specimens and should not be used as a sole basis for treatment. Nasal washings and aspirates are unacceptable for Xpert Xpress SARS-CoV-2/FLU/RSV testing.  Fact Sheet for Patients: EntrepreneurPulse.com.au  Fact Sheet for Healthcare Providers: IncredibleEmployment.be  This test is not yet approved or cleared by the [place] FDA  and has been authorized for detection and/or diagnosis of SARS-CoV-2 by FDA under an Emergency Use Authorization (EUA). This EUA will remain in effect (meaning this test can be used) for the duration of the COVID-19 declaration under Section 564(b)(1) of the Act, 21 U.S.C. section 360bbb-3(b)(1), unless the authorization is terminated or revoked.  Performed at [facility], [place] [address], [place], [place] [address]   MRSA PCR Screening     Status: Abnormal   Collection Time: 09/01/20  5:49 AM   Specimen: Nasal Mucosa; Nasopharyngeal  Result Value Ref Range Status   MRSA by PCR POSITIVE (A) NEGATIVE Final    Comment:        The GeneXpert MRSA Assay (FDA approved for NASAL specimens only), is one component of a comprehensive MRSA colonization surveillance program. It is not intended to diagnose MRSA infection nor to guide or monitor treatment for MRSA infections. RESULT CALLED TO, READ BACK BY AND VERIFIED WITH: RN K.HELL AT 0800 ON 09/01/2020 BY T.SAAD. Performed at [facility], [place] [address] [place], [place] [address]     Procedures and diagnostic studies:  CARDIAC CATHETERIZATION  Result Date: 09/01/2020 1. Low filling pressures. 2. Severe pulmonary arterial hypertension.    Medications:   . amLODipine  5 mg Oral Daily  . arformoterol  15 mcg Nebulization BID  . aspirin  81 mg Oral Daily  . budesonide  0.5 mg Nebulization BH-qamhs  . clopidogrel  75 mg Oral Daily  . diclofenac Sodium  2 g Topical QID  . enoxaparin (LOVENOX) injection  40 mg Subcutaneous Q24H  . gabapentin  100 mg Oral QHS  . levothyroxine  75 mcg Oral Q0600  . loratadine  10 mg Oral Daily  . macitentan  10 mg Oral Daily  . mouth rinse  15 mL Mouth Rinse BID  . metoprolol succinate  25 mg Oral Daily  . mupirocin ointment  1 application Nasal BID  . pantoprazole  40 mg Oral Daily  . predniSONE  20 mg Oral Q breakfast  . simvastatin  20 mg Oral q1800  . sodium chloride  flush  3  mL Intravenous Q12H  . sodium chloride flush  3 mL Intravenous Q12H  . sodium chloride flush  3 mL Intravenous Q12H   Continuous Infusions: . sodium chloride Stopped (08/30/20 1709)  . sodium chloride       LOS: 6 days   Geradine Girt  [facility]   How to contact the [facility] Attending or Consulting provider [place] or covering provider during after hours [place], for this patient?  1. Check the care team in [facility] and look for a) attending/consulting [facility] provider listed and b) the [facility] team listed 2. Log into [email or web address] and use [place]'s universal password to access. If you do not have the password, please contact the hospital operator. 3. Locate the [facility] provider you are looking for under [facility] and page to a number that you can be directly reached. 4. If you still have difficulty reaching the provider, please page the [facility] (Director on Call) for the Hospitalists listed on amion for assistance.  09/02/2020, 9:53 AM

## 2020-09-02 NOTE — Progress Notes (Signed)
Physical Therapy Treatment Patient Details Name: PAGE PUCCIARELLI MRN: [id number] DOB: May 31, 1940 Today's Date: 09/02/2020    History of Present Illness 80 y.o. female brought to ED for shortness of breath 08/27/20 via EMS Sats of 75% and RR of 50 on RA that improved to 95% and rr of 30 on 10L. In  ED RR 21 O2 sats on NRB mask 93% Admitted for treatment of acute exacerbation of COPD PMH: COPD, HTN, Hypothyroidism.    PT Comments    Patient received in bed, very cooperative and pleasant, motivated to work with therapy. Able to mobilize with MinA at most without device, but very easily fatigued today. Unable to get accurate pleth on monitor for the most part, however in the moments we did have a good signal she satted between 85-91% on 8LPM. HR to 115BPM at most with activity. Left up in recliner with all needs met, chair alarm active. Will continue to follow.     Follow Up Recommendations  Home health PT;Supervision/Assistance - 24 hour     Equipment Recommendations  3in1 (PT);Wheelchair (measurements PT)    Recommendations for Other Services       Precautions / Restrictions Precautions Precautions: Other (comment) Precaution Comments: watch sats, O2 >85% per order set Restrictions Weight Bearing Restrictions: No    Mobility  Bed Mobility Overal bed mobility: Needs Assistance Bed Mobility: Supine to Sit     Supine to sit: Supervision     General bed mobility comments: supervision for safety and management of lines    Transfers Overall transfer level: Needs assistance Equipment used: None Transfers: Sit to/from Stand Sit to Stand: Min guard         General transfer comment: min guard for safety, a touch more unsteady today but able to self correct. Assist for line management.  Ambulation/Gait Ambulation/Gait assistance: Min assist Gait Distance (Feet): 12 Feet Assistive device: None Gait Pattern/deviations: Step-through pattern;Decreased step length -  right;Decreased step length - left Gait velocity: decreased   General Gait Details: slow and mildly unsteady, up to MinA for longer gait distance and very fatigued afterwards   Stairs             Wheelchair Mobility    Modified Rankin (Stroke Patients Only)       Balance Overall balance assessment: Needs assistance Sitting-balance support: No upper extremity supported;Feet supported Sitting balance-Leahy Scale: Good     Standing balance support: During functional activity Standing balance-Leahy Scale: Fair Standing balance comment: mild unsteadiness, may benefit from UE support                            Cognition Arousal/Alertness: Awake/alert Behavior During Therapy: WFL for tasks assessed/performed Overall Cognitive Status: Within Functional Limits for tasks assessed                                        Exercises      General Comments General comments (skin integrity, edema, etc.): on 8LPM O2 during session; monitor with poor pleth and ranged from 70-87%, however in the short moments we had a good signal she did sat at 85-91%. HR to 115BPM with activity.      Pertinent Vitals/Pain Pain Assessment: No/denies pain    Home Living  Prior Function            PT Goals (current goals can now be found in the care plan section) Acute Rehab PT Goals Patient Stated Goal: get back to walking around the neighborhood PT Goal Formulation: With patient Time For Goal Achievement: 09/13/20 Potential to Achieve Goals: Good Progress towards PT goals: Progressing toward goals    Frequency    Min 3X/week      PT Plan Current plan remains appropriate    Co-evaluation              AM-PAC PT "6 Clicks" Mobility   Outcome Measure  Help needed turning from your back to your side while in a flat bed without using bedrails?: None Help needed moving from lying on your back to sitting on the side of a  flat bed without using bedrails?: None Help needed moving to and from a bed to a chair (including a wheelchair)?: A Little Help needed standing up from a chair using your arms (e.g., wheelchair or bedside chair)?: A Little Help needed to walk in hospital room?: A Little Help needed climbing 3-5 steps with a railing? : A Lot 6 Click Score: 19    End of Session Equipment Utilized During Treatment: Oxygen;Gait belt Activity Tolerance: Patient tolerated treatment well Patient left: in chair;with call bell/phone within reach;with chair alarm set Nurse Communication: Mobility status PT Visit Diagnosis: Other abnormalities of gait and mobility (R26.89);Difficulty in walking, not elsewhere classified (R26.2);Muscle weakness (generalized) (M62.81)     Time: 8938-1017 PT Time Calculation (min) (ACUTE ONLY): 27 min  Charges:  $Gait Training: 8-22 mins $Therapeutic Activity: 8-22 mins                     Windell Norfolk, DPT, PN1   Supplemental Physical Therapist [place]    Pager [phone] Acute Rehab Office [phone]

## 2020-09-02 NOTE — Progress Notes (Addendum)
Patient ID: [name], female   DOB: 12/30/1940, 80 y.o.   MRN: [id number]     Advanced Heart Failure Rounding Note  PCP-Cardiologist: None   Subjective:   Started on macitentan after cath. On 8 liters [place].   Feeling a little better. Denies SOB.   RHC 5/23  Hemodynamics (mmHg) RA mean 1 RV 67/1 PA 72/13, mean 36 PCWP mean 1 Oxygen saturations: PA 74% AO 99% Cardiac Output (Fick) 4.61  Cardiac Index (Fick) 2.88 PVR 7.6 WU  Echo: EF 60-65%, mild LVH, moderate RV enlargement, mildly decreased RV systolic function, PASP 73 mmHg.   High resolution CT chest: No evidence for ILD, mild emphysema, mediastinal LAN less prominent and likely reactive.   Objective:   Weight Range: 59.6 kg Body mass index is 24.03 kg/m.   Vital Signs:   Temp:  [97.8 F (36.6 C)-98.2 F (36.8 C)] 98.2 F (36.8 C) (05/24 1144) Pulse Rate:  [73-96] 86 (05/24 1144) Resp:  [16-18] 16 (05/24 1144) BP: (116-150)/(63-73) 119/63 (05/24 1144) SpO2:  [92 %-99 %] 96 % (05/24 1144) Last BM Date: 09/01/20  Weight change: Filed Weights   08/27/20 1433 08/28/20 1317 09/01/20 0505  Weight: 58.8 kg 58.8 kg 59.6 kg    Intake/Output:   Intake/Output Summary (Last 24 hours) at 09/02/2020 1318 Last data filed at 09/02/2020 1144 Gross per 24 hour  Intake 394.47 ml  Output 350 ml  Net 44.47 ml      Physical Exam    General:   No resp difficulty HEENT: normal Neck: supple. no JVD. Carotids 2+ bilat; no bruits. No lymphadenopathy or thryomegaly appreciated. Cor: PMI nondisplaced. Regular rate & rhythm. No rubs, gallops or murmurs. Lungs: clear on 8 liters [place]  Abdomen: soft, nontender, nondistended. No hepatosplenomegaly. No bruits or masses. Good bowel sounds. Extremities: no cyanosis, clubbing, rash, edema Neuro: alert & orientedx3, cranial nerves grossly intact. moves all 4 extremities w/o difficulty. Affect pleasant   Telemetry   NSR 80s    Labs    CBC Recent Labs    09/01/20 0139  09/01/20 0831  WBC 14.2*  --   HGB 13.6 14.3  14.3  HCT 41.4 42.0  42.0  MCV 93.7  --   PLT 269  --    Basic Metabolic Panel Recent Labs    09/01/20 0139 09/01/20 0831  NA 136 143  142  K 4.4 3.7  3.7  CL 109  --   CO2 20*  --   GLUCOSE 93  --   BUN 28*  --   CREATININE 0.80  --   CALCIUM 9.2  --    Liver Function Tests Recent Labs    09/01/20 0139  AST 26  ALT 24  ALKPHOS 61  BILITOT 0.9  PROT 7.1  ALBUMIN 3.3*   No results for input(s): LIPASE, AMYLASE in the last 72 hours. Cardiac Enzymes No results for input(s): CKTOTAL, CKMB, CKMBINDEX, TROPONINI in the last 72 hours.  BNP: BNP (last 3 results) Recent Labs    05/01/20 1232 08/28/20 1406 08/30/20 0936  BNP 80.9 218.6* 107.9*    ProBNP (last 3 results) No results for input(s): PROBNP in the last 8760 hours.   D-Dimer No results for input(s): DDIMER in the last 72 hours. Hemoglobin A1C No results for input(s): HGBA1C in the last 72 hours. Fasting Lipid Panel No results for input(s): CHOL, HDL, LDLCALC, TRIG, CHOLHDL, LDLDIRECT in the last 72 hours. Thyroid Function Tests No results for input(s): TSH, T4TOTAL, T3FREE,  THYROIDAB in the last 72 hours.  Invalid input(s): FREET3  Other results:   Imaging    No results found.   Medications:     Scheduled Medications: . amLODipine  5 mg Oral Daily  . arformoterol  15 mcg Nebulization BID  . aspirin  81 mg Oral Daily  . budesonide  0.5 mg Nebulization BH-qamhs  . clopidogrel  75 mg Oral Daily  . diclofenac Sodium  2 g Topical QID  . enoxaparin (LOVENOX) injection  40 mg Subcutaneous Q24H  . gabapentin  100 mg Oral QHS  . levothyroxine  75 mcg Oral Q0600  . loratadine  10 mg Oral Daily  . macitentan  10 mg Oral Daily  . mouth rinse  15 mL Mouth Rinse BID  . metoprolol succinate  25 mg Oral Daily  . mupirocin ointment  1 application Nasal BID  . pantoprazole  40 mg Oral Daily  . simvastatin  20 mg Oral q1800  . sodium chloride  flush  3 mL Intravenous Q12H    Infusions:   PRN Medications: acetaminophen, albuterol, diazepam, docusate sodium, fluticasone, guaiFENesin-dextromethorphan, ondansetron (ZOFRAN) IV   Assessment/Plan   1. Pulmonary hypertension:  Echo (5/22) with EF 60-65%, mild LVH, moderate RV enlargement, mildly decreased RV systolic function, PASP 73 mmHg. High resolution CT chest with no evidence for ILD, mild emphysema, mediastinal LAN less prominent than on prior CT and likely reactive. She had a V/Q scan in 9/21 that was not suggestive of chronic PEs and lower extremity venous dopplers with no DVT.  Unable to obtain PFTs.  She has mild OSA.  Autoimmune serologies negative.  RHC with PAH, PVR 7.6 WU.  Cause of PAH is uncertain, concerned for component of group 1 PH as mild OSA and mild COPD do not appear to explain.  There has been some concern that she could have sarcoidosis, but CT chest is not suggestive of this.  She is on home 5 liters oxygen, currently on 8 L.  Per Dr Aundra Dubin-- .  I do think that it would be reasonable at this point to treat for group 1 PH with trial of selective pulmonary vasodilators. Started opsumit 09/01/20 . Patient assistance submitted. Specialty pharmacy will deliver once apprvoed.  - Continue Opsumit 10 mg daily, will work on making sure she can get this at home.  - Treatment of OSA and COPD per pulmonary.  - ?any further workup of sarcoidosis => per pulmonary.  - No further Lasix, low filling pressures.  2. COPD: Mild, currently treating for component of exacerbation with prednisone taper and bronchodilators.  3. OSA: Mild.  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  - On ASA/Plavix and Zocor.   Length of Stay: [place], NP  09/02/2020, 1:18 PM  Advanced Heart Failure Team Pager [phone] (M-F; 7a - 5p)  Please contact [facility] for night-coverage  after hours (5p -7a ) and weekends on [email or web address]  Patient seen with NP, agree with the above note.   Severe PAH on RHC yesterday with normal filling pressures and PVR 7.6 WU.  Started on Opsumit.   She is feeling better today, oxygen down to 8 L/min.    General: NAD Neck: No JVD, no thyromegaly or thyroid nodule.  Lungs: Mildly decreased BS bilaterally.  CV: Nondisplaced PMI.  Heart regular S1/S2, no S3/S4, no murmur.  No peripheral edema.   Abdomen: Soft, nontender,  no hepatosplenomegaly, no distention.  Skin: Intact without lesions or rashes.  Neurologic: Alert and oriented x 3.  Psych: Normal affect. Extremities: No clubbing or cyanosis.  HEENT: Normal.   Underlying lung disease has not appeared markedly severe, with mild COPD.  There is still question of possible sarcoidosis.  PAH looks out of proportion to parenchymal lung disease and we have started treatment with selective pulmonary vasodilators.  She is on Opsumit.   - I am going to add low dose tadalafil 20 mg daily.    She is being treated with steroids/bronchodilators for possible component of AECOPD.   Loralie Champagne 09/02/2020 4:57 PM

## 2020-09-02 NOTE — Progress Notes (Signed)
NAME:  [name], MRN:  [id number], DOB:  April 23, 1940, LOS: 6 ADMISSION DATE:  08/27/2020, CONSULTATION DATE:  5/21 REFERRING MD:  Lucianne Lei, CHIEF COMPLAINT:  Dyspnea   History of Present Illness:  80 y/o female admitted with dyspnea in setting of acute on chronic pulmonary hypertension.  Has had an extensive work up as outpatient.  At baseline has mild emphysema and OSA, but no ILD.    Pertinent  Medical History  COPD GERD Pulmonary hypertension  Significant Hospital Events: Including procedures, antibiotic start and stop dates in addition to other pertinent events   . 5/20 admitted, PCCM consult 5/21 . [place] 5/23 see below  Interim History / Subjective:   Feels OK this morning Slept OK No cough  Objective   Blood pressure 116/70, pulse 87, temperature 97.8 F (36.6 C), temperature source Oral, resp. rate 18, height 5' 2" (1.575 m), weight 59.6 kg, SpO2 92 %.        Intake/Output Summary (Last 24 hours) at 09/02/2020 0945 Last data filed at 09/02/2020 0810 Gross per 24 hour  Intake 274.47 ml  Output 350 ml  Net -75.53 ml   Filed Weights   08/27/20 1433 08/28/20 1317 09/01/20 0505  Weight: 58.8 kg 58.8 kg 59.6 kg    General:  Frail, elderly female resting comfortably in bed HENT: NCAT OP clear PULM: No appreciable crackles today, clear lungs, normal effort CV: RRR, no mgr GI: BS+, soft, nontender MSK: normal bulk and tone Neuro: awake, alert, no distress, MAEW    Labs/imaging that I havepersonally reviewed  (right click and "Reselect all SmartList Selections" daily)  CTA 5/18-no pulmonary embolism mild emphysema, distal atelectasis and scattered mediastinal and hilar lymphadenopathy that is stable.  Echocardiogram 08/29/2020- LVEF 60 to 65%, mild concentric LVH, grade 1 diastolic dysfunction.  27-year-old pulmonary hypertension, estimated RVSP 73.3  High-res CT 08/30/2020- RUL nodules, pleural based, mild emphysema and airway thickening, some reticular changes in  upper lobes and some lower lobe non-specific interstitial changes without honeycombing or traction bronchiectasis  RHC 09/01/2020> RA mean 1 RV 67/1 PA 72/13, mean 36 PCWP mean 1  Oxygen saturations: PA 74% AO 99%  Cardiac Output (Fick) 4.61  Cardiac Index (Fick) 2.88 PVR 7.6 WU  Outpatient work-up Sleep study 03/19/2020- AHI 5.5 with desats  VQ scan 12/27/2019-low probability for PE  Lower extremity duplex 12/27/19 negative for DVT  Autoimmune serologies 12/27/2019-negative  Unable to complete PFTs  Resolved Hospital Problem list     Assessment & Plan:  Acute on chronic hypoxemic respiratory failure due to complex picture of mild emphysema, non-specific interstitial lung disease and underlying pulmonary artery hypertension> with symptomatic improvement assume the prednisone and antibiotics helped, so perhaps the acute decompensation was related to a COPD exacerbation? In further review of images it has been noted that she has had lymphadenopathy and non-specific fibrotic changes which have not progressed.  Could she have sarcoid? It would be very unusual for it to present at this age but we have no other unifying diagnosis.  Would prefer to perform a bronchoscopy but her oxygen requirements are too high for that right now.   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  Would be nice to have spirometry data at a minimum. Perhaps she can repeat PFT as  outpatient > needs outpatient PFT > macitentan per cardiology > will need outpatient LFT monitoring > daily weights, education on Na/Fluid restriction  Chronic respiraotry failure with hypoxemia: resting O2 requirement improved  (was on 7 liters when I came to her room today) > wean off O2 for O2 saturation > 88% > out of bed as tolerated  Best practice (right click and "Reselect all SmartList Selections" daily)   Per primary  Labs   CBC: Recent Labs  Lab 08/27/20 1451 08/29/20 0248 09/01/20 0139 09/01/20 0831  WBC 12.3* 15.3* 14.2*  --   NEUTROABS 10.2*  --   --   --   HGB 12.4 11.7* 13.6 14.3  14.3  HCT 39.0 36.8 41.4 42.0  42.0  MCV 99.2 96.3 93.7  --   PLT 247 257 269  --     Basic Metabolic Panel: Recent Labs  Lab 08/27/20 1451 08/29/20 0248 08/30/20 0936 09/01/20 0139 09/01/20 0831  NA 139 138 138 136 143  142  K 4.1 4.5 3.8 4.4 3.7  3.7  CL 113* 109 109 109  --   CO2 15* 21* 20* 20*  --   GLUCOSE 130* 132* 118* 93  --   BUN 9 24* 31* 28*  --   CREATININE 0.79 1.02* 1.02* 0.80  --   CALCIUM 9.0 9.5 9.3 9.2  --    GFR: Estimated Creatinine Clearance: 43.6 mL/min (by C-G formula based on SCr of 0.8 mg/dL). Recent Labs  Lab 08/27/20 1451 08/28/20 1406 08/29/20 0248 09/01/20 0139  PROCALCITON  --  <0.10  --   --   WBC 12.3*  --  15.3* 14.2*    Liver Function Tests: Recent Labs  Lab 09/01/20 0139  AST 26  ALT 24  ALKPHOS 61  BILITOT 0.9  PROT 7.1  ALBUMIN 3.3*   No results for input(s): LIPASE, AMYLASE in the last 168 hours. No results for input(s): AMMONIA in the last 168 hours.  ABG    Component Value Date/Time   PHART 7.409 12/25/2019 2218   PCO2ART 30.1 (L) 12/25/2019 2218   PO2ART 73 (L) 12/25/2019 2218   HCO3 22.7 09/01/2020 0831   HCO3 22.9 09/01/2020 0831   TCO2 24 09/01/2020 0831   TCO2 24 09/01/2020 0831   ACIDBASEDEF 2.0 09/01/2020 0831   ACIDBASEDEF 2.0 09/01/2020 0831   O2SAT 73.0 09/01/2020 0831   O2SAT 75.0  09/01/2020 0831     Coagulation Profile: No results for input(s): INR, PROTIME in the last 168 hours.  Cardiac Enzymes: No results for input(s): CKTOTAL, CKMB, CKMBINDEX, TROPONINI in the last 168 hours.  HbA1C: Hgb A1c MFr Bld  Date/Time Value Ref Range Status  12/26/2019 07:09 AM 5.7 (H) 4.8 - 5.6 % Final    Comment:    (NOTE) Pre diabetes:          5.7%-6.4%  Diabetes:              >6.4%  Glycemic control for   <7.0% adults with diabetes   04/17/2008 10:40 PM (H) 4.6 - 6.1 % Final   6.2 (NOTE)   The ADA recommends the following therapeutic goal for glycemic   control related to Hgb A1C measurement:   Goal of Therapy:   < 7.0%  Hgb A1C   Reference: American Diabetes Association: Clinical Practice   Recommendations 2008, Diabetes Care,  2008, 31:(Suppl 1).    CBG: No results for input(s): GLUCAP in the last 168 hours.   Critical care time: n/a     Roselie Awkward, MD [facility] Pager: [phone] Cell: [phone] If no response, please call [phone] until 7pm After 7:00 pm call [place]  [phone]

## 2020-09-03 ENCOUNTER — Telehealth (HOSPITAL_COMMUNITY): Payer: Self-pay | Admitting: Pharmacy Technician

## 2020-09-03 ENCOUNTER — Telehealth: Payer: Self-pay | Admitting: Internal Medicine

## 2020-09-03 ENCOUNTER — Other Ambulatory Visit (HOSPITAL_COMMUNITY): Payer: Self-pay

## 2020-09-03 DIAGNOSIS — I272 Pulmonary hypertension, unspecified: Secondary | ICD-10-CM | POA: Diagnosis not present

## 2020-09-03 DIAGNOSIS — J9621 Acute and chronic respiratory failure with hypoxia: Secondary | ICD-10-CM | POA: Diagnosis not present

## 2020-09-03 DIAGNOSIS — J441 Chronic obstructive pulmonary disease with (acute) exacerbation: Secondary | ICD-10-CM | POA: Diagnosis not present

## 2020-09-03 LAB — HIV ANTIBODY (ROUTINE TESTING W REFLEX): HIV Screen 4th Generation wRfx: NONREACTIVE

## 2020-09-03 MED ORDER — TADALAFIL 20 MG PO TABS
20.0000 mg | ORAL_TABLET | Freq: Every day | ORAL | Status: DC
  Administered 2020-09-03 – 2020-09-06 (×4): 20 mg via ORAL
  Filled 2020-09-03 (×6): qty 1

## 2020-09-03 NOTE — Progress Notes (Signed)
Physical Therapy Treatment Patient Details Name: [name] MRN: [id number] DOB: 06/01/1940 Today's Date: 09/03/2020    History of Present Illness 80 y.o. female brought to ED for shortness of breath 08/27/20 via EMS Sats of 75% and RR of 50 on RA that improved to 95% and rr of 30 on 10L. Admitted for treatment of acute exacerbation of COPD. Pt is s/p heart cath on 5/23.  PMH: COPD, HTN, Hypothyroidism.    PT Comments    Pt progressing towards goals.  Pt tolerated increased gait training this session, but continues to be limited by SOB and decreased oxygen sats. Required seated rest in between gait trials. Sats dropping to lower 80s on 8L (oxygen tank did not have option for 7L). Required extended time to improve to 89-90% on 8L. Sats maintained at 89-90% on 7L when reconnected to wall oxygen. Current recommendations appropriate. Will continue to follow acutely.    Follow Up Recommendations  Home health PT;Supervision/Assistance - 24 hour     Equipment Recommendations  3in1 (PT);Wheelchair (measurements PT)    Recommendations for Other Services OT consult     Precautions / Restrictions Precautions Precautions: Fall;Other (comment) Precaution Comments: watch sats, O2 >85% per order set with activity, 88% at rest Restrictions Weight Bearing Restrictions: No    Mobility  Bed Mobility Overal bed mobility: Modified Independent                  Transfers Overall transfer level: Needs assistance Equipment used: None Transfers: Sit to/from Stand Sit to Stand: Min assist         General transfer comment: Min A for steadying assist  Ambulation/Gait Ambulation/Gait assistance: Min guard Gait Distance (Feet): 15 Feet (X2) Assistive device: None Gait Pattern/deviations: Step-through pattern;Decreased step length - right;Decreased step length - left Gait velocity: Decreased   General Gait Details: Slow, guarded gait. No overt LOB. Increased SOB and required seated  rest in between. Sats dropping to lower 80s on 8L (oxygen tank did not have option for 7L). Required extended time to improve to 89-90% on 8L. Sats maintained at 89-90% on 7L when reconnected to wall oxygen.   Stairs             Wheelchair Mobility    Modified Rankin (Stroke Patients Only)       Balance Overall balance assessment: Needs assistance Sitting-balance support: No upper extremity supported Sitting balance-Leahy Scale: Good     Standing balance support: During functional activity Standing balance-Leahy Scale: Fair                              Cognition Arousal/Alertness: Awake/alert Behavior During Therapy: WFL for tasks assessed/performed Overall Cognitive Status: Within Functional Limits for tasks assessed                                        Exercises      General Comments        Pertinent Vitals/Pain Pain Assessment: No/denies pain    Home Living                      Prior Function            PT Goals (current goals can now be found in the care plan section) Acute Rehab PT Goals Patient Stated Goal: to be able to walk  more PT Goal Formulation: With patient Time For Goal Achievement: 09/13/20 Potential to Achieve Goals: Good Progress towards PT goals: Progressing toward goals    Frequency    Min 3X/week      PT Plan Current plan remains appropriate    Co-evaluation              AM-PAC PT "6 Clicks" Mobility   Outcome Measure  Help needed turning from your back to your side while in a flat bed without using bedrails?: None Help needed moving from lying on your back to sitting on the side of a flat bed without using bedrails?: None Help needed moving to and from a bed to a chair (including a wheelchair)?: A Little Help needed standing up from a chair using your arms (e.g., wheelchair or bedside chair)?: A Little Help needed to walk in hospital room?: A Little Help needed climbing 3-5  steps with a railing? : A Lot 6 Click Score: 19    End of Session Equipment Utilized During Treatment: Oxygen;Gait belt Activity Tolerance: Patient limited by fatigue Patient left: in bed;with call bell/phone within reach Nurse Communication: Mobility status PT Visit Diagnosis: Other abnormalities of gait and mobility (R26.89);Difficulty in walking, not elsewhere classified (R26.2);Muscle weakness (generalized) (M62.81)     Time: 0856-9437 PT Time Calculation (min) (ACUTE ONLY): 20 min  Charges:  $Gait Training: 8-22 mins                     Lou Miner, DPT  Acute Rehabilitation Services  Pager: [phone] Office: [phone]    Rudean Hitt 09/03/2020, 4:48 PM

## 2020-09-03 NOTE — Progress Notes (Addendum)
Patient ID: [name], female   DOB: [birth year], 80 y.o.   MRN: [id number]     Advanced Heart Failure Rounding Note  PCP-Cardiologist: None   Subjective:   Started on macitentan after cath. Started on 20 mg tadalafil today. On 7  liters [place].   Feels ok. Denies SOB.   RHC 5/23  Hemodynamics (mmHg) RA mean 1 RV 67/1 PA 72/13, mean 36 PCWP mean 1 Oxygen saturations: PA 74% AO 99% Cardiac Output (Fick) 4.61  Cardiac Index (Fick) 2.88 PVR 7.6 WU  Echo: EF 60-65%, mild LVH, moderate RV enlargement, mildly decreased RV systolic function, PASP 73 mmHg.   High resolution CT chest: No evidence for ILD, mild emphysema, mediastinal LAN less prominent and likely reactive.   Objective:   Weight Range: 59.6 kg Body mass index is 24.03 kg/m.   Vital Signs:   Temp:  [97.3 F (36.3 C)-98.6 F (37 C)] 97.8 F (36.6 C) (05/25 0735) Pulse Rate:  [69-87] 83 (05/25 0959) Resp:  [16-30] 22 (05/25 0959) BP: (115-129)/(65-73) 129/65 (05/25 0735) SpO2:  [90 %-95 %] 95 % (05/25 1035) FiO2 (%):  [48 %] 48 % (05/25 1035) Last BM Date: 09/01/20  Weight change: Filed Weights   08/27/20 1433 08/28/20 1317 09/01/20 0505  Weight: 58.8 kg 58.8 kg 59.6 kg    Intake/Output:   Intake/Output Summary (Last 24 hours) at 09/03/2020 1206 Last data filed at 09/03/2020 0736 Gross per 24 hour  Intake 240 ml  Output 1000 ml  Net -760 ml      Physical Exam    General:  Sitting in the chair.  HEENT: normal Neck: supple. no JVD. Carotids 2+ bilat; no bruits. No lymphadenopathy or thryomegaly appreciated. Cor: PMI nondisplaced. Regular rate & rhythm. No rubs, gallops or murmurs. Lungs: clear on 7 liters [place].  Abdomen: soft, nontender, nondistended. No hepatosplenomegaly. No bruits or masses. Good bowel sounds. Extremities: no cyanosis, clubbing, rash, edema Neuro: alert & orientedx3, cranial nerves grossly intact. moves all 4 extremities w/o difficulty. Affect pleasant    Telemetry   SR  80-90s personally reviewed.    Labs    CBC Recent Labs    09/01/20 0139 09/01/20 0831  WBC 14.2*  --   HGB 13.6 14.3  14.3  HCT 41.4 42.0  42.0  MCV 93.7  --   PLT 269  --    Basic Metabolic Panel Recent Labs    09/01/20 0139 09/01/20 0831  NA 136 143  142  K 4.4 3.7  3.7  CL 109  --   CO2 20*  --   GLUCOSE 93  --   BUN 28*  --   CREATININE 0.80  --   CALCIUM 9.2  --    Liver Function Tests Recent Labs    09/01/20 0139  AST 26  ALT 24  ALKPHOS 61  BILITOT 0.9  PROT 7.1  ALBUMIN 3.3*   No results for input(s): LIPASE, AMYLASE in the last 72 hours. Cardiac Enzymes No results for input(s): CKTOTAL, CKMB, CKMBINDEX, TROPONINI in the last 72 hours.  BNP: BNP (last 3 results) Recent Labs    05/01/20 1232 08/28/20 1406 08/30/20 0936  BNP 80.9 218.6* 107.9*    ProBNP (last 3 results) No results for input(s): PROBNP in the last 8760 hours.   D-Dimer No results for input(s): DDIMER in the last 72 hours. Hemoglobin A1C No results for input(s): HGBA1C in the last 72 hours. Fasting Lipid Panel No results for input(s): CHOL, HDL, LDLCALC,  TRIG, CHOLHDL, LDLDIRECT in the last 72 hours. Thyroid Function Tests No results for input(s): TSH, T4TOTAL, T3FREE, THYROIDAB in the last 72 hours.  Invalid input(s): FREET3  Other results:   Imaging    No results found.   Medications:     Scheduled Medications: . amLODipine  5 mg Oral Daily  . arformoterol  15 mcg Nebulization BID  . aspirin  81 mg Oral Daily  . budesonide  0.5 mg Nebulization BH-qamhs  . clopidogrel  75 mg Oral Daily  . diclofenac Sodium  2 g Topical QID  . enoxaparin (LOVENOX) injection  40 mg Subcutaneous Q24H  . gabapentin  100 mg Oral QHS  . levothyroxine  75 mcg Oral Q0600  . loratadine  10 mg Oral Daily  . macitentan  10 mg Oral Daily  . mouth rinse  15 mL Mouth Rinse BID  . metoprolol succinate  25 mg Oral Daily  . mupirocin ointment  1 application Nasal BID  .  pantoprazole  40 mg Oral Daily  . simvastatin  20 mg Oral q1800  . sodium chloride flush  3 mL Intravenous Q12H  . tadalafil  20 mg Oral Daily    Infusions:   PRN Medications: acetaminophen, albuterol, diazepam, docusate sodium, fluticasone, guaiFENesin-dextromethorphan, ondansetron (ZOFRAN) IV   Assessment/Plan   1. Pulmonary hypertension:  Echo (5/22) with EF 60-65%, mild LVH, moderate RV enlargement, mildly decreased RV systolic function, PASP 73 mmHg. High resolution CT chest with no evidence for ILD, mild emphysema, mediastinal LAN less prominent than on prior CT and likely reactive. She had a V/Q scan in 9/21 that was not suggestive of chronic PEs and lower extremity venous dopplers with no DVT.  Unable to obtain PFTs.  She has mild OSA.  Autoimmune serologies negative.  RHC with PAH, PVR 7.6 WU.  Cause of PAH is uncertain, concerned for component of group 1 PH as mild OSA and mild COPD do not appear to explain.  There has been some concern that she could have sarcoidosis, but CT chest is not suggestive of this.  She is on home 5 liters oxygen, currently on 8 L.  Per Dr [name]-- .  I do think that it would be reasonable at this point to treat for group 1 PH with trial of selective pulmonary vasodilators.  Started opsumit 09/01/20 . Patient assistance submitted. Specialty pharmacy will deliver once apprvoed.  - Continue Opsumit 10 mg daily, will work on making sure she can get this at home.  - Started 20 mg tadalafil daily. Will need to obtain with [facility].  - Treatment of OSA and COPD per pulmonary.  - ?any further workup of sarcoidosis => per pulmonary.  - No further Lasix, low filling pressures.  2. COPD: Mild, currently treating for component of exacerbation with prednisone taper and bronchodilators.  3. OSA: Mild.  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  - On ASA/Plavix  and Zocor.   Length of Stay: [place], NP  09/03/2020, 12:06 PM  Advanced Heart Failure Team Pager [phone] (M-F; 7a - 5p)  Please contact [facility] for night-coverage after hours (5p -7a ) and weekends on [email or web address]  Patient seen with NP, agree with the above note.   Stable symptomatically. Have been treating possible AECOPD with steroids.  Now treating for component of group 1 PH, on Opsumit and will start tadalafil 20 mg daily today.  Will arrange for these meds for home.   Loralie Champagne 09/03/2020

## 2020-09-03 NOTE — TOC Progression Note (Signed)
Transition of Care (TOC) - Progression Note  Heart Failure   Patient Details  Name: [name] MRN: [id number] Date of Birth: 15-Feb-1941  Transition of Care [facility]) CM/SW [place], [place] Phone Number: 09/03/2020, 4:54 PM  Clinical Narrative:    CSW spoke with the patient at bedside and completed a very brief SDOH screening with the patient who denied having any needs at this time. Patient reported they do have a PCP and they can get to the pharmacy to pick up their medications. CSW provided the patient with the social workers name, number and position and to reach out to CSW as other social needs arise.       TOC will continue to follow throughout discharge.   Expected Discharge Plan: [place] Barriers to Discharge: Continued Medical Work up  Expected Discharge Plan and Services Expected Discharge Plan: [place]   Discharge Planning Services: CM Consult Post Acute Care Choice: [place] arrangements for the past 2 months: [place]: PT,OT [place]: [place] Date [facility] Agency Contacted: 09/03/20 Time HH Agency Contacted: 1137 Representative spoke with at [place]: Tommi Rumps   Social Determinants of Health (SDOH) Interventions Food Insecurity Interventions: Intervention Not Indicated Financial Strain Interventions: Intervention Not Indicated Housing Interventions: Intervention Not Indicated Transportation Interventions: Intervention Not Indicated,Other (Comment) (patient reports her daughter takes her to appointments)  Readmission Risk Interventions No flowsheet data found.  Alante Tolan, MSW, [place] Heart Failure Social Worker

## 2020-09-03 NOTE — Evaluation (Signed)
Occupational Therapy Evaluation Patient Details Name: [name] MRN: [id number] DOB: 02-05-1941 Today's Date: 09/03/2020    History of Present Illness 80 y.o. female brought to ED for shortness of breath 08/27/20 via EMS Sats of 75% and RR of 50 on RA that improved to 95% and rr of 30 on 10L. In  ED RR 21 O2 sats on NRB mask 93% Admitted for treatment of acute exacerbation of COPD PMH: COPD, HTN, Hypothyroidism.   Clinical Impression   Pt was independent in self care and mobility, daughter assisted with transportation and IADL prior to admission. Pt presents with generalized weakness and impaired standing balance. She requires min assist (hand held) to ambulate in room with Sp02 down to 79% with exertion, 91% at rest on 7L. Instructed in pursed lip breathing and pacing. Pt requires up to min assist for ADL. She has excellent family support for a home discharge. Will follow acutely.     Follow Up Recommendations  Home health OT;Supervision/Assistance - 24 hour    Equipment Recommendations  3 in 1 bedside commode    Recommendations for Other Services       Precautions / Restrictions Precautions Precautions: Fall;Other (comment) Precaution Comments: watch sats, O2 >85% per order set with activity, 88% at rest Restrictions Weight Bearing Restrictions: No      Mobility Bed Mobility Overal bed mobility: Modified Independent                  Transfers Overall transfer level: Needs assistance Equipment used: 1 person hand held assist Transfers: Sit to/from Stand Sit to Stand: Min assist         General transfer comment: steadying assist    Balance Overall balance assessment: Needs assistance   Sitting balance-Leahy Scale: Good Sitting balance - Comments: no LOB donning socks   Standing balance support: During functional activity Standing balance-Leahy Scale: Poor Standing balance comment: requires min (hand held assist)                            ADL either performed or assessed with clinical judgement   ADL Overall ADL's : Needs assistance/impaired Eating/Feeding: Independent;Sitting   Grooming: Wash/dry hands;Wash/dry face;Sitting;Set up   Upper Body Bathing: Minimal assistance;Sitting   Lower Body Bathing: Minimal assistance;Sit to/from stand   Upper Body Dressing : Set up;Sitting   Lower Body Dressing: Minimal assistance;Sit to/from stand Lower Body Dressing Details (indicate cue type and reason): able to don socks without assist Toilet Transfer: Minimal assistance;Stand-pivot;BSC   Toileting- Clothing Manipulation and Hygiene: Minimal assistance;Sit to/from stand       Functional mobility during ADLs: Minimal assistance General ADL Comments: Instructed in pursed lip breathing, pacing.     Vision Baseline Vision/History: Wears glasses Patient Visual Report: No change from baseline       Perception     Praxis      Pertinent Vitals/Pain Pain Assessment: No/denies pain     Hand Dominance Right   Extremity/Trunk Assessment Upper Extremity Assessment Upper Extremity Assessment: Overall WFL for tasks assessed   Lower Extremity Assessment Lower Extremity Assessment: Defer to PT evaluation   Cervical / Trunk Assessment Cervical / Trunk Assessment: Normal   Communication Communication Communication: No difficulties   Cognition Arousal/Alertness: Awake/alert Behavior During Therapy: WFL for tasks assessed/performed Overall Cognitive Status: Within Functional Limits for tasks assessed  General Comments       Exercises     Shoulder Instructions      Home Living Family/patient expects to be discharged to:: Private residence Living Arrangements: Children Available Help at Discharge: Available 24 hours/day;Family Type of Home: House Home Access: Level entry     Home Layout: One level     Bathroom Shower/Tub: Radiographer, therapeutic: Standard     Home Equipment: Civil engineer, contracting - built in   Additional Comments: Lives with her daughter      Prior Functioning/Environment Level of Independence: Needs assistance  Gait / Transfers Assistance Needed: walks without AD ADL's / Homemaking Assistance Needed: daughter assists with showers, meals and transportation            OT Problem List: Decreased activity tolerance;Decreased strength;Impaired balance (sitting and/or standing);Decreased knowledge of use of DME or AE;Cardiopulmonary status limiting activity      OT Treatment/Interventions: Self-care/ADL training;Energy conservation;DME and/or AE instruction;Therapeutic activities;Patient/family education;Balance training    OT Goals(Current goals can be found in the care plan section) Acute Rehab OT Goals Patient Stated Goal: get back to walking around the neighborhood OT Goal Formulation: With patient Time For Goal Achievement: 09/17/20 Potential to Achieve Goals: Good ADL Goals Pt Will Perform Grooming: with supervision;standing (at least 2 activities) Pt Will Perform Lower Body Bathing: with supervision;sit to/from stand Pt Will Perform Lower Body Dressing: with supervision;sit to/from stand Pt Will Transfer to Toilet: with supervision;ambulating Pt Will Perform Toileting - Clothing Manipulation and hygiene: with supervision;sit to/from stand Additional ADL Goal #1: Pt will utilize energy conservation and breathing techniques during ADL and mobility with minimal verbal cues.  OT Frequency: Min 2X/week   Barriers to D/C:            Co-evaluation              AM-PAC OT "6 Clicks" Daily Activity     Outcome Measure Help from another person eating meals?: None Help from another person taking care of personal grooming?: A Little Help from another person toileting, which includes using toliet, bedpan, or urinal?: A Little Help from another person bathing (including washing, rinsing, drying)?: A  Little Help from another person to put on and taking off regular upper body clothing?: None Help from another person to put on and taking off regular lower body clothing?: A Little 6 Click Score: 20   End of Session Equipment Utilized During Treatment: Oxygen (7L)  Activity Tolerance: Patient limited by fatigue Patient left: in chair;with call bell/phone within reach;with chair alarm set  OT Visit Diagnosis: Unsteadiness on feet (R26.81);Other abnormalities of gait and mobility (R26.89);Muscle weakness (generalized) (M62.81);Other (comment) (decreased activity tolerance)                Time: 1610-9604 OT Time Calculation (min): 18 min Charges:  OT General Charges $OT Visit: 1 Visit OT Evaluation $OT Eval Moderate Complexity: 1 Mod  [name], OTR/L Acute Rehabilitation Services Pager: [phone] Office: [phone]  [name] 09/03/2020, 12:04 PM

## 2020-09-03 NOTE — TOC Initial Note (Addendum)
Transition of Care [facility]) - Initial/Assessment Note    Patient Details  Name: [name] MRN: [id number] Date of Birth: [birth year]  Transition of Care [facility]) CM/SW Contact:    Carles Collet, RN Phone Number: 09/03/2020, 11:37 AM  Clinical Narrative:          Damaris Schooner w patient at bedside. She states she is from home, her and daughter live together. We spoke of her plans for DC, she would like to return to home. She is agreeable to [facility] services, discussed providers, she would like [place], referral accepted. We discussed DME, is very motivated to ambualte w/o assist. She was not interested in [facility], we discussed RW and rollator and she wants to wait to closer to DC to decide. She has oxygen at 4L baseline through [employer], currently weaning at 8L. She states that her daughters will be able to transport her home, they know to bring oxygen for transport.  Will need ambulatory oxygen saturation note to document need for greater than 6L to get oxygen concentrator with 10L capacity at home. Order placed. Nurse notified. Notified [employer] liaison, Thedore Mins to follow for new home concentrator   Will need order for [facility] services. Will possibly need ambulatory DME.           Expected Discharge Plan: [place] Barriers to Discharge: Continued Medical Work up   Patient Goals and CMS Choice Patient states their goals for this hospitalization and ongoing recovery are:: to go home CMS Medicare.gov Compare Post Acute Care list provided to:: Patient Choice offered to / list presented to : Patient  Expected Discharge Plan and Services Expected Discharge Plan: [place]   Discharge Planning Services: CM Consult Post Acute Care Choice: [place] arrangements for the past 2 months: [place]: PT,OT [place] Agency: [place] Date [facility] Agency Contacted: 09/03/20 Time HH Agency Contacted: 1137 Representative  spoke with at [place]: Tommi Rumps  Prior Living Arrangements/Services Living arrangements for the past 2 months: [place] Lives with:: Adult Children   Do you feel safe going back to the place where you live?: Yes               Activities of Daily Living Home Assistive Devices/Equipment: Grab bars in shower,Eyeglasses,Dentures (specify type) ADL Screening (condition at time of admission) Patient's cognitive ability adequate to safely complete daily activities?: Yes Is the patient deaf or have difficulty hearing?: Yes Does the patient have difficulty seeing, even when wearing glasses/contacts?: No Does the patient have difficulty concentrating, remembering, or making decisions?: No Patient able to express need for assistance with ADLs?: No Does the patient have difficulty dressing or bathing?: No Independently performs ADLs?: Yes (appropriate for developmental age) Does the patient have difficulty walking or climbing stairs?: No Weakness of Legs: None Weakness of Arms/Hands: None  Permission Sought/Granted                  Emotional Assessment              Admission diagnosis:  SOB (shortness of breath) [R06.02] COPD with acute exacerbation ([place]) [J44.1] Acute on chronic respiratory failure with hypoxia ([place]) [J96.21] Patient Active Problem List   Diagnosis Date Noted  . COPD with acute exacerbation ([place]) 08/27/2020  . COVID-19 05/01/2020  . Pulmonary  hypertension ([place]) 01/15/2020  . Hypoxemic respiratory failure, chronic ([place]) 01/15/2020  . Lung nodule, multiple 01/15/2020  . 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 SOB (shortness of breath) 12/26/2019  . Acute on chronic respiratory failure with hypoxia ([place]) 12/25/2019  . Acute respiratory failure with hypoxemia ([place]) 12/25/2019  . Essential hypertension 06/06/2017  . Hyperlipidemia 06/06/2017  . Leg cramps 06/06/2017  . Vertigo 06/06/2017  . Macular degeneration 06/06/2017  .  Hypothyroidism 06/06/2017  . Carotid stenosis 01/22/2014  . Aftercare following surgery of the circulatory system, [place] 01/02/2013  . Occlusion and stenosis of carotid artery without mention of cerebral infarction 04/27/2011   PCP:  [name], MD Pharmacy:   [id number] [place], [place] [id number]: [phone]: [phone] [address]ina Modena[address] WFredderick SeveranceA[place]Phone: [phone]: [phone]    Social Determinants of Health (SDOH) Interventions    Readmission Risk Interventions No flowsheet data found.

## 2020-09-03 NOTE — Progress Notes (Signed)
Progress Note    [name]  NWG:[id number] DOB: [birth year]  DOA: 08/27/2020 PCP: [name], MD    Brief Narrative:     Medical records reviewed and are as summarized below:  [name] is an 80 y.o. female with medical history significant of COPD, HTN, Hypothyroidism. She is followed by [name] for pulmonary with last OV 08/15/20. She has been having increasing SOB with increasing need for Oxygen now up to 5 L [place]. Due to increasing symptoms she contacted her doctor who called in Augmentin and started her on oral prednisone. She continued to be short of breath with progressive tachypnea and sats of 75% on 4L. EMS activated. She was put to 15L NRB and transported to [facility] for evaluation.  On echo was found to have worsening pulmonary HTN,  Card/pulm consult appreciated.  S/p cath on 5/23.  Opsumit started.  Wean O2 as able.   Assessment/Plan:   Principal Problem:   Acute on chronic respiratory failure with hypoxia (HCC) Active Problems:   Essential hypertension   Hypothyroidism   Pulmonary hypertension (HCC)   COPD with acute exacerbation (HCC)  Acute on chronic hypoxic respiratory failure Worsening pulmonary HTN  -Follows with [name] for pulmonary.  -CXR clear, CTA chest w/o infiltrates -  procalcitonin negative so abx d/c'ed Plan: --cont tadalafil --cont Opsumit (new) --cont home nebs --Continue supplemental O2 to keep sats between 88-92%, wean as tolerated  COPD with acute exacerbation Chronic hypoxic respiratory failure on home O2 --s/p steroid burst --cont nebs   HTN  --cont amlodipine and Toprol  Hypothyroidism --cont home synthroid  GERD:  --cont PPI   Family Communication/Anticipated D/C date and plan/Code Status   DVT prophylaxis: Lovenox  Code Status: Full Code.  Disposition Plan: home with [facility]  Dispo: The patient is from: Home              Anticipated d/c is to: Home              Patient currently is not medically  stable to d/c.  Still on 7L O2    Difficult to place patient No         Medical Consultants:    PCCM  cards    Subjective:   Pt reported feeling great.  No dyspnea.  Eating well, normal urination and BM.   Objective:    Vitals:   09/03/20 0937 09/03/20 0959 09/03/20 1035 09/03/20 1230  BP:    118/64  Pulse: 80 83  93  Resp: (!) 30 (!) 22  (!) 9  Temp:    98.2 F (36.8 C)  TempSrc:    Oral  SpO2: 95% 90% 95% 90%  Weight:      Height:        Intake/Output Summary (Last 24 hours) at 09/03/2020 1535 Last data filed at 09/03/2020 1231 Gross per 24 hour  Intake 360 ml  Output 1000 ml  Net -640 ml   Filed Weights   08/27/20 1433 08/28/20 1317 09/01/20 0505  Weight: 58.8 kg 58.8 kg 59.6 kg    Exam: Constitutional: NAD, AAOx3 HEENT: conjunctivae and lids normal, EOMI CV: No cyanosis.   RESP: normal respiratory effort, on 7L sating 88-90% Extremities: No effusions, edema in BLE SKIN: warm, dry Neuro: II - XII grossly intact.   Psych: Normal mood and affect.  Appropriate judgement and reason    Data Reviewed:   I have personally reviewed following labs and imaging studies:  Labs:  Labs show the following:   Basic Metabolic Panel: Recent Labs  Lab 08/29/20 0248 08/30/20 0936 09/01/20 0139 09/01/20 0831  NA 138 138 136 143  142  K 4.5 3.8 4.4 3.7  3.7  CL 109 109 109  --   CO2 21* 20* 20*  --   GLUCOSE 132* 118* 93  --   BUN 24* 31* 28*  --   CREATININE 1.02* 1.02* 0.80  --   CALCIUM 9.5 9.3 9.2  --    GFR Estimated Creatinine Clearance: 43.6 mL/min (by C-G formula based on SCr of 0.8 mg/dL). Liver Function Tests: Recent Labs  Lab 09/01/20 0139  AST 26  ALT 24  ALKPHOS 61  BILITOT 0.9  PROT 7.1  ALBUMIN 3.3*   No results for input(s): LIPASE, AMYLASE in the last 168 hours. No results for input(s): AMMONIA in the last 168 hours. Coagulation profile No results for input(s): INR, PROTIME in the last 168 hours.  CBC: Recent Labs   Lab 08/29/20 0248 09/01/20 0139 09/01/20 0831  WBC 15.3* 14.2*  --   HGB 11.7* 13.6 14.3  14.3  HCT 36.8 41.4 42.0  42.0  MCV 96.3 93.7  --   PLT 257 269  --    Cardiac Enzymes: No results for input(s): CKTOTAL, CKMB, CKMBINDEX, TROPONINI in the last 168 hours. BNP (last 3 results) No results for input(s): PROBNP in the last 8760 hours. CBG: No results for input(s): GLUCAP in the last 168 hours. D-Dimer: No results for input(s): DDIMER in the last 72 hours. Hgb A1c: No results for input(s): HGBA1C in the last 72 hours. Lipid Profile: No results for input(s): CHOL, HDL, LDLCALC, TRIG, CHOLHDL, LDLDIRECT in the last 72 hours. Thyroid function studies: No results for input(s): TSH, T4TOTAL, T3FREE, THYROIDAB in the last 72 hours.  Invalid input(s): FREET3 Anemia work up: No results for input(s): VITAMINB12, FOLATE, FERRITIN, TIBC, IRON, RETICCTPCT in the last 72 hours. Sepsis Labs: Recent Labs  Lab 08/28/20 1406 08/29/20 0248 09/01/20 0139  PROCALCITON <0.10  --   --   WBC  --  15.3* 14.2*    Microbiology Recent Results (from the past 240 hour(s))  Resp Panel by RT-PCR (Flu A&B, Covid) Nasopharyngeal Swab     Status: None   Collection Time: 08/27/20  3:03 PM   Specimen: Nasopharyngeal Swab; Nasopharyngeal(NP) swabs in vial transport medium  Result Value Ref Range Status   SARS Coronavirus 2 by RT PCR NEGATIVE NEGATIVE Final    Comment: (NOTE) SARS-CoV-2 target nucleic acids are NOT DETECTED.  The SARS-CoV-2 RNA is generally detectable in upper respiratory specimens during the acute phase of infection. The lowest concentration of SARS-CoV-2 viral copies this assay can detect is 138 copies/mL. A negative result does not preclude SARS-Cov-2 infection and should not be used as the sole basis for treatment or other patient management decisions. A negative result may occur with  improper specimen collection/handling, submission of specimen other than nasopharyngeal  swab, presence of viral mutation(s) within the areas targeted by this assay, and inadequate number of viral copies(<138 copies/mL). A negative result must be combined with clinical observations, patient history, and epidemiological information. The expected result is Negative.  Fact Sheet for Patients:  EntrepreneurPulse.com.au  Fact Sheet for Healthcare Providers:  IncredibleEmployment.be  This test is no t yet approved or cleared by the [place] FDA and  has been authorized for detection and/or diagnosis of SARS-CoV-2 by FDA under an Emergency Use Authorization (EUA). This EUA will remain  in effect (meaning this test can be used) for the duration of the COVID-19 declaration under Section 564(b)(1) of the Act, 21 U.S.C.section 360bbb-3(b)(1), unless the authorization is terminated  or revoked sooner.       Influenza A by PCR NEGATIVE NEGATIVE Final   Influenza B by PCR NEGATIVE NEGATIVE Final    Comment: (NOTE) The Xpert Xpress SARS-CoV-2/FLU/RSV plus assay is intended as an aid in the diagnosis of influenza from Nasopharyngeal swab specimens and should not be used as a sole basis for treatment. Nasal washings and aspirates are unacceptable for Xpert Xpress SARS-CoV-2/FLU/RSV testing.  Fact Sheet for Patients: EntrepreneurPulse.com.au  Fact Sheet for Healthcare Providers: IncredibleEmployment.be  This test is not yet approved or cleared by the [place] FDA and has been authorized for detection and/or diagnosis of SARS-CoV-2 by FDA under an Emergency Use Authorization (EUA). This EUA will remain in effect (meaning this test can be used) for the duration of the COVID-19 declaration under Section 564(b)(1) of the Act, 21 U.S.C. section 360bbb-3(b)(1), unless the authorization is terminated or revoked.  Performed at [facility], [place] [address] [place], [place] [address]   MRSA PCR  Screening     Status: Abnormal   Collection Time: 09/01/20  5:49 AM   Specimen: Nasal Mucosa; Nasopharyngeal  Result Value Ref Range Status   MRSA by PCR POSITIVE (A) NEGATIVE Final    Comment:        The GeneXpert MRSA Assay (FDA approved for NASAL specimens only), is one component of a comprehensive MRSA colonization surveillance program. It is not intended to diagnose MRSA infection nor to guide or monitor treatment for MRSA infections. RESULT CALLED TO, READ BACK BY AND VERIFIED WITH: RN K.HELL AT 0800 ON 09/01/2020 BY T.SAAD. Performed at [facility], [place] [address], [place], [place] [address]     Procedures and diagnostic studies:  No results found.  Medications:   . amLODipine  5 mg Oral Daily  . arformoterol  15 mcg Nebulization BID  . aspirin  81 mg Oral Daily  . budesonide  0.5 mg Nebulization BH-qamhs  . clopidogrel  75 mg Oral Daily  . diclofenac Sodium  2 g Topical QID  . enoxaparin (LOVENOX) injection  40 mg Subcutaneous Q24H  . gabapentin  100 mg Oral QHS  . levothyroxine  75 mcg Oral Q0600  . loratadine  10 mg Oral Daily  . macitentan  10 mg Oral Daily  . mouth rinse  15 mL Mouth Rinse BID  . metoprolol succinate  25 mg Oral Daily  . mupirocin ointment  1 application Nasal BID  . pantoprazole  40 mg Oral Daily  . simvastatin  20 mg Oral q1800  . sodium chloride flush  3 mL Intravenous Q12H  . tadalafil  20 mg Oral Daily   Continuous Infusions:    LOS: 7 days   Enzo Bi  [facility]   How to contact the [facility] Attending or Consulting provider [place] or covering provider during after hours [place], for this patient?  1. Check the care team in [facility] and look for a) attending/consulting TR[facility] provider listed and b) the [facility] team listed 2. Log into [email or web address] and use [place]'s universal password to access. If you do not have the password, please contact the hospital operator. 3. Locate the [facility] provider you are looking for under [facility] and page to a number that you can be directly reached. 4. If you still  have difficulty reaching the provider, please page the [facility] (Director on Call) for the Hospitalists listed on amion for assistance.  09/03/2020, 3:35 PM

## 2020-09-03 NOTE — Progress Notes (Signed)
NAME:  [name], MRN:  [id number], DOB:  16-Oct-1940, LOS: 7 ADMISSION DATE:  08/27/2020, CONSULTATION DATE:  5/21 REFERRING MD:  Dr. Lucianne Lei, CHIEF COMPLAINT:  Dyspnea   History of Present Illness:  80 y/o female admitted with dyspnea in setting of acute on chronic pulmonary hypertension.  Has had an extensive work up as outpatient.  At baseline has mild emphysema and OSA, but no ILD.    Pertinent  Medical History  COPD GERD Pulmonary hypertension  Significant Hospital Events: Including procedures, antibiotic start and stop dates in addition to other pertinent events   . 5/20 admitted, PCCM consult 5/21 . 5/23 [employer] 5/23 (see below)  Interim History / Subjective:  Pt reports feeling well, good appetite RN reports O2 decreased to 7L  Objective   Blood pressure 129/65, pulse 87, temperature 97.8 F (36.6 C), temperature source Axillary, resp. rate (!) 21, height _0  (1.575 m), weight 59.6 kg, SpO2 91 %.        Intake/Output Summary (Last 24 hours) at 09/03/2020 0919 Last data filed at 09/03/2020 0736 Gross per 24 hour  Intake 360 ml  Output 1000 ml  Net -640 ml   Filed Weights   08/27/20 1433 08/28/20 1317 09/01/20 0505  Weight: 58.8 kg 58.8 kg 59.6 kg    General: elderly adult female lying in bed in NAD  HEENT: MM pink/moist, [place] O2, anicteric  Neuro: AAOx4, speech clear, MAE CV: s1s2 RRR, no m/r/g PULM: non-labored at rest, clear anterior, R>L basilar crackles  GI: soft, bsx4 active  Extremities: warm/dry, no edema  Skin: no rashes or lesions   Labs/imaging that I havepersonally reviewed  (right click and "Reselect all SmartList Selections" daily)  CTA 5/18-no pulmonary embolism mild emphysema, distal atelectasis and scattered mediastinal and hilar lymphadenopathy that is stable.  Echocardiogram 08/29/2020- LVEF 60 to 65%, mild concentric LVH, grade 1 diastolic dysfunction.  24-year-old pulmonary hypertension, estimated RVSP 73.3  High-res CT 08/30/2020- RUL  nodules, pleural based, mild emphysema and airway thickening, some reticular changes in upper lobes and some lower lobe non-specific interstitial changes without honeycombing or traction bronchiectasis  RHC 09/01/2020> RA mean 1 RV 67/1 PA 72/13, mean 36 PCWP mean 1  Oxygen saturations: PA 74% AO 99%  Cardiac Output (Fick) 4.61  Cardiac Index (Fick) 2.88 PVR 7.6 WU  Outpatient work-up Sleep study 03/19/2020- AHI 5.5 with desats  VQ scan 12/27/2019-low probability for PE  Lower extremity duplex 12/27/19 negative for DVT  Autoimmune serologies 12/27/2019-negative  Unable to complete PFTs  Resolved Hospital Problem list     Assessment & Plan:   Acute on chronic hypoxemic respiratory failure due to complex picture of mild emphysema, non-specific interstitial lung disease and underlying pulmonary artery hypertension > with symptomatic improvement assume the prednisone and antibiotics helped, so perhaps the acute decompensation was related to a COPD exacerbation? In further review of images it has been noted that she has had lymphadenopathy and non-specific fibrotic changes which have not progressed.  Could she have sarcoid? It would be very unusual for it to present at this age but we have no other unifying diagnosis.  Would prefer to perform a bronchoscopy but her oxygen requirements are too high for that right now.   AE COPD Completed cefepime  -prednisone as below  -continue brovana + pulmicort   Unexplained diffuse parenchymal lung disease, see discussion above -prednisone 20 mg QD until seen in office, has appt with 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  Would be nice to  have spirometry data at a minimum. Perhaps she can repeat PFT as outpatient -will need outpatient PFT -macitentan per Cardiology, pre-approval pending  -will need to monitor LFT's  -follow daily weights, fluid restriction   Chronic respiraotry failure with hypoxemia: resting O2 requirement improved  (was on 7 liters when I came to her room today) -wean O2 for sats >88% -will ask Care Mgmt to assist in getting a concentrator that will allow for increased O2 support at home -PT consult > may need short term rehab  -mobilize   Best practice (right click and "Reselect all SmartList Selections" daily)   Per primary   Critical care time: n/a     Noe Gens, MSN, APRN, NP-C, AGACNP-BC [facility] 09/03/2020, 9:19 AM   Please see [email or web address] for pager details.   From 7A-7P if no response, please call [phone] After hours, please call ELink [phone]

## 2020-09-03 NOTE — Telephone Encounter (Signed)
Received a call from [facility] requesting more information for Opsumit PA. Called, [phone] and provided more information.   Call reference number: [id number]

## 2020-09-03 NOTE — Telephone Encounter (Signed)
Patient Advocate Encounter   Received notification from [facility] that prior authorization for Opsumit is required.   PA submitted on CoverMyMeds [facility] Status is pending   Will continue to follow.

## 2020-09-03 NOTE — Telephone Encounter (Signed)
Copied from [place] [id number]. Topic: General - Inquiry >> Sep 03, 2020  1:49 PM Lennox Solders wrote: Reason for CRM: Pt daughter [name] dunlap is calling and would like social worker to return her call concerning getting her mother some help. They are going through CAP program and would like home health aid .Pt states her mother would like her to be her aid. Pt is in the  [place]

## 2020-09-04 ENCOUNTER — Telehealth: Payer: Self-pay | Admitting: Internal Medicine

## 2020-09-04 DIAGNOSIS — J9621 Acute and chronic respiratory failure with hypoxia: Secondary | ICD-10-CM | POA: Diagnosis not present

## 2020-09-04 DIAGNOSIS — I272 Pulmonary hypertension, unspecified: Secondary | ICD-10-CM | POA: Diagnosis not present

## 2020-09-04 LAB — CBC
HCT: 40.4 % (ref 36.0–46.0)
Hemoglobin: 13.1 g/dL (ref 12.0–15.0)
MCH: 30.5 pg (ref 26.0–34.0)
MCHC: 32.4 g/dL (ref 30.0–36.0)
MCV: 94 fL (ref 80.0–100.0)
Platelets: 258 10*3/uL (ref 150–400)
RBC: 4.3 MIL/uL (ref 3.87–5.11)
RDW: 13.6 % (ref 11.5–15.5)
WBC: 15.3 10*3/uL — ABNORMAL HIGH (ref 4.0–10.5)
nRBC: 0 % (ref 0.0–0.2)

## 2020-09-04 LAB — BASIC METABOLIC PANEL
Anion gap: 8 (ref 5–15)
BUN: 29 mg/dL — ABNORMAL HIGH (ref 8–23)
CO2: 19 mmol/L — ABNORMAL LOW (ref 22–32)
Calcium: 8.8 mg/dL — ABNORMAL LOW (ref 8.9–10.3)
Chloride: 109 mmol/L (ref 98–111)
Creatinine, Ser: 0.94 mg/dL (ref 0.44–1.00)
GFR, Estimated: >60 mL/min (ref >60)
Glucose, Bld: 91 mg/dL (ref 70–99)
Potassium: 4.6 mmol/L (ref 3.5–5.1)
Sodium: 136 mmol/L (ref 135–145)

## 2020-09-04 LAB — ANGIOTENSIN CONVERTING ENZYME: Angiotensin-Converting Enzyme: 20 U/L (ref 14–82)

## 2020-09-04 LAB — MAGNESIUM: Magnesium: 2.1 mg/dL (ref 1.7–2.4)

## 2020-09-04 NOTE — Telephone Encounter (Signed)
Forms has been located and placed in pcp box

## 2020-09-04 NOTE — Telephone Encounter (Signed)
Patient daughter Lorenza Evangelist dropped off paperwork to be filled out by Dr. Wynetta Emery. Please contact Lorenza Evangelist [phone] or Wyman Songster [phone] when ready for pick up. Forms place in PCP box.

## 2020-09-04 NOTE — Progress Notes (Signed)
RT note- Prior to treatment patient moving from bedside commode to bed, sp02 74%. Recovered well with increased o2 and nebulizers given at this time. Sp02 now 100% with treatment and will remain on 6l/min [place].

## 2020-09-04 NOTE — Progress Notes (Signed)
Patient ID: [name], female   DOB: 03-Jan-1941, 80 y.o.   MRN: [id number]     Advanced Heart Failure Rounding Note  PCP-Cardiologist: None   Subjective:    Started on macitentan after cath. Started on 20 mg tadalafil yesterday.  Still on oxygen 7L by [place].  Feels ok. Denies SOB. Remains on prednisone.   RHC 5/23  Hemodynamics (mmHg) RA mean 1 RV 67/1 PA 72/13, mean 36 PCWP mean 1 Oxygen saturations: PA 74% AO 99% Cardiac Output (Fick) 4.61  Cardiac Index (Fick) 2.88 PVR 7.6 WU  Echo: EF 60-65%, mild LVH, moderate RV enlargement, mildly decreased RV systolic function, PASP 73 mmHg.   High resolution CT chest: No evidence for ILD, mild emphysema, mediastinal LAN less prominent and likely reactive.   Objective:   Weight Range: 59.6 kg Body mass index is 24.03 kg/m.   Vital Signs:   Temp:  [97.5 F (36.4 C)-98.6 F (37 C)] 97.5 F (36.4 C) (05/26 1139) Pulse Rate:  [80-104] 104 (05/26 1139) Resp:  [18-25] 20 (05/26 1139) BP: (101-121)/(53-62) 121/61 (05/26 1139) SpO2:  [92 %-100 %] 93 % (05/26 1139) Last BM Date: 09/04/20  Weight change: Filed Weights   08/27/20 1433 08/28/20 1317 09/01/20 0505  Weight: 58.8 kg 58.8 kg 59.6 kg    Intake/Output:   Intake/Output Summary (Last 24 hours) at 09/04/2020 1240 Last data filed at 09/04/2020 0840 Gross per 24 hour  Intake 120 ml  Output 1100 ml  Net -980 ml      Physical Exam    General: NAD Neck: No JVD, no thyromegaly or thyroid nodule.  Lungs: Clear to auscultation bilaterally with normal respiratory effort. CV: Nondisplaced PMI.  Heart regular S1/S2, no S3/S4, no murmur.  No peripheral edema.   Abdomen: Soft, nontender, no hepatosplenomegaly, no distention.  Skin: Intact without lesions or rashes.  Neurologic: Alert and oriented x 3.  Psych: Normal affect. Extremities: No clubbing or cyanosis.  HEENT: Normal.    Telemetry   SR 80-90s personally reviewed.    Labs    CBC Recent Labs     09/04/20 0404  WBC 15.3*  HGB 13.1  HCT 40.4  MCV 94.0  PLT 578   Basic Metabolic Panel Recent Labs    09/04/20 0137  NA 136  K 4.6  CL 109  CO2 19*  GLUCOSE 91  BUN 29*  CREATININE 0.94  CALCIUM 8.8*  MG 2.1   Liver Function Tests No results for input(s): AST, ALT, ALKPHOS, BILITOT, PROT, ALBUMIN in the last 72 hours. No results for input(s): LIPASE, AMYLASE in the last 72 hours. Cardiac Enzymes No results for input(s): CKTOTAL, CKMB, CKMBINDEX, TROPONINI in the last 72 hours.  BNP: BNP (last 3 results) Recent Labs    05/01/20 1232 08/28/20 1406 08/30/20 0936  BNP 80.9 218.6* 107.9*    ProBNP (last 3 results) No results for input(s): PROBNP in the last 8760 hours.   D-Dimer No results for input(s): DDIMER in the last 72 hours. Hemoglobin A1C No results for input(s): HGBA1C in the last 72 hours. Fasting Lipid Panel No results for input(s): CHOL, HDL, LDLCALC, TRIG, CHOLHDL, LDLDIRECT in the last 72 hours. Thyroid Function Tests No results for input(s): TSH, T4TOTAL, T3FREE, THYROIDAB in the last 72 hours.  Invalid input(s): FREET3  Other results:   Imaging    No results found.   Medications:     Scheduled Medications: . amLODipine  5 mg Oral Daily  . arformoterol  15 mcg Nebulization  BID  . aspirin  81 mg Oral Daily  . budesonide  0.5 mg Nebulization BH-qamhs  . clopidogrel  75 mg Oral Daily  . diclofenac Sodium  2 g Topical QID  . enoxaparin (LOVENOX) injection  40 mg Subcutaneous Q24H  . gabapentin  100 mg Oral QHS  . levothyroxine  75 mcg Oral Q0600  . loratadine  10 mg Oral Daily  . macitentan  10 mg Oral Daily  . mouth rinse  15 mL Mouth Rinse BID  . metoprolol succinate  25 mg Oral Daily  . mupirocin ointment  1 application Nasal BID  . pantoprazole  40 mg Oral Daily  . simvastatin  20 mg Oral q1800  . sodium chloride flush  3 mL Intravenous Q12H  . tadalafil  20 mg Oral Daily    Infusions:   PRN  Medications: acetaminophen, albuterol, diazepam, docusate sodium, fluticasone, guaiFENesin-dextromethorphan, ondansetron (ZOFRAN) IV   Assessment/Plan   1. Pulmonary hypertension:  Echo (5/22) with EF 60-65%, mild LVH, moderate RV enlargement, mildly decreased RV systolic function, PASP 73 mmHg. High resolution CT chest with no evidence for ILD, mild emphysema, mediastinal LAN less prominent than on prior CT and likely reactive. She had a V/Q scan in 9/21 that was not suggestive of chronic PEs and lower extremity venous dopplers with no DVT.  Unable to obtain PFTs.  She has mild OSA.  Autoimmune serologies negative.  RHC with PAH, PVR 7.6 WU.  Cause of PAH is uncertain, concerned for component of group 1 PH as mild OSA and mild COPD do not appear to explain.  There has been some concern that she could have sarcoidosis, but CT chest is not clearly suggestive of this.  She is on home 5 liters oxygen, currently on 7 L here.  I do think that it would be reasonable at this point to treat for group 1 PH with trial of selective pulmonary vasodilators.  - Started opsumit 09/01/20 . Patient assistance submitted. Specialty pharmacy will deliver once apprvoed.  - Started tadalafil 09/03/20, will make sure with pharmacy that she can get this for home.  - Treatment of OSA and COPD per pulmonary.  - Further workup for sarcoidosis => per pulmonary. She will remain on prednisone for now.  - No further Lasix, low filling pressures.  2. COPD: Mild, currently treating for component of exacerbation with prednisone taper and bronchodilators.  3. OSA: Mild.  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  - On ASA/Plavix and Zocor.   Length of Stay: 8  Loralie Champagne, MD  09/04/2020, 12:40 PM  Advanced Heart Failure Team Pager [phone] (M-F; 7a - 5p)  Please contact [facility] for night-coverage after hours  (5p -7a ) and weekends on [email or web address]

## 2020-09-04 NOTE — Progress Notes (Signed)
Physical Therapy Treatment Patient Details Name: [name] MRN: [id number] DOB: 08-04-1940 Today's Date: 09/04/2020    History of Present Illness 80 y.o. female brought to ED for shortness of breath 08/27/20 via EMS Sats of 75% and RR of 50 on RA that improved to 95% and rr of 30 on 10L. Admitted for treatment of acute exacerbation of COPD. Pt is s/p heart cath on 5/23.  PMH: COPD, HTN, Hypothyroidism.    PT Comments    Patient received in bed, pleasant and very motivated to try walking more. Performed modified sat testing per MD request- see accompanying note for details. Overall, still requires at least 8LPM to remain at >80% SPO2 and mobility is significantly limited by cardiopulmonary status. Able to progress in room distances, but likely would benefit from [facility] for community distance. HR up to as much as 127BPM with mobility today. Left up in recliner with all needs met. MD entering room at EOS and aware of results of sat testing today. Will continue to follow.     Follow Up Recommendations  Home health PT;Supervision/Assistance - 24 hour     Equipment Recommendations  3in1 (PT);Wheelchair (measurements PT);Rolling walker with 5" wheels;Wheelchair cushion (measurements PT)    Recommendations for Other Services       Precautions / Restrictions Precautions Precautions: Fall;Other (comment) Precaution Comments: watch sats, O2 >85% per order set with activity, 88% at rest Restrictions Weight Bearing Restrictions: No    Mobility  Bed Mobility Overal bed mobility: Modified Independent Bed Mobility: Supine to Sit     Supine to sit: Modified independent (Device/Increase time)     General bed mobility comments: assist with line management, otherwise mod(I)    Transfers Overall transfer level: Needs assistance Equipment used: Rolling walker (2 wheeled) Transfers: Sit to/from Stand Sit to Stand: Min guard         General transfer comment: cues for safety/hand placement  with transfers, much more steady wtih BUE support  Ambulation/Gait Ambulation/Gait assistance: Min guard Gait Distance (Feet): 32 Feet (25fx2) Assistive device: Rolling walker (2 wheeled) Gait Pattern/deviations: Step-through pattern;Decreased step length - right;Decreased step length - left;Trunk flexed Gait velocity: Decreased   General Gait Details: tried RW today for improved steadiness and to try to give respiratory muscles an "anchor"; see modified sat note for details but still requires at least 8LPM to remain >80% with activity   Stairs             Wheelchair Mobility    Modified Rankin (Stroke Patients Only)       Balance Overall balance assessment: Needs assistance Sitting-balance support: No upper extremity supported Sitting balance-Leahy Scale: Good     Standing balance support: Bilateral upper extremity supported;During functional activity Standing balance-Leahy Scale: Fair Standing balance comment: benefits from BUE support                            Cognition Arousal/Alertness: Awake/alert Behavior During Therapy: WFL for tasks assessed/performed Overall Cognitive Status: Within Functional Limits for tasks assessed                                 General Comments: very pleasant and motivated      Exercises      General Comments        Pertinent Vitals/Pain Pain Assessment: No/denies pain    Home Living  Prior Function            PT Goals (current goals can now be found in the care plan section) Acute Rehab PT Goals Patient Stated Goal: to be able to walk more PT Goal Formulation: With patient Time For Goal Achievement: 09/13/20 Potential to Achieve Goals: Good Progress towards PT goals: Progressing toward goals    Frequency    Min 3X/week      PT Plan Equipment recommendations need to be updated    Co-evaluation              AM-PAC PT "6 Clicks" Mobility    Outcome Measure  Help needed turning from your back to your side while in a flat bed without using bedrails?: None Help needed moving from lying on your back to sitting on the side of a flat bed without using bedrails?: None Help needed moving to and from a bed to a chair (including a wheelchair)?: A Little Help needed standing up from a chair using your arms (e.g., wheelchair or bedside chair)?: A Little Help needed to walk in hospital room?: A Little Help needed climbing 3-5 steps with a railing? : A Lot 6 Click Score: 19    End of Session Equipment Utilized During Treatment: Oxygen Activity Tolerance: Patient limited by fatigue Patient left: in chair;with call bell/phone within reach Nurse Communication: Mobility status PT Visit Diagnosis: Other abnormalities of gait and mobility (R26.89);Difficulty in walking, not elsewhere classified (R26.2);Muscle weakness (generalized) (M62.81)     Time: 6438-3818 PT Time Calculation (min) (ACUTE ONLY): 24 min  Charges:  $Gait Training: 8-22 mins $Therapeutic Activity: 8-22 mins                     Windell Norfolk, DPT, PN1   Supplemental Physical Therapist [place]    Pager [phone] Acute Rehab Office [phone]

## 2020-09-04 NOTE — Progress Notes (Signed)
Progress Note    [name]  OEV:[id number] DOB: [birth year]  DOA: 08/27/2020 PCP: [name], MD    Brief Narrative:     Medical records reviewed and are as summarized below:  [name] is an 80 y.o. female with medical history significant of COPD, HTN, Hypothyroidism. She is followed by [name] for pulmonary with last OV 08/15/20. She has been having increasing SOB with increasing need for Oxygen now up to 5 L [place]. Due to increasing symptoms she contacted her doctor who called in Augmentin and started her on oral prednisone. She continued to be short of breath with progressive tachypnea and sats of 75% on 4L. EMS activated. She was put to 15L NRB and transported to [facility] for evaluation.  On echo was found to have worsening pulmonary HTN,  Card/pulm consult appreciated.  S/p cath on 5/23.  Opsumit started.  Wean O2 as able.   Assessment/Plan:   Principal Problem:   Acute on chronic respiratory failure with hypoxia (HCC) Active Problems:   Essential hypertension   Hypothyroidism   Pulmonary hypertension (HCC)   COPD with acute exacerbation (HCC)  Acute on chronic hypoxic respiratory failure Worsening pulmonary HTN  -Follows with [name] for pulmonary.  -CXR clear, CTA chest w/o infiltrates -  procalcitonin negative so abx d/c'ed Plan: --cont tadalafil --cont Opsumit (new) --cont home nebs --Continue supplemental O2 to keep sats between 88-92%, wean as tolerated  COPD with acute exacerbation Chronic hypoxic respiratory failure on home O2 --s/p steroid burst --cont nebs scheduled   HTN  --cont amlodipine and Toprol  Hypothyroidism --cont Synthroid  GERD:  --cont PPI   Family Communication/Anticipated D/C date and plan/Code Status   DVT prophylaxis: Lovenox  Code Status: Full Code.  Disposition Plan: home with [facility]  Dispo: The patient is from: Home              Anticipated d/c is to: Home              Patient currently is not medically  stable to d/c.  Still need up to 10L O2 with exertion.    Difficult to place patient No         Medical Consultants:    PCCM  cards    Subjective:   Pt reported doing great, no pain.  No dyspnea.  O2 requirement down to 6L at rest, but needed up to 10L with walking and exertion.   Objective:    Vitals:   09/04/20 0847 09/04/20 0852 09/04/20 1139 09/04/20 1623  BP:   121/61 109/60  Pulse:   (!) 104 (!) 103  Resp:   20 20  Temp:   (!) 97.5 F (36.4 C) 98.4 F (36.9 C)  TempSrc:   Oral Oral  SpO2: 94% 100% 93% 93%  Weight:      Height:        Intake/Output Summary (Last 24 hours) at 09/04/2020 1753 Last data filed at 09/04/2020 0840 Gross per 24 hour  Intake --  Output 700 ml  Net -700 ml   Filed Weights   08/27/20 1433 08/28/20 1317 09/01/20 0505  Weight: 58.8 kg 58.8 kg 59.6 kg    Exam: Constitutional: NAD, AAOx3 HEENT: conjunctivae and lids normal, EOMI CV: No cyanosis.   RESP: normal respiratory effort, no distress SKIN: warm, dry Neuro: II - XII grossly intact.   Psych: Normal mood and affect.  Appropriate judgement and reason    Data Reviewed:   I  have personally reviewed following labs and imaging studies:  Labs: Labs show the following:   Basic Metabolic Panel: Recent Labs  Lab 08/29/20 0248 08/30/20 0936 09/01/20 0139 09/01/20 0831 09/04/20 0137  NA 138 138 136 143  142 136  K 4.5 3.8 4.4 3.7  3.7 4.6  CL 109 109 109  --  109  CO2 21* 20* 20*  --  19*  GLUCOSE 132* 118* 93  --  91  BUN 24* 31* 28*  --  29*  CREATININE 1.02* 1.02* 0.80  --  0.94  CALCIUM 9.5 9.3 9.2  --  8.8*  MG  --   --   --   --  2.1   GFR Estimated Creatinine Clearance: 37.1 mL/min (by C-G formula based on SCr of 0.94 mg/dL). Liver Function Tests: Recent Labs  Lab 09/01/20 0139  AST 26  ALT 24  ALKPHOS 61  BILITOT 0.9  PROT 7.1  ALBUMIN 3.3*   No results for input(s): LIPASE, AMYLASE in the last 168 hours. No results for input(s): AMMONIA  in the last 168 hours. Coagulation profile No results for input(s): INR, PROTIME in the last 168 hours.  CBC: Recent Labs  Lab 08/29/20 0248 09/01/20 0139 09/01/20 0831 09/04/20 0404  WBC 15.3* 14.2*  --  15.3*  HGB 11.7* 13.6 14.3  14.3 13.1  HCT 36.8 41.4 42.0  42.0 40.4  MCV 96.3 93.7  --  94.0  PLT 257 269  --  258   Cardiac Enzymes: No results for input(s): CKTOTAL, CKMB, CKMBINDEX, TROPONINI in the last 168 hours. BNP (last 3 results) No results for input(s): PROBNP in the last 8760 hours. CBG: No results for input(s): GLUCAP in the last 168 hours. D-Dimer: No results for input(s): DDIMER in the last 72 hours. Hgb A1c: No results for input(s): HGBA1C in the last 72 hours. Lipid Profile: No results for input(s): CHOL, HDL, LDLCALC, TRIG, CHOLHDL, LDLDIRECT in the last 72 hours. Thyroid function studies: No results for input(s): TSH, T4TOTAL, T3FREE, THYROIDAB in the last 72 hours.  Invalid input(s): FREET3 Anemia work up: No results for input(s): VITAMINB12, FOLATE, FERRITIN, TIBC, IRON, RETICCTPCT in the last 72 hours. Sepsis Labs: Recent Labs  Lab 08/29/20 0248 09/01/20 0139 09/04/20 0404  WBC 15.3* 14.2* 15.3*    Microbiology Recent Results (from the past 240 hour(s))  Resp Panel by RT-PCR (Flu A&B, Covid) Nasopharyngeal Swab     Status: None   Collection Time: 08/27/20  3:03 PM   Specimen: Nasopharyngeal Swab; Nasopharyngeal(NP) swabs in vial transport medium  Result Value Ref Range Status   SARS Coronavirus 2 by RT PCR NEGATIVE NEGATIVE Final    Comment: (NOTE) SARS-CoV-2 target nucleic acids are NOT DETECTED.  The SARS-CoV-2 RNA is generally detectable in upper respiratory specimens during the acute phase of infection. The lowest concentration of SARS-CoV-2 viral copies this assay can detect is 138 copies/mL. A negative result does not preclude SARS-Cov-2 infection and should not be used as the sole basis for treatment or other patient  management decisions. A negative result may occur with  improper specimen collection/handling, submission of specimen other than nasopharyngeal swab, presence of viral mutation(s) within the areas targeted by this assay, and inadequate number of viral copies(<138 copies/mL). A negative result must be combined with clinical observations, patient history, and epidemiological information. The expected result is Negative.  Fact Sheet for Patients:  EntrepreneurPulse.com.au  Fact Sheet for Healthcare Providers:  IncredibleEmployment.be  This test is no t yet approved  or cleared by the [place] and  has been authorized for detection and/or diagnosis of SARS-CoV-2 by FDA under an Emergency Use Authorization (EUA). This EUA will remain  in effect (meaning this test can be used) for the duration of the COVID-19 declaration under Section 564(b)(1) of the Act, 21 U.S.C.section 360bbb-3(b)(1), unless the authorization is terminated  or revoked sooner.       Influenza A by PCR NEGATIVE NEGATIVE Final   Influenza B by PCR NEGATIVE NEGATIVE Final    Comment: (NOTE) The Xpert Xpress SARS-CoV-2/FLU/RSV plus assay is intended as an aid in the diagnosis of influenza from Nasopharyngeal swab specimens and should not be used as a sole basis for treatment. Nasal washings and aspirates are unacceptable for Xpert Xpress SARS-CoV-2/FLU/RSV testing.  Fact Sheet for Patients: EntrepreneurPulse.com.au  Fact Sheet for Healthcare Providers: IncredibleEmployment.be  This test is not yet approved or cleared by the [place] FDA and has been authorized for detection and/or diagnosis of SARS-CoV-2 by FDA under an Emergency Use Authorization (EUA). This EUA will remain in effect (meaning this test can be used) for the duration of the COVID-19 declaration under Section 564(b)(1) of the Act, 21 U.S.C. section 360bbb-3(b)(1),  unless the authorization is terminated or revoked.  Performed at [facility], [place] [address] [place], [place] [address]   MRSA PCR Screening     Status: Abnormal   Collection Time: 09/01/20  5:49 AM   Specimen: Nasal Mucosa; Nasopharyngeal  Result Value Ref Range Status   MRSA by PCR POSITIVE (A) NEGATIVE Final    Comment:        The GeneXpert MRSA Assay (FDA approved for NASAL specimens only), is one component of a comprehensive MRSA colonization surveillance program. It is not intended to diagnose MRSA infection nor to guide or monitor treatment for MRSA infections. RESULT CALLED TO, READ BACK BY AND VERIFIED WITH: RN K.HELL AT 0800 ON 09/01/2020 BY T.SAAD. Performed at [facility], [place] [address], [place],  [address]     Procedures and diagnostic studies:  No results found.  Medications:   . amLODipine  5 mg Oral Daily  . arformoterol  15 mcg Nebulization BID  . aspirin  81 mg Oral Daily  . budesonide  0.5 mg Nebulization BH-qamhs  . clopidogrel  75 mg Oral Daily  . diclofenac Sodium  2 g Topical QID  . enoxaparin (LOVENOX) injection  40 mg Subcutaneous Q24H  . gabapentin  100 mg Oral QHS  . levothyroxine  75 mcg Oral Q0600  . loratadine  10 mg Oral Daily  . macitentan  10 mg Oral Daily  . mouth rinse  15 mL Mouth Rinse BID  . metoprolol succinate  25 mg Oral Daily  . mupirocin ointment  1 application Nasal BID  . pantoprazole  40 mg Oral Daily  . simvastatin  20 mg Oral q1800  . sodium chloride flush  3 mL Intravenous Q12H  . tadalafil  20 mg Oral Daily   Continuous Infusions:    LOS: 8 days   Enzo Bi  [facility]   How to contact the [facility] Attending or Consulting provider [place] or covering provider during after hours [place], for this patient?  1. Check the care team in [facility] and look for a) attending/consulting TRH provider listed and b) the [facility] team listed 2. Log into [email or web address] and use [place]'s universal  password to access. If you do not have the password, please  contact the hospital operator. 3. Locate the [facility] provider you are looking for under [facility] and page to a number that you can be directly reached. 4. If you still have difficulty reaching the provider, please page the [facility] (Director on Call) for the Hospitalists listed on amion for assistance.  09/04/2020, 5:53 PM

## 2020-09-04 NOTE — Progress Notes (Signed)
NAME:  [name], MRN:  [id number], DOB:  [birth year], LOS: 8 ADMISSION DATE:  08/27/2020, CONSULTATION DATE:  5/21 REFERRING MD:  Dr. Lucianne Lei, CHIEF COMPLAINT:  Dyspnea   History of Present Illness:  80 y/o female admitted with dyspnea in setting of acute on chronic pulmonary hypertension.  Has had an extensive work up as outpatient.  At baseline has mild emphysema and OSA, but no ILD.    Pertinent  Medical History  COPD GERD Pulmonary hypertension  Significant Hospital Events: Including procedures, antibiotic start and stop dates in addition to other pertinent events   . 5/20 admitted, PCCM consult 5/21 . 5/23 [place] 5/23 (see below)  Interim History / Subjective:  O2 at 6L sats high 90s. Says feeling better, breathing better.  Objective   Blood pressure 119/62, pulse 98, temperature 98.3 F (36.8 C), temperature source Oral, resp. rate (!) 24, height _0  (1.575 m), weight 59.6 kg, SpO2 100 %.    FiO2 (%):  [48 %] 48 %   Intake/Output Summary (Last 24 hours) at 09/04/2020 0941 Last data filed at 09/04/2020 0840 Gross per 24 hour  Intake 240 ml  Output 1100 ml  Net -860 ml   Filed Weights   08/27/20 1433 08/28/20 1317 09/01/20 0505  Weight: 58.8 kg 58.8 kg 59.6 kg    General: elderly adult female lying in bed in NAD  HEENT: MM pink/moist, [place] O2, anicteric  Neuro: AAOx4, speech clear, MAE CV: s1s2 RRR, no m/r/g PULM: non-labored at rest, clear anterior, R>L basilar crackles  GI: soft, bsx4 active  Extremities: warm/dry, no edema  Skin: no rashes or lesions   Labs/imaging that I havepersonally reviewed  (right click and "Reselect all SmartList Selections" daily)  CTA 5/18-no pulmonary embolism mild emphysema, distal atelectasis and scattered mediastinal and hilar lymphadenopathy that is stable.  Echocardiogram 08/29/2020- LVEF 60 to 65%, mild concentric LVH, grade 1 diastolic dysfunction.  31-year-old pulmonary hypertension, estimated RVSP 73.3  High-res CT  08/30/2020- RUL nodules, pleural based, mild emphysema and airway thickening, some reticular changes in upper lobes and some lower lobe non-specific interstitial changes without honeycombing or traction bronchiectasis  RHC 09/01/2020> RA mean 1 RV 67/1 PA 72/13, mean 36 PCWP mean 1  Oxygen saturations: PA 74% AO 99%  Cardiac Output (Fick) 4.61  Cardiac Index (Fick) 2.88 PVR 7.6 WU  Outpatient work-up Sleep study 03/19/2020- AHI 5.5 with desats  VQ scan 12/27/2019-low probability for PE  Lower extremity duplex 12/27/19 negative for DVT  Autoimmune serologies 12/27/2019-negative  Unable to complete PFTs  Resolved Hospital Problem list     Assessment & Plan:   Acute on chronic hypoxemic respiratory failure due to complex picture of mild emphysema, non-specific interstitial lung disease/scarring and underlying pulmonary hypertension > with symptomatic improvement with prednisone. 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  AE COPD Completed cefepime  -prednisone as below  -continue brovana + pulmicort   Unexplained diffuse parenchymal lung disease, see discussion above -prednisone 20 mg QD until seen in office, has appt with [name] on June 20th at [facility] -will treat as possible sarcoidosis with moderate dose prednisone as above -follow up in pulmonary clinic arranged  -send ACE and HIV for completeness  -consider tissue diagnosis if oxygeneration permits  Pulmonary hypertension: WHO group 1 and 3 (group 5 if sarcoid present), concern that there is disproportionately elevated pulmonary vascular pressure.  Would be nice to have spirometry data at a minimum. Perhaps  she can repeat PFT as outpatient. PVR > 7 with preserved CO and CI on RHC. -will need outpatient PFT -macitentan, tadalfil per Cardiology, pre-approval pending  -will need to monitor LFT's  -follow daily weights, fluid restriction   Chronic respiraotry  failure with hypoxemia: resting O2 requirement improved  (was on 6 liters when I came to her room today) -wean O2 for sats >88% -will ask Care Mgmt to assist in getting a concentrator that will allow for increased O2 support at home - MUST HAVE SATURATIONS CHECKED [place] -PT consult > may need short term rehab  -mobilize   Best practice (right click and "Reselect all SmartList Selections" daily)   Per primary   Critical care time: n/a     Lanier Clam, MD  [facility] 09/04/2020, 9:41 AM   Please see [email or web address] for pager details.

## 2020-09-04 NOTE — Progress Notes (Signed)
MODIFIED TEST- WORKING FROM 6LPM BASELINE DUE TO HIGH O2 NEEDS  Please see in addition to PT note:   SATURATION QUALIFICATIONS: (This note is used to comply with regulatory documentation for home oxygen)  Patient Saturations on 6LPM at Rest = 93%  Patient Saturations on 6LPM while Ambulating =  76%  Patient Saturations on 8 Liters of oxygen while Ambulating = 80% at lowest, but able to maintain at 85-89% for low level tasks (transfers, only walking a few feet, etc)   Please briefly explain why patient needs home oxygen:Needs at least 8LPM for low level activities, may need as much as 10LPM when attempting to walk further than a few feet  Windell Norfolk, DPT, [place]    Pager [phone] Acute Rehab Office [phone]

## 2020-09-05 ENCOUNTER — Other Ambulatory Visit (HOSPITAL_COMMUNITY): Payer: Self-pay

## 2020-09-05 DIAGNOSIS — I272 Pulmonary hypertension, unspecified: Secondary | ICD-10-CM | POA: Diagnosis not present

## 2020-09-05 DIAGNOSIS — J9621 Acute and chronic respiratory failure with hypoxia: Secondary | ICD-10-CM | POA: Diagnosis not present

## 2020-09-05 LAB — BASIC METABOLIC PANEL
Anion gap: 8 (ref 5–15)
BUN: 24 mg/dL — ABNORMAL HIGH (ref 8–23)
CO2: 21 mmol/L — ABNORMAL LOW (ref 22–32)
Calcium: 9.4 mg/dL (ref 8.9–10.3)
Chloride: 106 mmol/L (ref 98–111)
Creatinine, Ser: 0.82 mg/dL (ref 0.44–1.00)
GFR, Estimated: >60 mL/min (ref >60)
Glucose, Bld: 128 mg/dL — ABNORMAL HIGH (ref 70–99)
Potassium: 4 mmol/L (ref 3.5–5.1)
Sodium: 135 mmol/L (ref 135–145)

## 2020-09-05 LAB — CBC
HCT: 41 % (ref 36.0–46.0)
Hemoglobin: 13.9 g/dL (ref 12.0–15.0)
MCH: 31 pg (ref 26.0–34.0)
MCHC: 33.9 g/dL (ref 30.0–36.0)
MCV: 91.5 fL (ref 80.0–100.0)
Platelets: 267 10*3/uL (ref 150–400)
RBC: 4.48 MIL/uL (ref 3.87–5.11)
RDW: 13.6 % (ref 11.5–15.5)
WBC: 16.4 10*3/uL — ABNORMAL HIGH (ref 4.0–10.5)
nRBC: 0 % (ref 0.0–0.2)

## 2020-09-05 LAB — MAGNESIUM: Magnesium: 2.3 mg/dL (ref 1.7–2.4)

## 2020-09-05 NOTE — Progress Notes (Addendum)
Patient ID: [name], female   DOB: [birth year], 80 y.o.   MRN: [id number]     Advanced Heart Failure Rounding Note  PCP-Cardiologist: None   Subjective:    Started on macitentan after cath. Started on 20 mg tadalafil 5/25. Remains on prednisone. Pulmonology following.   Less O2 requirements today, [place] down from 7>>5L/min (home baseline 5L)   Feels ok this morning. No complaints.   Cardiac Studies    RHC 5/23  Hemodynamics (mmHg) RA mean 1 RV 67/1 PA 72/13, mean 36 PCWP mean 1 Oxygen saturations: PA 74% AO 99% Cardiac Output (Fick) 4.61  Cardiac Index (Fick) 2.88 PVR 7.6 WU  Echo: EF 60-65%, mild LVH, moderate RV enlargement, mildly decreased RV systolic function, PASP 73 mmHg.   High resolution CT chest: No evidence for ILD, mild emphysema, mediastinal LAN less prominent and likely reactive.   Objective:   Weight Range: 59.6 kg Body mass index is 24.03 kg/m.   Vital Signs:   Temp:  [97.5 F (36.4 C)-98.7 F (37.1 C)] 97.9 F (36.6 C) (05/27 0829) Pulse Rate:  [97-108] 100 (05/27 0829) Resp:  [18-24] 18 (05/27 0829) BP: (109-128)/(60-69) 123/62 (05/27 0829) SpO2:  [92 %-99 %] 99 % (05/27 0829) Last BM Date: 09/04/20  Weight change: Filed Weights   08/27/20 1433 08/28/20 1317 09/01/20 0505  Weight: 58.8 kg 58.8 kg 59.6 kg    Intake/Output:  No intake or output data in the 24 hours ending 09/05/20 0910    Physical Exam    PHYSICAL EXAM: General:  Well appearing. No respiratory difficulty HEENT: normal Neck: supple. no JVD. Carotids 2+ bilat; no bruits. No lymphadenopathy or thyromegaly appreciated. Cor: PMI nondisplaced. Regular rate & rhythm. No rubs, gallops or murmurs. Lungs: clear Abdomen: soft, nontender, nondistended. No hepatosplenomegaly. No bruits or masses. Good bowel sounds. Extremities: no cyanosis, clubbing, rash, edema Neuro: alert & oriented x 3, cranial nerves grossly intact. moves all 4 extremities w/o difficulty. Affect  pleasant.    Telemetry   NSR-ST 90s-low 100s personally reviewed.    Labs    CBC Recent Labs    09/04/20 0404 09/05/20 0106  WBC 15.3* 16.4*  HGB 13.1 13.9  HCT 40.4 41.0  MCV 94.0 91.5  PLT 258 283   Basic Metabolic Panel Recent Labs    09/04/20 0137 09/05/20 0106  NA 136 135  K 4.6 4.0  CL 109 106  CO2 19* 21*  GLUCOSE 91 128*  BUN 29* 24*  CREATININE 0.94 0.82  CALCIUM 8.8* 9.4  MG 2.1 2.3   Liver Function Tests No results for input(s): AST, ALT, ALKPHOS, BILITOT, PROT, ALBUMIN in the last 72 hours. No results for input(s): LIPASE, AMYLASE in the last 72 hours. Cardiac Enzymes No results for input(s): CKTOTAL, CKMB, CKMBINDEX, TROPONINI in the last 72 hours.  BNP: BNP (last 3 results) Recent Labs    05/01/20 1232 08/28/20 1406 08/30/20 0936  BNP 80.9 218.6* 107.9*    ProBNP (last 3 results) No results for input(s): PROBNP in the last 8760 hours.   D-Dimer No results for input(s): DDIMER in the last 72 hours. Hemoglobin A1C No results for input(s): HGBA1C in the last 72 hours. Fasting Lipid Panel No results for input(s): CHOL, HDL, LDLCALC, TRIG, CHOLHDL, LDLDIRECT in the last 72 hours. Thyroid Function Tests No results for input(s): TSH, T4TOTAL, T3FREE, THYROIDAB in the last 72 hours.  Invalid input(s): FREET3  Other results:   Imaging    No results found.   Medications:  Scheduled Medications: . amLODipine  5 mg Oral Daily  . arformoterol  15 mcg Nebulization BID  . aspirin  81 mg Oral Daily  . budesonide  0.5 mg Nebulization BH-qamhs  . clopidogrel  75 mg Oral Daily  . diclofenac Sodium  2 g Topical QID  . enoxaparin (LOVENOX) injection  40 mg Subcutaneous Q24H  . gabapentin  100 mg Oral QHS  . levothyroxine  75 mcg Oral Q0600  . loratadine  10 mg Oral Daily  . macitentan  10 mg Oral Daily  . mouth rinse  15 mL Mouth Rinse BID  . metoprolol succinate  25 mg Oral Daily  . mupirocin ointment  1 application Nasal BID   . pantoprazole  40 mg Oral Daily  . simvastatin  20 mg Oral q1800  . sodium chloride flush  3 mL Intravenous Q12H  . tadalafil  20 mg Oral Daily    Infusions:   PRN Medications: acetaminophen, albuterol, diazepam, docusate sodium, fluticasone, guaiFENesin-dextromethorphan, ondansetron (ZOFRAN) IV   Assessment/Plan   1. Pulmonary hypertension:  Echo (5/22) with EF 60-65%, mild LVH, moderate RV enlargement, mildly decreased RV systolic function, PASP 73 mmHg. High resolution CT chest with no evidence for ILD, mild emphysema, mediastinal LAN less prominent than on prior CT and likely reactive. She had a V/Q scan in 9/21 that was not suggestive of chronic PEs and lower extremity venous dopplers with no DVT.  Unable to obtain PFTs.  She has mild OSA.  Autoimmune serologies negative.  RHC with PAH, PVR 7.6 WU.  Cause of PAH is uncertain, concerned for component of group 1 PH as mild OSA and mild COPD do not appear to explain.  There has been some concern that she could have sarcoidosis, but CT chest is not clearly suggestive of this. She is improving, less O2 requirements today, down from 7>>5L/min (prior home baseline was 5L/min).  I do think that it would be reasonable at this point to treat for group 1 PH with trial of selective pulmonary vasodilators.  - Started opsumit 09/01/20 . Patient assistance submitted. Specialty pharmacy will deliver once apprvoed.  - Started tadalafil 09/03/20, will make sure with pharmacy that she can get this for home.  - Treatment of OSA and COPD per pulmonary.  - Further workup for sarcoidosis => per pulmonary. She will remain on prednisone for now.  - No further Lasix, low filling pressures.  2. COPD: Mild, currently treating for component of exacerbation with prednisone taper and bronchodilators.  3. OSA: Mild.  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   - On ASA/Plavix and Zocor.   Length of Stay: [address] PA-C  09/05/2020, 9:10 AM  Advanced Heart Failure Team Pager [phone] (M-F; 7a - 5p)  Please contact [facility] for night-coverage after hours (5p -7a ) and weekends on [email or web address]  Agree with the above PA note.   She has started on Opsumit and tadalafil for PAH.  Further workup of sarcoidosis per pulmonary, currently on prednisone.  We will arrange outpatient followup.  Will follow at a distance over the weekend, will see again next week if she remains in the hospital.   Loralie Champagne 09/05/2020

## 2020-09-05 NOTE — TOC Progression Note (Signed)
Transition of Care [facility]) - Progression Note    Patient Details  Name: [name] MRN: [id number] Date of Birth: [birth year]  Transition of Care [facility]) CM/SW [place], RN Phone Number: 09/05/2020, 3:13 PM  Clinical Narrative:    Patient still requiring 8-10 Liters of oxygen, does feel less dyspneic. Saturations nad qualifications done yesterday. Will need to get qualifications tomorrow to see if improvements were made. Has HH ageny set up. Lives with daughters.    Expected Discharge Plan: [place] Barriers to Discharge: Continued Medical Work up  Expected Discharge Plan and Services Expected Discharge Plan: [place]   Discharge Planning Services: CM Consult Post Acute Care Choice: [place] arrangements for the past 2 months: [place]: PT,OT [place]: [place] Date [facility] Agency Contacted: 09/03/20 Time HH Agency Contacted: 1137 Representative spoke with at [place]: Tommi Rumps   Social Determinants of Health (SDOH) Interventions Food Insecurity Interventions: Intervention Not Indicated Financial Strain Interventions: Intervention Not Indicated Housing Interventions: Intervention Not Indicated Transportation Interventions: Intervention Not Indicated,Other (Comment) (patient reports her daughter takes her to appointments)  Readmission Risk Interventions No flowsheet data found.

## 2020-09-05 NOTE — Care Management Important Message (Signed)
Important Message  Patient Details  Name: [name] MRN: [id number] Date of Birth: 1940/12/26   Medicare Important Message Given:  Yes - Important Message mailed due to current National Emergency   Verbal consent obtained due to current National Emergency  Relationship to patient: Self Contact Name: [place] Call Date: 09/05/20  Time: 0918 Phone: [phone] Outcome: Spoke with contact Important Message mailed to: Patient address on file    Delorse Lek 09/05/2020, 9:19 AM

## 2020-09-05 NOTE — Progress Notes (Signed)
Physical Therapy Treatment Patient Details Name: [name] MRN: [id number] DOB: 15-Jan-1941 Today's Date: 09/05/2020    History of Present Illness 80 y.o. female brought to ED for shortness of breath 08/27/20 via EMS Sats of 75% and RR of 50 on RA that improved to 95% and rr of 30 on 10L. Admitted for treatment of acute exacerbation of COPD. Pt is s/p heart cath on 5/23.  PMH: COPD, HTN, Hypothyroidism.    PT Comments    The pt was received in bed, eager to mobilize with PT this session as she is optimistic to return to prior level of endurance and activity. The pt was able to complete bed mobility without physical assist, but had drop in SpO2 from 88-90% at rest to low of 79% with effort on 6L. O2 increased to 8L and pt recovered to 90 with 5 min seated rest and guided breathing. The pt then completed x2 20 ft bouts of walking with 5 min seated rest to recover SOB and SpO2 from low of 83% on 8L to 90. The pt was educated on progressive walking program for home with good understanding of self-monitoring, use of pulse-ox to monitor, and how to progress. Will continue to benefit from skilled PT acutely and following d/c to maintain SpO2 with mobility.     Follow Up Recommendations  Home health PT;Supervision/Assistance - 24 hour     Equipment Recommendations  3in1 (PT);Wheelchair (measurements PT);Rolling walker with 5" wheels;Wheelchair cushion (measurements PT)    Recommendations for Other Services       Precautions / Restrictions Precautions Precautions: Fall;Other (comment) Precaution Comments: watch sats, O2 >85% per order set with activity, 88% at rest Restrictions Weight Bearing Restrictions: No    Mobility  Bed Mobility Overal bed mobility: Modified Independent             General bed mobility comments: increased time and effort to complete, no physical assist given    Transfers Overall transfer level: Needs assistance Equipment used: Rolling walker (2  wheeled) Transfers: Sit to/from Stand Sit to Stand: Min guard         General transfer comment: cues for technique with RW  Ambulation/Gait Ambulation/Gait assistance: Min guard Gait Distance (Feet): 20 Feet (x2) Assistive device: Rolling walker (2 wheeled) Gait Pattern/deviations: Step-through pattern;Decreased step length - right;Decreased step length - left;Trunk flexed Gait velocity: decreased Gait velocity interpretation: <1.31 ft/sec, indicative of household ambulator General Gait Details: pt able to steady with RW, completed 20 ft x2 with SpO2 low of 83% on 8L after 20 ft. recovered to high 90s after 5 min seated rest, completed second 20 ft with 8L O2 and a second 5 min seated rest. slight difficulty managing RW in tight spaces without cues       Balance Overall balance assessment: Needs assistance Sitting-balance support: No upper extremity supported Sitting balance-Leahy Scale: Good Sitting balance - Comments: no LOB donning socks   Standing balance support: No upper extremity supported Standing balance-Leahy Scale: Fair Standing balance comment: BUE support for gait (energy conservation), but able to static stand without UE support                            Cognition Arousal/Alertness: Awake/alert Behavior During Therapy: WFL for tasks assessed/performed Overall Cognitive Status: Within Functional Limits for tasks assessed  General Comments: pt very pleasant and motivated      Exercises      General Comments General comments (skin integrity, edema, etc.): SpO2 88-90% on 6L at rest, desat to 79% with transition to sitting EOB, pt cued to slow breathing and focus on PLB, RR decreased from 38 to 30, SpO2 improved to 90%. SpO2 on 8L for 20 ft ambulation maintained mostly >85%, dropeed to 83% after 20 ft. 5 min seated rest to recover after mobility      Pertinent Vitals/Pain Pain Assessment: No/denies pain            PT Goals (current goals can now be found in the care plan section) Acute Rehab PT Goals Patient Stated Goal: to be able to walk more PT Goal Formulation: With patient Time For Goal Achievement: 09/13/20 Potential to Achieve Goals: Good Progress towards PT goals: Progressing toward goals    Frequency    Min 3X/week      PT Plan Current plan remains appropriate       AM-PAC PT "6 Clicks" Mobility   Outcome Measure  Help needed turning from your back to your side while in a flat bed without using bedrails?: None Help needed moving from lying on your back to sitting on the side of a flat bed without using bedrails?: None Help needed moving to and from a bed to a chair (including a wheelchair)?: A Little Help needed standing up from a chair using your arms (e.g., wheelchair or bedside chair)?: A Little Help needed to walk in hospital room?: A Little Help needed climbing 3-5 steps with a railing? : A Lot 6 Click Score: 19    End of Session Equipment Utilized During Treatment: Oxygen Activity Tolerance: Patient limited by fatigue Patient left: in chair;with call bell/phone within reach Nurse Communication: Mobility status PT Visit Diagnosis: Other abnormalities of gait and mobility (R26.89);Difficulty in walking, not elsewhere classified (R26.2);Muscle weakness (generalized) (M62.81)     Time: 0488-8916 PT Time Calculation (min) (ACUTE ONLY): 37 min  Charges:  $Gait Training: 8-22 mins $Therapeutic Activity: 8-22 mins                     [name], PT, DPT   Acute Rehabilitation Department Pager #: [phone]   [name] 09/05/2020, 3:38 PM

## 2020-09-05 NOTE — TOC Progression Note (Signed)
Transition of Care (TOC) - Progression Note  Heart Failure   Patient Details  Name: [name] MRN: [id number] Date of Birth: [birth year]  Transition of Care [facility]) CM/SW [place], [place] Phone Number: 09/05/2020, 3:52 PM  Clinical Narrative:    CSW spoke with the patient at bedside and provided Mrs. Toney Rakes with an appointment card for the [facility] outpatient clinic and encouraged them to follow up and to attend the appointment and bring their medications and if anything changes to please reach out so that CSW/HV clinic team can provide support.  TOC will continue to follow for discharge needs.  Expected Discharge Plan: [place] Barriers to Discharge: Continued Medical Work up  Expected Discharge Plan and Services Expected Discharge Plan: [place]   Discharge Planning Services: CM Consult Post Acute Care Choice: [place] arrangements for the past 2 months: [place]: PT,OT [place]: [place] Date [facility] Agency Contacted: 09/03/20 Time HH Agency Contacted: [phone] Representative spoke with at [place]: Tommi Rumps   Social Determinants of Health (SDOH) Interventions Food Insecurity Interventions: Intervention Not Indicated Financial Strain Interventions: Intervention Not Indicated Housing Interventions: Intervention Not Indicated Transportation Interventions: Intervention Not Indicated,Other (Comment) (patient reports her daughter takes her to appointments)  Readmission Risk Interventions No flowsheet data found.  Oval Cavazos, MSW, [place] Heart Failure Social Worker

## 2020-09-05 NOTE — Progress Notes (Signed)
Progress Note    [name]  ONG:[id number] DOB: 12/22/1940  DOA: 08/27/2020 PCP: [name], MD    Brief Narrative:     Medical records reviewed and are as summarized below:  [name] is an 80 y.o. female with medical history significant of COPD, HTN, Hypothyroidism. She is followed by [name] for pulmonary with last OV 08/15/20. She has been having increasing SOB with increasing need for Oxygen now up to 5 L [place]. Due to increasing symptoms she contacted her doctor who called in Augmentin and started her on oral prednisone. She continued to be short of breath with progressive tachypnea and sats of 75% on 4L. EMS activated. She was put to 15L NRB and transported to [facility] for evaluation.  On echo was found to have worsening pulmonary HTN,  Card/pulm consult appreciated.  S/p cath on 5/23.  Opsumit started.  Wean O2 as able.   Assessment/Plan:   Principal Problem:   Acute on chronic respiratory failure with hypoxia (HCC) Active Problems:   Essential hypertension   Hypothyroidism   Pulmonary hypertension (HCC)   COPD with acute exacerbation (HCC)  Acute on chronic hypoxic respiratory failure Worsening pulmonary HTN  -Follows with [name] for pulmonary.  -CXR clear, CTA chest w/o infiltrates -  procalcitonin negative so abx d/c'ed Plan: --cont tadalafil --cont Opsumit (new)  --cont home nebs --Continue supplemental O2 to keep sats between 88-92%, wean as tolerated  COPD with acute exacerbation Chronic hypoxic respiratory failure on home O2 --s/p steroid burst --cont nebs scheduled   HTN  --cont amlodipine and Toprol  Hypothyroidism --cont Synthroid  GERD:  --cont PPI   Family Communication/Anticipated D/C date and plan/Code Status   DVT prophylaxis: Lovenox SQ Code Status: Full code  Family Communication: daughter updated on the phone today Status is: inpatient Dispo:   The patient is from: home Anticipated d/c is to:  home Anticipated d/c date is: likely tomorrow  Patient currently is medically stable to d/c.    Difficult to place patient No         Medical Consultants:    PCCM  cards    Subjective:   No complaint.  Walked with PT today.  O2 requirement continued to decrease.   Objective:    Vitals:   09/05/20 0829 09/05/20 1155 09/05/20 1617 09/05/20 1717  BP: 123/62 124/64 110/63 118/62  Pulse: 100 (!) 111 (!) 114 (!) 103  Resp: _0 Temp: 97.9 F (36.6 C) 97.9 F (36.6 C) 97.9 F (36.6 C) 98.7 F (37.1 C)  TempSrc: Oral Oral Oral Oral  SpO2: 99% 91% 92% 95%  Weight:      Height:        Intake/Output Summary (Last 24 hours) at 09/05/2020 1755 Last data filed at 09/05/2020 1738 Gross per 24 hour  Intake 240 ml  Output 300 ml  Net -60 ml   Filed Weights   08/27/20 1433 08/28/20 1317 09/01/20 0505  Weight: 58.8 kg 58.8 kg 59.6 kg    Exam: Constitutional: NAD, AAOx3, walking with walker with PT HEENT: conjunctivae and lids normal, EOMI CV: No cyanosis.   RESP: normal respiratory effort, on [place] Neuro: II - XII grossly intact.   Psych: Normal mood and affect.  Appropriate judgement and reason   Data Reviewed:   I have personally reviewed following labs and imaging studies:  Labs: Labs show the following:   Basic Metabolic Panel: Recent Labs  Lab 08/30/20 0936 09/01/20  0139 09/01/20 0831 09/04/20 0137 09/05/20 0106  NA 138 136 143  142 136 135  K 3.8 4.4 3.7  3.7 4.6 4.0  CL 109 109  --  109 106  CO2 20* 20*  --  19* 21*  GLUCOSE 118* 93  --  91 128*  BUN 31* 28*  --  29* 24*  CREATININE 1.02* 0.80  --  0.94 0.82  CALCIUM 9.3 9.2  --  8.8* 9.4  MG  --   --   --  2.1 2.3   GFR Estimated Creatinine Clearance: 42.6 mL/min (by C-G formula based on SCr of 0.82 mg/dL). Liver Function Tests: Recent Labs  Lab 09/01/20 0139  AST 26  ALT 24  ALKPHOS 61  BILITOT 0.9  PROT 7.1  ALBUMIN 3.3*   No results for input(s): LIPASE, AMYLASE in  the last 168 hours. No results for input(s): AMMONIA in the last 168 hours. Coagulation profile No results for input(s): INR, PROTIME in the last 168 hours.  CBC: Recent Labs  Lab 09/01/20 0139 09/01/20 0831 09/04/20 0404 09/05/20 0106  WBC 14.2*  --  15.3* 16.4*  HGB 13.6 14.3  14.3 13.1 13.9  HCT 41.4 42.0  42.0 40.4 41.0  MCV 93.7  --  94.0 91.5  PLT 269  --  258 267   Cardiac Enzymes: No results for input(s): CKTOTAL, CKMB, CKMBINDEX, TROPONINI in the last 168 hours. BNP (last 3 results) No results for input(s): PROBNP in the last 8760 hours. CBG: No results for input(s): GLUCAP in the last 168 hours. D-Dimer: No results for input(s): DDIMER in the last 72 hours. Hgb A1c: No results for input(s): HGBA1C in the last 72 hours. Lipid Profile: No results for input(s): CHOL, HDL, LDLCALC, TRIG, CHOLHDL, LDLDIRECT in the last 72 hours. Thyroid function studies: No results for input(s): TSH, T4TOTAL, T3FREE, THYROIDAB in the last 72 hours.  Invalid input(s): FREET3 Anemia work up: No results for input(s): VITAMINB12, FOLATE, FERRITIN, TIBC, IRON, RETICCTPCT in the last 72 hours. Sepsis Labs: Recent Labs  Lab 09/01/20 0139 09/04/20 0404 09/05/20 0106  WBC 14.2* 15.3* 16.4*    Microbiology Recent Results (from the past 240 hour(s))  Resp Panel by RT-PCR (Flu A&B, Covid) Nasopharyngeal Swab     Status: None   Collection Time: 08/27/20  3:03 PM   Specimen: Nasopharyngeal Swab; Nasopharyngeal(NP) swabs in vial transport medium  Result Value Ref Range Status   SARS Coronavirus 2 by RT PCR NEGATIVE NEGATIVE Final    Comment: (NOTE) SARS-CoV-2 target nucleic acids are NOT DETECTED.  The SARS-CoV-2 RNA is generally detectable in upper respiratory specimens during the acute phase of infection. The lowest concentration of SARS-CoV-2 viral copies this assay can detect is 138 copies/mL. A negative result does not preclude SARS-Cov-2 infection and should not be used as the  sole basis for treatment or other patient management decisions. A negative result may occur with  improper specimen collection/handling, submission of specimen other than nasopharyngeal swab, presence of viral mutation(s) within the areas targeted by this assay, and inadequate number of viral copies(<138 copies/mL). A negative result must be combined with clinical observations, patient history, and epidemiological information. The expected result is Negative.  Fact Sheet for Patients:  EntrepreneurPulse.com.au  Fact Sheet for Healthcare Providers:  IncredibleEmployment.be  This test is no t yet approved or cleared by the [place] FDA and  has been authorized for detection and/or diagnosis of SARS-CoV-2 by FDA under an Emergency Use Authorization (EUA). This EUA  will remain  in effect (meaning this test can be used) for the duration of the COVID-19 declaration under Section 564(b)(1) of the Act, 21 U.S.C.section 360bbb-3(b)(1), unless the authorization is terminated  or revoked sooner.       Influenza A by PCR NEGATIVE NEGATIVE Final   Influenza B by PCR NEGATIVE NEGATIVE Final    Comment: (NOTE) The Xpert Xpress SARS-CoV-2/FLU/RSV plus assay is intended as an aid in the diagnosis of influenza from Nasopharyngeal swab specimens and should not be used as a sole basis for treatment. Nasal washings and aspirates are unacceptable for Xpert Xpress SARS-CoV-2/FLU/RSV testing.  Fact Sheet for Patients: EntrepreneurPulse.com.au  Fact Sheet for Healthcare Providers: IncredibleEmployment.be  This test is not yet approved or cleared by the [place] FDA and has been authorized for detection and/or diagnosis of SARS-CoV-2 by FDA under an Emergency Use Authorization (EUA). This EUA will remain in effect (meaning this test can be used) for the duration of the COVID-19 declaration under Section 564(b)(1) of the  Act, 21 U.S.C. section 360bbb-3(b)(1), unless the authorization is terminated or revoked.  Performed at [facility], [place] [address], [place], [place] [address]   MRSA PCR Screening     Status: Abnormal   Collection Time: 09/01/20  5:49 AM   Specimen: Nasal Mucosa; Nasopharyngeal  Result Value Ref Range Status   MRSA by PCR POSITIVE (A) NEGATIVE Final    Comment:        The GeneXpert MRSA Assay (FDA approved for NASAL specimens only), is one component of a comprehensive MRSA colonization surveillance program. It is not intended to diagnose MRSA infection nor to guide or monitor treatment for MRSA infections. RESULT CALLED TO, READ BACK BY AND VERIFIED WITH: RN K.HELL AT 0800 ON 09/01/2020 BY T.SAAD. Performed at [facility], [place] [address], [place], [place] [address]     Procedures and diagnostic studies:  No results found.  Medications:   . amLODipine  5 mg Oral Daily  . arformoterol  15 mcg Nebulization BID  . aspirin  81 mg Oral Daily  . budesonide  0.5 mg Nebulization BH-qamhs  . clopidogrel  75 mg Oral Daily  . diclofenac Sodium  2 g Topical QID  . enoxaparin (LOVENOX) injection  40 mg Subcutaneous Q24H  . gabapentin  100 mg Oral QHS  . levothyroxine  75 mcg Oral Q0600  . loratadine  10 mg Oral Daily  . macitentan  10 mg Oral Daily  . mouth rinse  15 mL Mouth Rinse BID  . metoprolol succinate  25 mg Oral Daily  . mupirocin ointment  1 application Nasal BID  . pantoprazole  40 mg Oral Daily  . simvastatin  20 mg Oral q1800  . sodium chloride flush  3 mL Intravenous Q12H  . tadalafil  20 mg Oral Daily   Continuous Infusions:    LOS: 9 days   Enzo Bi  [facility]   How to contact the [facility] Attending or Consulting provider [place] or covering provider during after hours [place], for this patient?  1. Check the care team in [facility] and look for a) attending/consulting TRH provider listed and b) the [facility] team listed 2. Log into [email or web address]  and use [place]'s universal password to access. If you do not have the password, please contact the hospital operator. 3. Locate the [facility] provider you are looking for under [facility] and page to a number that you can be directly reached. 4.  If you still have difficulty reaching the provider, please page the [facility] (Director on Call) for the Hospitalists listed on amion for assistance.  09/05/2020, 5:55 PM

## 2020-09-05 NOTE — Telephone Encounter (Signed)
Advanced Heart Failure Patient Advocate Encounter  Prior Authorization for Opsumit has been approved.    PA# [id number] Effective dates: 09/04/20 through further notice  PA approval sent to [facility].

## 2020-09-05 NOTE — Progress Notes (Addendum)
Occupational Therapy Treatment Patient Details Name: [name] MRN: [id number] DOB: Jul 18, 1940 Today's Date: 09/05/2020    History of present illness 80 y.o. female brought to ED for shortness of breath 08/27/20 via EMS Sats of 75% and RR of 50 on RA that improved to 95% and rr of 30 on 10L. Admitted for treatment of acute exacerbation of COPD. Pt is s/p heart cath on 5/23.  PMH: COPD, HTN, Hypothyroidism.   OT comments  Pt received in chair. Assisted to change gown soiled by peach juice. Pt ambulated with min guard assist to sink with RW and washed her hands. Educated pt in energy conservation strategies and breathing techniques. Pt reports feeling much better.   Follow Up Recommendations  Home Health OT;Supervision/Assistance - 24 hour    Equipment Recommendations  3 in 1 bedside commode    Recommendations for Other Services      Precautions / Restrictions Precautions Precaution Comments: watch sats, O2 >85% per order set with activity, 88% at rest       Mobility Bed Mobility               General bed mobility comments: pt in chair    Transfers Overall transfer level: Needs assistance Equipment used: Rolling walker (2 wheeled) Transfers: Sit to/from Stand Sit to Stand: Min guard         General transfer comment: cues for technique with RW    Balance Overall balance assessment: Needs assistance   Sitting balance-Leahy Scale: Good     Standing balance support: No upper extremity supported Standing balance-Leahy Scale: Fair Standing balance comment: at sink                           ADL either performed or assessed with clinical judgement   ADL Overall ADL's : Needs assistance/impaired     Grooming: Wash/dry hands;Min guard;Standing           Upper Body Dressing : Sitting;Set up                   Functional mobility during ADLs: Minimal assistance;Rolling walker General ADL Comments: Continued education in energy  conservation and pursed lip breathing techniques.     Vision       Perception     Praxis      Cognition Arousal/Alertness: Awake/alert Behavior During Therapy: WFL for tasks assessed/performed Overall Cognitive Status: Within Functional Limits for tasks assessed                                 General Comments: Pt states she feels "great."        Exercises     Shoulder Instructions       General Comments      Pertinent Vitals/ Pain       Pain Assessment: No/denies pain  Home Living                                          Prior Functioning/Environment              Frequency  Min 2X/week        Progress Toward Goals  OT Goals(current goals can now be found in the care plan section)  Progress towards OT goals: Progressing toward goals  Acute Rehab  OT Goals Patient Stated Goal: to be able to walk more OT Goal Formulation: With patient Time For Goal Achievement: 09/17/20 Potential to Achieve Goals: Good  Plan     Co-evaluation                 AM-PAC OT "6 Clicks" Daily Activity     Outcome Measure   Help from another person eating meals?: None Help from another person taking care of personal grooming?: A Little Help from another person toileting, which includes using toliet, bedpan, or urinal?: A Little Help from another person bathing (including washing, rinsing, drying)?: A Little Help from another person to put on and taking off regular upper body clothing?: None Help from another person to put on and taking off regular lower body clothing?: A Little 6 Click Score: 20    End of Session Equipment Utilized During Treatment: Rolling walker;Oxygen;Gait belt (3L)  OT Visit Diagnosis: Unsteadiness on feet (R26.81);Other abnormalities of gait and mobility (R26.89);Muscle weakness (generalized) (M62.81);Other (comment)   Activity Tolerance Patient tolerated treatment well   Patient Left in chair;with call  bell/phone within reach;with chair alarm set   Nurse Communication          Time: [phone] OT Time Calculation (min): 19 min  Charges: OT General Charges $OT Visit: 1 Visit OT Treatments $Self Care/Home Management : 8-22 mins  [name], OTR/L Acute Rehabilitation Services Pager: [phone] Office: [phone]   [name] 09/05/2020, 3:00 PM

## 2020-09-05 NOTE — Progress Notes (Addendum)
NAME:  [name], MRN:  [id number], DOB:  [birth year], LOS: 9 ADMISSION DATE:  08/27/2020, CONSULTATION DATE:  5/21 REFERRING MD:  Dr. Lucianne Lei, CHIEF COMPLAINT:  Dyspnea   History of Present Illness:  80 y/o female admitted with dyspnea in setting of acute on chronic pulmonary hypertension.  Has had an extensive work up as outpatient.  At baseline has mild emphysema and OSA, but no ILD.    Pertinent  Medical History  COPD GERD Pulmonary hypertension  Significant Hospital Events: Including procedures, antibiotic start and stop dates in addition to other pertinent events   . 5/20 admitted, PCCM consult 5/21 . 5/23 RHC 5/23 (see below), opsumit started . 5/25 tadalafil started  Interim History / Subjective:  O2 at 5L sats mid low 90s. Says feeling better, breathing better.  Objective   Blood pressure 123/62, pulse 100, temperature 97.9 F (36.6 C), temperature source Oral, resp. rate 18, height _0  (1.575 m), weight 59.6 kg, SpO2 99 %.       No intake or output data in the 24 hours ending 09/05/20 1116 Filed Weights   08/27/20 1433 08/28/20 1317 09/01/20 0505  Weight: 58.8 kg 58.8 kg 59.6 kg    General: elderly adult female lying in bed in NAD  HEENT: MM pink/moist,  [place] O2, anicteric  Neuro: AAOx4, speech clear, MAE CV: s1s2 RRR, no m/r/g PULM: non-labored at rest, clear anterior, R>L basilar crackles  GI: soft, bsx4 active  Extremities: warm/dry, no edema  Skin: no rashes or lesions   Labs/imaging that I havepersonally reviewed  (right click and "Reselect all SmartList Selections" daily)  CTA 5/18-no pulmonary embolism mild emphysema, distal atelectasis and scattered mediastinal and hilar lymphadenopathy that is stable.  Echocardiogram 08/29/2020- LVEF 60 to 65%, mild concentric LVH, grade 1 diastolic dysfunction.  45-year-old pulmonary hypertension, estimated RVSP 73.3  High-res CT 08/30/2020- RUL nodules, pleural based, mild emphysema and airway thickening, some  reticular changes in upper lobes and some lower lobe non-specific interstitial changes without honeycombing or traction bronchiectasis  RHC 09/01/2020> RA mean 1 RV 67/1 PA 72/13, mean 36 PCWP mean 1  Oxygen saturations: PA 74% AO 99%  Cardiac Output (Fick) 4.61  Cardiac Index (Fick) 2.88 PVR 7.6 WU  Outpatient work-up Sleep study 03/19/2020- AHI 5.5 with desats  VQ scan 12/27/2019-low probability for PE  Lower extremity duplex 12/27/19 negative for DVT  Autoimmune serologies 12/27/2019-negative  Unable to complete PFTs  Resolved Hospital Problem list     Assessment & Plan:   Acute on chronic hypoxemic respiratory failure due to complex picture of mild emphysema, non-specific interstitial lung disease/scarring and underlying pulmonary hypertension > with symptomatic improvement with prednisone. 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  AE COPD -Completed cefepime and prednisone -continue brovana + pulmicort   Unexplained diffuse parenchymal lung disease, see discussion above -consider resuming prednisone if symptoms worsen -CT scan relatively reassuring -follow up in pulmonary clinic arranged   Pulmonary hypertension: WHO group 1 and 3 (group 5 if sarcoid present), concern that there is disproportionately elevated pulmonary vascular pressure.  Would be nice to have spirometry data at a minimum. Perhaps she can repeat PFT as outpatient. PVR > 7 with preserved CO and CI on RHC. -will need outpatient PFT -macitentan, tadalfil per Cardiology, pre-approval pending  -will need to monitor LFT's  -follow daily weights, fluid restriction   Chronic respiraotry failure with hypoxemia: resting O2 requirement improved  (was on 5 liters when  I came to her room today) -wean O2 for sats >88% -will ask Care Mgmt to assist in getting a concentrator that will allow for increased O2 support at home - MUST HAVE SATURATIONS CHECKED  WITH AMBULATION SO CLEAR GUIDANCE ON OXYGEN THERAPY CAN BE PROVIDED ON DISCHARGE -PT consult > may need short term rehab  -mobilize    We will sign off. Best practice (right click and "Reselect all SmartList Selections" daily)   Per primary   Critical care time: n/a     Lanier Clam, MD  [facility] 09/05/2020, 11:16 AM   Please see [email or web address] for pager details.

## 2020-09-05 NOTE — TOC Progression Note (Addendum)
Transition of Care [facility]) - Progression Note    Patient Details  Name: [name] MRN: [id number] Date of Birth: April 04, 1941  Transition of Care [facility]) CM/SW Stella, RN Phone Number: 09/05/2020, 4:09 PM  Clinical Narrative:    Orders in for oxygen 8L [employer] called they wil get a concentrator for patient. Eligibility sent for medication Opsumit.Marland Kitchenawaiting answer from insurance for outpatient therapy  Authroization was done through insurance, however in order to obtain this medication ( Dr Billie Ruddy  is aware) there must be a provider enrollment and a patient enrollment and Medical necessity form  From [email or web address] and [email or web address].  The medication has been ordered by heart failure team. Dr Billie Ruddy is going to converse with them to see when it will be delivered and what to prescribe in meantime. Patient will DC in AM.    Expected Discharge Plan: [place] Barriers to Discharge: Continued Medical Work up  Expected Discharge Plan and Services Expected Discharge Plan: [place]   Discharge Planning Services: CM Consult Post Acute Care Choice: [place] arrangements for the past 2 months: [place]: PT,OT [place]: [place] Date [facility] Agency Contacted: 09/03/20 Time HH Agency Contacted: 1137 Representative spoke with at [place]: Tommi Rumps   Social Determinants of Health (SDOH) Interventions Food Insecurity Interventions: Intervention Not Indicated Financial Strain Interventions: Intervention Not Indicated Housing Interventions: Intervention Not Indicated Transportation Interventions: Intervention Not Indicated,Other (Comment) (patient reports her daughter takes her to appointments)  Readmission Risk Interventions No flowsheet data found.

## 2020-09-05 NOTE — TOC Benefit Eligibility Note (Signed)
Transition of Care [facility]) Benefit Eligibility Note    Patient Details  Name: [name] MRN: [id number] Date of Birth: [birth year]   Medication/Dose: OPSUMIT  10 MG DAILY        Prescription Coverage [facility]erred [facility]              Additional Notes: MEDICAID OF [place] : [id number]   CO-PAY- $4.00 FOR EACH PRESCRIPTION    Memory Argue Phone Number: 09/05/2020, 4:48 PM

## 2020-09-06 DIAGNOSIS — J9621 Acute and chronic respiratory failure with hypoxia: Secondary | ICD-10-CM | POA: Diagnosis not present

## 2020-09-06 LAB — CBC
HCT: 38.9 % (ref 36.0–46.0)
Hemoglobin: 13 g/dL (ref 12.0–15.0)
MCH: 31.3 pg (ref 26.0–34.0)
MCHC: 33.4 g/dL (ref 30.0–36.0)
MCV: 93.5 fL (ref 80.0–100.0)
Platelets: 225 10*3/uL (ref 150–400)
RBC: 4.16 MIL/uL (ref 3.87–5.11)
RDW: 13.9 % (ref 11.5–15.5)
WBC: 16.5 10*3/uL — ABNORMAL HIGH (ref 4.0–10.5)
nRBC: 0 % (ref 0.0–0.2)

## 2020-09-06 LAB — BASIC METABOLIC PANEL
Anion gap: 6 (ref 5–15)
BUN: 30 mg/dL — ABNORMAL HIGH (ref 8–23)
CO2: 21 mmol/L — ABNORMAL LOW (ref 22–32)
Calcium: 9 mg/dL (ref 8.9–10.3)
Chloride: 108 mmol/L (ref 98–111)
Creatinine, Ser: 0.91 mg/dL (ref 0.44–1.00)
GFR, Estimated: >60 mL/min (ref >60)
Glucose, Bld: 139 mg/dL — ABNORMAL HIGH (ref 70–99)
Potassium: 3.9 mmol/L (ref 3.5–5.1)
Sodium: 135 mmol/L (ref 135–145)

## 2020-09-06 LAB — MAGNESIUM: Magnesium: 2.2 mg/dL (ref 1.7–2.4)

## 2020-09-06 MED ORDER — MACITENTAN 10 MG PO TABS
10.0000 mg | ORAL_TABLET | Freq: Every day | ORAL | Status: DC
Start: 2020-09-07 — End: 2020-11-29

## 2020-09-06 MED ORDER — TADALAFIL 20 MG PO TABS: 20.0000 mg | ORAL_TABLET | Freq: Every day | ORAL | 2 refills | Status: DC

## 2020-09-06 MED ORDER — LISINOPRIL 20 MG PO TABS: ORAL_TABLET | ORAL | 0 refills | Status: DC

## 2020-09-06 NOTE — Progress Notes (Signed)
Patient discharged home.  Daughter confirmed that home oxygen has been delivered at home.  Discharge instructions discussed with patient and daughter, both verbalize understanding.

## 2020-09-06 NOTE — TOC Transition Note (Signed)
Transition of Care [facility]) - CM/SW Discharge Note   Patient Details  Name: NADALYN DERINGER MRN: [id number] Date of Birth: Jan 13, 1941  Transition of Care [facility]) CM/SW Contact:  Carles Collet, RN Phone Number: 09/06/2020, 11:11 AM   Clinical Narrative:    Rx Opsumit will be mailed to house per attending. [employer]- coupon for Good Rx through [place] being sent home w patient and copy texted to daughter [name]. [employer] to deliver 10L concentrator to home today between 12-2p, daughter [name] aware.  (Patient lives approx 7 minutes from hospital. Standard E tank will last 1.4 hours at 8L minute.) Nurse will need to verify with daughter [name] that 10L concentrator has been delivered to the house prior to DC. Notified [facility] of DC. No other TOC needs identified     Final next level of care: [place] Barriers to Discharge: Continued Medical Work up   Patient Goals and CMS Choice Patient states their goals for this hospitalization and ongoing recovery are:: to go home CMS Medicare.gov Compare Post Acute Care list provided to:: Patient Choice offered to / list presented to : Patient  Discharge Placement                       Discharge Plan and Services   Discharge Planning Services: CM Consult Post Acute Care Choice: Home Health                    HH Arranged: PT,OT [place] Agency: [place] Date [facility] Agency Contacted: 09/03/20 Time HH Agency Contacted: 1137 Representative spoke with at [place]: Tommi Rumps  Social Determinants of Health (SDOH) Interventions Food Insecurity Interventions: Intervention Not Indicated Financial Strain Interventions: Intervention Not Indicated Housing Interventions: Intervention Not Indicated Transportation Interventions: Intervention Not Indicated,Other (Comment) (patient reports her daughter takes her to appointments)   Readmission Risk Interventions No flowsheet data found.

## 2020-09-06 NOTE — Discharge Summary (Signed)
Physician Discharge Summary   [name]  female DOB: 13-Apr-1940  TKP:[id number]  PCP: [name], MD  Admit date: 08/27/2020 Discharge date: 09/06/2020  Admitted From: home Disposition:  home Home Health: Yes CODE STATUS: Full code  Discharge Instructions    Discharge instructions   Complete by: As directed    Your cardiologist have prescribed you 2 new medicine for your pulmonary hypertension, Opsumit and Cialis.  Opsumit will be mailed to you.  Cialis is prescribed to your pharmacy.  At discharge, you need 5-6 liters of oxygen at rest, and about 8 liters of oxygen when you walk.    Please follow up with cardiology.   Dr. Enzo Bi [facility] Course:  For full details, please see H&P, progress notes, consult notes and ancillary notes.  Briefly,  [name] is an 80 y.o. female with medical history significant ofCOPD, HTN, Hypothyroidism. She is followed by [name] for pulmonary with last OV 08/15/20. She has been having increasing SOB with increasing need for Oxygen now up to 5 L [place].   Due to increasing symptoms she contacted her doctor who called in Augmentin and started her on oral prednisone. She continued to be short of breath with progressive tachypnea and sats of 75% on 4L. EMS activated. She was put to 15L NRB and transported to [facility] for evaluation. On echo was found to have worsening pulmonary HTN,  Card/pulm consult appreciated.  S/p cath on 5/23.  Opsumit started.   Acute on chronic hypoxic respiratory failure Worsening pulmonary HTN  -Follows with [name] for pulmonary.  -CXR clear, CTA chest w/o infiltrates -procalcitonin negative so abx d/c'ed --Pt was started on tadalafil and Opsumit.  Prior auth for Opsumit initiated by Heart Failure team and approved, and medicine was scheduled to be mailed to pt on 5/31.  Per card, it's acceptable for pt to be without Opsumit for a few days.  Prior to discharge, pt's O2 requirement  was weaned down to 5-6L at rest and 8L with walking and exertion.  Oxygen concentrator was ordered and provided to pt at discharge.  COPD with acute exacerbation Chronic hypoxic respiratory failure on home O2 Pt completed a course of steroid burst and taper. --cont home inhaler and nebs at discharge.   HTN  --cont amlodipine and Toprol --Home Lisinopril held pending cardiology followup due to low BP.  Hypothyroidism --cont Synthroid  GERD:  --cont PPI   Discharge Diagnoses:  Principal Problem:   Acute on chronic respiratory failure with hypoxia (HCC) Active Problems:   Essential hypertension   Hypothyroidism   Pulmonary hypertension (HCC)   COPD with acute exacerbation ([place])   30 Day Unplanned Readmission Risk Score   Flowsheet Row ED to Hosp-Admission (Current) from 08/27/2020 in [place] PCU  30 Day Unplanned Readmission Risk Score (%) 25.21 Filed at 09/06/2020 0800     This score is the patient's risk of an unplanned readmission within 30 days of being discharged (0 -100%). The score is based on dignosis, age, lab data, medications, orders, and past utilization.   Low:  0-14.9   Medium: 15-21.9   High: 22-29.9   Extreme: 30 and above        Discharge Instructions:  Allergies as of 09/06/2020      Reactions   Meclizine Nausea And Vomiting      Medication List    STOP taking these medications   amoxicillin-clavulanate 500-125  MG tablet Commonly known as: Augmentin   diazepam 2 MG tablet Commonly known as: VALIUM   diclofenac Sodium 1 % Gel Commonly known as: Voltaren   predniSONE 20 MG tablet Commonly known as: DELTASONE     TAKE these medications   acetaminophen 325 MG tablet Commonly known as: TYLENOL Take 650 mg every 6 (six) hours as needed by mouth for mild pain or headache.   Advair HFA 115-21 MCG/ACT inhaler Generic drug: fluticasone-salmeterol Inhale 2 puffs into the lungs 2 (two) times daily.   albuterol (2.5  MG/3ML) 0.083% nebulizer solution Commonly known as: PROVENTIL Take 3 mLs (2.5 mg total) by nebulization every 6 (six) hours as needed for wheezing or shortness of breath. What changed: Another medication with the same name was changed. Make sure you understand how and when to take each.   albuterol 108 (90 Base) MCG/ACT inhaler Commonly known as: VENTOLIN HFA TAKE 2 PUFFS BY MOUTH EVERY 6 HOURS AS NEEDED FOR WHEEZE OR SHORTNESS OF BREATH What changed:   how much to take  how to take this  when to take this  reasons to take this  additional instructions   Alive [employer] Tabs Take 1 tablet by mouth daily.   amLODipine 5 MG tablet Commonly known as: NORVASC TAKE 1 TABLET BY MOUTH EVERY DAY   arformoterol 15 MCG/2ML Nebu Commonly known as: BROVANA Take 2 mLs (15 mcg total) by nebulization 2 (two) times daily.   aspirin 81 MG chewable tablet Chew 81 mg by mouth daily.   budesonide 0.5 MG/2ML nebulizer solution Commonly known as: Pulmicort Take 2 mLs (0.5 mg total) by nebulization in the morning and at bedtime.   calcium-vitamin D 500-200 MG-UNIT tablet Commonly known as: OSCAL WITH D Take 1 tablet by mouth daily with breakfast.   cetirizine 10 MG tablet Commonly known as: ZYRTEC Take 10 mg daily as needed by mouth for allergies.   clopidogrel 75 MG tablet Commonly known as: PLAVIX TAKE 1 TABLET BY MOUTH EVERY DAY   docusate sodium 100 MG capsule Commonly known as: [employer] Stool Softener TAKE 1 CAPSULE (100 MG TOTAL) BY MOUTH DAILY AS NEEDED FOR MILD CONSTIPATION. What changed:   how much to take  how to take this  when to take this  reasons to take this   fluticasone 50 MCG/ACT nasal spray Commonly known as: FLONASE Place 1 spray into both nostrils daily as needed for allergies or rhinitis (for sinus congestion).   gabapentin 100 MG capsule Commonly known as: NEURONTIN TAKE 1 CAPSULE (100 MG TOTAL) BY MOUTH AT BEDTIME.   guaiFENesin-dextromethorphan  100-10 MG/5ML syrup Commonly known as: ROBITUSSIN DM Take 10 mLs by mouth every 4 (four) hours as needed for cough.   latanoprost 0.005 % ophthalmic solution Commonly known as: XALATAN Place 1 drop at bedtime into both eyes.   levothyroxine 50 MCG tablet Commonly known as: SYNTHROID Take 1.5 tablets (75 mcg total) by mouth daily.   lisinopril 20 MG tablet Commonly known as: ZESTRIL Hold until followup with cardiology due to low blood pressure. What changed:   how much to take  how to take this  when to take this  additional instructions   macitentan 10 MG tablet Commonly known as: OPSUMIT Take 1 tablet (10 mg total) by mouth daily. This medicine will be mailed to your house. Start taking on: Sep 07, 2020   metoprolol succinate 25 MG 24 hr tablet Commonly known as: TOPROL-XL TAKE 1 TABLET BY MOUTH EVERY DAY  PROBIOTIC PO Take 1 capsule by mouth daily.   simvastatin 20 MG tablet Commonly known as: ZOCOR TAKE 1 TABLET BY MOUTH EVERYDAY AT BEDTIME What changed: See the new instructions.   tadalafil 20 MG tablet Commonly known as: CIALIS Take 1 tablet (20 mg total) by mouth daily. Start taking on: Sep 07, 2020            Durable Medical Equipment  (From admission, onward)         Start     Ordered   09/05/20 1604  For home use only DME oxygen  Once       Question Answer Comment  Length of Need 6 Months   Mode or (Route) Nasal cannula   Liters per Minute 8   Frequency Continuous (stationary and portable oxygen unit needed)   Oxygen delivery system Gas      09/05/20 1604           Follow-up Information    [facility] [place] Follow up.   Specialty: Home Health Services Why: for home health services  Contact information: [place] [address] [phone]        [facility] Follow up.   Specialty: Cardiology Why: September 25, 2020 at 9:30 AM  [facility], [place] Code 4233 Contact information: [address] [id number] [place]       [name], MD. Schedule an appointment as soon as possible for a visit in 1 week(s).   Specialty: Internal Medicine Contact information: [address] [place] [address] [phone]               Allergies  Allergen Reactions  . Meclizine Nausea And Vomiting     The results of significant diagnostics from this hospitalization (including imaging, microbiology, ancillary and laboratory) are listed below for reference.   Consultations:   Procedures/Studies: CT Angio Chest PE W/Cm &/Or Wo Cm  Result Date: 08/27/2020 CLINICAL DATA:  Shortness of breath and hypoxia EXAM: CT ANGIOGRAPHY CHEST WITH CONTRAST TECHNIQUE: Multidetector CT imaging of the chest was performed using the standard protocol during bolus administration of intravenous contrast. Multiplanar CT image reconstructions and MIPs were obtained to evaluate the vascular anatomy. CONTRAST:  51m OMNIPAQUE IOHEXOL 350 MG/ML SOLN COMPARISON:  05/01/2020, chest x-ray from 08/27/2020 FINDINGS: Cardiovascular: Thoracic aorta and its branches demonstrate atherosclerotic calcification. No aneurysmal dilatation or focal dissection is seen although the descending thoracic aorta is limited in the degree of opacification. No cardiac enlargement is noted. Coronary calcifications are seen. The pulmonary artery is well visualized within normal branching pattern. No large central pulmonary embolism is seen. The peripheral branches are somewhat limited in the lower lobes due to motion artifact. No discrete embolus is seen. Mediastinum/Nodes: Thoracic inlet is within normal limits. Scattered hilar and mediastinal lymph nodes are seen. These measure up to 10 mm in short axis and likely reactive in nature. The esophagus as visualized is within normal  limits. Lungs/Pleura: Emphysematous changes of lungs are noted. No focal infiltrate or sizable effusion is noted. Mild right basilar atelectasis is seen. No sizable parenchymal nodules are noted. Upper Abdomen: Visualized upper abdomen is within normal limits. Musculoskeletal: Degenerative changes of the thoracic spine are noted. Review of the MIP images confirms the above findings. IMPRESSION: No definitive pulmonary emboli. The lower lobe branches are somewhat limited due to patient  motion artifact and incomplete opacification. Scattered hilar and mediastinal lymph nodes measuring up to 10 mm in short axis. These are likely reactive in nature. Possibility of underlying process such as sarcoidosis would deserve consideration as well. These are stable from the prior CT examination. Mild emphysematous changes with mild right basilar atelectasis. Aortic Atherosclerosis (ICD10-I70.0) and Emphysema (ICD10-J43.9). Electronically Signed   By: Inez Catalina M.D.   On: 08/27/2020 18:01   CARDIAC CATHETERIZATION  Result Date: 09/01/2020 1. Low filling pressures. 2. Severe pulmonary arterial hypertension.   CT Chest High Resolution  Result Date: 08/30/2020 CLINICAL DATA:  Inpatient. Suspected sarcoidosis. Dyspnea and hypoxia on recent chest CT report. EXAM: CT CHEST WITHOUT CONTRAST TECHNIQUE: Multidetector CT imaging of the chest was performed following the standard protocol without intravenous contrast. High resolution imaging of the lungs, as well as inspiratory and expiratory imaging, was performed. COMPARISON:  08/27/2020 chest CT angiogram. FINDINGS: Cardiovascular: Normal heart size. No significant pericardial effusion/thickening. Three-vessel coronary atherosclerosis. Right brachiocephalic artery stent. Atherosclerotic nonaneurysmal thoracic aorta. Top-normal caliber main pulmonary artery (3.2 cm diameter). Mediastinum/Nodes: No discrete thyroid nodules. Unremarkable esophagus. No axillary adenopathy. Mildly  enlarged 1.0 cm short axis diameter right paratracheal node (series 5/image 54), slightly decreased from 1.2 cm on 08/27/2020 chest CT. Mildly enlarged 1.0 cm left paratracheal node (series 5/image 55), mildly decreased from 1.2 cm. Previously mildly enlarged subcarinal node is decreased and no longer enlarged. No new pathologically enlarged mediastinal nodes. No discrete hilar adenopathy on these noncontrast images. Lungs/Pleura: No pneumothorax. No pleural effusion. Subpleural anterior right upper lobe solid 0.5 cm pulmonary nodule (series 6/image 30), unchanged since 05/01/2020 chest CT. Separate anterior right upper lobe 0.4 cm solid pulmonary nodule (series 6/image 42), decreased from 0.6 cm on 05/01/2020 chest CT. No acute consolidative airspace disease, lung masses or new significant pulmonary nodules. Small parenchymal bands in the dependent lower lobes bilaterally are unchanged. Mild patchy subpleural reticulation in both lungs is unchanged. No significant regions of traction bronchiectasis, architectural distortion or frank honeycombing. No significant lobular air trapping or evidence of tracheobronchomalacia on the expiration sequence. Mild centrilobular emphysema with diffuse bronchial wall thickening. These findings are not appreciably changed since 05/01/2020 chest CT. Upper abdomen: No acute abnormality. Coarsely calcified nonenlarged porta hepatis node is unchanged. Musculoskeletal: No aggressive appearing focal osseous lesions. Marked thoracic spondylosis. IMPRESSION: 1. Mild noncalcified mediastinal lymphadenopathy is mildly decreased since 08/27/2020 chest CT, nonspecific, likely benign reactive adenopathy. 2. No compelling findings of interstitial lung disease. Mild patchy subpleural reticulation in both lungs and parenchymal bands in the dependent lower lobes is unchanged and nonspecific, favoring mild nonspecific postinfectious/postinflammatory scarring. 3. Mild centrilobular emphysema with  diffuse bronchial wall thickening, suggesting COPD. 4. Small right upper lobe solid pulmonary nodules are stable to decreased since 05/01/2020 chest CT, more likely benign. Follow-up noncontrast chest CT recommended in 12-18 months. 5. Aortic Atherosclerosis (ICD10-I70.0) and Emphysema (ICD10-J43.9). Electronically Signed   By: Ilona Sorrel M.D.   On: 08/30/2020 20:06   DG Chest Port 1 View  Result Date: 08/27/2020 CLINICAL DATA:  Shortness of breath. EXAM: PORTABLE CHEST 1 VIEW COMPARISON:  July 09, 2020 FINDINGS: Chronic appearing increased interstitial lung markings are noted. Mild atelectasis is seen within the bilateral lung bases. There is no evidence of a pleural effusion or pneumothorax. The heart size and mediastinal contours are within normal limits. A radiopaque stent is seen along the superior mediastinum on the right. The visualized skeletal structures are unremarkable. IMPRESSION: No acute or active cardiopulmonary disease. Electronically  Signed   By: Virgina Norfolk M.D.   On: 08/27/2020 15:44   ECHOCARDIOGRAM COMPLETE  Result Date: 08/29/2020    ECHOCARDIOGRAM REPORT   Patient Name:   [name] Date of Exam: 08/29/2020 Medical Rec #:  [id number]       Height:       62.0 in Accession #:    [id number]      Weight:       129.6 lb Date of Birth:  08/21/1940       BSA:          1.590 m Patient Age:    26 years        BP:           127/66 mmHg Patient Gender: F               HR:           84 bpm. Exam Location:  Inpatient Procedure: 2D Echo, Cardiac Doppler, Color Doppler and Intracardiac            Opacification Agent Indications:    Dyspnea  History:        Patient has prior history of Echocardiogram examinations, most                 recent 12/26/2019. COPD, Signs/Symptoms:Shortness of Breath; Risk                 Factors:Hypertension.  Sonographer:    Clayton Lefort RDCS (AE) Referring Phys: [place]  1. Left ventricular ejection fraction, by estimation, is 60 to 65%.  The left ventricle has normal function. The left ventricle has no regional wall motion abnormalities. There is mild concentric left ventricular hypertrophy. Left ventricular diastolic parameters are consistent with Grade I diastolic dysfunction (impaired relaxation).  2. Right ventricular systolic function is mildly reduced. The right ventricular size is moderately enlarged. There is severely elevated pulmonary artery systolic pressure. The estimated right ventricular systolic pressure is 84.6 mmHg.  3. The mitral valve is normal in structure. No evidence of mitral valve regurgitation. No evidence of mitral stenosis.  4. The aortic valve is tricuspid. There is mild calcification of the aortic valve. There is mild thickening of the aortic valve. Aortic valve regurgitation is not visualized. Mild aortic valve sclerosis is present, with no evidence of aortic valve stenosis. Comparison(s): Compared to prior TTE in 12/2019, there is severe pulmonary HTN with PASP 49mHg. FINDINGS  Left Ventricle: Left ventricular ejection fraction, by estimation, is 60 to 65%. The left ventricle has normal function. The left ventricle has no regional wall motion abnormalities. Definity contrast agent was given IV to delineate the left ventricular  endocardial borders. The left ventricular internal cavity size was normal in size. There is mild concentric left ventricular hypertrophy. Left ventricular diastolic parameters are consistent with Grade I diastolic dysfunction (impaired relaxation). Right Ventricle: The right ventricular size is moderately enlarged. Right vetricular wall thickness was not well visualized. Right ventricular systolic function is mildly reduced. There is severely elevated pulmonary artery systolic pressure. The tricuspid regurgitant velocity is 4.04 m/s, and with an assumed right atrial pressure of 8 mmHg, the estimated right ventricular systolic pressure is 796.2mmHg. Left Atrium: Left atrial size was normal in  size. Right Atrium: Right atrial size was normal in size. Pericardium: There is no evidence of pericardial effusion. Mitral Valve: The mitral valve is normal in structure. There is mild thickening of the mitral valve leaflet(s). There is mild  calcification of the mitral valve leaflet(s). No evidence of mitral valve regurgitation. No evidence of mitral valve stenosis. MV peak gradient, 2.6 mmHg. The mean mitral valve gradient is 1.0 mmHg. Tricuspid Valve: The tricuspid valve is normal in structure. Tricuspid valve regurgitation is trivial. Aortic Valve: The aortic valve is tricuspid. There is mild calcification of the aortic valve. There is mild thickening of the aortic valve. Aortic valve regurgitation is not visualized. Mild aortic valve sclerosis is present, with no evidence of aortic valve stenosis. Aortic valve mean gradient measures 2.0 mmHg. Aortic valve peak gradient measures 4.2 mmHg. Aortic valve area, by VTI measures 2.19 cm. Pulmonic Valve: The pulmonic valve was normal in structure. Pulmonic valve regurgitation is trivial. Aorta: The aortic root and ascending aorta are structurally normal, with no evidence of dilitation. IAS/Shunts: No atrial level shunt detected by color flow Doppler.  LEFT VENTRICLE PLAX 2D LVIDd:         4.10 cm  Diastology LVIDs:         2.90 cm  LV e' medial:    7.29 cm/s LV PW:         1.30 cm  LV E/e' medial:  8.2 LV IVS:        1.40 cm  LV e' lateral:   8.92 cm/s LVOT diam:     1.90 cm  LV E/e' lateral: 6.7 LV SV:         42 LV SV Index:   26 LVOT Area:     2.84 cm  RIGHT VENTRICLE            IVC RV Basal diam:  3.00 cm    IVC diam: 2.20 cm RV S prime:     8.70 cm/s TAPSE (M-mode): 1.3 cm LEFT ATRIUM             Index       RIGHT ATRIUM           Index LA diam:        2.00 cm 1.26 cm/m  RA Area:     13.60 cm LA Vol (A2C):   30.1 ml 18.93 ml/m RA Volume:   33.70 ml  21.20 ml/m LA Vol (A4C):   21.4 ml 13.46 ml/m LA Biplane Vol: 25.5 ml 16.04 ml/m  AORTIC VALVE AV Area  (Vmax):    2.03 cm AV Area (Vmean):   2.19 cm AV Area (VTI):     2.19 cm AV Vmax:           103.00 cm/s AV Vmean:          74.100 cm/s AV VTI:            0.192 m AV Peak Grad:      4.2 mmHg AV Mean Grad:      2.0 mmHg LVOT Vmax:         73.80 cm/s LVOT Vmean:        57.300 cm/s LVOT VTI:          0.148 m LVOT/AV VTI ratio: 0.77  AORTA Ao Root diam: 3.40 cm Ao Asc diam:  3.10 cm MITRAL VALVE               TRICUSPID VALVE MV Area (PHT): 2.76 cm    TR Peak grad:   65.3 mmHg MV Area VTI:   2.27 cm    TR Vmax:        404.00 cm/s MV Peak grad:  2.6 mmHg MV Mean grad:  1.0  mmHg    SHUNTS MV Vmax:       0.81 m/s    Systemic VTI:  0.15 m MV Vmean:      48.1 cm/s   Systemic Diam: 1.90 cm MV Decel Time: 275 msec MV E velocity: 59.55 cm/s MV A velocity: 68.60 cm/s MV E/A ratio:  0.87 Gwyndolyn Kaufman MD Electronically signed by Gwyndolyn Kaufman MD Signature Date/Time: 08/29/2020/5:44:55 PM    Final       Labs: BNP (last 3 results) Recent Labs    05/01/20 1232 08/28/20 1406 08/30/20 0936  BNP 80.9 218.6* 025.8*   Basic Metabolic Panel: Recent Labs  Lab 09/01/20 0139 09/01/20 0831 09/04/20 0137 09/05/20 0106 09/06/20 0038  NA 136 143  142 136 135 135  K 4.4 3.7  3.7 4.6 4.0 3.9  CL 109  --  109 106 108  CO2 20*  --  19* 21* 21*  GLUCOSE 93  --  91 128* 139*  BUN 28*  --  29* 24* 30*  CREATININE 0.80  --  0.94 0.82 0.91  CALCIUM 9.2  --  8.8* 9.4 9.0  MG  --   --  2.1 2.3 2.2   Liver Function Tests: Recent Labs  Lab 09/01/20 0139  AST 26  ALT 24  ALKPHOS 61  BILITOT 0.9  PROT 7.1  ALBUMIN 3.3*   No results for input(s): LIPASE, AMYLASE in the last 168 hours. No results for input(s): AMMONIA in the last 168 hours. CBC: Recent Labs  Lab 09/01/20 0139 09/01/20 0831 09/04/20 0404 09/05/20 0106 09/06/20 0038  WBC 14.2*  --  15.3* 16.4* 16.5*  HGB 13.6 14.3  14.3 13.1 13.9 13.0  HCT 41.4 42.0  42.0 40.4 41.0 38.9  MCV 93.7  --  94.0 91.5 93.5  PLT 269  --  258 267 225    Cardiac Enzymes: No results for input(s): CKTOTAL, CKMB, CKMBINDEX, TROPONINI in the last 168 hours. BNP: Invalid input(s): POCBNP CBG: No results for input(s): GLUCAP in the last 168 hours. D-Dimer No results for input(s): DDIMER in the last 72 hours. Hgb A1c No results for input(s): HGBA1C in the last 72 hours. Lipid Profile No results for input(s): CHOL, HDL, LDLCALC, TRIG, CHOLHDL, LDLDIRECT in the last 72 hours. Thyroid function studies No results for input(s): TSH, T4TOTAL, T3FREE, THYROIDAB in the last 72 hours.  Invalid input(s): FREET3 Anemia work up No results for input(s): VITAMINB12, FOLATE, FERRITIN, TIBC, IRON, RETICCTPCT in the last 72 hours. Urinalysis    Component Value Date/Time   COLORURINE YELLOW 09/23/2015 0012   APPEARANCEUR Clear 01/15/2020 1225   LABSPEC 1.015 11/25/2019 1137   PHURINE 6.5 11/25/2019 1137   GLUCOSEU Negative 01/15/2020 1225   HGBUR NEGATIVE 11/25/2019 1137   BILIRUBINUR Negative 01/15/2020 1225   KETONESUR NEGATIVE 11/25/2019 1137   PROTEINUR Negative 01/15/2020 1225   PROTEINUR NEGATIVE 11/25/2019 1137   UROBILINOGEN 0.2 11/25/2019 1137   NITRITE Negative 01/15/2020 1225   NITRITE NEGATIVE 11/25/2019 1137   LEUKOCYTESUR Negative 01/15/2020 1225   LEUKOCYTESUR NEGATIVE 11/25/2019 1137   Sepsis Labs Invalid input(s): PROCALCITONIN,  WBC,  LACTICIDVEN Microbiology Recent Results (from the past 240 hour(s))  Resp Panel by RT-PCR (Flu A&B, Covid) Nasopharyngeal Swab     Status: None   Collection Time: 08/27/20  3:03 PM   Specimen: Nasopharyngeal Swab; Nasopharyngeal(NP) swabs in vial transport medium  Result Value Ref Range Status   SARS Coronavirus 2 by RT PCR NEGATIVE NEGATIVE Final    Comment: (NOTE)  SARS-CoV-2 target nucleic acids are NOT DETECTED.  The SARS-CoV-2 RNA is generally detectable in upper respiratory specimens during the acute phase of infection. The lowest concentration of SARS-CoV-2 viral copies this assay  can detect is 138 copies/mL. A negative result does not preclude SARS-Cov-2 infection and should not be used as the sole basis for treatment or other patient management decisions. A negative result may occur with  improper specimen collection/handling, submission of specimen other than nasopharyngeal swab, presence of viral mutation(s) within the areas targeted by this assay, and inadequate number of viral copies(<138 copies/mL). A negative result must be combined with clinical observations, patient history, and epidemiological information. The expected result is Negative.  Fact Sheet for Patients:  EntrepreneurPulse.com.au  Fact Sheet for Healthcare Providers:  IncredibleEmployment.be  This test is no t yet approved or cleared by the [place] FDA and  has been authorized for detection and/or diagnosis of SARS-CoV-2 by FDA under an Emergency Use Authorization (EUA). This EUA will remain  in effect (meaning this test can be used) for the duration of the COVID-19 declaration under Section 564(b)(1) of the Act, 21 U.S.C.section 360bbb-3(b)(1), unless the authorization is terminated  or revoked sooner.       Influenza A by PCR NEGATIVE NEGATIVE Final   Influenza B by PCR NEGATIVE NEGATIVE Final    Comment: (NOTE) The Xpert Xpress SARS-CoV-2/FLU/RSV plus assay is intended as an aid in the diagnosis of influenza from Nasopharyngeal swab specimens and should not be used as a sole basis for treatment. Nasal washings and aspirates are unacceptable for Xpert Xpress SARS-CoV-2/FLU/RSV testing.  Fact Sheet for Patients: EntrepreneurPulse.com.au  Fact Sheet for Healthcare Providers: IncredibleEmployment.be  This test is not yet approved or cleared by the [place] FDA and has been authorized for detection and/or diagnosis of SARS-CoV-2 by FDA under an Emergency Use Authorization (EUA). This EUA will  remain in effect (meaning this test can be used) for the duration of the COVID-19 declaration under Section 564(b)(1) of the Act, 21 U.S.C. section 360bbb-3(b)(1), unless the authorization is terminated or revoked.  Performed at [facility], [place] [address], [place], [place] [address]   MRSA PCR Screening     Status: Abnormal   Collection Time: 09/01/20  5:49 AM   Specimen: Nasal Mucosa; Nasopharyngeal  Result Value Ref Range Status   MRSA by PCR POSITIVE (A) NEGATIVE Final    Comment:        The GeneXpert MRSA Assay (FDA approved for NASAL specimens only), is one component of a comprehensive MRSA colonization surveillance program. It is not intended to diagnose MRSA infection nor to guide or monitor treatment for MRSA infections. RESULT CALLED TO, READ BACK BY AND VERIFIED WITH: RN K.HELL AT 0800 ON 09/01/2020 BY T.SAAD. Performed at [facility], [place] [address], [place], [place] [address]      Total time spend on discharging this patient, including the last patient exam, discussing the hospital stay, instructions for ongoing care as it relates to all pertinent caregivers, as well as preparing the medical discharge records, prescriptions, and/or referrals as applicable, is 40 minutes.    Enzo Bi, MD  [facility] 09/06/2020, 10:29 AM

## 2020-09-09 ENCOUNTER — Telehealth: Payer: Self-pay

## 2020-09-09 ENCOUNTER — Telehealth: Payer: Self-pay | Admitting: Pulmonary Disease

## 2020-09-09 NOTE — Telephone Encounter (Signed)
Albuterol inhaler was sent 08/21/20 with 2 RF. It is too early to refill. In regards to the tadalafil, I believe this patient has [place] Medicaid. Medicaid will cover generic tadalafil or sildenafil in the setting of PAH. However, they may likely require a PA for [place] to prove this is what we are using it for. I have included Kelly on this thread.   Claiborne Billings,   Can we initiate a PA for patient's tadalafil? We are treating pulmonary arterial hypertension.

## 2020-09-09 NOTE — Telephone Encounter (Signed)
Opal Sidles,  How would we usually handle this? The patient has Medicaid and Medicare.

## 2020-09-09 NOTE — Telephone Encounter (Signed)
Transition Care Management Follow-up Telephone Call Information collected from patient's daughter, [name] who came to the clinic this morning  Date of discharge and from where: 09/06/2020, [facility]   How have you been since you were released from the hospital? [name] said that her mother is doing well.   Any questions or concerns? Yes   She wanted to know status of PCS referral because she has not heard from [employer]. - This CM called [employer], spoke to [place] who said that the patient was approved for services on 08/20/2020 and they are just waiting for patient to choose an agency to provide the services. This CM called [name] and provided this update.  [name] is more interested in CAP services but this CM explained to her that CAP services can take months to be approved so if they want assistance in the meantime, she should contact [employer]. Provided her with the number for [place].   She said she dropped off CAP paperwork for Dr Wynetta Emery. Informed her that Dr Wynetta Emery will be out of the office until 09/11/2020 and this CM can check on the status at that time. [name] said that her sister plans to care for her mother through CAP services if approved  She was also interested in the status of the order for the manual w/c. As per Elinor Parkinson, CMA this was not -resent to another agency after [facility] noted that they did not have any wheelchairs. The order has since been faxed to [facility] and this information was shared with [name]   She has a nebulizer and has contacted the pulmonologist office about refills of the albuterol neb solution.   She is requesting an albuterol inhaler refill from Dr Wynetta Emery stating that Dr Wynetta Emery has ordered this in the past.  [name] dropped off paperwork for Dr Wynetta Emery to complete for [facility].  She said that she has until 6/15-6/16/2022 to return the completed document.   Cialis- she was given a 30 day card to get this medication but  her insurance company will not cover it going forward. She is asking if pharmacy is able to assist with this or if there is there an alternative.    They are waiting for delivery of Opsumit.   Items Reviewed:  Did the pt receive and understand the discharge instructions provided? Yes   Medications obtained and verified? Yes  - she has all medications except those noted above.  She did not have any other questions about the meds.   Other? No   Any new allergies since your discharge? No   Do you have support at home? Yes , her daughters.   Home Care and Equipment/Supplies: Were home health services ordered? yes If so, what is the name of the agency? [facility]  Has the agency set up a time to come to the patient's home? Not sure Were any new equipment or medical supplies ordered?  No What is the name of the medical supply agency? n/a Were you able to get the supplies/equipment? not applicable Do you have any questions related to the use of the equipment or supplies? No   She has a nebulizer Has home O2, has been using at 5L when resting an will increase to 8L/min with activity.   Functional Questionnaire: (I = Independent and D = Dependent) ADLs: independent with her daughters providing any needed assistance.  She has been ambulating without an assistive device   Follow up appointments reviewed:   PCP Hospital f/u appt  confirmed? Yes  - Dr Wynetta Emery 09/12/2020 @ 1050. *.  [facility] f/u appt confirmed? Yes  - cardiology - 09/25/2020 ; pulmonary  - 09/29/2020.    Are transportation arrangements needed? No   If their condition worsens, is the pt aware to call PCP or go to the Emergency Dept.? Yes  Was the patient provided with contact information for the PCP's office or ED? Yes  Was to pt encouraged to call back with questions or concerns? Yes

## 2020-09-09 NOTE — Telephone Encounter (Signed)
I called and spoke with daughter [name] who is on DPR in regards to message. They are wondering about the nebs meds and diagnosis code. Looks like Donah Driver was handling this with [facility]. Looks like the office was not able to give a DX code where medicare would cover it. I informed daughter that we could try another pharmacy and the daughter requested we ask to get into contact with [facility] nurse, Eden Lathe, to see if they can help. Will route to Dr. Jenetta Downer for recs.  [name], please advise. Thanks!

## 2020-09-09 NOTE — Telephone Encounter (Signed)
I have spoken to patient's daughter, [name], about this today and it has been documented

## 2020-09-10 ENCOUNTER — Other Ambulatory Visit: Payer: Self-pay

## 2020-09-10 ENCOUNTER — Telehealth: Payer: Self-pay

## 2020-09-10 ENCOUNTER — Telehealth: Payer: Self-pay | Admitting: Internal Medicine

## 2020-09-10 NOTE — Telephone Encounter (Signed)
Call placed to patient's daughter, [name]. She said that the nurse from [facility] came out today.   She has not called [employer] regarding PCS but will call tomorrow.    Provided her with the phone number for [facility] to check on status of wheelchair.  Explained to her that her mother has a prescription for albuterol inhaler with refills at [facility] and also explained that [facility] has submitted a request for prior authorization for tadalafil.

## 2020-09-10 NOTE — Telephone Encounter (Signed)
Okay to make contact and try and figure out what we can do on our end  She has been on nebulization treatments for wheezing and it seems to help her

## 2020-09-10 NOTE — Telephone Encounter (Signed)
Called to speak with the doctor or nurse regarding the dosage of patient's medication for tadalafil (CIALIS) 20 MG tablet.  Please call to discuss at [phone]

## 2020-09-10 NOTE — Telephone Encounter (Signed)
Submitted PA to patient's Medicare Part D plan, waiting on response.

## 2020-09-11 ENCOUNTER — Telehealth: Payer: Self-pay | Admitting: Pulmonary Disease

## 2020-09-11 NOTE — Telephone Encounter (Signed)
I have sent a community message to [facility] with [employer] about the wheelchair order.

## 2020-09-11 NOTE — Telephone Encounter (Signed)
AO please advise on the following:  We are missing the narrative within the office visit notes that explains the need for the wheelchair. Can the office visit notes be amended or a "televist" performed so that piece of information can be discussed and added. After we receive that we can move forward with dispensing the WC.

## 2020-09-12 ENCOUNTER — Other Ambulatory Visit: Payer: Self-pay

## 2020-09-12 ENCOUNTER — Encounter: Payer: Self-pay | Admitting: Internal Medicine

## 2020-09-12 ENCOUNTER — Ambulatory Visit: Payer: Medicare Other | Attending: Internal Medicine | Admitting: Internal Medicine

## 2020-09-12 VITALS — BP 123/56 | HR 97 | Resp 16

## 2020-09-12 DIAGNOSIS — Z09 Encounter for follow-up examination after completed treatment for conditions other than malignant neoplasm: Secondary | ICD-10-CM | POA: Diagnosis not present

## 2020-09-12 DIAGNOSIS — Z9981 Dependence on supplemental oxygen: Secondary | ICD-10-CM | POA: Diagnosis not present

## 2020-09-12 DIAGNOSIS — J9611 Chronic respiratory failure with hypoxia: Secondary | ICD-10-CM

## 2020-09-12 DIAGNOSIS — Z7189 Other specified counseling: Secondary | ICD-10-CM

## 2020-09-12 DIAGNOSIS — I272 Pulmonary hypertension, unspecified: Secondary | ICD-10-CM | POA: Diagnosis not present

## 2020-09-12 NOTE — Telephone Encounter (Signed)
Returned call to [facility] and was unable to reach anyone  Per directions  Take 1 tablet (20 mg total) by mouth daily.

## 2020-09-12 NOTE — Progress Notes (Signed)
Pt daughter has some FMLA paperwork to get filled out

## 2020-09-12 NOTE — Progress Notes (Addendum)
Patient ID: [name], female    DOB: 1940-08-19  MRN: [id number]  CC: Transition of care Date of hospitalization: 5/18- 28/2022 Date of phone call from case worker: 09/09/2020  Subjective: [name] is a 80 y.o. female who presents for transition of care visit.  Her daughters [name] and [name] are with her. Her concerns today include:  HTN, hypothyroid, HL,chronic hypoxic respiratory failure on 5-8 L of O2 continuous, pulHTN(negative VQ scan, serology negative for CTD, sleep study negative for OSA, PFTsincomplete as pt unable tofollow instructions to complete. 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  Patient hospitalized with acute on chronic hypoxic respiratory failure and worsening pulmonary hypertension.  Underwent RHC on 5/23.  PA pressure 72/13, mean of 36.  Echo revealed EF of 60 to 65%, mild LVH, moderate LV enlargement, mildly decreased RV systolic function.  High-resolution CT of chest no evidence for ILD, some lymphadenopathy and nonspecific fibrotic changes which have not progressed, mild emphysema.  She was seen by pulmonologist Dr. Waunita Schooner who questions whether she has sarcoidosis.  Patient oxygen requirement remained too high for him to do bronchoscopy.   Chest x-ray clear, CTA chest without infiltrates Started on tadalafil and Opsumit.  Prior to discharge, patient's O2 requirement was weaned down to 5 to 6 L at rest and 8 L with ambulation.  Today: Patient reports that her oxygen level has remained between 94 to 96%.  Most of the times during the day she is on 8 L.  She decreases the O2 to 5 L at nights.  Feeling a little better since being on tadalafil and Opsumit but still expressed shortness  of breath with minimal activity.  Able to ambulate only short distances in the house like from her bed to the bathroom or to the kitchen.  Trying to get manual wheelchair. Reports good appetite.  Sleeping okay. She has been using the arformoterol and steroid nebulizer treatments twice a day. -She has upcoming appointments with cardiology and with her pulmonologist later this month. I received a form for the CAP program that her daughter has enrolled her in.  [name] is with her during the day if [name] has to go to work.  Both daughters assist with her care in terms of meal preps and baths.  Patient does not want a stranger in her house giving care.  [name] has brought in FMLA form to be completed.  She has been out of work since 531 and will return on 09/30/2020.  Patient Active Problem List   Diagnosis Date Noted  . COPD with acute exacerbation ([place]) 08/27/2020  . COVID-19 05/01/2020  . Pulmonary hypertension ([place]) 01/15/2020  . Hypoxemic respiratory failure, chronic ([place]) 01/15/2020  . Lung nodule, multiple 01/15/2020  . 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 SOB (shortness of breath) 12/26/2019  . Acute on chronic respiratory failure with hypoxia ([place]) 12/25/2019  . Acute respiratory failure with hypoxemia ([place]) 12/25/2019  . Essential hypertension 06/06/2017  . Hyperlipidemia 06/06/2017  . Leg cramps 06/06/2017  . Vertigo 06/06/2017  . Macular degeneration 06/06/2017  . Hypothyroidism 06/06/2017  . Carotid stenosis 01/22/2014  . Aftercare following surgery of the circulatory system, [place] 01/02/2013  . Occlusion and stenosis of carotid artery without mention of cerebral infarction 04/27/2011     Current Outpatient  Medications on File Prior to Visit  Medication Sig Dispense Refill  . acetaminophen (TYLENOL) 325 MG tablet Take 650 mg every 6 (six) hours as needed by mouth for mild pain or headache.     [name] HFA 656-81 MCG/ACT inhaler Inhale 2 puffs into the lungs 2 (two)  times daily.    Marland Kitchen albuterol (PROVENTIL) (2.5 MG/3ML) 0.083% nebulizer solution Take 3 mLs (2.5 mg total) by nebulization every 6 (six) hours as needed for wheezing or shortness of breath. 360 mL 11  . albuterol (VENTOLIN HFA) 108 (90 Base) MCG/ACT inhaler TAKE 2 PUFFS BY MOUTH EVERY 6 HOURS AS NEEDED FOR WHEEZE OR SHORTNESS OF BREATH (Patient taking differently: Inhale 2 puffs into the lungs every 6 (six) hours as needed for wheezing or shortness of breath.) 18 each 2  . amLODipine (NORVASC) 5 MG tablet TAKE 1 TABLET BY MOUTH EVERY DAY (Patient taking differently: Take 5 mg by mouth daily.) 90 tablet 0  . arformoterol (BROVANA) 15 MCG/2ML NEBU Take 2 mLs (15 mcg total) by nebulization 2 (two) times daily. 120 mL 11  . aspirin 81 MG chewable tablet Chew 81 mg by mouth daily.    . budesonide (PULMICORT) 0.5 MG/2ML nebulizer solution Take 2 mLs (0.5 mg total) by nebulization in the morning and at bedtime. 120 mL 11  . calcium-vitamin D (OSCAL WITH D) 500-200 MG-UNIT per tablet Take 1 tablet by mouth daily with breakfast.    . cetirizine (ZYRTEC) 10 MG tablet Take 10 mg daily as needed by mouth for allergies.     Marland Kitchen clopidogrel (PLAVIX) 75 MG tablet TAKE 1 TABLET BY MOUTH EVERY DAY (Patient taking differently: Take 75 mg by mouth daily.) 30 tablet 0  . docusate sodium ([facility] STOOL SOFTENER) 100 MG capsule TAKE 1 CAPSULE (100 MG TOTAL) BY MOUTH DAILY AS NEEDED FOR MILD CONSTIPATION. (Patient taking differently: Take 100 mg by mouth daily as needed for mild constipation. TAKE 1 CAPSULE (100 MG TOTAL) BY MOUTH DAILY AS NEEDED FOR MILD CONSTIPATION.) 30 capsule 2  . fluticasone (FLONASE) 50 MCG/ACT nasal spray Place 1 spray into both nostrils daily as needed for allergies or rhinitis (for sinus congestion). 48 mL 3  . gabapentin (NEURONTIN) 100 MG capsule TAKE 1 CAPSULE (100 MG TOTAL) BY MOUTH AT BEDTIME. 90 capsule 1  . guaiFENesin-dextromethorphan (ROBITUSSIN DM) 100-10 MG/5ML syrup Take 10 mLs by mouth every 4  (four) hours as needed for cough. 118 mL 0  . latanoprost (XALATAN) 0.005 % ophthalmic solution Place 1 drop at bedtime into both eyes.    Marland Kitchen levothyroxine (SYNTHROID) 50 MCG tablet Take 1.5 tablets (75 mcg total) by mouth daily. 45 tablet 1  . lisinopril (ZESTRIL) 20 MG tablet Hold until followup with cardiology due to low blood pressure. 30 tablet 0  . macitentan (OPSUMIT) 10 MG tablet Take 1 tablet (10 mg total) by mouth daily. This medicine will be mailed to your house.    . metoprolol succinate (TOPROL-XL) 25 MG 24 hr tablet TAKE 1 TABLET BY MOUTH EVERY DAY (Patient taking differently: Take 25 mg by mouth daily.) 30 tablet 1  . Multiple Vitamins-Minerals (ALIVE WOMENS ENERGY) TABS Take 1 tablet by mouth daily.    . Probiotic Product (PROBIOTIC PO) Take 1 capsule by mouth daily.    . simvastatin (ZOCOR) 20 MG tablet TAKE 1 TABLET BY MOUTH EVERYDAY AT BEDTIME (Patient taking differently: Take 20 mg by mouth at bedtime.) 90 tablet 2  . tadalafil (CIALIS) 20 MG tablet Take 1  tablet (20 mg total) by mouth daily. 30 tablet 2   No current facility-administered medications on file prior to visit.    Allergies  Allergen Reactions  . Meclizine Nausea And Vomiting    Social History   Socioeconomic History  . Marital status: Single    Spouse name: Not on file  . Number of children: Not on file  . Years of education: Not on file  . Highest education level: Not on file  Occupational History  . Not on file  Tobacco Use  . Smoking status: Former Smoker    Packs/day: 0.30    Years: 5.00    Pack years: 1.50    Types: Cigarettes    Quit date: 04/12/1980    Years since quitting: 40.4  . Smokeless tobacco: Never Used  Vaping Use  . Vaping Use: Never used  Substance and Sexual Activity  . Alcohol use: No  . Drug use: No  . Sexual activity: Not Currently  Other Topics Concern  . Not on file  Social History Narrative  . Not on file   Social Determinants of Health   Financial Resource  Strain: Low Risk   . Difficulty of Paying Living Expenses: Not very hard  Food Insecurity: No Food Insecurity  . Worried About Charity fundraiser in the Last Year: Never true  . Ran Out of Food in the Last Year: Never true  Transportation Needs: No Transportation Needs  . Lack of Transportation (Medical): No  . Lack of Transportation (Non-Medical): No  Physical Activity: Not on file  Stress: Not on file  Social Connections: Not on file  Intimate Partner Violence: Not on file    Family History  Problem Relation Age of Onset  . Heart failure Mother   . Cancer Father   . Breast cancer Neg Hx     Past Surgical History:  Procedure Laterality Date  . ABDOMINAL HYSTERECTOMY    . AORTIC ARCH ANGIOGRAPHY N/A 03/01/2017   Procedure: AORTIC ARCH ANGIOGRAPHY;  Surgeon: Serafina [name], MD;  Location: [place] [facility];  Service: Cardiovascular;  Laterality: N/A;  . ARCH AORTOGRAM N/A 05/04/2011   Procedure: ARCH AORTOGRAM;  Surgeon: Serafina [name], MD;  Location: [facility] CATH LAB;  Service: Cardiovascular;  Laterality: N/A;  . ARCH AORTOGRAM N/A 07/04/2012   Procedure: ARCH AORTOGRAM;  Surgeon: Serafina [name], MD;  Location: [facility] CATH LAB;  Service: Cardiovascular;  Laterality: N/A;  . CAROTID ANGIOGRAM  July 04, 2012  . CAROTID ANGIOGRAM N/A 05/04/2011   Procedure: CAROTID ANGIOGRAM;  Surgeon: Serafina [name], MD;  Location: [facility] CATH LAB;  Service: Cardiovascular;  Laterality: N/A;  . CAROTID ANGIOGRAM N/A 07/04/2012   Procedure: CAROTID ANGIOGRAM;  Surgeon: Serafina [name], MD;  Location: [facility] CATH LAB;  Service: Cardiovascular;  Laterality: N/A;  . CAROTID STENT INSERTION Right 05/11/2011   Procedure: CAROTID STENT INSERTION;  Surgeon: Serafina [name], MD;  Location: [facility] CATH LAB;  Service: Cardiovascular;  Laterality: Right;  . carotid subclavian anastomosis  05/11/11   Right subclav. artery transected 12/17/09  . CATARACT EXTRACTION     bilateral  . EYE SURGERY    . RIGHT HEART CATH N/A  09/01/2020   Procedure: RIGHT HEART CATH;  Surgeon: Larey Dresser, MD;  Location: [place] [facility];  Service: Cardiovascular;  Laterality: N/A;    ROS: Review of Systems Negative except as stated above  PHYSICAL EXAM: BP (!) 123/56   Pulse 97   Resp 16  SpO2 95%  on 8 liters. Physical Exam  General appearance -alert elderly female who appears chronically ill.  She is wearing her oxygen.  She appears in no acute distress. Mental status - normal mood, behavior, speech, dress, motor activity, and thought processes Neck - supple, no significant adenopathy Chest -breath sounds slightly decreased bilaterally but no crackles or wheezes heard.   Heart - normal rate, regular rhythm, normal S1, S2, no murmurs, rubs, clicks or gallops Extremities -trace lower extremity edema. Patient is able to ambulate 12 feet without assistance but then became dyspneic and has to sit down.  CMP Latest Ref Rng & Units 09/06/2020 09/05/2020 09/04/2020  Glucose 70 - 99 mg/dL 139(H) 128(H) 91  BUN 8 - 23 mg/dL 30(H) 24(H) 29(H)  Creatinine 0.44 - 1.00 mg/dL 0.91 0.82 0.94  Sodium 135 - 145 mmol/L 135 135 136  Potassium 3.5 - 5.1 mmol/L 3.9 4.0 4.6  Chloride 98 - 111 mmol/L 108 106 109  CO2 22 - 32 mmol/L 21(L) 21(L) 19(L)  Calcium 8.9 - 10.3 mg/dL 9.0 9.4 8.8(L)  Total Protein 6.5 - 8.1 g/dL - - -  Total Bilirubin 0.3 - 1.2 mg/dL - - -  Alkaline Phos 38 - 126 U/L - - -  AST 15 - 41 U/L - - -  ALT 0 - 44 U/L - - -   Lipid Panel     Component Value Date/Time   CHOL 145 09/11/2019 0853   TRIG 84 05/01/2020 1232   HDL 49 09/11/2019 0853   CHOLHDL 3.0 09/11/2019 0853   CHOLHDL 3.4 04/17/2008 2240   VLDL 11 04/17/2008 2240   LDLCALC 74 09/11/2019 0853    CBC    Component Value Date/Time   WBC 16.5 (H) 09/06/2020 0038   RBC 4.16 09/06/2020 0038   HGB 13.0 09/06/2020 0038   HGB 13.3 01/15/2020 1217   HCT 38.9 09/06/2020 0038   HCT 41.9 01/15/2020 1217   PLT 225 09/06/2020 0038   PLT 224  01/15/2020 1217   MCV 93.5 09/06/2020 0038   MCV 94 01/15/2020 1217   MCH 31.3 09/06/2020 0038   MCHC 33.4 09/06/2020 0038   RDW 13.9 09/06/2020 0038   RDW 14.5 01/15/2020 1217   LYMPHSABS 1.8 08/27/2020 1451   MONOABS 0.1 08/27/2020 1451   EOSABS 0.0 08/27/2020 1451   BASOSABS 0.1 08/27/2020 1451    ASSESSMENT AND PLAN:  1. Hospital discharge follow-up   2. Hypoxemic respiratory failure, chronic ([place]) 3. Pulmonary hypertension ([place]) Patient will keep follow-up appointment with pulmonary and cardiology.  Dr. Lake Bells raised the question of possible sarcoid.  Her pulmonologist would need to decide if and when bronchoscopy can be done for biopsy Continue O2 continuous use.  She seems to be requiring 8 L most of the times. Continue Cialis and Opsumit Continued nebulizer treatments Will submit completed form to the CAP program.  We will complete FMLA for her daughter.  We will submit prescription for manual wheelchair.  One of her daughters is always with her to propel her in the wheelchair.  She needs to the wheelchair due to significant dyspnea with minimal activity.   4. Oxygen dependent   5. Advance directive discussed with patient I spoke with the patient and her daughters about advanced directive.  Given the severity of her condition.  She may or may not improve with medications.  Went over the components of an advanced directive including living will and healthcare power of attorney.  Encouraged them to discuss these  issues.  It is much better for these decisions to be made in advance than in an emergency.  Given a packet on information about advanced directive.    Patient was given the opportunity to ask questions.  Patient verbalized understanding of the plan and was able to repeat key elements of the plan.   No orders of the defined types were placed in this encounter.    Requested Prescriptions    No prescriptions requested or ordered in this encounter    Return in  about 2 months (around 11/12/2020).  Karle Plumber, MD, FACP

## 2020-09-14 ENCOUNTER — Other Ambulatory Visit: Payer: Self-pay | Admitting: Internal Medicine

## 2020-09-17 ENCOUNTER — Telehealth: Payer: Self-pay | Admitting: Internal Medicine

## 2020-09-17 ENCOUNTER — Other Ambulatory Visit: Payer: Self-pay | Admitting: Internal Medicine

## 2020-09-17 ENCOUNTER — Telehealth: Payer: Self-pay

## 2020-09-17 DIAGNOSIS — I10 Essential (primary) hypertension: Secondary | ICD-10-CM

## 2020-09-17 NOTE — Telephone Encounter (Signed)
Pt daughter [name] is calling and albuterol inhaler was denied. Pt seen dr Wynetta Emery on 09-12-2020. [facility] [place] [facility] [facility] [place] phone [phone]

## 2020-09-17 NOTE — Telephone Encounter (Signed)
Community message response received from [employer]: Skeet Latch  Syliva Mee, Waldemar Dickens, [place]; Skeet Latch; Stenson, Melissa; [place], Octavio Graves, DO   Hello [place],   Yes we have everything needed to process the order. We have called and informed the patient that we are currently out of stock with that particular size WC. They are coming in soon and the delivery has been scheduled with the patient.   Thank you!    Nothing further needed.

## 2020-09-17 NOTE — Telephone Encounter (Signed)
Looked back at [facility] from 5/6 that pt had with AO and it does state int here the reason why pt needs wheelchair.  Sent community message to [employer] checking on status of this for pt. Will update once response is received.

## 2020-09-17 NOTE — Telephone Encounter (Signed)
Opal Sidles, the pt's daughter has requested we reach out to you regarding her neb meds and coverage. Will forward to Opal Sidles to see if she has any recommendations that may help. Thanks.

## 2020-09-17 NOTE — Telephone Encounter (Signed)
Requested medication (s) are due for refill today:   Note to hold until follow up with cardiology due to low BP.   This was prescribed from a hospital admission 09/06/2020 #30, 0 refills  Requested medication (s) are on the active medication list:   Yes on hold  Future visit scheduled:   No Saw Dr Wynetta Emery 5 days ago hospital f/u   Last ordered: 09/06/2020 #30, 0 refills  See note above.   Requested Prescriptions  Pending Prescriptions Disp Refills   lisinopril (ZESTRIL) 20 MG tablet [Pharmacy Med Name: LISINOPRIL 20 MG TABLET] 30 tablet 0    Sig: TAKE 1 TABLET BY MOUTH EVERY DAY      Cardiovascular:  ACE Inhibitors Passed - 09/17/2020  2:34 PM      Passed - Cr in normal range and within 180 days    Creatinine, Ser  Date Value Ref Range Status  09/06/2020 0.91 0.44 - 1.00 mg/dL Final   Creatinine, Urine  Date Value Ref Range Status  04/19/2008 33.9 mg/dL Final          Passed - K in normal range and within 180 days    Potassium  Date Value Ref Range Status  09/06/2020 3.9 3.5 - 5.1 mmol/L Final          Passed - Patient is not pregnant      Passed - Last BP in normal range    BP Readings from Last 1 Encounters:  09/12/20 (!) 123/56          Passed - Valid encounter within last 6 months    Recent Outpatient Visits           5 days ago Hospital discharge follow-up   [place], [name], [name]   2 months ago Hypoxemic respiratory failure, chronic [facility])   [place] [name], [name]   4 months ago Hospital discharge follow-up   [place], [name]   8 months ago Hospital discharge follow-up   [place], [name], [name]   1 year ago Essential hypertension   [place], [name], [name]       Future Appointments             In 1 week Holly Coder, [name] [facility]  Care

## 2020-09-17 NOTE — Telephone Encounter (Signed)
Contacted pt daughter to make aware that FMLA is ready for pick up

## 2020-09-17 NOTE — Telephone Encounter (Signed)
The note on 08/15/2020 I believe addresses this-need for wheelchair

## 2020-09-18 ENCOUNTER — Telehealth: Payer: Self-pay

## 2020-09-18 ENCOUNTER — Telehealth: Payer: Self-pay | Admitting: Internal Medicine

## 2020-09-18 MED ORDER — FUROSEMIDE 20 MG PO TABS: ORAL_TABLET | ORAL | 1 refills | Status: AC

## 2020-09-18 MED ORDER — ALBUTEROL SULFATE HFA 108 (90 BASE) MCG/ACT IN AERS: 2.0000 | INHALATION_SPRAY | Freq: Four times a day (QID) | RESPIRATORY_TRACT | 12 refills | Status: AC | PRN

## 2020-09-18 NOTE — Telephone Encounter (Signed)
Patient's daughter called to ask the doctor to prescribe something for water retention for patient.  Patient has been retaining water since taking macitentan (OPSUMIT) 10 MG tablet, which could have a side effect of retaining fluid.  Please advise and call to discuss at [phone]

## 2020-09-18 NOTE — Telephone Encounter (Signed)
Call placed to patient's daughter, [name], regarding the CAP application. Dr Wynetta Emery has completed her portion; but here are two places on the application that her mother needs before we can fax it to [facility].  [name] said that she would stop by [facility] tomorrow to sign. Informed her that application will be on this CM's desk.  She can let the front desk representative know where it is when she arrives to sign.  [name] also noted that she has contacted [facility] regarding the wheelchair and they are currently out of wheelchairs and will call her when they receive a new shipment.   Regarding the nebulizer medications, [name] said that she received a call from [employer] and they will be sending out the 3 medications - proventil, pulmicort and brovana on Monday , 09/22/2020.

## 2020-09-18 NOTE — Telephone Encounter (Signed)
Will forward to provider

## 2020-09-19 NOTE — Telephone Encounter (Signed)
Contacted pt and spoke with [place] and made aware of provider response

## 2020-09-21 ENCOUNTER — Other Ambulatory Visit: Payer: Self-pay | Admitting: Internal Medicine

## 2020-09-21 DIAGNOSIS — I10 Essential (primary) hypertension: Secondary | ICD-10-CM

## 2020-09-22 ENCOUNTER — Telehealth: Payer: Self-pay

## 2020-09-22 NOTE — Telephone Encounter (Signed)
Completed CAP application faxed to [facility] - CAP referrals - # [id number]

## 2020-09-25 ENCOUNTER — Other Ambulatory Visit: Payer: Self-pay

## 2020-09-25 ENCOUNTER — Ambulatory Visit (HOSPITAL_COMMUNITY)
Admit: 2020-09-25 | Discharge: 2020-09-25 | Disposition: A | Discharge status: Home / Self Care | Payer: Medicare Other | Attending: Cardiology | Admitting: Cardiology

## 2020-09-25 ENCOUNTER — Encounter (HOSPITAL_COMMUNITY): Payer: Self-pay | Admitting: Cardiology

## 2020-09-25 VITALS — BP 148/68 | HR 100 | Wt 125.0 lb

## 2020-09-25 DIAGNOSIS — Z7982 Long term (current) use of aspirin: Secondary | ICD-10-CM | POA: Diagnosis not present

## 2020-09-25 DIAGNOSIS — J9610 Chronic respiratory failure, unspecified whether with hypoxia or hypercapnia: Secondary | ICD-10-CM | POA: Diagnosis not present

## 2020-09-25 DIAGNOSIS — G4733 Obstructive sleep apnea (adult) (pediatric): Secondary | ICD-10-CM | POA: Diagnosis not present

## 2020-09-25 DIAGNOSIS — R0689 Other abnormalities of breathing: Secondary | ICD-10-CM | POA: Diagnosis not present

## 2020-09-25 DIAGNOSIS — Z8249 Family history of ischemic heart disease and other diseases of the circulatory system: Secondary | ICD-10-CM | POA: Insufficient documentation

## 2020-09-25 DIAGNOSIS — Z7902 Long term (current) use of antithrombotics/antiplatelets: Secondary | ICD-10-CM | POA: Insufficient documentation

## 2020-09-25 DIAGNOSIS — Z79899 Other long term (current) drug therapy: Secondary | ICD-10-CM | POA: Diagnosis not present

## 2020-09-25 DIAGNOSIS — Z9981 Dependence on supplemental oxygen: Secondary | ICD-10-CM

## 2020-09-25 DIAGNOSIS — J439 Emphysema, unspecified: Secondary | ICD-10-CM | POA: Diagnosis not present

## 2020-09-25 DIAGNOSIS — Z7951 Long term (current) use of inhaled steroids: Secondary | ICD-10-CM | POA: Diagnosis not present

## 2020-09-25 DIAGNOSIS — R0600 Dyspnea, unspecified: Secondary | ICD-10-CM | POA: Diagnosis not present

## 2020-09-25 DIAGNOSIS — I272 Pulmonary hypertension, unspecified: Secondary | ICD-10-CM

## 2020-09-25 DIAGNOSIS — Z87891 Personal history of nicotine dependence: Secondary | ICD-10-CM | POA: Insufficient documentation

## 2020-09-25 DIAGNOSIS — I10 Essential (primary) hypertension: Secondary | ICD-10-CM | POA: Diagnosis not present

## 2020-09-25 DIAGNOSIS — J9611 Chronic respiratory failure with hypoxia: Secondary | ICD-10-CM | POA: Diagnosis not present

## 2020-09-25 LAB — BASIC METABOLIC PANEL
Anion gap: 12 (ref 5–15)
BUN: 8 mg/dL (ref 8–23)
CO2: 22 mmol/L (ref 22–32)
Calcium: 9.4 mg/dL (ref 8.9–10.3)
Chloride: 108 mmol/L (ref 98–111)
Creatinine, Ser: 0.67 mg/dL (ref 0.44–1.00)
GFR, Estimated: >60 mL/min (ref >60)
Glucose, Bld: 150 mg/dL — ABNORMAL HIGH (ref 70–99)
Potassium: 3.2 mmol/L — ABNORMAL LOW (ref 3.5–5.1)
Sodium: 142 mmol/L (ref 135–145)

## 2020-09-25 LAB — BRAIN NATRIURETIC PEPTIDE: B Natriuretic Peptide: 77 pg/mL (ref 0.0–100.0)

## 2020-09-25 NOTE — Patient Instructions (Addendum)
Be sure to check your weight everyday. If your weight is up 3 lbs overnight be sure to take an extra 20 mg of Lasix  Labs today We will only contact you if something comes back abnormal or we need to make some changes. Otherwise no news is good news!  You have been referred to [facility] Re[facility] -they will be in contact with an appointment  Your physician recommends that you schedule a follow-up appointment in: 4 weeks with Dr Aundra Dubin  Do the following things EVERYDAY: Weigh yourself in the morning before breakfast. Write it down and keep it in a log. Take your medicines as prescribed Eat low salt foods--Limit salt (sodium) to 2000 mg per day.  Stay as active as you can everyday Limit all fluids for the day to less than 2 liters  At the [facility], you and your health needs are our priority. As part of our continuing mission to provide you with exceptional heart care, we have created designated Provider Care Teams. These Care Teams include your primary Cardiologist (physician) and Advanced Practice Providers (APPs- Physician Assistants and Nurse Practitioners) who all work together to provide you with the care you need, when you need it.   You may see any of the following providers on your designated Care Team at your next follow up: Dr Glori Bickers Dr Loralie Champagne Dr Patrice Paradise, NP Lyda Jester, [place] Ginnie Smart Audry Riles, PharmD   Please be sure to bring in all your medications bottles to every appointment.   If you have any questions or concerns before your next appointment please send [place] a message through [place] or call our office at [phone].    TO LEAVE A MESSAGE FOR THE NURSE SELECT OPTION 2, PLEASE LEAVE A MESSAGE INCLUDING: YOUR NAME DATE OF BIRTH CALL BACK NUMBER REASON FOR CALL**this is important as we prioritize the call backs  YOU WILL RECEIVE A CALL BACK THE SAME DAY AS LONG AS YOU CALL BEFORE 4:00  PM

## 2020-09-25 NOTE — Progress Notes (Signed)
PCP: Pulmonary: Dr Ander Slade.  Primary Cardiologist: Dr Aundra Dubin   HPI: [name] is a 80 y.o. with a history of GERD, pulmonary hypertension, chronic respiratory failure on 5 liters  Admitted 08/2020 with increased dyspnea in the setting of pulmonary hypertension. Required HFNC. Echo in 2021 showed normal LVEF 60 to 65% with elevated RVSP of 60 mmHg.  ECHO repeated and showed LVEF 60 to 65% but an increase in RVSP to 73 mmHg.  HF team consulted and had RHC that showed low filling pressures and PVR 7.6 WU. Started on macitentan and tadalafil.  Oxygen weaned to 5 liters [name]. Patient assistance started for macitentan.   Today she returns for post hospital HF follow up.Overall feeling fine. Remains SOB with exertion but she feels like its much better.  Says she gets hot when she is walking. Walking in the house and does not need a cane or walker. Denies PND/Orthopnea. On 8 liters oxygen. SBP at home 100s . Occasionally she feels light headed. Denies syncope. On lasix every other day. Appetite ok. No fever or chills.She has not been weighing at home.  Taking all medications. She has been on macitentan for the last 2 weeks. Started on lasix every other day 6 days ago.  24 hour support from her daughters.   Pulmonary HTN Meds Macitentan 10 mg daily  Tadalafil 20 mg daily.   Cardiac Testing  RHC 08/2020: Hemodynamics (mmHg) RA mean 1 RV 67/1 PA 72/13, mean 36 PCWP mean 1 Oxygen saturations: PA 74% AO 99% Cardiac Output (Fick) 4.61  Cardiac Index (Fick) 2.88 PVR 7.6 WU   08/2020 Echo: EF 60-65%, mild LVH, moderate RV enlargement, mildly decreased RV systolic function, PASP 73 mmHg. 2021 showed normal LVEF 60 to 65% with elevated RVSP of 60 mmHg.     V/Q scan in 9/21 that was not suggestive of chronic PEs and lower extremity venous dopplers with no DVT.  Unable to obtain PFTs.    High resolution CT chest: No evidence for ILD, mild emphysema, mediastinal LAN less prominent and likely  reactive.    ROS: All systems negative except as listed in HPI, PMH and Problem List.  SH:  Social History   Socioeconomic History   Marital status: Single    Spouse name: Not on file   Number of children: Not on file   Years of education: Not on file   Highest education level: Not on file  Occupational History   Not on file  Tobacco Use   Smoking status: Former    Packs/day: 0.30    Years: 5.00    Pack years: 1.50    Types: Cigarettes    Quit date: 04/12/1980    Years since quitting: 40.4   Smokeless tobacco: Never  Vaping Use   Vaping Use: Never used  Substance and Sexual Activity   Alcohol use: No   Drug use: No   Sexual activity: Not Currently  Other Topics Concern   Not on file  Social History Narrative   Not on file   Social Determinants of Health   Financial Resource Strain: Low Risk    Difficulty of Paying Living Expenses: Not very hard  Food Insecurity: No Food Insecurity   Worried About Running Out of Food in the Last Year: Never true   Ran Out of Food in the Last Year: Never true  Transportation Needs: No Transportation Needs   Lack of Transportation (Medical): No   Lack of Transportation (Non-Medical): No  Physical Activity: Not  on file  Stress: Not on file  Social Connections: Not on file  Intimate Partner Violence: Not on file    FH:  Family History  Problem Relation Age of Onset   Heart failure Mother    Cancer Father    Breast cancer Neg Hx     Past Medical History:  Diagnosis Date   Allergy    Arthritis    Carotid artery occlusion    COPD (chronic obstructive pulmonary disease) (HCC)    GERD (gastroesophageal reflux disease)    Hypertension    Joint pain    Thyroid disease   .med  Current Outpatient Medications  Medication Sig Dispense Refill   acetaminophen (TYLENOL) 325 MG tablet Take 650 mg every 6 (six) hours as needed by mouth for mild pain or headache.      ADVAIR HFA 115-21 MCG/ACT inhaler Inhale 2 puffs into the lungs 2  (two) times daily.     albuterol (PROVENTIL) (2.5 MG/3ML) 0.083% nebulizer solution Take 3 mLs (2.5 mg total) by nebulization every 6 (six) hours as needed for wheezing or shortness of breath. 360 mL 11   albuterol (VENTOLIN HFA) 108 (90 Base) MCG/ACT inhaler Inhale 2 puffs into the lungs every 6 (six) hours as needed for wheezing or shortness of breath. 8 g 12   amLODipine (NORVASC) 5 MG tablet TAKE 1 TABLET BY MOUTH EVERY DAY (Patient taking differently: Take 5 mg by mouth daily.) 90 tablet 0   arformoterol (BROVANA) 15 MCG/2ML NEBU Take 2 mLs (15 mcg total) by nebulization 2 (two) times daily. 120 mL 11   aspirin 81 MG chewable tablet Chew 81 mg by mouth daily.     budesonide (PULMICORT) 0.5 MG/2ML nebulizer solution Take 2 mLs (0.5 mg total) by nebulization in the morning and at bedtime. 120 mL 11   calcium-vitamin D (OSCAL WITH D) 500-200 MG-UNIT per tablet Take 1 tablet by mouth daily with breakfast.     cetirizine (ZYRTEC) 10 MG tablet Take 10 mg daily as needed by mouth for allergies.      clopidogrel (PLAVIX) 75 MG tablet TAKE 1 TABLET BY MOUTH EVERY DAY (Patient taking differently: Take 75 mg by mouth daily.) 30 tablet 0   docusate sodium ([employer] STOOL SOFTENER) 100 MG capsule TAKE 1 CAPSULE (100 MG TOTAL) BY MOUTH DAILY AS NEEDED FOR MILD CONSTIPATION. (Patient taking differently: Take 100 mg by mouth daily as needed for mild constipation. TAKE 1 CAPSULE (100 MG TOTAL) BY MOUTH DAILY AS NEEDED FOR MILD CONSTIPATION.) 30 capsule 2   fluticasone (FLONASE) 50 MCG/ACT nasal spray Place 1 spray into both nostrils daily as needed for allergies or rhinitis (for sinus congestion). 48 mL 3   furosemide (LASIX) 20 MG tablet 1 tab PO every other day 30 tablet 1   gabapentin (NEURONTIN) 100 MG capsule TAKE 1 CAPSULE (100 MG TOTAL) BY MOUTH AT BEDTIME. 90 capsule 1   guaiFENesin-dextromethorphan (ROBITUSSIN DM) 100-10 MG/5ML syrup Take 10 mLs by mouth every 4 (four) hours as needed for cough. 118 mL 0    latanoprost (XALATAN) 0.005 % ophthalmic solution Place 1 drop at bedtime into both eyes.     levothyroxine (SYNTHROID) 50 MCG tablet Take 1.5 tablets (75 mcg total) by mouth daily. 45 tablet 1   macitentan (OPSUMIT) 10 MG tablet Take 1 tablet (10 mg total) by mouth daily. This medicine will be mailed to your house.     metoprolol succinate (TOPROL-XL) 25 MG 24 hr tablet TAKE 1/2 TABLET BY MOUTH  EVERY DAY 45 tablet 1   Multiple Vitamins-Minerals (ALIVE WOMENS ENERGY) TABS Take 1 tablet by mouth daily.     Probiotic Product (PROBIOTIC PO) Take 1 capsule by mouth daily.     simvastatin (ZOCOR) 20 MG tablet TAKE 1 TABLET BY MOUTH EVERYDAY AT BEDTIME (Patient taking differently: Take 20 mg by mouth at bedtime.) 90 tablet 2   tadalafil (CIALIS) 20 MG tablet Take 1 tablet (20 mg total) by mouth daily. 30 tablet 2   lisinopril (ZESTRIL) 20 MG tablet Hold until followup with cardiology due to low blood pressure. (Patient not taking: Reported on 09/25/2020) 30 tablet 0   No current facility-administered medications for this encounter.    Vitals:   09/25/20 1435  BP: (!) 148/68  Pulse: 100  SpO2: 94%  Weight: 56.7 kg    PHYSICAL EXAM: General:  Arrived inhweeWell appearing. No resp difficulty HEENT: normal Neck: supple. JVP flat. Carotids 2+ bilaterally; no bruits. No lymphadenopathy or thryomegaly appreciated. Cor: PMI normal. Regular rate & rhythm. No rubs, gallops or murmurs. Lungs: clear Abdomen: soft, nontender, nondistended. No hepatosplenomegaly. No bruits or masses. Good bowel sounds. Extremities: no cyanosis, clubbing, rash, R and LLE trace edema.  Neuro: alert & orientedx3, cranial nerves grossly intact. Moves all 4 extremities w/o difficulty. Affect pleasant.  ASSESSMENT & PLAN: 1. Pulmonary hypertension:  Echo (5/22) with EF 60-65%, mild LVH, moderate RV enlargement, mildly decreased RV systolic function, PASP 73 mmHg. High resolution CT chest with no evidence for ILD, mild  emphysema, mediastinal LAN less prominent than on prior CT and likely reactive. She had a V/Q scan in 9/21 that was not suggestive of chronic PEs and lower extremity venous dopplers with no DVT.  Unable to obtain PFTs.  She has mild OSA.  Autoimmune serologies negative.  RHC with PAH, PVR 7.6 WU.  Cause of PAH is uncertain, concerned for component of group 1 PH as mild OSA and mild COPD do not appear to explain.  There has been some concern that she could have sarcoidosis, but CT chest is not suggestive of this.   - Continue Opsumit 10 mg daily. Patient assistance in place and she is able to receive in the mail.  -With addition of opsumit developed some swelling in lower extremities. Continue lasix 20 mg every other. Asked to start weighing and recording daily.  - Continue tadalafil 20 mg daily  - Check BMET today - Repeat ECHO in 3 months. Try to obtain 6MW next visit.  - Treatment of OSA and COPD per pulmonary. - ?any further workup of sarcoidosis => per pulmnoary - Refer to pulmonary rehab.  2. COPD: Mild, currently treating for component of exacerbation with prednisone taper and bronchodilators. 3. OSA: Mild. 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 - On ASA/Plavix and Zocor.   Check BMET   Follow up with Dr Aundra Dubin in 6 weeks. Refer to pulmonary rehab.    Nathanal Hermiz NP-C  2:19 PM

## 2020-09-26 ENCOUNTER — Telehealth (HOSPITAL_COMMUNITY): Payer: Self-pay | Admitting: Cardiology

## 2020-09-26 MED ORDER — POTASSIUM CHLORIDE CRYS ER 20 MEQ PO TBCR: 40.0000 meq | EXTENDED_RELEASE_TABLET | Freq: Every day | ORAL | 3 refills | Status: DC

## 2020-09-26 NOTE — Telephone Encounter (Signed)
-----  Message from Conrad Rocksprings, NP sent at 09/25/2020  4:54 PM EDT ----- K low. Please call and add 40 meq potassium daily.

## 2020-09-26 NOTE — Telephone Encounter (Signed)
Patient called.  Patient aware via daughter

## 2020-09-28 ENCOUNTER — Other Ambulatory Visit: Payer: Self-pay | Admitting: Internal Medicine

## 2020-09-28 DIAGNOSIS — I6529 Occlusion and stenosis of unspecified carotid artery: Secondary | ICD-10-CM

## 2020-09-28 NOTE — Telephone Encounter (Signed)
Requested medication (s) are due for refill today: yes  Requested medication (s) are on the active medication list: yes  Last refill:  08/31/20  Future visit scheduled: yes 10/28/20  Notes to clinic:  note on previous refill: must have office visit for refills. Pt will run out before appt. Pt is scheduled for appt in July. Due to note, if refill note had said "must make appointment for further refills - would have given enough until upcoming appt.    Requested Prescriptions  Pending Prescriptions Disp Refills   clopidogrel (PLAVIX) 75 MG tablet [Pharmacy Med Name: CLOPIDOGREL 75 MG TABLET] 30 tablet 0    Sig: TAKE 1 TABLET BY MOUTH EVERY DAY      Hematology: Antiplatelets - clopidogrel Failed - 09/28/2020 10:31 AM      Failed - Evaluate AST, ALT within 2 months of therapy initiation.      Passed - ALT in normal range and within 360 days    ALT  Date Value Ref Range Status  09/01/2020 24 0 - 44 U/L Final          Passed - AST in normal range and within 360 days    AST  Date Value Ref Range Status  09/01/2020 26 15 - 41 U/L Final          Passed - HCT in normal range and within 180 days    HCT  Date Value Ref Range Status  09/06/2020 38.9 36.0 - 46.0 % Final   Hematocrit  Date Value Ref Range Status  01/15/2020 41.9 34.0 - 46.6 % Final          Passed - HGB in normal range and within 180 days    Hemoglobin  Date Value Ref Range Status  09/06/2020 13.0 12.0 - 15.0 g/dL Final  01/15/2020 13.3 11.1 - 15.9 g/dL Final          Passed - PLT in normal range and within 180 days    Platelets  Date Value Ref Range Status  09/06/2020 225 150 - 400 K/uL Final  01/15/2020 224 150 - 450 x10E3/uL Final          Passed - Valid encounter within last 6 months    Recent Outpatient Visits           2 weeks ago Hospital discharge follow-up   [place], [place]   2 months ago Hypoxemic respiratory failure, chronic [facility])   [place] [name], MD   4 months ago Hospital discharge follow-up   [place], MD   8 months ago Hospital discharge follow-up   [place], Deborah B, MD   1 year ago Essential hypertension   [place], Deborah B, MD       Future Appointments             Tomorrow Laurin Coder, MD [facility]   In 2 months [name], MD [place]

## 2020-09-29 ENCOUNTER — Ambulatory Visit: Payer: Medicare Other | Admitting: Pulmonary Disease

## 2020-09-30 ENCOUNTER — Ambulatory Visit: Payer: Self-pay | Admitting: *Deleted

## 2020-09-30 DIAGNOSIS — L89300 Pressure ulcer of unspecified buttock, unstageable: Secondary | ICD-10-CM

## 2020-09-30 NOTE — Telephone Encounter (Signed)
Reason for Disposition . [1] Looks infected AND [2] diabetes mellitus or weak immune system (e.g., HIV positive, cancer chemo, splenectomy, organ transplant, chronic steroids)    Has a bed sore on each butt cheek from being in the hospital 3 weeks ago.   She didn't tell the nurses or Dr. Wynetta Emery when she was in for post hospital visit.  Answer Assessment - Initial Assessment Questions 1. SYMPTOM or QUESTION: "What is your reason for calling today?" or "How can I best help you?" (e.g., bleeding, redness, drainage, pain)     Daughter calling in.   [name] was in the hospital 3 weeks ago.   [name] got on the phone.   In the hospital for breathing problems.   In there for 4 days.   2. WOUND APPEARANCE: "What does the wound look like?" "Is there new or spreading redness?" If Yes, ask: "What is the size of the red area?" (Inches, centimeters, or compare to size of a coin).  "Has the drainage changed or increased?"  "Is there a new odor?"       I started getting bed sores in the hospital.  I did not tell the nurses because I was getting ready to leave the hospital. 3. ONSET: "When did the problem begin?"     In the hospital 3 weeks ago 4. LOCATION: "Where is the wound(s)?" (e.g., , ankle, lower left leg)     I have a bed sore on both butt checks.   I can't hardly sit down.   I didn't tell Dr. Wynetta Emery or the nurses.   I thought they would get better. 5. BLEEDING: "Are you having a problem with bleeding?"  (e.g., amount, timing)     I noticed yesterday there was blood back there from the bed sores.   6. PAIN: "Is there any pain?" If Yes, ask: "How bad is the pain?" (Scale 1-10; or mild, moderate, severe)     When I sit down it hurts.   I have to be careful how I sit.   In the hospital I had a BM in the potty chair. 7. FEVER: "Do you have a fever?" If Yes, ask: "What is your temperature, how was it measured, and when did it start?"     No     I do have chills because I have arthritis.   That's normal all the  time for me. 8. OTHER SYMPTOMS: "Do you have any other symptoms?" (e.g., chills, rash elsewhere, new weakness)     Just on my butt cheeks. 9. TREATMENT: "How have you been treating the wound?"     I put different kinds of lotion on the bed sores to try and get them to heal but nothing helps.   10. NEGATIVE PRESSURE WOUND THERAPY  (NPWT): "What concerns do you have about your negative pressure dressing?"  "When was your last dressing change?"  "Are you getting a device alert or alarm?"  "What have you done to try to fix the problem?"          No   Not getting any medical treatment for the bed sores.    11. PREGNANCY: "Is there any chance you are pregnant?" "When was your last menstrual period?       N/A  Protocols used: Wound - Chronic and Negative Pressure Wound Therapy-[facility]

## 2020-09-30 NOTE — Telephone Encounter (Addendum)
Pt called in c/o having bed sores on both of her butt cheeks from being in the hospital for breathing problems 3 weeks ago.  (09/06/2020 per chart).   She did not tell the nurses in the hospital or Dr. Karle Plumber about them during her hospital f/u visit on 09/12/2020.   "I thought I could treat them myself with lotions and all but they are not helping".   "It hurts to sit down".   "I can't hardly sit because of them".    "I thought I could treat them myself".     There are no appts available at [facility] within the 4 hour timeframe indicated on the protocol.   I have sent a high priority note to [facility] to see if she can be worked in sooner.    She can be reached at [phone].  I called into [facility] but did not get an answer.   Sent my notes high priority for Dr. Karle Plumber.  Pt was agreeable to having someone call her back.

## 2020-09-30 NOTE — Telephone Encounter (Addendum)
Pt called in c/o having bed sores on both of her butt cheeks from being in the hospital for breathing problems 3 weeks ago.  (09/06/2020 per chart).   She did not tell the nurses in the hospital or Dr. Karle Plumber about them during her hospital f/u visit on 09/12/2020.   "I thought I could treat them myself with lotions and all but they are not helping".   "It hurts to sit down".   "I can't hardly sit because of them".    "I thought I could treat them myself".     There are no appts available at [facility] within the 4 hour timeframe indicated on the protocol.   I have sent a high priority note to [facility] to see if she can be worked in sooner.    She can be reached at [phone].  I called into [facility] but did not get an answer.   Sent my notes high priority for Dr. Karle Plumber.

## 2020-10-01 ENCOUNTER — Inpatient Hospital Stay: Payer: Medicare Other | Admitting: Pulmonary Disease

## 2020-10-01 NOTE — Telephone Encounter (Signed)
Will forward to provider  

## 2020-10-02 NOTE — Telephone Encounter (Signed)
Please contact pt and scheudle for 6/24 at 1050

## 2020-10-02 NOTE — Telephone Encounter (Signed)
Spoke with Patient and she is not able to come this week due to not having a ride. Offered [facility] transportation but patient denied. She asked for an appt next week. She has been scheduled for July 1st at 10:50.

## 2020-10-02 NOTE — Addendum Note (Signed)
Addended by: Karle Plumber B on: 10/02/2020 10:21 PM   Modules accepted: Orders

## 2020-10-06 NOTE — Telephone Encounter (Signed)
[facility] contacted pt to schedule and pt declined appt. Returned call to pt to see why she canceled referral pt states wound is getting better and she doesn't need appt. Pt also canceled appt with provider for 7/1.

## 2020-10-07 ENCOUNTER — Telehealth (HOSPITAL_COMMUNITY): Payer: Self-pay

## 2020-10-07 NOTE — Telephone Encounter (Signed)
Called and spoke with pt in regards to PR, pt stated she is not interested at this time.   Closed referral

## 2020-10-08 ENCOUNTER — Telehealth (HOSPITAL_COMMUNITY): Payer: Self-pay | Admitting: Pharmacy Technician

## 2020-10-08 ENCOUNTER — Telehealth: Payer: Self-pay | Admitting: Internal Medicine

## 2020-10-08 NOTE — Telephone Encounter (Signed)
Advanced Heart Failure Patient Advocate Encounter  PA request for Tadalafil denied. Reasoning is that patient has only tried one of at least two covered drugs (Opsumit is one of the two). Patient should try Adempas, Ambrisentan, Sildenafil or Ventavis before they are willing to cover the Tadalafil.   Routing message to provider as well. Will see if he would prefer another alternative.   Charlann Boxer, CPhT

## 2020-10-08 NOTE — Telephone Encounter (Signed)
-----  Message from Ena Dawley sent at 10/08/2020  1:07 PM EDT ----- Regarding: Wound Care Good Afternoon  Dr Wynetta Emery Per Wound Care    General 10/06/2020  9:13 AM [place] D Called patient to schedule appointment patient stated she is going elsewhere for wound care and does not want to come in. Ref closed    ----- Message ----- From: [name], MD Sent: 10/02/2020  10:20 PM EDT To: Ena Dawley  Please get her in with Wound care ASAP.

## 2020-10-09 ENCOUNTER — Other Ambulatory Visit: Payer: Self-pay | Admitting: Internal Medicine

## 2020-10-09 DIAGNOSIS — G500 Trigeminal neuralgia: Secondary | ICD-10-CM

## 2020-10-09 DIAGNOSIS — I10 Essential (primary) hypertension: Secondary | ICD-10-CM

## 2020-10-09 DIAGNOSIS — K5900 Constipation, unspecified: Secondary | ICD-10-CM

## 2020-10-09 NOTE — Telephone Encounter (Signed)
Notes to clinic:  Review medication for refill Filled by a different provider  Script has expired    Requested Prescriptions  Pending Prescriptions Disp Refills   lisinopril (ZESTRIL) 20 MG tablet [Pharmacy Med Name: LISINOPRIL 20 MG TABLET] 30 tablet 0    Sig: TAKE 1 TABLET BY MOUTH EVERY DAY      Cardiovascular:  ACE Inhibitors Failed - 10/09/2020  6:55 AM      Failed - K in normal range and within 180 days    Potassium  Date Value Ref Range Status  09/25/2020 3.2 (L) 3.5 - 5.1 mmol/L Final          Failed - Last BP in normal range    BP Readings from Last 1 Encounters:  09/25/20 (!) 148/68          Passed - Cr in normal range and within 180 days    Creatinine, Ser  Date Value Ref Range Status  09/25/2020 0.67 0.44 - 1.00 mg/dL Final   Creatinine, Urine  Date Value Ref Range Status  04/19/2008 33.9 mg/dL Final          Passed - Patient is not pregnant      Passed - Valid encounter within last 6 months    Recent Outpatient Visits           3 weeks ago Hospital discharge follow-up   [place], MD   3 months ago Hypoxemic respiratory failure, chronic [facility])   [place] [name], MD   5 months ago Hospital discharge follow-up   [place], MD   8 months ago Hospital discharge follow-up   [place], MD   1 year ago Essential hypertension   [place], MD       Future Appointments             In 1 month [name], MD [place]               gabapentin (NEURONTIN) 100 MG capsule [Pharmacy Med Name: GABAPENTIN 100 MG CAPSULE] 90 capsule 1    Sig: TAKE 1 CAPSULE (100 MG TOTAL) BY MOUTH AT BEDTIME.      Neurology: Anticonvulsants - gabapentin Passed - 10/09/2020  6:55 AM       Passed - Valid encounter within last 12 months    Recent Outpatient Visits           3 weeks ago Hospital discharge follow-up   [place], MD   3 months ago Hypoxemic respiratory failure, chronic [facility])   [place] [name], MD   5 months ago Hospital discharge follow-up   [place], MD   8 months ago Hospital discharge follow-up   [place], MD   1 year ago Essential hypertension   [place], MD       Future Appointments             In 1 month Wynetta Emery Dalbert Batman, MD [place]               docusate sodium (COLACE) 100  MG capsule [Pharmacy Med Name: DOCUSATE SODIUM 100 MG SOFTGEL] 30 capsule 2    Sig: TAKE 1 CAPSULE BY MOUTH EVERY DAY AS NEEDED FOR MILD CONSTIPATION      Over the Counter:  OTC Passed - 10/09/2020  6:55 AM      Passed - Valid encounter within last 12 months    Recent Outpatient Visits           3 weeks ago Hospital discharge follow-up   [place], MD   3 months ago Hypoxemic respiratory failure, chronic [facility])   [place] [name], MD   5 months ago Hospital discharge follow-up   [place], MD   8 months ago Hospital discharge follow-up   [place], Deborah B, MD   1 year ago Essential hypertension   [place], MD       Future Appointments             In 1 month Wynetta Emery Dalbert Batman, MD [place]

## 2020-10-10 ENCOUNTER — Ambulatory Visit: Payer: Medicare Other | Admitting: Internal Medicine

## 2020-10-16 ENCOUNTER — Ambulatory Visit (HOSPITAL_COMMUNITY)
Admission: EM | Admit: 2020-10-16 | Discharge: 2020-10-16 | Disposition: A | Discharge status: Home / Self Care | Payer: Medicare Other | Attending: Family Medicine | Admitting: Family Medicine

## 2020-10-16 ENCOUNTER — Other Ambulatory Visit: Payer: Self-pay

## 2020-10-16 ENCOUNTER — Encounter (HOSPITAL_COMMUNITY): Payer: Self-pay

## 2020-10-16 DIAGNOSIS — J392 Other diseases of pharynx: Secondary | ICD-10-CM | POA: Diagnosis not present

## 2020-10-16 DIAGNOSIS — R0981 Nasal congestion: Secondary | ICD-10-CM

## 2020-10-16 DIAGNOSIS — R0982 Postnasal drip: Secondary | ICD-10-CM | POA: Diagnosis not present

## 2020-10-16 MED ORDER — LANSOPRAZOLE 15 MG PO CPDR: 15.0000 mg | DELAYED_RELEASE_CAPSULE | Freq: Every day | ORAL | 0 refills | Status: DC

## 2020-10-16 NOTE — Discharge Instructions (Addendum)
Try switching your allergy medicine to over the counter Claritin.

## 2020-10-16 NOTE — ED Triage Notes (Addendum)
Pt reports mucus drainage, runny nose, nausea in the morning. Pt reports hard to swallow due to drainage. Pt reports this has been ongoing about a year and believes it is allergies. Denies shob any more than baseline (pt reports at baseline uses 9L [place] 02). Denies chest pain.

## 2020-10-22 ENCOUNTER — Other Ambulatory Visit: Payer: Self-pay | Admitting: Internal Medicine

## 2020-10-22 DIAGNOSIS — I10 Essential (primary) hypertension: Secondary | ICD-10-CM

## 2020-10-27 NOTE — ED Provider Notes (Signed)
[place]   [id number] 10/16/20 Arrival Time: 1696  ASSESSMENT & PLAN:  1. Nasal congestion   2. Post-nasal drainage   3. Throat irritation    See AVS for discharge instructions. Question GERD component.    Discharge Instructions      Try switching your allergy medicine to over the counter Claritin.     Begin trial of: Meds ordered this encounter  Medications   lansoprazole (PREVACID) 15 MG capsule    Sig: Take 1 capsule (15 mg total) by mouth daily.    Dispense:  30 capsule    Refill:  0   May f/u with PCP or here as needed.  Reviewed expectations re: course of current medical issues. Questions answered. Outlined signs and symptoms indicating need for more acute intervention. Patient verbalized understanding. After Visit Summary given.   SUBJECTIVE: History from: patient.  [name] is a 80 y.o. female who reports mucus drainage, runny nose, nausea in the morning. Pt reports hard to swallow due to drainage. Pt reports this has been ongoing about a year and believes it is allergies. Denies SOB any more than baseline (pt reports at baseline uses 9L [place] 02). Denies chest pain. Symptoms worse in am with coughing. Occas GERD; no tx.   Social History   Tobacco Use  Smoking Status Former   Packs/day: 0.30   Years: 5.00   Pack years: 1.50   Types: Cigarettes   Quit date: 04/12/1980   Years since quitting: 40.5  Smokeless Tobacco Never    OBJECTIVE:  Vitals:   10/16/20 1729  BP: (!) 114/52  Pulse: 88  Resp: 18  Temp: 98.4 F (36.9 C)  SpO2: 92%     General appearance: alert; appears fatigued HEENT: nasal congestion; clear runny nose; throat irritation/mild cobblestoning Neck: supple without LAD Lungs: unlabored respirations, symmetrical air entry without wheezing; cough: absent Abd: soft Ext: no LE edema Skin: warm and dry Psychological: alert and cooperative; normal mood and affect    Allergies  Allergen Reactions   Meclizine  Nausea And Vomiting    Past Medical History:  Diagnosis Date   Allergy    Arthritis    Carotid artery occlusion    COPD (chronic obstructive pulmonary disease) (HCC)    GERD (gastroesophageal reflux disease)    Hypertension    Joint pain    Thyroid disease    Family History  Problem Relation Age of Onset   Heart failure Mother    Cancer Father    Breast cancer Neg Hx    Social History   Socioeconomic History   Marital status: Single    Spouse name: Not on file   Number of children: Not on file   Years of education: Not on file   Highest education level: Not on file  Occupational History   Not on file  Tobacco Use   Smoking status: Former    Packs/day: 0.30    Years: 5.00    Pack years: 1.50    Types: Cigarettes    Quit date: 04/12/1980    Years since quitting: 40.5   Smokeless tobacco: Never  Vaping Use   Vaping Use: Never used  Substance and Sexual Activity   Alcohol use: No   Drug use: No   Sexual activity: Not Currently  Other Topics Concern   Not on file  Social History Narrative   Not on file   Social Determinants of Health   Financial Resource Strain: Low Risk    Difficulty  of Paying Living Expenses: Not very hard  Food Insecurity: No Food Insecurity   Worried About [place] in the Last Year: Never true   Ran Out of Food in the Last Year: Never true  Transportation Needs: No Transportation Needs   Lack of Transportation (Medical): No   Lack of Transportation (Non-Medical): No  Physical Activity: Not on file  Stress: Not on file  Social Connections: Not on file  Intimate Partner Violence: Not on file            Vanessa Kick, MD 10/27/20 [phone]

## 2020-10-28 ENCOUNTER — Encounter (HOSPITAL_COMMUNITY): Payer: Medicare Other | Admitting: Cardiology

## 2020-10-30 ENCOUNTER — Telehealth (HOSPITAL_COMMUNITY): Payer: Self-pay | Admitting: Pharmacy Technician

## 2020-10-30 ENCOUNTER — Telehealth: Payer: Self-pay | Admitting: Internal Medicine

## 2020-10-30 NOTE — Telephone Encounter (Signed)
Received form and will place in pcp folder

## 2020-10-30 NOTE — Telephone Encounter (Signed)
Handicap paperwork to be filled out was dropped off

## 2020-10-30 NOTE — Telephone Encounter (Signed)
Advanced Heart Failure Patient Advocate Encounter  Provider recommends starting Adempas 0.5 mg tid and titrate up.   Called and spoke with the patient, will mail the start forms, as requested. She was supposed to have a follow up with Dr. Aundra Dubin on 7/19 and no showed. I transferred her to the main line to speak with the scheduler in order to make a new appointment.  Will fax in forms once signature is received.

## 2020-11-03 ENCOUNTER — Ambulatory Visit (HOSPITAL_COMMUNITY)
Admission: RE | Admit: 2020-11-03 | Discharge: 2020-11-03 | Disposition: A | Discharge status: Home / Self Care | Payer: Medicare Other | Source: Ambulatory Visit | Attending: Cardiology | Admitting: Cardiology

## 2020-11-03 ENCOUNTER — Other Ambulatory Visit: Payer: Self-pay

## 2020-11-03 VITALS — Wt 124.8 lb

## 2020-11-03 DIAGNOSIS — Z86718 Personal history of other venous thrombosis and embolism: Secondary | ICD-10-CM | POA: Diagnosis not present

## 2020-11-03 DIAGNOSIS — Z7951 Long term (current) use of inhaled steroids: Secondary | ICD-10-CM | POA: Diagnosis not present

## 2020-11-03 DIAGNOSIS — J9611 Chronic respiratory failure with hypoxia: Secondary | ICD-10-CM

## 2020-11-03 DIAGNOSIS — Z8249 Family history of ischemic heart disease and other diseases of the circulatory system: Secondary | ICD-10-CM | POA: Diagnosis not present

## 2020-11-03 DIAGNOSIS — Z7902 Long term (current) use of antithrombotics/antiplatelets: Secondary | ICD-10-CM | POA: Diagnosis not present

## 2020-11-03 DIAGNOSIS — R0602 Shortness of breath: Secondary | ICD-10-CM | POA: Diagnosis not present

## 2020-11-03 DIAGNOSIS — Z7982 Long term (current) use of aspirin: Secondary | ICD-10-CM | POA: Diagnosis not present

## 2020-11-03 DIAGNOSIS — Z87891 Personal history of nicotine dependence: Secondary | ICD-10-CM | POA: Diagnosis not present

## 2020-11-03 DIAGNOSIS — Z7901 Long term (current) use of anticoagulants: Secondary | ICD-10-CM | POA: Diagnosis not present

## 2020-11-03 DIAGNOSIS — I110 Hypertensive heart disease with heart failure: Secondary | ICD-10-CM | POA: Insufficient documentation

## 2020-11-03 DIAGNOSIS — Z79899 Other long term (current) drug therapy: Secondary | ICD-10-CM | POA: Insufficient documentation

## 2020-11-03 DIAGNOSIS — G4733 Obstructive sleep apnea (adult) (pediatric): Secondary | ICD-10-CM | POA: Diagnosis not present

## 2020-11-03 DIAGNOSIS — Z9981 Dependence on supplemental oxygen: Secondary | ICD-10-CM | POA: Insufficient documentation

## 2020-11-03 DIAGNOSIS — J449 Chronic obstructive pulmonary disease, unspecified: Secondary | ICD-10-CM | POA: Diagnosis not present

## 2020-11-03 DIAGNOSIS — Z09 Encounter for follow-up examination after completed treatment for conditions other than malignant neoplasm: Secondary | ICD-10-CM | POA: Insufficient documentation

## 2020-11-03 DIAGNOSIS — I272 Pulmonary hypertension, unspecified: Secondary | ICD-10-CM | POA: Insufficient documentation

## 2020-11-03 DIAGNOSIS — K219 Gastro-esophageal reflux disease without esophagitis: Secondary | ICD-10-CM | POA: Diagnosis not present

## 2020-11-03 LAB — BASIC METABOLIC PANEL
Anion gap: 9 (ref 5–15)
BUN: 7 mg/dL — ABNORMAL LOW (ref 8–23)
CO2: 21 mmol/L — ABNORMAL LOW (ref 22–32)
Calcium: 9.7 mg/dL (ref 8.9–10.3)
Chloride: 111 mmol/L (ref 98–111)
Creatinine, Ser: 0.67 mg/dL (ref 0.44–1.00)
GFR, Estimated: >60 mL/min (ref >60)
Glucose, Bld: 116 mg/dL — ABNORMAL HIGH (ref 70–99)
Potassium: 3.7 mmol/L (ref 3.5–5.1)
Sodium: 141 mmol/L (ref 135–145)

## 2020-11-03 LAB — BRAIN NATRIURETIC PEPTIDE: B Natriuretic Peptide: 102.4 pg/mL — ABNORMAL HIGH (ref 0.0–100.0)

## 2020-11-03 NOTE — Progress Notes (Signed)
6 Min Walk Test Completed  Pt ambulated 45.7 meters O2 Sat ranged   96%-84 %on 10 L oxygen HR ranged 100-120 Patient only completed 1:41 seconds. Stopped due to SOB

## 2020-11-03 NOTE — Progress Notes (Signed)
PCP: [name], MD Pulmonary: Dr Ander Slade.  Primary Cardiologist: Dr Aundra Dubin   HPI: [name] is a 80 y.o. with a history of GERD, pulmonary hypertension, chronic respiratory failure on 8 liters home oxygen.   Admitted 08/2020 with increased dyspnea in the setting of pulmonary hypertension. Required HFNC. Echo in 2021 showed normal LVEF 60 to 65% with elevated RVSP of 60 mmHg.  Echo repeated 5/22 and showed LVEF 60 to 65% but an increase in RVSP to 73 mmHg.  HF team consulted and had RHC that showed low filling pressures and PVR 7.6 WU. Started on macitentan and tadalafil.  CT chest in 5/22 showed no ILD, mild emphysema.  V/Q in 9/21 showed no chronic PE.  She was unable to perform PFTs.   She returns today for followup of pulmonary hypertension.  She is using home oxygen 8L.  She feels like her breathing has worsened gradually.  Starting tadalafil did not help and may have worsened her symptoms.  Generally ok around the house but short of breath after walking 50 feet. No orthopnea/PND.  No lightheadedness/syncope.  She has been having "heartburn" after eating.  No exertional chest symptoms.  She is taking lansoprazole.   6 minute walk (7/22): 46 m  Labs (6/22): BNP 77, creatinine 0.67  PMH: 1. COPD: High resolution CT chest (5/22) with no evidence for ILD, mild emphysema.  2. HTN 3. 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  She required right carotid stent in 2013.  5. OSA: Mild.  6. Pulmonary hypertension: Echo (5/22) with EF 60-65%, mild LVH, moderate RV enlargement, mildly decreased RV systolic function, PASP 73 mmHg. - High resolution CT chest (5/22) with no evidence for ILD, mild emphysema.  - Unable to complete PFTs - RHC (5/22): mean RA 1, PA 72/13 mean 36, mean PCWP 1, CI 2.88, PVR 7.6 WU.  - V/Q scan (9/21) was not suggestive of chronic PEs and lower extremity venous dopplers with no DVT.   - HIV negative, ACE  level normal, anti-CCP negative, anti-Jo-1 negative, anti-SCL70 negative, ANCA negative, ANA negative, RF negative.    7. GERD  ROS: All systems negative except as listed in HPI, PMH and Problem List.  SH:  Social History   Socioeconomic History   Marital status: Single    Spouse name: Not on file   Number of children: Not on file   Years of education: Not on file   Highest education level: Not on file  Occupational History   Not on file  Tobacco Use   Smoking status: Former    Packs/day: 0.30    Years: 5.00    Pack years: 1.50    Types: Cigarettes    Quit date: 04/12/1980    Years since quitting: 40.5   Smokeless tobacco: Never  Vaping Use   Vaping Use: Never used  Substance and Sexual Activity   Alcohol use: No   Drug use: No   Sexual activity: Not Currently  Other Topics Concern   Not on file  Social History Narrative   Not on file   Social Determinants of Health   Financial Resource Strain: Low Risk    Difficulty of Paying Living Expenses: Not very hard  Food Insecurity: No Food Insecurity   Worried About Running Out of Food in the Last Year: Never true   Ran Out of Food in the Last Year: Never true  Transportation Needs: No Transportation Needs   Lack of Transportation (  Medical): No   Lack of Transportation (Non-Medical): No  Physical Activity: Not on file  Stress: Not on file  Social Connections: Not on file  Intimate Partner Violence: Not on file    FH:  Family History  Problem Relation Age of Onset   Heart failure Mother    Cancer Father    Breast cancer Neg Hx     Current Outpatient Medications  Medication Sig Dispense Refill   acetaminophen (TYLENOL) 325 MG tablet Take 650 mg every 6 (six) hours as needed by mouth for mild pain or headache.      ADVAIR HFA 115-21 MCG/ACT inhaler Inhale 2 puffs into the lungs 2 (two) times daily.     albuterol (PROVENTIL) (2.5 MG/3ML) 0.083% nebulizer solution Take 3 mLs (2.5 mg total) by nebulization every 6  (six) hours as needed for wheezing or shortness of breath. 360 mL 11   albuterol (VENTOLIN HFA) 108 (90 Base) MCG/ACT inhaler Inhale 2 puffs into the lungs every 6 (six) hours as needed for wheezing or shortness of breath. 8 g 12   amLODipine (NORVASC) 5 MG tablet TAKE 1 TABLET BY MOUTH EVERY DAY 90 tablet 0   arformoterol (BROVANA) 15 MCG/2ML NEBU Take 2 mLs (15 mcg total) by nebulization 2 (two) times daily. 120 mL 11   aspirin 81 MG chewable tablet Chew 81 mg by mouth daily.     budesonide (PULMICORT) 0.5 MG/2ML nebulizer solution Take 2 mLs (0.5 mg total) by nebulization in the morning and at bedtime. 120 mL 11   calcium-vitamin D (OSCAL WITH D) 500-200 MG-UNIT per tablet Take 1 tablet by mouth daily with breakfast.     cetirizine (ZYRTEC) 10 MG tablet Take 10 mg daily as needed by mouth for allergies.      clopidogrel (PLAVIX) 75 MG tablet TAKE 1 TABLET BY MOUTH EVERY DAY 30 tablet 2   docusate sodium (COLACE) 100 MG capsule TAKE 1 CAPSULE BY MOUTH EVERY DAY AS NEEDED FOR MILD CONSTIPATION 30 capsule 2   fluticasone (FLONASE) 50 MCG/ACT nasal spray Place 1 spray into both nostrils daily as needed for allergies or rhinitis (for sinus congestion). 48 mL 3   furosemide (LASIX) 20 MG tablet 1 tab PO every other day 30 tablet 1   gabapentin (NEURONTIN) 100 MG capsule TAKE 1 CAPSULE (100 MG TOTAL) BY MOUTH AT BEDTIME. 90 capsule 1   guaiFENesin-dextromethorphan (ROBITUSSIN DM) 100-10 MG/5ML syrup Take 10 mLs by mouth every 4 (four) hours as needed for cough. 118 mL 0   lansoprazole (PREVACID) 15 MG capsule Take 15 mg by mouth daily.     latanoprost (XALATAN) 0.005 % ophthalmic solution Place 1 drop at bedtime into both eyes.     levothyroxine (SYNTHROID) 50 MCG tablet Take 1.5 tablets (75 mcg total) by mouth daily. 45 tablet 1   lisinopril (ZESTRIL) 20 MG tablet TAKE 1 TABLET BY MOUTH EVERY DAY 30 tablet 2   loratadine (CLARITIN) 10 MG tablet Take 10 mg by mouth daily.     macitentan (OPSUMIT) 10  MG tablet Take 1 tablet (10 mg total) by mouth daily. This medicine will be mailed to your house.     metoprolol succinate (TOPROL-XL) 25 MG 24 hr tablet TAKE 1/2 TABLET BY MOUTH EVERY DAY 45 tablet 1   Multiple Vitamins-Minerals (ALIVE WOMENS ENERGY) TABS Take 1 tablet by mouth daily.     potassium chloride SA (KLOR-CON) 20 MEQ tablet Take 2 tablets (40 mEq total) by mouth daily. 60 tablet 3  Probiotic Product (PROBIOTIC PO) Take 1 capsule by mouth daily.     simvastatin (ZOCOR) 20 MG tablet TAKE 1 TABLET BY MOUTH EVERYDAY AT BEDTIME 90 tablet 2   No current facility-administered medications for this encounter.    Vitals:   11/03/20 1023  Weight: 56.6 kg (124 lb 12.8 oz)   PHYSICAL EXAM: General: NAD, frail. Wearing oxygen.  Neck: No JVD, no thyromegaly or thyroid nodule.  Lungs: Decreased BS bilaterally.  CV: Nondisplaced PMI.  Heart regular S1/S2, no S3/S4, no murmur.  No peripheral edema.  No carotid bruit.  Normal pedal pulses.  Abdomen: Soft, nontender, no hepatosplenomegaly, no distention.  Skin: Intact without lesions or rashes.  Neurologic: Alert and oriented x 3.  Psych: Normal affect. Extremities: No clubbing or cyanosis.  HEENT: Normal.    ASSESSMENT & PLAN: 1. Pulmonary hypertension:  Echo (5/22) with EF 60-65%, mild LVH, moderate RV enlargement, mildly decreased RV systolic function, PASP 73 mmHg. High resolution CT chest with no evidence for ILD, mild emphysema, mediastinal LAN less prominent than on prior CT and likely reactive. She had a V/Q scan in 9/21 that was not suggestive of chronic PEs and lower extremity venous dopplers with no DVT.  Unable to obtain PFTs.  She has mild OSA.  Autoimmune serologies negative.  RHC in 5/22 with PAH, PVR 7.6 WU.  Cause of PAH is uncertain, concerned for component of group 1 PH as mild OSA and mild COPD did not appear to explain.  There has been some concern that she could have sarcoidosis, but CT chest is not suggestive of this.  She  has not responded well to macitentan and tadalafil, still with high oxygen requirement and feels worse on tadalafil.  I am concerned that the COPD/emphysema is worse than suggested by the 5/22 CT and that she could have predominantly group 3 PH. Volume status looks stable. 6 minute walk today was very poor.  - Stop tadalafil.  I will start her on Tyvaso DPI.  This may be a better med for her as it is inhaled and can limit worsening V/Q mismatching.  If she does not improve symptomatically with it, will stop.  - Continue macitentan for now.  - Continue Lasix 20 mg every other day, BMET/BNP today.  - Treatment of OSA and COPD per pulmonary. - ?any further workup of sarcoidosis => per pulmnoary - Pulmonary rehab may be helpful.  2. COPD: Mild based on CT chest but concerned it may actually be more significant.  Unable to perform PFTs.  3. OSA: Mild. 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 - On ASA/Plavix and Zocor.   Followup in 6 wks.   Loralie Champagne 11/03/2020

## 2020-11-03 NOTE — Patient Instructions (Addendum)
6 minute walk test done today.   Labs done today. We will contact you only if your labs are abnormal.  STOP taking Tadalafil  We will be in contact with you regarding starting Tyvaso   No other medication changes were made. Please continue all current medications as prescribed.  Your physician recommends that you schedule a follow-up appointment in: 6 weeks with Dr. Aundra Dubin  If you have any questions or concerns before your next appointment please send [place] a message through [facility] or call our office at [phone].    TO LEAVE A MESSAGE FOR THE NURSE SELECT OPTION 2, PLEASE LEAVE A MESSAGE INCLUDING: YOUR NAME DATE OF BIRTH CALL BACK NUMBER REASON FOR CALL**this is important as we prioritize the call backs  YOU WILL RECEIVE A CALL BACK THE SAME DAY AS LONG AS YOU CALL BEFORE 4:00 PM   Do the following things EVERYDAY: Weigh yourself in the morning before breakfast. Write it down and keep it in a log. Take your medicines as prescribed Eat low salt foods--Limit salt (sodium) to 2000 mg per day.  Stay as active as you can everyday Limit all fluids for the day to less than 2 liters   At the [facility], you and your health needs are our priority. As part of our continuing mission to provide you with exceptional heart care, we have created designated Provider Care Teams. These Care Teams include your primary Cardiologist (physician) and Advanced Practice Providers (APPs- Physician Assistants and Nurse Practitioners) who all work together to provide you with the care you need, when you need it.   You may see any of the following providers on your designated Care Team at your next follow up: Dr Glori Bickers Dr Haynes Kerns, NP Lyda Jester, [place] Audry Riles, PharmD   Please be sure to bring in all your medications bottles to every appointment.

## 2020-11-04 ENCOUNTER — Emergency Department (HOSPITAL_COMMUNITY)
Admission: EM | Admit: 2020-11-04 | Discharge: 2020-11-05 | Disposition: A | Discharge status: Home / Self Care | Payer: Medicare Other | Attending: Emergency Medicine | Admitting: Emergency Medicine

## 2020-11-04 DIAGNOSIS — Z87891 Personal history of nicotine dependence: Secondary | ICD-10-CM | POA: Diagnosis not present

## 2020-11-04 DIAGNOSIS — E039 Hypothyroidism, unspecified: Secondary | ICD-10-CM | POA: Insufficient documentation

## 2020-11-04 DIAGNOSIS — J449 Chronic obstructive pulmonary disease, unspecified: Secondary | ICD-10-CM | POA: Insufficient documentation

## 2020-11-04 DIAGNOSIS — Z9981 Dependence on supplemental oxygen: Secondary | ICD-10-CM | POA: Diagnosis not present

## 2020-11-04 DIAGNOSIS — I10 Essential (primary) hypertension: Secondary | ICD-10-CM | POA: Diagnosis not present

## 2020-11-04 DIAGNOSIS — Z79899 Other long term (current) drug therapy: Secondary | ICD-10-CM | POA: Insufficient documentation

## 2020-11-04 DIAGNOSIS — Z8616 Personal history of COVID-19: Secondary | ICD-10-CM | POA: Insufficient documentation

## 2020-11-04 DIAGNOSIS — Z7982 Long term (current) use of aspirin: Secondary | ICD-10-CM | POA: Insufficient documentation
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
## 2020-11-04 NOTE — ED Notes (Signed)
Pt moved to Yellow zone for holding for Oxygen need. Pt not having any pain nor medical complaints at this time. Family at bedside. Warm blanket provided with water.
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
## 2020-11-14 ENCOUNTER — Other Ambulatory Visit: Payer: Self-pay

## 2020-11-14 ENCOUNTER — Ambulatory Visit (INDEPENDENT_AMBULATORY_CARE_PROVIDER_SITE_OTHER): Payer: Medicare Other | Admitting: Pulmonary Disease

## 2020-11-14 ENCOUNTER — Encounter: Payer: Self-pay | Admitting: Pulmonary Disease

## 2020-11-14 VITALS — BP 116/62 | HR 84 | Temp 98.0°F | Ht 63.0 in | Wt 124.8 lb

## 2020-11-14 DIAGNOSIS — I272 Pulmonary hypertension, unspecified: Secondary | ICD-10-CM

## 2020-11-14 DIAGNOSIS — J9611 Chronic respiratory failure with hypoxia: Secondary | ICD-10-CM

## 2020-11-14 MED ORDER — PREDNISONE 5 MG PO TABS: 5.0000 mg | ORAL_TABLET | Freq: Every day | ORAL | 3 refills | Status: DC

## 2020-11-14 NOTE — Patient Instructions (Signed)
Continue oxygen supplementation  Prescription for prednisone sent into pharmacy 5 mg daily, may try 5 mg every other day to even reduce the side effect profile better  Goal is to use the lowest dose possible to reap some benefit and avoid side effects  I will see you in 3 months  Call with significant concerns  If any of the breathing medications specifically makes you feel worse after using it, stop using it for a while to see whether it is definitely needed

## 2020-11-14 NOTE — Progress Notes (Signed)
_0  ID: [name], female    DOB: 11-May-1940, 80 y.o.   MRN: [id number]  Chief Complaint  Patient presents with   Follow-up    Breathing rough in the afternoons, and in the morning. Nasal drainage     Referring provider: [name], MD  HPI:  Daughters present for the evaluation  No significant change in status Discussion regarding use of steroids as she felt much better on low doses of steroids Currently on medications for pulmonary hypertension, follows up with cardiology  Was recently hospitalized in May for increased oxygen requirement, shortness of breath    Still with significant shortness of breath  Has been using nebulization treatments with Brovana and Pulmicort Oxygen saturations have been fair  Remains on 8 L of oxygen, they do adjust as needed to keep saturations above 90, sometimes requires increased oxygen supplementation for ambulation  Limited with activities of daily living  Difficulty with ambulating generally Recent 6-minute walk noted  She does not feel acutely ill She has a cough, does not really bringing up any significant secretions No fever, no chills  Medication adjustment by cardiology noted-macitentan and Tyvaso.  Tadalafil was discontinued  TEST/EVENTS :  RHC in 5/22 with PAH, PVR 7.6 WU  CT chest December 25, 2019 spiculated 7 mm right upper lobe nodule, 5 mm pulmonary nodule posteriorly, micronodules along the right upper lobe.  Left upper lobe groundglass density  VQ scan December 27, 2019 low probability for PE  2D echo December 26, 2019 EF 9476%, RV systolic function normal.  RV size normal.  Moderately elevated pulmonary artery systolic pressure 60 mmHg.  Venous Doppler negative for DVT  She did have a sleep study a couple of nights ago that I reviewed not showing any significant sleep disordered breathing  10/26 follow up : PNA , O2 RF, pulmonary hypertension Patient presents for a post hospital follow-up.   Patient was recently seen in the hospital for pulmonary consult for acute respiratory distress, shortness of breath hypoxia and CT chest with scattered lung nodules.  She was treated for suspected pneumonia with antibiotics and steroids.  Started on oxygen at discharge at 3 L.  Patient CT chest showed scattered nodules in the right upper lobe and left upper lobe.  Patient is a former smoker. Patient says she is retired.  She lives at home and is active.  She noticed around June of this year that she started having some worsening shortness of breath with activities.  Around August symptoms progressively worsened.  She has no significant cough or leg swelling.  No orthopnea. 2D echo during hospitalization showed pulmonary hypertension.. She does have some restless sleep and daytime sleepiness.  BMI is 22.  Patient is a former smoker. Autoimmune testing was negative except for sed rate was elevated at 46  weight is down approximately 10 pounds over the last year I responded to epic chart 1  Allergies  Allergen Reactions   Meclizine Nausea And Vomiting     There is no immunization history on file for this patient.  Past Medical History:  Diagnosis Date   Allergy    Arthritis    Carotid artery occlusion    COPD (chronic obstructive pulmonary disease) (HCC)    GERD (gastroesophageal reflux disease)    Hypertension    Joint pain    Thyroid disease    Tobacco History: Social History   Tobacco Use  Smoking Status Former   Packs/day: 0.30   Years: 5.00  Pack years: 1.50   Types: Cigarettes   Quit date: 04/12/1980   Years since quitting: 40.6  Smokeless Tobacco Never   Counseling given: Not Answered  Outpatient Medications Prior to Visit  Medication Sig Dispense Refill   acetaminophen (TYLENOL) 325 MG tablet Take 650 mg every 6 (six) hours as needed by mouth for mild pain or headache.      ADVAIR HFA 115-21 MCG/ACT inhaler Inhale 2 puffs into the lungs 2 (two) times daily.      albuterol (PROVENTIL) (2.5 MG/3ML) 0.083% nebulizer solution Take 3 mLs (2.5 mg total) by nebulization every 6 (six) hours as needed for wheezing or shortness of breath. 360 mL 11   albuterol (VENTOLIN HFA) 108 (90 Base) MCG/ACT inhaler Inhale 2 puffs into the lungs every 6 (six) hours as needed for wheezing or shortness of breath. 8 g 12   amLODipine (NORVASC) 5 MG tablet TAKE 1 TABLET BY MOUTH EVERY DAY 90 tablet 0   arformoterol (BROVANA) 15 MCG/2ML NEBU Take 2 mLs (15 mcg total) by nebulization 2 (two) times daily. 120 mL 11   aspirin 81 MG chewable tablet Chew 81 mg by mouth daily.     budesonide (PULMICORT) 0.5 MG/2ML nebulizer solution Take 2 mLs (0.5 mg total) by nebulization in the morning and at bedtime. 120 mL 11   calcium-vitamin D (OSCAL WITH D) 500-200 MG-UNIT per tablet Take 1 tablet by mouth daily with breakfast.     cetirizine (ZYRTEC) 10 MG tablet Take 10 mg daily as needed by mouth for allergies.      clopidogrel (PLAVIX) 75 MG tablet TAKE 1 TABLET BY MOUTH EVERY DAY 30 tablet 2   docusate sodium (COLACE) 100 MG capsule TAKE 1 CAPSULE BY MOUTH EVERY DAY AS NEEDED FOR MILD CONSTIPATION 30 capsule 2   fluticasone (FLONASE) 50 MCG/ACT nasal spray Place 1 spray into both nostrils daily as needed for allergies or rhinitis (for sinus congestion). 48 mL 3   furosemide (LASIX) 20 MG tablet 1 tab PO every other day 30 tablet 1   gabapentin (NEURONTIN) 100 MG capsule TAKE 1 CAPSULE (100 MG TOTAL) BY MOUTH AT BEDTIME. 90 capsule 1   guaiFENesin-dextromethorphan (ROBITUSSIN DM) 100-10 MG/5ML syrup Take 10 mLs by mouth every 4 (four) hours as needed for cough. 118 mL 0   lansoprazole (PREVACID) 15 MG capsule Take 15 mg by mouth daily.     latanoprost (XALATAN) 0.005 % ophthalmic solution Place 1 drop at bedtime into both eyes.     levothyroxine (SYNTHROID) 50 MCG tablet Take 1.5 tablets (75 mcg total) by mouth daily. 45 tablet 1   lisinopril (ZESTRIL) 20 MG tablet TAKE 1 TABLET BY MOUTH EVERY  DAY 30 tablet 2   loratadine (CLARITIN) 10 MG tablet Take 10 mg by mouth daily.     macitentan (OPSUMIT) 10 MG tablet Take 1 tablet (10 mg total) by mouth daily. This medicine will be mailed to your house.     metoprolol succinate (TOPROL-XL) 25 MG 24 hr tablet TAKE 1/2 TABLET BY MOUTH EVERY DAY 45 tablet 1   Multiple Vitamins-Minerals (ALIVE WOMENS ENERGY) TABS Take 1 tablet by mouth daily.     potassium chloride SA (KLOR-CON) 20 MEQ tablet Take 2 tablets (40 mEq total) by mouth daily. 60 tablet 3   Probiotic Product (PROBIOTIC PO) Take 1 capsule by mouth daily.     simvastatin (ZOCOR) 20 MG tablet TAKE 1 TABLET BY MOUTH EVERYDAY AT BEDTIME 90 tablet 2   No facility-administered medications  prior to visit.     Review of Systems:  Shortness of breath Congestion Shortness of breath with activity Denies any chest pains or chest discomfort   Physical Exam  Elderly lady, does not appear to be in distress, wheelchair borne moist oral mucosa S1-S2 appreciated with no murmur Clear breath sounds bilaterally, decreased at the bases Mild peripheral edema  Lab Results:  CBC    Component Value Date/Time   WBC 16.5 (H) 09/06/2020 0038   RBC 4.16 09/06/2020 0038   HGB 13.0 09/06/2020 0038   HGB 13.3 01/15/2020 1217   HCT 38.9 09/06/2020 0038   HCT 41.9 01/15/2020 1217   PLT 225 09/06/2020 0038   PLT 224 01/15/2020 1217   MCV 93.5 09/06/2020 0038   MCV 94 01/15/2020 1217   MCH 31.3 09/06/2020 0038   MCHC 33.4 09/06/2020 0038   RDW 13.9 09/06/2020 0038   RDW 14.5 01/15/2020 1217   LYMPHSABS 1.8 08/27/2020 1451   MONOABS 0.1 08/27/2020 1451   EOSABS 0.0 08/27/2020 1451   BASOSABS 0.1 08/27/2020 1451    BMET    Component Value Date/Time   NA 141 11/03/2020 1054   NA 142 01/15/2020 1217   K 3.7 11/03/2020 1054   CL 111 11/03/2020 1054   CO2 21 (L) 11/03/2020 1054   GLUCOSE 116 (H) 11/03/2020 1054   BUN 7 (L) 11/03/2020 1054   BUN 10 01/15/2020 1217   CREATININE 0.67  11/03/2020 1054   CALCIUM 9.7 11/03/2020 1054   CALCIUM 7.3 (L) 04/18/2008 1710   GFRNONAA >60 11/03/2020 1054   GFRAA 86 01/15/2020 1217    BNP    Component Value Date/Time   BNP 102.4 (H) 11/03/2020 1054   Imaging:  CT chest reviewed with patient and her daughter  Assessment & Plan:  .  Patient with scattered lung nodules on CT  .  No PFT on record as patient was unable to perform PFT  .  Pulmonary hypertension on macitentan and Tyvaso  Inhaler use technique reviewed .  Inhaler technique is for .  We will continue to use a spacer with MDI  Chronic respiratory failure .  Continue oxygen supplementation .  Maintain saturations greater than 90%  .  Graded exercise as tolerated  .  Prednisone 5 mg daily, cut down to 5 mg every other day if tolerated -Side effect profile of steroid use discussed extensively with patient and daughters  .  Encouraged to call with significant concerns  .  Tentative follow-up in 3 months Laurin Coder, MD 11/14/2020

## 2020-11-17 ENCOUNTER — Other Ambulatory Visit: Payer: Self-pay | Admitting: Internal Medicine

## 2020-11-17 DIAGNOSIS — R0600 Dyspnea, unspecified: Secondary | ICD-10-CM

## 2020-11-17 DIAGNOSIS — I10 Essential (primary) hypertension: Secondary | ICD-10-CM

## 2020-11-17 DIAGNOSIS — J9611 Chronic respiratory failure with hypoxia: Secondary | ICD-10-CM

## 2020-11-17 NOTE — Telephone Encounter (Signed)
Medication Refill - Medication: lisinopril (ZESTRIL) 20 MG tablet   Has the patient contacted their pharmacy? No.  Preferred Pharmacy (with phone number or street name):  [id number] [place] [address], [id number] Phone:  [phone] Fax:  [phone]  Agent: Please be advised that RX refills may take up to 3 business days. We ask that you follow-up with your pharmacy.

## 2020-11-17 NOTE — Telephone Encounter (Signed)
  Notes to clinic:  Medication filled by a historical provider  Review for continued use and refill    Requested Prescriptions  Pending Prescriptions Disp Refills   ADVAIR HFA 115-21 MCG/ACT inhaler [Pharmacy Med Name: ADVAIR HFA 115-21 MCG INHALER] 12 each 4    Sig: INHALE 2 PUFFS INTO THE LUNGS TWICE A DAY      Pulmonology:  Combination Products Passed - 11/17/2020 12:33 AM      Passed - Valid encounter within last 12 months    Recent Outpatient Visits           2 months ago Hospital discharge follow-up   [place], MD   4 months ago Hypoxemic respiratory failure, chronic [facility])   [place], Deborah B, MD   6 months ago Hospital discharge follow-up   [place], MD   10 months ago Hospital discharge follow-up   [place], Deborah B, MD   1 year ago Essential hypertension   [place], MD       Future Appointments             In 1 week [name], MD [place]

## 2020-11-17 NOTE — Telephone Encounter (Signed)
  Notes to clinic:  Review for refill Medication last filled by historical provider   Requested Prescriptions  Pending Prescriptions Disp Refills   lansoprazole (PREVACID) 15 MG capsule      Sig: Take 1 capsule (15 mg total) by mouth daily.      There is no refill protocol information for this order

## 2020-11-18 MED ORDER — LANSOPRAZOLE 15 MG PO CPDR: 15.0000 mg | DELAYED_RELEASE_CAPSULE | Freq: Every day | ORAL | 2 refills | Status: AC

## 2020-11-19 ENCOUNTER — Telehealth: Payer: Self-pay | Admitting: Internal Medicine

## 2020-11-19 NOTE — Telephone Encounter (Signed)
Would like to have Claritin sent in as a prescription due to cost when purchasing OTC.  Please advise.

## 2020-11-19 NOTE — Telephone Encounter (Signed)
Copied from [place] [phone]. Topic: General - Other >> Nov 17, 2020  8:08 AM Valere Dross wrote: Reason for CRM: Pt called in stating this medication is not making her feel good, and wants to see about doing something different. Please advise >> Nov 17, 2020  8:09 AM Holley Dexter N wrote: loratadine (CLARITIN) 10 MG tablet

## 2020-11-20 MED ORDER — LORATADINE 10 MG PO TABS: 10.0000 mg | ORAL_TABLET | Freq: Every day | ORAL | 2 refills | Status: AC | PRN

## 2020-11-20 NOTE — Telephone Encounter (Signed)
Patient notified.  

## 2020-11-22 ENCOUNTER — Inpatient Hospital Stay (HOSPITAL_COMMUNITY)
Admission: EM | Admit: 2020-11-22 | Discharge: 2020-11-29 | DRG: 193 | Disposition: A | Discharge status: Hospice | Payer: Medicare Other | Attending: Internal Medicine | Admitting: Internal Medicine

## 2020-11-22 ENCOUNTER — Emergency Department (HOSPITAL_COMMUNITY): Payer: Medicare Other

## 2020-11-22 DIAGNOSIS — Z8616 Personal history of COVID-19: Secondary | ICD-10-CM

## 2020-11-22 DIAGNOSIS — M1990 Unspecified osteoarthritis, unspecified site: Secondary | ICD-10-CM | POA: Diagnosis present

## 2020-11-22 DIAGNOSIS — R54 Age-related physical debility: Secondary | ICD-10-CM | POA: Diagnosis present

## 2020-11-22 DIAGNOSIS — J189 Pneumonia, unspecified organism: Principal | ICD-10-CM | POA: Diagnosis present

## 2020-11-22 DIAGNOSIS — Z90710 Acquired absence of both cervix and uterus: Secondary | ICD-10-CM

## 2020-11-22 DIAGNOSIS — J9621 Acute and chronic respiratory failure with hypoxia: Secondary | ICD-10-CM | POA: Diagnosis present

## 2020-11-22 DIAGNOSIS — R0902 Hypoxemia: Secondary | ICD-10-CM | POA: Diagnosis not present

## 2020-11-22 DIAGNOSIS — I6529 Occlusion and stenosis of unspecified carotid artery: Secondary | ICD-10-CM | POA: Diagnosis present

## 2020-11-22 DIAGNOSIS — Z28310 Unvaccinated for covid-19: Secondary | ICD-10-CM

## 2020-11-22 DIAGNOSIS — E039 Hypothyroidism, unspecified: Secondary | ICD-10-CM | POA: Diagnosis present

## 2020-11-22 DIAGNOSIS — Z20822 Contact with and (suspected) exposure to covid-19: Secondary | ICD-10-CM | POA: Diagnosis present

## 2020-11-22 DIAGNOSIS — J300 Vasomotor rhinitis: Secondary | ICD-10-CM | POA: Diagnosis present

## 2020-11-22 DIAGNOSIS — Z79890 Hormone replacement therapy (postmenopausal): Secondary | ICD-10-CM

## 2020-11-22 DIAGNOSIS — G4733 Obstructive sleep apnea (adult) (pediatric): Secondary | ICD-10-CM | POA: Diagnosis present

## 2020-11-22 DIAGNOSIS — Z9981 Dependence on supplemental oxygen: Secondary | ICD-10-CM

## 2020-11-22 DIAGNOSIS — Z66 Do not resuscitate: Secondary | ICD-10-CM | POA: Diagnosis not present

## 2020-11-22 DIAGNOSIS — J80 Acute respiratory distress syndrome: Secondary | ICD-10-CM | POA: Diagnosis not present

## 2020-11-22 DIAGNOSIS — Z8249 Family history of ischemic heart disease and other diseases of the circulatory system: Secondary | ICD-10-CM

## 2020-11-22 DIAGNOSIS — I272 Pulmonary hypertension, unspecified: Secondary | ICD-10-CM | POA: Diagnosis present

## 2020-11-22 DIAGNOSIS — K219 Gastro-esophageal reflux disease without esophagitis: Secondary | ICD-10-CM | POA: Diagnosis present

## 2020-11-22 DIAGNOSIS — R0602 Shortness of breath: Secondary | ICD-10-CM | POA: Diagnosis not present

## 2020-11-22 DIAGNOSIS — I739 Peripheral vascular disease, unspecified: Secondary | ICD-10-CM | POA: Diagnosis present

## 2020-11-22 DIAGNOSIS — Z888 Allergy status to other drugs, medicaments and biological substances status: Secondary | ICD-10-CM

## 2020-11-22 DIAGNOSIS — R5381 Other malaise: Secondary | ICD-10-CM | POA: Diagnosis not present

## 2020-11-22 DIAGNOSIS — Z993 Dependence on wheelchair: Secondary | ICD-10-CM

## 2020-11-22 DIAGNOSIS — Z87891 Personal history of nicotine dependence: Secondary | ICD-10-CM

## 2020-11-22 DIAGNOSIS — R5383 Other fatigue: Secondary | ICD-10-CM | POA: Diagnosis not present

## 2020-11-22 DIAGNOSIS — H409 Unspecified glaucoma: Secondary | ICD-10-CM | POA: Diagnosis present

## 2020-11-22 DIAGNOSIS — Z86718 Personal history of other venous thrombosis and embolism: Secondary | ICD-10-CM

## 2020-11-22 DIAGNOSIS — Z7902 Long term (current) use of antithrombotics/antiplatelets: Secondary | ICD-10-CM

## 2020-11-22 DIAGNOSIS — Z79899 Other long term (current) drug therapy: Secondary | ICD-10-CM

## 2020-11-22 DIAGNOSIS — I10 Essential (primary) hypertension: Secondary | ICD-10-CM | POA: Diagnosis present

## 2020-11-22 DIAGNOSIS — Z7952 Long term (current) use of systemic steroids: Secondary | ICD-10-CM

## 2020-11-22 DIAGNOSIS — Z7982 Long term (current) use of aspirin: Secondary | ICD-10-CM

## 2020-11-22 DIAGNOSIS — J9811 Atelectasis: Secondary | ICD-10-CM | POA: Diagnosis not present

## 2020-11-22 DIAGNOSIS — J432 Centrilobular emphysema: Secondary | ICD-10-CM | POA: Diagnosis present

## 2020-11-22 DIAGNOSIS — Z809 Family history of malignant neoplasm, unspecified: Secondary | ICD-10-CM

## 2020-11-22 HISTORY — DX: Vasomotor rhinitis: J30.0

## 2020-11-22 HISTORY — DX: Pulmonary hypertension, unspecified: I27.20

## 2020-11-22 HISTORY — DX: Hypothyroidism, unspecified: E03.9

## 2020-11-22 HISTORY — DX: Unspecified glaucoma: H40.9

## 2020-11-22 HISTORY — DX: Chronic respiratory failure with hypoxia: J96.11

## 2020-11-22 LAB — CBC WITH DIFFERENTIAL/PLATELET
Abs Immature Granulocytes: 0.09 10*3/uL — ABNORMAL HIGH (ref 0.00–0.07)
Basophils Absolute: 0 10*3/uL (ref 0.0–0.1)
Basophils Relative: 0 %
Eosinophils Absolute: 0.1 10*3/uL (ref 0.0–0.5)
Eosinophils Relative: 0 %
HCT: 37.7 % (ref 36.0–46.0)
Hemoglobin: 11.5 g/dL — ABNORMAL LOW (ref 12.0–15.0)
Immature Granulocytes: 1 %
Lymphocytes Relative: 13 %
Lymphs Abs: 2 10*3/uL (ref 0.7–4.0)
MCH: 29.7 pg (ref 26.0–34.0)
MCHC: 30.5 g/dL (ref 30.0–36.0)
MCV: 97.4 fL (ref 80.0–100.0)
Monocytes Absolute: 0.7 10*3/uL (ref 0.1–1.0)
Monocytes Relative: 5 %
Neutro Abs: 12.6 10*3/uL — ABNORMAL HIGH (ref 1.7–7.7)
Neutrophils Relative %: 81 %
Platelets: 255 10*3/uL (ref 150–400)
RBC: 3.87 MIL/uL (ref 3.87–5.11)
RDW: 14.7 % (ref 11.5–15.5)
WBC: 15.5 10*3/uL — ABNORMAL HIGH (ref 4.0–10.5)
nRBC: 0 % (ref 0.0–0.2)

## 2020-11-22 LAB — BASIC METABOLIC PANEL
Anion gap: 10 (ref 5–15)
BUN: 11 mg/dL (ref 8–23)
CO2: 22 mmol/L (ref 22–32)
Calcium: 9.3 mg/dL (ref 8.9–10.3)
Chloride: 109 mmol/L (ref 98–111)
Creatinine, Ser: 0.75 mg/dL (ref 0.44–1.00)
GFR, Estimated: >60 mL/min (ref >60)
Glucose, Bld: 139 mg/dL — ABNORMAL HIGH (ref 70–99)
Potassium: 4.1 mmol/L (ref 3.5–5.1)
Sodium: 141 mmol/L (ref 135–145)

## 2020-11-22 LAB — BRAIN NATRIURETIC PEPTIDE: B Natriuretic Peptide: 105.7 pg/mL — ABNORMAL HIGH (ref 0.0–100.0)

## 2020-11-22 NOTE — ED Triage Notes (Signed)
Pt here from home with c/o sob , pt is on 8-10 liters of O2 n/c at home was only on 2 liter per ptar , sats in the 60's , pt placed on NRB , sats 98 % on arrival

## 2020-11-22 NOTE — ED Provider Notes (Signed)
Emergency Medicine Provider Triage Evaluation Note  [name] , a 80 y.o. female  was evaluated in triage.  Pt complains of shortness of breath sounds, EMS called out family due to difficulty getting her sats up, when EMS arrived patient sats in the 60s but she was on 2 L nasal cannula, per chart review patient wears 8 L at baseline.  EMS applied nonrebreather and sats noted.  Patient still reports feeling little short of breath she is tachypneic arrival.  Denies cough or chest pain.  Does report some slight lower extremity swelling   Review of Systems  Positive: Shortness of breath, lower extremity swelling Negative: Chest pain, fever, cough  Physical Exam  BP (!) 148/55   Pulse (!) 119   Temp 98.2 F (36.8 C) (Oral)   Resp (!) 25   SpO2 98%  Gen:   Awake, no distress   Resp:  On nonrebreather patient still mildly tachypneic sats 98%, breath sounds slightly diminished, no wheezing, rales or rhonchi MSK:   Moves extremities without difficulty  Other:   Medical Decision Making  Medically screening exam initiated at 10:37 PM.  Appropriate orders placed.  Maudine MUSETTE KISAMORE was informed that the remainder of the evaluation will be completed by another provider, this initial triage assessment does not replace that evaluation, and the importance of remaining in the ED until their evaluation is complete.     Jacqlyn Larsen, PA-C 11/22/20 2249    Dorie Rank, MD 11/23/20 1336

## 2020-11-23 ENCOUNTER — Emergency Department (HOSPITAL_COMMUNITY): Payer: Medicare Other

## 2020-11-23 ENCOUNTER — Encounter (HOSPITAL_COMMUNITY): Payer: Self-pay | Admitting: Internal Medicine

## 2020-11-23 ENCOUNTER — Other Ambulatory Visit: Payer: Self-pay

## 2020-11-23 ENCOUNTER — Encounter: Payer: Self-pay | Admitting: Internal Medicine

## 2020-11-23 DIAGNOSIS — J9811 Atelectasis: Secondary | ICD-10-CM | POA: Diagnosis not present

## 2020-11-23 DIAGNOSIS — J432 Centrilobular emphysema: Secondary | ICD-10-CM | POA: Diagnosis present

## 2020-11-23 DIAGNOSIS — J189 Pneumonia, unspecified organism: Principal | ICD-10-CM

## 2020-11-23 DIAGNOSIS — R0902 Hypoxemia: Secondary | ICD-10-CM | POA: Diagnosis not present

## 2020-11-23 DIAGNOSIS — Z28310 Unvaccinated for covid-19: Secondary | ICD-10-CM | POA: Diagnosis not present

## 2020-11-23 DIAGNOSIS — J9621 Acute and chronic respiratory failure with hypoxia: Secondary | ICD-10-CM

## 2020-11-23 DIAGNOSIS — Z7902 Long term (current) use of antithrombotics/antiplatelets: Secondary | ICD-10-CM | POA: Diagnosis not present

## 2020-11-23 DIAGNOSIS — I10 Essential (primary) hypertension: Secondary | ICD-10-CM | POA: Diagnosis present

## 2020-11-23 DIAGNOSIS — J300 Vasomotor rhinitis: Secondary | ICD-10-CM | POA: Diagnosis present

## 2020-11-23 DIAGNOSIS — Z515 Encounter for palliative care: Secondary | ICD-10-CM | POA: Diagnosis not present

## 2020-11-23 DIAGNOSIS — Z8616 Personal history of COVID-19: Secondary | ICD-10-CM | POA: Diagnosis not present

## 2020-11-23 DIAGNOSIS — E039 Hypothyroidism, unspecified: Secondary | ICD-10-CM | POA: Diagnosis present

## 2020-11-23 DIAGNOSIS — Z7189 Other specified counseling: Secondary | ICD-10-CM | POA: Diagnosis not present

## 2020-11-23 DIAGNOSIS — Z7952 Long term (current) use of systemic steroids: Secondary | ICD-10-CM | POA: Diagnosis not present

## 2020-11-23 DIAGNOSIS — M1990 Unspecified osteoarthritis, unspecified site: Secondary | ICD-10-CM | POA: Diagnosis present

## 2020-11-23 DIAGNOSIS — I272 Pulmonary hypertension, unspecified: Secondary | ICD-10-CM | POA: Diagnosis present

## 2020-11-23 DIAGNOSIS — Z79899 Other long term (current) drug therapy: Secondary | ICD-10-CM | POA: Diagnosis not present

## 2020-11-23 DIAGNOSIS — Z7982 Long term (current) use of aspirin: Secondary | ICD-10-CM | POA: Diagnosis not present

## 2020-11-23 DIAGNOSIS — R54 Age-related physical debility: Secondary | ICD-10-CM | POA: Diagnosis present

## 2020-11-23 DIAGNOSIS — I6529 Occlusion and stenosis of unspecified carotid artery: Secondary | ICD-10-CM | POA: Diagnosis not present

## 2020-11-23 DIAGNOSIS — H409 Unspecified glaucoma: Secondary | ICD-10-CM | POA: Diagnosis present

## 2020-11-23 DIAGNOSIS — Z79890 Hormone replacement therapy (postmenopausal): Secondary | ICD-10-CM | POA: Diagnosis not present

## 2020-11-23 DIAGNOSIS — Z888 Allergy status to other drugs, medicaments and biological substances status: Secondary | ICD-10-CM | POA: Diagnosis not present

## 2020-11-23 DIAGNOSIS — G4733 Obstructive sleep apnea (adult) (pediatric): Secondary | ICD-10-CM | POA: Diagnosis present

## 2020-11-23 DIAGNOSIS — Z20822 Contact with and (suspected) exposure to covid-19: Secondary | ICD-10-CM | POA: Diagnosis present

## 2020-11-23 DIAGNOSIS — K219 Gastro-esophageal reflux disease without esophagitis: Secondary | ICD-10-CM | POA: Diagnosis present

## 2020-11-23 DIAGNOSIS — I739 Peripheral vascular disease, unspecified: Secondary | ICD-10-CM | POA: Diagnosis present

## 2020-11-23 DIAGNOSIS — Z66 Do not resuscitate: Secondary | ICD-10-CM | POA: Diagnosis not present

## 2020-11-23 DIAGNOSIS — R0602 Shortness of breath: Secondary | ICD-10-CM | POA: Diagnosis not present

## 2020-11-23 DIAGNOSIS — Z809 Family history of malignant neoplasm, unspecified: Secondary | ICD-10-CM | POA: Diagnosis not present

## 2020-11-23 LAB — D-DIMER, QUANTITATIVE: D-Dimer, Quant: 1.38 ug/mL-FEU — ABNORMAL HIGH (ref 0.00–0.50)

## 2020-11-23 LAB — HEPATIC FUNCTION PANEL
ALT: 35 U/L (ref 0–44)
AST: 34 U/L (ref 15–41)
Albumin: 2.7 g/dL — ABNORMAL LOW (ref 3.5–5.0)
Alkaline Phosphatase: 58 U/L (ref 38–126)
Bilirubin, Direct: 0.4 mg/dL — ABNORMAL HIGH (ref 0.0–0.2)
Indirect Bilirubin: 0.8 mg/dL (ref 0.3–0.9)
Total Bilirubin: 1.2 mg/dL (ref 0.3–1.2)
Total Protein: 6.5 g/dL (ref 6.5–8.1)

## 2020-11-23 LAB — RESP PANEL BY RT-PCR (FLU A&B, COVID) ARPGX2
Influenza A by PCR: NEGATIVE
Influenza B by PCR: NEGATIVE
SARS Coronavirus 2 by RT PCR: NEGATIVE

## 2020-11-23 MED ORDER — GUAIFENESIN-DM 100-10 MG/5ML PO SYRP: 10.0000 mL | ORAL_SOLUTION | ORAL | Status: DC | PRN

## 2020-11-23 MED ORDER — LEVOTHYROXINE SODIUM 75 MCG PO TABS: 75.0000 ug | ORAL_TABLET | Freq: Every day | ORAL | Status: DC

## 2020-11-23 MED ORDER — GABAPENTIN 100 MG PO CAPS
100.0000 mg | ORAL_CAPSULE | Freq: Every day | ORAL | Status: DC
  Administered 2020-11-23 – 2020-11-28 (×6): 100 mg via ORAL
  Filled 2020-11-23 (×6): qty 1

## 2020-11-23 MED ORDER — LATANOPROST 0.005 % OP SOLN
1.0000 [drp] | Freq: Every day | OPHTHALMIC | Status: DC
  Administered 2020-11-23 – 2020-11-27 (×5): 1 [drp] via OPHTHALMIC
  Filled 2020-11-23 (×2): qty 2.5

## 2020-11-23 MED ORDER — SIMVASTATIN 20 MG PO TABS
20.0000 mg | ORAL_TABLET | Freq: Every day | ORAL | Status: DC
  Administered 2020-11-23 – 2020-11-28 (×6): 20 mg via ORAL
  Filled 2020-11-23 (×6): qty 1

## 2020-11-23 MED ORDER — IOHEXOL 350 MG/ML SOLN: 75.0000 mL | Freq: Once | INTRAVENOUS | Status: DC | PRN

## 2020-11-23 MED ORDER — FUROSEMIDE 10 MG/ML IJ SOLN
40.0000 mg | Freq: Once | INTRAMUSCULAR | Status: AC
  Administered 2020-11-23: 40 mg via INTRAVENOUS
  Filled 2020-11-23: qty 4

## 2020-11-23 MED ORDER — LORATADINE 10 MG PO TABS: 10.0000 mg | ORAL_TABLET | Freq: Every day | ORAL | Status: DC | PRN

## 2020-11-23 MED ORDER — MORPHINE SULFATE (PF) 2 MG/ML IV SOLN
2.0000 mg | INTRAVENOUS | Status: DC | PRN
Start: 2020-11-23 — End: 2020-11-28

## 2020-11-23 MED ORDER — HYDRALAZINE HCL 20 MG/ML IJ SOLN: 5.0000 mg | INTRAMUSCULAR | Status: DC | PRN

## 2020-11-23 MED ORDER — DICLOFENAC SODIUM 1 % EX GEL
2.0000 g | CUTANEOUS | Status: DC | PRN
  Administered 2020-11-23: 2 g via TOPICAL
  Filled 2020-11-23: qty 100

## 2020-11-23 MED ORDER — METOPROLOL SUCCINATE ER 25 MG PO TB24
12.5000 mg | ORAL_TABLET | Freq: Every day | ORAL | Status: DC
  Administered 2020-11-23 – 2020-11-25 (×3): 12.5 mg via ORAL
  Filled 2020-11-23 (×5): qty 1

## 2020-11-23 MED ORDER — DOXYCYCLINE HYCLATE 100 MG PO TABS
100.0000 mg | ORAL_TABLET | Freq: Once | ORAL | Status: AC
  Administered 2020-11-23: 100 mg via ORAL
  Filled 2020-11-23: qty 1

## 2020-11-23 MED ORDER — ONDANSETRON HCL 4 MG/2ML IJ SOLN: 4.0000 mg | Freq: Four times a day (QID) | INTRAMUSCULAR | Status: DC | PRN

## 2020-11-23 MED ORDER — MACITENTAN 10 MG PO TABS
10.0000 mg | ORAL_TABLET | Freq: Every day | ORAL | Status: DC
  Administered 2020-11-23: 10 mg via ORAL
  Filled 2020-11-23 (×2): qty 1

## 2020-11-23 MED ORDER — LISINOPRIL 20 MG PO TABS
20.0000 mg | ORAL_TABLET | Freq: Every day | ORAL | Status: DC
  Administered 2020-11-24: 20 mg via ORAL
  Filled 2020-11-23: qty 1

## 2020-11-23 MED ORDER — ALBUTEROL SULFATE (2.5 MG/3ML) 0.083% IN NEBU: 2.5000 mg | INHALATION_SOLUTION | RESPIRATORY_TRACT | Status: DC | PRN

## 2020-11-23 MED ORDER — DOCUSATE SODIUM 100 MG PO CAPS
100.0000 mg | ORAL_CAPSULE | Freq: Two times a day (BID) | ORAL | Status: DC
  Administered 2020-11-23 – 2020-11-28 (×11): 100 mg via ORAL
  Filled 2020-11-23 (×13): qty 1

## 2020-11-23 MED ORDER — PREDNISONE 20 MG PO TABS
40.0000 mg | ORAL_TABLET | Freq: Every day | ORAL | Status: DC
Start: 2020-11-25 — End: 2020-11-23

## 2020-11-23 MED ORDER — IPRATROPIUM-ALBUTEROL 0.5-2.5 (3) MG/3ML IN SOLN
3.0000 mL | RESPIRATORY_TRACT | Status: DC | PRN
Start: 2020-11-23 — End: 2020-11-23

## 2020-11-23 MED ORDER — POLYETHYLENE GLYCOL 3350 17 G PO PACK
17.0000 g | PACK | Freq: Every day | ORAL | Status: DC | PRN
  Administered 2020-11-26: 17 g via ORAL
  Filled 2020-11-23: qty 1

## 2020-11-23 MED ORDER — ONDANSETRON HCL 4 MG PO TABS: 4.0000 mg | ORAL_TABLET | Freq: Four times a day (QID) | ORAL | Status: DC | PRN

## 2020-11-23 MED ORDER — MACITENTAN 10 MG PO TABS: 10.0000 mg | ORAL_TABLET | Freq: Every day | ORAL | Status: DC

## 2020-11-23 MED ORDER — LEVOTHYROXINE SODIUM 75 MCG PO TABS
75.0000 ug | ORAL_TABLET | Freq: Every day | ORAL | Status: DC
  Administered 2020-11-23 – 2020-11-29 (×7): 75 ug via ORAL
  Filled 2020-11-23 (×6): qty 1

## 2020-11-23 MED ORDER — ENOXAPARIN SODIUM 40 MG/0.4ML IJ SOSY
40.0000 mg | PREFILLED_SYRINGE | Freq: Every day | INTRAMUSCULAR | Status: DC
  Administered 2020-11-23 – 2020-11-28 (×6): 40 mg via SUBCUTANEOUS
  Filled 2020-11-23 (×7): qty 0.4

## 2020-11-23 MED ORDER — SODIUM CHLORIDE 0.9 % IV SOLN
1.0000 g | Freq: Once | INTRAVENOUS | Status: AC
  Administered 2020-11-23: 1 g via INTRAVENOUS
  Filled 2020-11-23: qty 10

## 2020-11-23 MED ORDER — ACETAMINOPHEN 325 MG PO TABS
650.0000 mg | ORAL_TABLET | Freq: Four times a day (QID) | ORAL | Status: DC | PRN
  Administered 2020-11-27: 650 mg via ORAL
  Filled 2020-11-23: qty 2

## 2020-11-23 MED ORDER — ARFORMOTEROL TARTRATE 15 MCG/2ML IN NEBU
15.0000 ug | INHALATION_SOLUTION | Freq: Two times a day (BID) | RESPIRATORY_TRACT | Status: DC
  Administered 2020-11-23 – 2020-11-29 (×13): 15 ug via RESPIRATORY_TRACT
  Filled 2020-11-23 (×13): qty 2

## 2020-11-23 MED ORDER — ASPIRIN 81 MG PO CHEW
81.0000 mg | CHEWABLE_TABLET | Freq: Every day | ORAL | Status: DC
  Administered 2020-11-23 – 2020-11-29 (×7): 81 mg via ORAL
  Filled 2020-11-23 (×7): qty 1

## 2020-11-23 MED ORDER — AMLODIPINE BESYLATE 5 MG PO TABS
5.0000 mg | ORAL_TABLET | Freq: Every day | ORAL | Status: DC
  Administered 2020-11-24 – 2020-11-25 (×2): 5 mg via ORAL
  Filled 2020-11-23 (×3): qty 1

## 2020-11-23 MED ORDER — SALINE SPRAY 0.65 % NA SOLN: 1.0000 | NASAL | Status: DC | PRN

## 2020-11-23 MED ORDER — SODIUM CHLORIDE 0.9 % IV SOLN
500.0000 mg | Freq: Every day | INTRAVENOUS | Status: AC
  Administered 2020-11-23 – 2020-11-27 (×5): 500 mg via INTRAVENOUS
  Filled 2020-11-23 (×5): qty 500

## 2020-11-23 MED ORDER — OXYCODONE HCL 5 MG PO TABS: 5.0000 mg | ORAL_TABLET | ORAL | Status: DC | PRN

## 2020-11-23 MED ORDER — IPRATROPIUM BROMIDE 0.06 % NA SOLN
2.0000 | Freq: Three times a day (TID) | NASAL | Status: DC
  Administered 2020-11-25 – 2020-11-28 (×8): 2 via NASAL
  Filled 2020-11-23 (×2): qty 15

## 2020-11-23 MED ORDER — SACCHAROMYCES BOULARDII 250 MG PO CAPS
250.0000 mg | ORAL_CAPSULE | Freq: Every day | ORAL | Status: DC
  Administered 2020-11-23 – 2020-11-29 (×7): 250 mg via ORAL
  Filled 2020-11-23 (×7): qty 1

## 2020-11-23 MED ORDER — BISACODYL 5 MG PO TBEC: 5.0000 mg | DELAYED_RELEASE_TABLET | Freq: Every day | ORAL | Status: DC | PRN

## 2020-11-23 MED ORDER — METHYLPREDNISOLONE SODIUM SUCC 125 MG IJ SOLR
125.0000 mg | Freq: Once | INTRAMUSCULAR | Status: AC
  Administered 2020-11-23: 125 mg via INTRAVENOUS
  Filled 2020-11-23: qty 2

## 2020-11-23 MED ORDER — SODIUM CHLORIDE 0.9 % IV SOLN
2.0000 g | INTRAVENOUS | Status: AC
  Administered 2020-11-23 – 2020-11-26 (×4): 2 g via INTRAVENOUS
  Filled 2020-11-23 (×4): qty 20

## 2020-11-23 MED ORDER — PANTOPRAZOLE SODIUM 40 MG PO TBEC
40.0000 mg | DELAYED_RELEASE_TABLET | Freq: Every day | ORAL | Status: DC
  Administered 2020-11-23 – 2020-11-29 (×7): 40 mg via ORAL
  Filled 2020-11-23 (×7): qty 1

## 2020-11-23 MED ORDER — CLOPIDOGREL BISULFATE 75 MG PO TABS
75.0000 mg | ORAL_TABLET | Freq: Every day | ORAL | Status: DC
  Administered 2020-11-23 – 2020-11-29 (×7): 75 mg via ORAL
  Filled 2020-11-23 (×7): qty 1

## 2020-11-23 MED ORDER — BUDESONIDE 0.5 MG/2ML IN SUSP
0.5000 mg | Freq: Two times a day (BID) | RESPIRATORY_TRACT | Status: DC
  Administered 2020-11-23 – 2020-11-29 (×13): 0.5 mg via RESPIRATORY_TRACT
  Filled 2020-11-23 (×13): qty 2

## 2020-11-23 MED ORDER — SODIUM CHLORIDE 0.9% FLUSH
3.0000 mL | Freq: Two times a day (BID) | INTRAVENOUS | Status: DC
  Administered 2020-11-23 – 2020-11-28 (×11): 3 mL via INTRAVENOUS

## 2020-11-23 MED ORDER — IOHEXOL 350 MG/ML SOLN
75.0000 mL | Freq: Once | INTRAVENOUS | Status: AC | PRN
  Administered 2020-11-23: 75 mL via INTRAVENOUS

## 2020-11-23 MED ORDER — METHYLPREDNISOLONE SODIUM SUCC 125 MG IJ SOLR: 60.0000 mg | Freq: Two times a day (BID) | INTRAMUSCULAR | Status: DC

## 2020-11-23 MED ORDER — MOMETASONE FURO-FORMOTEROL FUM 200-5 MCG/ACT IN AERO: 2.0000 | INHALATION_SPRAY | Freq: Two times a day (BID) | RESPIRATORY_TRACT | Status: DC

## 2020-11-23 MED ORDER — ACETAMINOPHEN 650 MG RE SUPP: 650.0000 mg | Freq: Four times a day (QID) | RECTAL | Status: DC | PRN

## 2020-11-23 NOTE — ED Notes (Signed)
Pt eating breakfast

## 2020-11-23 NOTE — TOC Initial Note (Signed)
Transition of Care [facility]) - Initial/Assessment Note    Patient Details  Name: [name] MRN: [id number] Date of Birth: 07-22-1940  Transition of Care [facility]) CM/SW Contact:    Verdell Carmine, RN Phone Number: 11/23/2020, 11:42 AM  Clinical Narrative:                 80 year old, admitted with pneumonia,  On 8-10 liters of oxygen at home for chronic respiratory hypozia, her saturations decreased to in the 70's at home. She has oxygen and wheelchair at home. She had COVID in January but fortunately was asymptomatic.  She is wheelchair bound, lives with daughter. Recommend palliative consult for goals of care. Consider SNF for reconditioning if recommended.CM and CSW will follow for needs and transistions   Expected Discharge Plan: [place] Barriers to Discharge: Continued Medical Work up   Patient Goals and CMS Choice        Expected Discharge Plan and Services Expected Discharge Plan: [place] arrangements for the past 2 months: [place]                                      Prior Living Arrangements/Services Living arrangements for the past 2 months: Single Family Home Lives with:: Adult Children Patient language and need for interpreter reviewed:: Yes        Need for Family Participation in Patient Care: Yes (Comment) Care giver support system in place?: Yes (comment) Current home services: DME (oxygen, wheelchair) Criminal Activity/Legal Involvement Pertinent to Current Situation/Hospitalization: No - Comment as needed  Activities of Daily Living      Permission Sought/Granted                  Emotional Assessment       Orientation: : Oriented to Self, Oriented to Place, Oriented to Situation Alcohol / Substance Use: Not Applicable Psych Involvement: No (comment)  Admission diagnosis:  Acute on chronic respiratory failure with hypoxia ([place]) [J96.21] Patient Active Problem List    Diagnosis Date Noted   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   COPD with acute exacerbation ([place]) 08/27/2020   COVID-19 05/01/2020   Pulmonary hypertension ([place]) 01/15/2020   Hypoxemic respiratory failure, chronic ([place]) 01/15/2020   Lung nodule, multiple 01/15/2020   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   SOB (shortness of breath) 12/26/2019   Acute on chronic respiratory failure with hypoxia ([place]) 12/25/2019   Acute respiratory failure with hypoxemia ([place]) 12/25/2019   Essential hypertension 06/06/2017   Hyperlipidemia 06/06/2017   Leg cramps 06/06/2017   Vertigo 06/06/2017   Macular degeneration 06/06/2017   Hypothyroidism 06/06/2017   Carotid stenosis 01/22/2014   Aftercare following surgery of the circulatory system, NEC 01/02/2013   Occlusion and stenosis of carotid artery without mention of cerebral infarction 04/27/2011   PCP:  [name], MD Pharmacy:   [id number] [place]Phone: [phone]: [phone] [place] [address] [address] [id number]: [phone]: [phone]    Social Determinants of Health (SDOH) Interventions    Readmission Risk Interventions No flowsheet data found.

## 2020-11-23 NOTE — ED Notes (Signed)
Lunch tray ordered 

## 2020-11-23 NOTE — Consult Note (Signed)
NAME:  [name], MRN:  [id number], DOB:  09/12/1940, LOS: 0 ADMISSION DATE:  11/22/2020, CONSULTATION DATE: 11/23/2020 REFERRING MD: Hospitalist, CHIEF COMPLAINT: Hypoxia  History of Present Illness:  80 year old female with known chronic hypoxia pulmonary hypertension requiring 8 to 10 L nasal cannula daily basis.  Of note she had COVID January 2022 was asymptomatic.  Is been seen by Dr. Beckie Salts 11/14/2020 with a similar episode of shortness of breath.  She has been ruled out for sleep apnea.  He is unable to do pulmonary function test.  She is noted to be declining in health and becoming wheelchair-bound within the last month. She reports increasing shortness of breath x2 days positive for fever and chills without sputum production or cough.  She notes her O2 sats have decreased into the 70s and she presented to the hospital for further evaluation and treatment.  Seen by the hospitalist service started on antimicrobial therapy increasing oxygen to meet sats requirements of 90%.  She is awake alert no acute distress able to [facility] and follow commands.  We will agree with steroids antibiotics increasing oxygen and diuresis.  She has been pancultured and she will be admitted to the stepdown unit to be monitored appropriately.  Pertinent  Medical History   Past Medical History:  Diagnosis Date   Arthritis    Carotid artery occlusion    Chronic respiratory failure with hypoxia, on home O2 therapy (HCC)    COPD (chronic obstructive pulmonary disease) (HCC)    GERD (gastroesophageal reflux disease)    Glaucoma    Hypertension    Hypothyroidism (acquired)    Pulmonary hypertension (HCC)    Vasomotor rhinitis      Significant Hospital Events: Including procedures, antibiotic start and stop dates in addition to other pertinent events     Interim History / Subjective:  Noted with 2 days of increasing shortness of breath noted to have chills and sweats without sputum production  Objective    Blood pressure 101/63, pulse 77, temperature 98 F (36.7 C), temperature source Oral, resp. rate 17, height _0  (1.6 m), weight 56.2 kg, SpO2 96 %.        Intake/Output Summary (Last 24 hours) at 11/23/2020 0910 Last data filed at 11/23/2020 0730 Gross per 24 hour  Intake 100 ml  Output --  Net 100 ml   Filed Weights   11/23/20 3614  Weight: 56.2 kg    Examination: General: Elderly female no acute distress HENT: Mild JVD is appreciated Lungs: Decreased air movement throughout Cardiovascular: Heart sounds are distant Abdomen: Soft nontender positive bowel sound Extremities: No edema Neuro: Grossly intact with poor memory GU: Voids  Resolved Hospital Problem list     Assessment & Plan:  Acute on chronic respiratory failure, hypoxia, copd,pulmonary hypertension requiring 8 to 10 L nasal cannula daily.  She did have COVID 04/2020 but was asymptomatic.  She is followed by Dr. Beckie Salts at [facility] pulmonary critical care.  She is unable to perform pulmonary function test.  She has been ruled out for obstructive sleep apnea.  She has had episodes where she presents to the hospital with desaturation responsive treatment and is discharged.  She is steroid-dependent and she is on every other day of diuresis with Lasix.  Last 2D echo on 08/29/2020 demonstrated left ventricular ejection fraction of 60 to 65%.  Right systolic function is mildly reduced.  Right ventricular systolic pressure 43.1. She presents to [facility] 11/22/2020 with chief complaints of increasing shortness  of breath, desaturations despite being on 8 to 10 L nasal cannula of oxygen.  Pulmonary critical care called to bedside to evaluate in the emergency department. Admit to stepdown unit Administer oxygen to keep sats 90% or greater Diuresis IV furosemide Bronchodilators Empirical treatment for presumed right upper lobe pneumonia Solu-Medrol 60 mg IV every 8 hour  Pulmonary critical care will continue to follow  with you   Hypertension Continue antihypertensive  Hypothyroidism Continue Synthroid   Best Practice (right click and "Reselect all SmartList Selections" daily)   Diet/type: Regular consistency (see orders) DVT prophylaxis: other GI prophylaxis: PPI Lines: N/A Foley:  N/A Code Status:  full code Last date of multidisciplinary goals of care discussion [per primary]  Labs   CBC: Recent Labs  Lab 11/22/20 2300  WBC 15.5*  NEUTROABS 12.6*  HGB 11.5*  HCT 37.7  MCV 97.4  PLT 254    Basic Metabolic Panel: Recent Labs  Lab 11/22/20 2300  NA 141  K 4.1  CL 109  CO2 22  GLUCOSE 139*  BUN 11  CREATININE 0.75  CALCIUM 9.3   GFR: Estimated Creatinine Clearance: 44.8 mL/min (by C-G formula based on SCr of 0.75 mg/dL). Recent Labs  Lab 11/22/20 2300  WBC 15.5*    Liver Function Tests: No results for input(s): AST, ALT, ALKPHOS, BILITOT, PROT, ALBUMIN in the last 168 hours. No results for input(s): LIPASE, AMYLASE in the last 168 hours. No results for input(s): AMMONIA in the last 168 hours.  ABG    Component Value Date/Time   PHART 7.409 12/25/2019 2218   PCO2ART 30.1 (L) 12/25/2019 2218   PO2ART 73 (L) 12/25/2019 2218   HCO3 22.7 09/01/2020 0831   HCO3 22.9 09/01/2020 0831   TCO2 24 09/01/2020 0831   TCO2 24 09/01/2020 0831   ACIDBASEDEF 2.0 09/01/2020 0831   ACIDBASEDEF 2.0 09/01/2020 0831   O2SAT 73.0 09/01/2020 0831   O2SAT 75.0 09/01/2020 0831     Coagulation Profile: No results for input(s): INR, PROTIME in the last 168 hours.  Cardiac Enzymes: No results for input(s): CKTOTAL, CKMB, CKMBINDEX, TROPONINI in the last 168 hours.  HbA1C: Hgb A1c MFr Bld  Date/Time Value Ref Range Status  12/26/2019 07:09 AM 5.7 (H) 4.8 - 5.6 % Final    Comment:    (NOTE) Pre diabetes:          5.7%-6.4%  Diabetes:              >6.4%  Glycemic control for   <7.0% adults with diabetes   04/17/2008 10:40 PM (H) 4.6 - 6.1 % Final   6.2 (NOTE)   The ADA  recommends the following therapeutic goal for glycemic   control related to Hgb A1C measurement:   Goal of Therapy:   < 7.0% Hgb A1C   Reference: American Diabetes Association: Clinical Practice   Recommendations 2008, Diabetes Care,  2008, 31:(Suppl 1).    CBG: No results for input(s): GLUCAP in the last 168 hours.  Review of Systems:   10 point review of system taken, please see HPI for positives and negatives.   Past Medical History:  She,  has a past medical history of Arthritis, Carotid artery occlusion, Chronic respiratory failure with hypoxia, on home O2 therapy ([place]), COPD (chronic obstructive pulmonary disease) ([place]), GERD (gastroesophageal reflux disease), Glaucoma, Hypertension, Hypothyroidism (acquired), Pulmonary hypertension ([place]), and Vasomotor rhinitis.   Surgical History:   Past Surgical History:  Procedure Laterality Date   ABDOMINAL HYSTERECTOMY  AORTIC ARCH ANGIOGRAPHY N/A 03/01/2017   Procedure: AORTIC ARCH ANGIOGRAPHY;  Surgeon: Serafina [name], MD;  Location: [place] CV LAB;  Service: Cardiovascular;  Laterality: N/A;   ARCH AORTOGRAM N/A 05/04/2011   Procedure: ARCH AORTOGRAM;  Surgeon: Serafina [name], MD;  Location: [facility] CATH LAB;  Service: Cardiovascular;  Laterality: N/A;   ARCH AORTOGRAM N/A 07/04/2012   Procedure: ARCH AORTOGRAM;  Surgeon: Serafina [name], MD;  Location: [facility] CATH LAB;  Service: Cardiovascular;  Laterality: N/A;   CAROTID ANGIOGRAM  July 04, 2012   CAROTID ANGIOGRAM N/A 05/04/2011   Procedure: CAROTID Cyril Loosen;  Surgeon: Serafina [name], MD;  Location: [facility] CATH LAB;  Service: Cardiovascular;  Laterality: N/A;   CAROTID ANGIOGRAM N/A 07/04/2012   Procedure: CAROTID ANGIOGRAM;  Surgeon: Serafina [name], MD;  Location: [facility] CATH LAB;  Service: Cardiovascular;  Laterality: N/A;   CAROTID STENT INSERTION Right 05/11/2011   Procedure: CAROTID STENT INSERTION;  Surgeon: Serafina [name], MD;  Location: [facility] CATH LAB;  Service: Cardiovascular;   Laterality: Right;   carotid subclavian anastomosis  05/11/11   Right subclav. artery transected 12/17/09   CATARACT EXTRACTION     bilateral   EYE SURGERY     RIGHT HEART CATH N/A 09/01/2020   Procedure: RIGHT HEART CATH;  Surgeon: Larey Dresser, MD;  Location: [place] CV LAB;  Service: Cardiovascular;  Laterality: N/A;     Social History:   reports that she quit smoking about 40 years ago. Her smoking use included cigarettes. She has a 1.50 pack-year smoking history. She has never used smokeless tobacco. She reports that she does not drink alcohol and does not use drugs.   Family History:  Her family history includes Cancer in her father; Heart failure in her mother. There is no history of Breast cancer.   Allergies Allergies  Allergen Reactions   Meclizine Nausea And Vomiting     Home Medications  Prior to Admission medications   Medication Sig Start Date End Date Taking? Authorizing Provider  acetaminophen (TYLENOL) 325 MG tablet Take 650 mg every 6 (six) hours as needed by mouth for mild pain or headache.    Yes [provider]  albuterol (PROVENTIL) (2.5 MG/3ML) 0.083% nebulizer solution Take 3 mLs (2.5 mg total) by nebulization every 6 (six) hours as needed for wheezing or shortness of breath. 08/15/20  Yes Olalere, Adewale A, MD  albuterol (VENTOLIN HFA) 108 (90 Base) MCG/ACT inhaler Inhale 2 puffs into the lungs every 6 (six) hours as needed for wheezing or shortness of breath. 09/18/20  Yes [name], MD  amLODipine (NORVASC) 5 MG tablet TAKE 1 TABLET BY MOUTH EVERY DAY 10/23/20  Yes [name], MD  arformoterol (BROVANA) 15 MCG/2ML NEBU Take 2 mLs (15 mcg total) by nebulization 2 (two) times daily. 08/15/20  Yes Olalere, Adewale A, MD  aspirin 81 MG chewable tablet Chew 81 mg by mouth daily.   Yes [provider]  budesonide (PULMICORT) 0.5 MG/2ML nebulizer solution Take 2 mLs (0.5 mg total) by nebulization in the morning and at bedtime. 08/15/20   Yes Olalere, Adewale A, MD  calcium-vitamin D (OSCAL WITH D) 500-200 MG-UNIT per tablet Take 1 tablet by mouth daily with breakfast.   Yes [provider]  clopidogrel (PLAVIX) 75 MG tablet TAKE 1 TABLET BY MOUTH EVERY DAY 09/30/20  Yes [name], MD  docusate sodium (COLACE) 100 MG capsule TAKE 1 CAPSULE BY MOUTH EVERY DAY AS NEEDED FOR MILD  CONSTIPATION 10/10/20  Yes [name], MD  fluticasone [facility]) 50 MCG/ACT nasal spray Place 1 spray into both nostrils daily as needed for allergies or rhinitis (for sinus congestion). 01/15/20  Yes [name], MD  fluticasone-salmeterol (ADVAIR HFA) [phone] MCG/ACT inhaler INHALE 2 PUFFS INTO THE LUNGS TWICE A DAY Patient taking differently: Inhale 2 puffs into the lungs 2 (two) times daily. 11/18/20  Yes [name], MD  furosemide (LASIX) 20 MG tablet 1 tab PO every other day Patient taking differently: Take 20 mg by mouth See admin instructions. 1 tablet by mouth every other day 09/18/20  Yes [name], MD  gabapentin (NEURONTIN) 100 MG capsule TAKE 1 CAPSULE (100 MG TOTAL) BY MOUTH AT BEDTIME. 10/11/20  Yes [name], MD  guaiFENesin-dextromethorphan (ROBITUSSIN DM) 100-10 MG/5ML syrup Take 10 mLs by mouth every 4 (four) hours as needed for cough. 05/04/20  Yes Caren Griffins, MD  lansoprazole (PREVACID) 15 MG capsule Take 1 capsule (15 mg total) by mouth daily. 11/18/20  Yes [name], MD  latanoprost (XALATAN) 0.005 % ophthalmic solution Place 1 drop at bedtime into both eyes. 01/15/17  Yes [provider]  levothyroxine (SYNTHROID) 50 MCG tablet Take 1.5 tablets (75 mcg total) by mouth daily. 07/28/20  Yes [name], MD  loratadine (CLARITIN) 10 MG tablet Take 1 tablet (10 mg total) by mouth daily as needed for allergies. 11/20/20  Yes [name], MD  macitentan (OPSUMIT) 10 MG tablet Take 1 tablet (10 mg total) by mouth daily. This medicine will be mailed to your house.  09/07/20  Yes Enzo Bi, MD  metoprolol succinate (TOPROL-XL) 25 MG 24 hr tablet TAKE 1/2 TABLET BY MOUTH EVERY DAY Patient taking differently: Take 12.5 mg by mouth daily. 09/22/20  Yes [name], MD  Multiple Vitamins-Minerals (ALIVE WOMENS ENERGY) TABS Take 1 tablet by mouth daily as needed (supplementation).   Yes [provider]  potassium chloride SA (KLOR-CON) 20 MEQ tablet Take 2 tablets (40 mEq total) by mouth daily. 09/26/20  Yes Clegg, Amy D, NP  predniSONE (DELTASONE) 5 MG tablet Take 1 tablet (5 mg total) by mouth daily with breakfast. 11/14/20  Yes Olalere, Adewale A, MD  Probiotic Product (PROBIOTIC PO) Take 1 capsule by mouth daily.   Yes [provider]  simvastatin (ZOCOR) 20 MG tablet TAKE 1 TABLET BY MOUTH EVERYDAY AT BEDTIME Patient taking differently: Take 20 mg by mouth at bedtime. TAKE 1 TABLET BY MOUTH EVERYDAY AT BEDTIME 08/20/20  Yes [name], MD  lisinopril (ZESTRIL) 20 MG tablet TAKE 1 TABLET BY MOUTH EVERY DAY Patient not taking: Reported on 11/23/2020 10/10/20   [name], MD     Critical care time:     Richardson Landry Aubrey Blackard ACNP Acute Care Nurse Practitioner Maryanna Shape Pulmonary/Critical Care Please consult [place] 11/23/2020, 9:10 AM

## 2020-11-23 NOTE — ED Provider Notes (Signed)
[facility] EMERGENCY DEPARTMENT Provider Note   CSN: [id number] Arrival date & time: 11/22/20  2228     History No chief complaint on file.   Marites DESTIN KITTLER is a 80 y.o. female.  The history is provided by the patient, medical records and a relative.  Xareni FRANCE LUSTY is a 80 y.o. female who presents to the Emergency Department complaining of sob.  She is accompanied by her daughter.  She has chronic sob and uses 8LNC at baseline, 10 on ambulation.  For the last few days she has been experiencing worsening SOB/DOE and has been satting around 85% on her home 8L.  She now gets significantly winded on ambulation to the bathroom.   No fever, chest pain.  No AP, N/V. Has BLE edema since march.   Occasional dry cough.     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   SOB (shortness of breath) 12/26/2019   Acute on chronic respiratory failure with hypoxia ([place]) 12/25/2019   Acute respiratory failure with hypoxemia ([place]) 12/25/2019   Essential hypertension 06/06/2017   Hyperlipidemia 06/06/2017   Leg cramps 06/06/2017   Vertigo 06/06/2017   Macular degeneration 06/06/2017   Hypothyroidism 06/06/2017   Carotid stenosis 01/22/2014   Aftercare following surgery of the circulatory system, NEC 01/02/2013   Occlusion and stenosis of carotid artery without mention of cerebral infarction 04/27/2011    Past Surgical History:  Procedure Laterality Date   ABDOMINAL  HYSTERECTOMY     AORTIC ARCH ANGIOGRAPHY N/A 03/01/2017   Procedure: AORTIC ARCH ANGIOGRAPHY;  Surgeon: Serafina [name], MD;  Location: [facility];  Service: Cardiovascular;  Laterality: N/A;   ARCH AORTOGRAM N/A 05/04/2011   Procedure: ARCH AORTOGRAM;  Surgeon: Serafina [name], MD;  Location: [facility] CATH LAB;  Service: Cardiovascular;  Laterality: N/A;   ARCH AORTOGRAM N/A 07/04/2012   Procedure: ARCH AORTOGRAM;  Surgeon: Serafina [name], MD;  Location: [facility] CATH LAB;  Service: Cardiovascular;  Laterality: N/A;   CAROTID ANGIOGRAM  July 04, 2012   CAROTID ANGIOGRAM N/A 05/04/2011   Procedure: CAROTID Cyril Loosen;  Surgeon: Serafina [name], MD;  Location: [facility] CATH LAB;  Service: Cardiovascular;  Laterality: N/A;   CAROTID ANGIOGRAM N/A 07/04/2012   Procedure: CAROTID ANGIOGRAM;  Surgeon: Serafina [name], MD;  Location: [facility] CATH LAB;  Service: Cardiovascular;  Laterality: N/A;   CAROTID STENT INSERTION Right 05/11/2011   Procedure: CAROTID STENT INSERTION;  Surgeon: Serafina [name], MD;  Location: [facility] CATH LAB;  Service: Cardiovascular;  Laterality: Right;   carotid subclavian anastomosis  05/11/11   Right subclav. artery transected 12/17/09   CATARACT EXTRACTION     bilateral   EYE SURGERY     RIGHT HEART CATH N/A 09/01/2020   Procedure: RIGHT HEART CATH;  Surgeon: Larey Dresser, MD;  Location: [place] [facility];  Service: Cardiovascular;  Laterality: N/A;     OB History   No  obstetric history on file.     Family History  Problem Relation Age of Onset   Heart failure Mother    Cancer Father    Breast cancer Neg Hx     Social History   Tobacco Use   Smoking status: Former    Packs/day: 0.30    Years: 5.00    Pack years: 1.50    Types: Cigarettes    Quit date: 04/12/1980    Years since quitting: 40.6   Smokeless tobacco: Never  Vaping Use   Vaping Use: Never used  Substance Use Topics   Alcohol use: No   Drug use: No    Home Medications Prior to Admission medications    Medication Sig Start Date End Date Taking? Authorizing Provider  acetaminophen (TYLENOL) 325 MG tablet Take 650 mg every 6 (six) hours as needed by mouth for mild pain or headache.     [provider]  albuterol (PROVENTIL) (2.5 MG/3ML) 0.083% nebulizer solution Take 3 mLs (2.5 mg total) by nebulization every 6 (six) hours as needed for wheezing or shortness of breath. 08/15/20   Laurin Coder, MD  albuterol (VENTOLIN HFA) 108 (90 Base) MCG/ACT inhaler Inhale 2 puffs into the lungs every 6 (six) hours as needed for wheezing or shortness of breath. 09/18/20   [name], MD  amLODipine (NORVASC) 5 MG tablet TAKE 1 TABLET BY MOUTH EVERY DAY 10/23/20   [name], MD  arformoterol (BROVANA) 15 MCG/2ML NEBU Take 2 mLs (15 mcg total) by nebulization 2 (two) times daily. 08/15/20   Laurin Coder, MD  aspirin 81 MG chewable tablet Chew 81 mg by mouth daily.    [provider]  budesonide (PULMICORT) 0.5 MG/2ML nebulizer solution Take 2 mLs (0.5 mg total) by nebulization in the morning and at bedtime. 08/15/20   Laurin Coder, MD  calcium-vitamin D (OSCAL WITH D) 500-200 MG-UNIT per tablet Take 1 tablet by mouth daily with breakfast.    [provider]  clopidogrel (PLAVIX) 75 MG tablet TAKE 1 TABLET BY MOUTH EVERY DAY 09/30/20   [name], MD  docusate sodium (COLACE) 100 MG capsule TAKE 1 CAPSULE BY MOUTH EVERY DAY AS NEEDED FOR MILD CONSTIPATION 10/10/20   [name], MD  fluticasone (FLONASE) 50 MCG/ACT nasal spray Place 1 spray into both nostrils daily as needed for allergies or rhinitis (for sinus congestion). 01/15/20   [name], MD  fluticasone-salmeterol (ADVAIR HFA) [phone] MCG/ACT inhaler INHALE 2 PUFFS INTO THE LUNGS TWICE A DAY 11/18/20   [name], MD  furosemide (LASIX) 20 MG tablet 1 tab PO every other day 09/18/20   [name], MD  gabapentin (NEURONTIN) 100 MG capsule TAKE 1 CAPSULE (100 MG TOTAL) BY MOUTH  AT BEDTIME. 10/11/20   [name], MD  guaiFENesin-dextromethorphan (ROBITUSSIN DM) 100-10 MG/5ML syrup Take 10 mLs by mouth every 4 (four) hours as needed for cough. 05/04/20   Caren Griffins, MD  lansoprazole (PREVACID) 15 MG capsule Take 1 capsule (15 mg total) by mouth daily. 11/18/20   [name], MD  latanoprost (XALATAN) 0.005 % ophthalmic solution Place 1 drop at bedtime into both eyes. 01/15/17   [provider]  levothyroxine (SYNTHROID) 50 MCG tablet Take 1.5 tablets (75 mcg total) by mouth daily. 07/28/20   [name], MD  lisinopril (ZESTRIL) 20 MG tablet TAKE 1 TABLET BY MOUTH EVERY DAY 10/10/20   [name], MD  loratadine (  CLARITIN) 10 MG tablet Take 1 tablet (10 mg total) by mouth daily as needed for allergies. 11/20/20   [name], MD  macitentan (OPSUMIT) 10 MG tablet Take 1 tablet (10 mg total) by mouth daily. This medicine will be mailed to your house. 09/07/20   Enzo Bi, MD  metoprolol succinate (TOPROL-XL) 25 MG 24 hr tablet TAKE 1/2 TABLET BY MOUTH EVERY DAY 09/22/20   [name], MD  Multiple Vitamins-Minerals (ALIVE WOMENS ENERGY) TABS Take 1 tablet by mouth daily.    [provider]  potassium chloride SA (KLOR-CON) 20 MEQ tablet Take 2 tablets (40 mEq total) by mouth daily. 09/26/20   Clegg, Amy D, NP  predniSONE (DELTASONE) 5 MG tablet Take 1 tablet (5 mg total) by mouth daily with breakfast. 11/14/20   Sherrilyn Rist A, MD  Probiotic Product (PROBIOTIC PO) Take 1 capsule by mouth daily.    [provider]  simvastatin (ZOCOR) 20 MG tablet TAKE 1 TABLET BY MOUTH EVERYDAY AT BEDTIME 08/20/20   [name], MD    Allergies    Meclizine  Review of Systems   Review of Systems  All other systems reviewed and are negative.  Physical Exam Updated Vital Signs BP (!) 116/42   Pulse 88   Temp 98.2 F (36.8 C) (Oral)   Resp (!) 26   SpO2 (!) 89%   Physical Exam Vitals and nursing note  reviewed.  Constitutional:      Appearance: She is well-developed.  HENT:     Head: Normocephalic and atraumatic.  Cardiovascular:     Rate and Rhythm: Regular rhythm. Tachycardia present.     Heart sounds: No murmur heard. Pulmonary:     Effort: Pulmonary effort is normal. No respiratory distress.     Comments: Tachypnea, fine crackles in the right lung base Abdominal:     Palpations: Abdomen is soft.     Tenderness: There is no abdominal tenderness. There is no guarding or rebound.  Musculoskeletal:        General: No swelling or tenderness.     Comments: Faint DP pulses bilaterally  Skin:    General: Skin is warm and dry.  Neurological:     Mental Status: She is alert and oriented to person, place, and time.  Psychiatric:        Behavior: Behavior normal.    ED Results / Procedures / Treatments   Labs (all labs ordered are listed, but only abnormal results are displayed) Labs Reviewed  BASIC METABOLIC PANEL - Abnormal; Notable for the following components:      Result Value   Glucose, Bld 139 (*)    All other components within normal limits  CBC WITH DIFFERENTIAL/PLATELET - Abnormal; Notable for the following components:   WBC 15.5 (*)    Hemoglobin 11.5 (*)    Neutro Abs 12.6 (*)    Abs Immature Granulocytes 0.09 (*)    All other components within normal limits  BRAIN NATRIURETIC PEPTIDE - Abnormal; Notable for the following components:   B Natriuretic Peptide 105.7 (*)    All other components within normal limits  D-DIMER, QUANTITATIVE - Abnormal; Notable for the following components:   D-Dimer, Quant 1.38 (*)    All other components within normal limits  RESP PANEL BY RT-PCR (FLU A&B, COVID) [id number]    EKG EKG Interpretation  Date/Time:  Sunday November 23 2020 00:54:27 EDT Ventricular Rate:  96 PR Interval:  171 QRS Duration: 77 QT Interval:  337  QTC Calculation: 426 R Axis:   33 Text Interpretation: Sinus rhythm Borderline repolarization abnormality  Confirmed by Ripley Fraise [phone]) on 11/23/2020 1:05:52 AM  Radiology DG Chest 2 View  Result Date: 11/22/2020 CLINICAL DATA:  Shortness of breath EXAM: CHEST - 2 VIEW COMPARISON:  08/27/2020 FINDINGS: Cardiac shadow is within normal limits. Aortic calcifications are noted. No focal infiltrate or sizable effusion is seen. Chronic interstitial changes are noted in the bases. No bony abnormality is seen. IMPRESSION: No acute abnormality noted. Electronically Signed   By: Inez Catalina M.D.   On: 11/22/2020 23:19   CT Angio Chest PE W/Cm &/Or Wo Cm  Result Date: 11/23/2020 CLINICAL DATA:  80 year old female with shortness of breath. EXAM: CT ANGIOGRAPHY CHEST WITH CONTRAST TECHNIQUE: Multidetector CT imaging of the chest was performed using the standard protocol during bolus administration of intravenous contrast. Multiplanar CT image reconstructions and MIPs were obtained to evaluate the vascular anatomy. CONTRAST:  37m OMNIPAQUE IOHEXOL 350 MG/ML SOLN COMPARISON:  CT chest 08/30/2020, CTA chest 08/27/2020. Chest radiographs 11/22/2020. FINDINGS: Cardiovascular: Excellent contrast bolus timing in the pulmonary arterial tree. Mild respiratory motion. No focal filling defect identified in the pulmonary arteries to suggest acute pulmonary embolism. Extensive Calcified aortic atherosclerosis. Little aortic contrast today. No cardiomegaly or pericardial effusion. Calcified coronary artery atherosclerosis is extensive. Mediastinum/Nodes: Upper limits of normal to mildly enlarged mediastinal and hilar lymph nodes are stable since May. No mediastinal mass or progressive lymphadenopathy. Lungs/Pleura: Confluent and enhancing dependent atelectasis in both lungs. Major airways remain patent. Lower lung volumes compared to May with increased asymmetric peripheral and distal peribronchial interstitial opacity in the right upper lobe (such as series 6, image 39). Underlying emphysema. No convincing consolidation. No  pleural effusion. Upper Abdomen: Negative visible liver, gallbladder, spleen, pancreas, adrenal glands, kidneys and bowel in the upper abdomen. Musculoskeletal: Osteopenia. Mild chronic T10 and T11 compression fractures are stable since May. A mild to moderate L2 superior endplate compression fracture is stable since 2017. No acute osseous abnormality identified. Review of the MIP images confirms the above findings. IMPRESSION: 1. Negative for acute pulmonary embolus. 2. Emphysema (ICD10-J43.9) with lower lung volumes and dependent atelectasis. But new asymmetric right upper lobe interstitial opacity is suspicious for developing pneumonia. No pleural effusion. 3. Stable borderline to mildly enlarged mediastinal and bilateral hilar lymph nodes since May. 4. Calcified coronary artery and Aortic Atherosclerosis (ICD10-I70.0). Electronically Signed   By: HGenevie AnnM.D.   On: 11/23/2020 05:33    Procedures Procedures   Medications Ordered in ED Medications  cefTRIAXone (ROCEPHIN) 1 g in sodium chloride 0.9 % 100 mL IVPB (has no administration in time range)  doxycycline (VIBRA-TABS) tablet 100 mg (has no administration in time range)  methylPREDNISolone sodium succinate (SOLU-MEDROL) 125 mg/2 mL injection 125 mg (125 mg Intravenous Given 11/23/20 0421)  iohexol (OMNIPAQUE) 350 MG/ML injection 75 mL (75 mLs Intravenous Contrast Given 11/23/20 0502)    ED Course  I have reviewed the triage vital signs and the nursing notes.  Pertinent labs & imaging results that were available during my care of the patient were reviewed by me and considered in my medical decision making (see chart for details).    MDM Rules/Calculators/A&P                          patient with COPD and pulmonary hypertension on baseline a liters nasal cannula here for evaluation of shortness of breath and dyspnea on exertion. She  is chronically ill appearing on evaluation without respiratory distress. She does have a significantly  increased oxygen requirement compared to her baseline. She now requires 15 L high flow nasal cannula in order to maintain sats in the low 90s. Chest x-ray is without acute abnormality. D dimer was obtained, which was elevated. CTA was obtained, which is negative for PE, but demonstrates new developing infiltrate concerning for pneumonia. She was treated with steroids given patient is on chronic steroids and steroid dependent at this time. She is also treated with antibiotics for developing pneumonia. Medicine consulted for admission for ongoing treatment.. Final Clinical Impression(s) / ED Diagnoses Final diagnoses:  Community acquired pneumonia of right upper lobe of lung  Hypoxia    Rx / DC Orders ED Discharge Orders     None        Quintella Reichert, MD 11/23/20 [id number]

## 2020-11-23 NOTE — H&P (Signed)
History and Physical    [name]:[id number] DOB: 05/04/1940 DOA: 11/22/2020  PCP: [name], MD Consultants:  Ander Slade - pulmonology; Aundra Dubin - cardiology Patient coming from:  Home - lives with daughter; [facility]: Daughter, [phone]  Chief Complaint: SOB  HPI: [name] is a 80 y.o. 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   She reports that her oxygen dropped.  She noticed hypoxia about 11pm last night.  She got up to use the bathroom.  She had been in bed and O2 was dropping at rest bu tit was worse when she got up to the restroom and they couldn't get it stabilized.  It usually runs 95%+ with lying down, when sitting up it has been dropping into the 80s for maybe a week.  When she gets up and moved her O2 drops.  Last night after she got up they couldn't get it stabilized - as high as 80s and as low as 54.  Some congestion, had chills the other night.  No sick contacts.  Some cough that is different from usual, productive.  She was started on prednisone on 8/10 bu [name], 54m with productive sputum since starting it.     ED Course: Carryover, per Dr. SCyd Silence  80year old female with past medical history of severe pulmonary hypertension (on 8lpm via [place] at rest at home), hypothyroidism, COPD, hypertension presenting with progressively worsening shortness of breath at home beyond her baseline.  In the emergency department patient has required 15 L of oxygen via a nonrebreather mask to address her degree of shortness of breath and hypoxia.  CT angiogram of the chest was performed revealing no evidence of pulmonary embolism but is revealing an early right upper lobe developing infiltrate concerning for right upper lobe pneumonia.  Patient initiated on intravenous antibiotics.  Patient  to a  progressive bed considering increased oxygen requirement. Patient follows with Dr. OAnder Slade last appointment was 8/5.   Review of Systems: As per HPI; otherwise review of systems reviewed and negative.   Ambulatory Status:  Ambulates without assistance  COVID Vaccine Status:  None  Past Medical History:  Diagnosis Date   Arthritis    Carotid artery occlusion    Chronic respiratory failure with hypoxia, on home O2 therapy (HCC)    COPD (chronic obstructive pulmonary disease) (HCC)    GERD (gastroesophageal reflux disease)    Glaucoma    Hypertension    Hypothyroidism (acquired)    Pulmonary hypertension (HCC)    Vasomotor rhinitis     Past Surgical History:  Procedure Laterality Date   ABDOMINAL HYSTERECTOMY     AORTIC ARCH ANGIOGRAPHY N/A 03/01/2017   Procedure: AORTIC ARCH ANGIOGRAPHY;  Surgeon: BSerafina [name] MD;  [facility];  Service: Cardiovascular;  Laterality: N/A;   ARCH AORTOGRAM N/A 05/04/2011   Procedure: ARCH AORTOGRAM;  Surgeon: VSerafina [name] MD;  [facility];  Service: Cardiovascular;  Laterality: N/A;   ARCH AORTOGRAM N/A 07/04/2012   Procedure: ARCH AORTOGRAM;  Surgeon: VSerafina [name] MD;  [facility];  Service: Cardiovascular;  Laterality: N/A;   CAROTID ANGIOGRAM  July 04, 2012   CAROTID ANGIOGRAM N/A 05/04/2011   Procedure: CAROTID ACyril Loosen  Surgeon: VSerafina [name] MD;  [facility];  Service: Cardiovascular;  Laterality: N/A;   CAROTID  ANGIOGRAM N/A 07/04/2012   Procedure: CAROTID ANGIOGRAM;  Surgeon: Serafina [name], MD;  Location: [facility] CATH LAB;  Service: Cardiovascular;  Laterality: N/A;   CAROTID STENT INSERTION Right 05/11/2011   Procedure: CAROTID STENT INSERTION;  Surgeon: Serafina [name], MD;  Location: [facility] CATH LAB;  Service: Cardiovascular;  Laterality: Right;   carotid subclavian anastomosis  05/11/11   Right subclav. artery transected 12/17/09   CATARACT EXTRACTION     bilateral   EYE SURGERY      RIGHT HEART CATH N/A 09/01/2020   Procedure: RIGHT HEART CATH;  Surgeon: Larey Dresser, MD;  Location: [place] CV LAB;  Service: Cardiovascular;  Laterality: N/A;    Social History   Socioeconomic History   Marital status: Single    Spouse name: Not on file   Number of children: Not on file   Years of education: Not on file   Highest education level: Not on file  Occupational History   Not on file  Tobacco Use   Smoking status: Former    Packs/day: 0.30    Years: 5.00    Pack years: 1.50    Types: Cigarettes    Quit date: 04/12/1980    Years since quitting: 40.6   Smokeless tobacco: Never  Vaping Use   Vaping Use: Never used  Substance and Sexual Activity   Alcohol use: No   Drug use: No   Sexual activity: Not Currently  Other Topics Concern   Not on file  Social History Narrative   Not on file   Social Determinants of Health   Financial Resource Strain: Low Risk    Difficulty of Paying Living Expenses: Not very hard  Food Insecurity: No Food Insecurity   Worried About Running Out of Food in the Last Year: Never true   Ran Out of Food in the Last Year: Never true  Transportation Needs: No Transportation Needs   Lack of Transportation (Medical): No   Lack of Transportation (Non-Medical): No  Physical Activity: Not on file  Stress: Not on file  Social Connections: Not on file  Intimate Partner Violence: Not on file    Allergies  Allergen Reactions   Meclizine Nausea And Vomiting    Family History  Problem Relation Age of Onset   Heart failure Mother    Cancer Father    Breast cancer Neg Hx     Prior to Admission medications   Medication Sig Start Date End Date Taking? Authorizing Provider  acetaminophen (TYLENOL) 325 MG tablet Take 650 mg every 6 (six) hours as needed by mouth for mild pain or headache.     [provider]  albuterol (PROVENTIL) (2.5 MG/3ML) 0.083% nebulizer solution Take 3 mLs (2.5 mg total) by nebulization every 6 (six)  hours as needed for wheezing or shortness of breath. 08/15/20   Laurin Coder, MD  albuterol (VENTOLIN HFA) 108 (90 Base) MCG/ACT inhaler Inhale 2 puffs into the lungs every 6 (six) hours as needed for wheezing or shortness of breath. 09/18/20   [name], MD  amLODipine (NORVASC) 5 MG tablet TAKE 1 TABLET BY MOUTH EVERY DAY 10/23/20   [name], MD  arformoterol (BROVANA) 15 MCG/2ML NEBU Take 2 mLs (15 mcg total) by nebulization 2 (two) times daily. 08/15/20   Laurin Coder, MD  aspirin 81 MG chewable tablet Chew 81 mg by mouth daily.    [provider]  budesonide (PULMICORT) 0.5 MG/2ML nebulizer solution Take 2 mLs (0.5 mg total)  by nebulization in the morning and at bedtime. 08/15/20   Laurin Coder, MD  calcium-vitamin D (OSCAL WITH D) 500-200 MG-UNIT per tablet Take 1 tablet by mouth daily with breakfast.    [provider]  clopidogrel (PLAVIX) 75 MG tablet TAKE 1 TABLET BY MOUTH EVERY DAY 09/30/20   [name], MD  docusate sodium (COLACE) 100 MG capsule TAKE 1 CAPSULE BY MOUTH EVERY DAY AS NEEDED FOR MILD CONSTIPATION 10/10/20   [name], MD  fluticasone (FLONASE) 50 MCG/ACT nasal spray Place 1 spray into both nostrils daily as needed for allergies or rhinitis (for sinus congestion). 01/15/20   [name], MD  fluticasone-salmeterol (ADVAIR HFA) [id number] MCG/ACT inhaler INHALE 2 PUFFS INTO THE LUNGS TWICE A DAY 11/18/20   [name], MD  furosemide (LASIX) 20 MG tablet 1 tab PO every other day 09/18/20   [name], MD  gabapentin (NEURONTIN) 100 MG capsule TAKE 1 CAPSULE (100 MG TOTAL) BY MOUTH AT BEDTIME. 10/11/20   [name], MD  guaiFENesin-dextromethorphan (ROBITUSSIN DM) 100-10 MG/5ML syrup Take 10 mLs by mouth every 4 (four) hours as needed for cough. 05/04/20   Caren Griffins, MD  lansoprazole (PREVACID) 15 MG capsule Take 1 capsule (15 mg total) by mouth daily. 11/18/20   [name], MD   latanoprost (XALATAN) 0.005 % ophthalmic solution Place 1 drop at bedtime into both eyes. 01/15/17   [provider]  levothyroxine (SYNTHROID) 50 MCG tablet Take 1.5 tablets (75 mcg total) by mouth daily. 07/28/20   [name], MD  lisinopril (ZESTRIL) 20 MG tablet TAKE 1 TABLET BY MOUTH EVERY DAY 10/10/20   [name], MD  loratadine (CLARITIN) 10 MG tablet Take 1 tablet (10 mg total) by mouth daily as needed for allergies. 11/20/20   [name], MD  macitentan (OPSUMIT) 10 MG tablet Take 1 tablet (10 mg total) by mouth daily. This medicine will be mailed to your house. 09/07/20   Enzo Bi, MD  metoprolol succinate (TOPROL-XL) 25 MG 24 hr tablet TAKE 1/2 TABLET BY MOUTH EVERY DAY 09/22/20   [name], MD  Multiple Vitamins-Minerals (ALIVE WOMENS ENERGY) TABS Take 1 tablet by mouth daily.    [provider]  potassium chloride SA (KLOR-CON) 20 MEQ tablet Take 2 tablets (40 mEq total) by mouth daily. 09/26/20   Clegg, Amy D, NP  predniSONE (DELTASONE) 5 MG tablet Take 1 tablet (5 mg total) by mouth daily with breakfast. 11/14/20   Sherrilyn Rist A, MD  Probiotic Product (PROBIOTIC PO) Take 1 capsule by mouth daily.    [provider]  simvastatin (ZOCOR) 20 MG tablet TAKE 1 TABLET BY MOUTH EVERYDAY AT BEDTIME 08/20/20   [name], MD    Physical Exam: Vitals:   11/23/20 9373 11/23/20 0632 11/23/20 0730 11/23/20 0733  BP: 103/61  101/63   Pulse: 90  77   Resp: (!) 23  17   Temp:    98 F (36.7 C)  TempSrc:    Oral  SpO2: 91%  96%   Weight:  56.2 kg    Height:  _0  (1.6 m)       General:  Appears calm and comfortable and is in NAD, on NRB O2 Eyes:  EOMI, normal lids, iris ENT:  grossly normal hearing, lips & tongue, mmm; edentulous Neck:  no LAD, masses or thyromegaly Cardiovascular:  RRR, no m/r/g. No LE edema.  Respiratory:  CTA bilaterally with no wheezes/rales/rhonchi.  Increased respiratory effort in the mid-30s  consistently with O2 sats >92%. Abdomen:  soft, NT, ND Back:   normal alignment, no CVAT Skin:  no rash or induration seen on limited exam Musculoskeletal:  grossly normal tone BUE/BLE, good ROM, no bony abnormality Psychiatric:  grossly normal mood and affect, speech fluent and appropriate, AOx3 Neurologic:  CN 2-12 grossly intact, moves all extremities in coordinated fashion    Radiological Exams on Admission: Independently reviewed - see discussion in A/P where applicable  DG Chest 2 View  Result Date: 11/22/2020 CLINICAL DATA:  Shortness of breath EXAM: CHEST - 2 VIEW COMPARISON:  08/27/2020 FINDINGS: Cardiac shadow is within normal limits. Aortic calcifications are noted. No focal infiltrate or sizable effusion is seen. Chronic interstitial changes are noted in the bases. No bony abnormality is seen. IMPRESSION: No acute abnormality noted. Electronically Signed   By: Inez Catalina M.D.   On: 11/22/2020 23:19   CT Angio Chest PE W/Cm &/Or Wo Cm  Result Date: 11/23/2020 CLINICAL DATA:  80 year old female with shortness of breath. EXAM: CT ANGIOGRAPHY CHEST WITH CONTRAST TECHNIQUE: Multidetector CT imaging of the chest was performed using the standard protocol during bolus administration of intravenous contrast. Multiplanar CT image reconstructions and MIPs were obtained to evaluate the vascular anatomy. CONTRAST:  24m OMNIPAQUE IOHEXOL 350 MG/ML SOLN COMPARISON:  CT chest 08/30/2020, CTA chest 08/27/2020. Chest radiographs 11/22/2020. FINDINGS: Cardiovascular: Excellent contrast bolus timing in the pulmonary arterial tree. Mild respiratory motion. No focal filling defect identified in the pulmonary arteries to suggest acute pulmonary embolism. Extensive Calcified aortic atherosclerosis. Little aortic contrast today. No cardiomegaly or pericardial effusion. Calcified coronary artery atherosclerosis is extensive. Mediastinum/Nodes: Upper limits of normal to mildly enlarged mediastinal and hilar  lymph nodes are stable since May. No mediastinal mass or progressive lymphadenopathy. Lungs/Pleura: Confluent and enhancing dependent atelectasis in both lungs. Major airways remain patent. Lower lung volumes compared to May with increased asymmetric peripheral and distal peribronchial interstitial opacity in the right upper lobe (such as series 6, image 39). Underlying emphysema. No convincing consolidation. No pleural effusion. Upper Abdomen: Negative visible liver, gallbladder, spleen, pancreas, adrenal glands, kidneys and bowel in the upper abdomen. Musculoskeletal: Osteopenia. Mild chronic T10 and T11 compression fractures are stable since May. A mild to moderate L2 superior endplate compression fracture is stable since 2017. No acute osseous abnormality identified. Review of the MIP images confirms the above findings. IMPRESSION: 1. Negative for acute pulmonary embolus. 2. Emphysema (ICD10-J43.9) with lower lung volumes and dependent atelectasis. But new asymmetric right upper lobe interstitial opacity is suspicious for developing pneumonia. No pleural effusion. 3. Stable borderline to mildly enlarged mediastinal and bilateral hilar lymph nodes since May. 4. Calcified coronary artery and Aortic Atherosclerosis (ICD10-I70.0). Electronically Signed   By: HGenevie AnnM.D.   On: 11/23/2020 05:33    EKG: Independently reviewed.   2229 - Sinus tachycardia with rate 115; nonspecific ST changes with no evidence of acute ischemia 0054 - NSR with rate 96; no evidence of acute ischemia   Labs on Admission: I have personally reviewed the available labs and imaging studies at the time of the admission.  Pertinent labs:   Glucose 139 BNP 105.7, stable WBC 15.5 D-dimer 1.38 COVID/flu negative   Assessment/Plan Principal Problem:   Acute on chronic respiratory failure with hypoxia ([facility] Active Problems:   Carotid stenosis   Essential hypertension   Hypothyroidism   Pulmonary hypertension (HCC)    Vasomotor rhinitis  Acute on chronic respiratory failure with hypoxia -Patient with severe chronic respiratory failure at baseline, requiring 8-10L [place] O2 -She has had increased SOB and hypoxia; history is inconsistent but she appears to have had difficulty as far back as 8/5 since that is when prednisone 5 mg daily was started -Last night, she worsened and was unable to obtain O2 stabilization -54% despite [place] O2 with EMS arrival, currently still on NRB O2 -CXR unremarkable but chest CT appears to show developing RUL PNA -If infiltrate is the cause of decompensation, she will hopefully return to prior baseline - but possibly more slowly than patients without such severe underlying lung disease -Will admit to Progressive Care -Pulmonology consult requested  RUL PNA -Patient presenting with productive cough, decreased oxygen saturation, and possible developing infiltrate in right upper lobe on chest x-ray -Influenza negative. -COVID-19 negative. -CURB-65 score is 2. -Pneumonia Severity Index (PSI) is Class 4, 9% mortality. -She has been on daily prednisone since 8/5; will give Solumedrol 60 mg IV BID x 2 doses and then switch to prednisone 40 mg daily for now - or as per pulm recommendations. -Will start Azithromycin 500 mg IV daily and Rocephin -Repeat CBC in am -Will add albuterol PRN -Continue Robitussin  COPD/Pulmonary HTN -Severe disease at baseline -Continue home meds - Opsumit, Brovana, Pulmicort, Advair -Hold QOD Lasix for now  Hypothyroidism -Continue Synthroid at current dose for now   HTN -Borderline low BP while in the ER -Continue Toprol XL -Resume Lisinopril, Norvasc tomorrow  PVD -h/o R subclavian occlusion s/p carotid-subclavian bypass in 2011 -Also s/p R carotid stent in 2013 -Continue ASA, Plavix, Zocor  Rhinorrhea -Reports vasomotor rhinitis (continuous nasal drainage) as well as occasional epistaxis associated with supplemental O2 -Will add Atrovent NS  and nasal saline  Glaucoma -Continue Latanoprost  Goals of care -Patient has advanced lung disease with high O2 requirement at baseline -Very tenuous respiratory status, at risk for severe compromise -Currently full code, would desire mechanical ventilation in the event of respiratory arrest -Suggest ongoing discussions regarding [place] -She is likely a candidate for hospice and would likely benefit from palliative care consultation    Note: This patient has been tested and is negative for the novel coronavirus COVID-19. She has NOT been vaccinated against COVID-19.    DVT prophylaxis: Lovenox  Code Status:  Full - confirmed with patient/family Family Communication: Daughter was present throughout encounter Disposition Plan:  The patient is from: home  Anticipated d/c is to: home, possibly with [facility] services   Anticipated d/c date will depend on clinical response to treatment, but possibly as early as tomorrow if she has excellent response to treatment  Patient is currently: acutely ill Consults called: Pulmonology; PT/OT/RT/Nutrition/TOC team Admission status: Admit - It is my clinical opinion that admission to INPATIENT is reasonable and necessary because of the expectation that this patient will require hospital care that crosses at least 2 midnights to treat this condition based on the medical complexity of the problems presented.  Given the aforementioned information, the predictability of an adverse outcome is felt to be significant.      Karmen Bongo MD [facility]   How to contact the [facility] Attending or Consulting provider [place] or covering provider during after hours [place], for this patient?  Check the care team in [facility] and look for a) attending/consulting [facility] provider listed and b) the [facility] team listed Log into [email or web address] and use [place]'s universal password to access. If you do not  have the password, please contact the hospital operator. Locate the [facility]  provider you are looking for under [facility] and page to a number that you can be directly reached. If you still have difficulty reaching the provider, please page the [facility] (Director on Call) for the Hospitalists listed on amion for assistance.   11/23/2020, 8:49 AM

## 2020-11-23 NOTE — ED Notes (Signed)
Pt transferred to hospital bed for comfort.

## 2020-11-24 DIAGNOSIS — J9621 Acute and chronic respiratory failure with hypoxia: Secondary | ICD-10-CM | POA: Diagnosis not present

## 2020-11-24 LAB — CBC
HCT: 34.2 % — ABNORMAL LOW (ref 36.0–46.0)
Hemoglobin: 10.6 g/dL — ABNORMAL LOW (ref 12.0–15.0)
MCH: 29.4 pg (ref 26.0–34.0)
MCHC: 31 g/dL (ref 30.0–36.0)
MCV: 94.7 fL (ref 80.0–100.0)
Platelets: 265 10*3/uL (ref 150–400)
RBC: 3.61 MIL/uL — ABNORMAL LOW (ref 3.87–5.11)
RDW: 14.4 % (ref 11.5–15.5)
WBC: 13.6 10*3/uL — ABNORMAL HIGH (ref 4.0–10.5)
nRBC: 0 % (ref 0.0–0.2)

## 2020-11-24 LAB — BASIC METABOLIC PANEL
Anion gap: 8 (ref 5–15)
BUN: 17 mg/dL (ref 8–23)
CO2: 25 mmol/L (ref 22–32)
Calcium: 9.3 mg/dL (ref 8.9–10.3)
Chloride: 107 mmol/L (ref 98–111)
Creatinine, Ser: 0.81 mg/dL (ref 0.44–1.00)
GFR, Estimated: >60 mL/min (ref >60)
Glucose, Bld: 93 mg/dL (ref 70–99)
Potassium: 3.4 mmol/L — ABNORMAL LOW (ref 3.5–5.1)
Sodium: 140 mmol/L (ref 135–145)

## 2020-11-24 LAB — PROCALCITONIN: Procalcitonin: 0.7 ng/mL

## 2020-11-24 MED ORDER — POTASSIUM CHLORIDE CRYS ER 20 MEQ PO TBCR
40.0000 meq | EXTENDED_RELEASE_TABLET | Freq: Once | ORAL | Status: AC
  Administered 2020-11-24: 40 meq via ORAL
  Filled 2020-11-24: qty 2

## 2020-11-24 NOTE — Consult Note (Addendum)
Advanced Heart Failure Team Consult Note   Primary Physician: [name], MD PCP-Cardiologist:  Dr Aundra Dubin   Reason for Consultation: Pulmonary Hypertension  HPI:    [name] is seen today for evaluation of pulmonary hypertension at the request of Dr  Shearon Stalls.   [name] is a 80 year old [place] witness with a history of GERD, hypothyroid, COPD, R subclavian occlusion, S/P R carotid subclavian bypass 2011, r carotid stent 2013, pulmonary hypertension, chronic respiratory failure on 8 liters home oxygen.   Admitted 08/2020 with increased dyspnea in the setting of pulmonary hypertension. Required HFNC. Echo in 2021 showed normal LVEF 60 to 65% with elevated RVSP of 60 mmHg.  Echo repeated 5/22 and showed LVEF 60 to 65% but an increase in RVSP to 73 mmHg.  HF team consulted and had RHC that showed low filling pressures and PVR 7.6 WU. Started on macitentan and tadalafil.  CT chest in 5/22 showed no ILD, mild emphysema.  V/Q in 9/21 showed no chronic PE.  She was unable to perform PFTs.   Followed in the HF clinic and was last seen 11/03/20. She was on combination therapy with macitentan and tadalafil.  Due to increased shortness of breath, tadalafil was stopped and macitentan was continued. Volume status was stable and she was continued on lasix 20 mg every other day. Plan was to start tyvaso once approved. Tyvaso has not been started.    She reports difficulty with macitentan and reports chest tightness and her fingers turn blue about 4-5 minutes after taking it. Weight at home has been stable at 124 pounds. SOB with minimal exertion. Denies presyncope/syncope. She has been on 8-9 liters [place] at home.   On Saturday she developed worsening shortness of breath and had increased oxygen requirements. Oxygen saturation in 80s. CTA - negative for PE, ? RUE possible infiltrate,  and emphysema was noted. Started on IV antibiotics. Pertinent labs included: Sars 2 negative, BNP 105, WBC 15,  Creatinine 0.7, procalcitonin 0.7.   Currently on 15 liters [place]. CCM consulted.    Pertinent Cardiac Studies  Echo (5/22) with EF 60-65%, mild LVH, moderate RV enlargement, mildly decreased RV systolic function, PASP 73 mmHg. - High resolution CT chest (5/22) with no evidence for ILD, mild emphysema.  - Unable to complete PFTs - RHC (5/22): mean RA 1, PA 72/13 mean 36, mean PCWP 1, CI 2.88, PVR 7.6 WU.  - V/Q scan (9/21) was not suggestive of chronic PEs and lower extremity venous dopplers with no DVT.   - HIV negative, ACE level normal, anti-CCP negative, anti-Jo-1 negative, anti-SCL70 negative, ANCA negative, ANA negative, RF negative.      Review of Systems: [y] = yes, _0  = no   General: Weight gain _1 ; Weight loss _2 ; Anorexia _3 ; Fatigue [Y]; Fever _4 ; Chills _5 ; Weakness [ Y]  Cardiac: Chest pain/pressure _6 ; Resting SOB _7 ; Exertional SOB [ Y]; Orthopnea _8 ; Pedal Edema _9 ; Palpitations _10 ; Syncope _11 ; Presyncope _12 ; Paroxysmal nocturnal dyspnea_13   Pulmonary: Cough _14 ; Wheezing_15 ; Hemoptysis_16 ; Sputum _17 ; Snoring _18   GI: Vomiting_19 ; Dysphagia_20 ; Melena_21 ; Hematochezia _22 ; Heartburn_23 ; Abdominal pain _24 ; Constipation _25 ; Diarrhea _26 ; BRBPR _27   GU: Hematuria_28 ; Dysuria _29 ; Nocturia_30   Vascular: Pain in legs with walking _31 ; Pain in feet with lying flat _32 ; Non-healing sores _33 ;  Stroke _0 ; TIA _1 ; Slurred speech _2 ;  Neuro: Headaches_3 ; Vertigo_4 ; Seizures_5 ; Paresthesias_6 ;Blurred vision _7 ; Diplopia _8 ; Vision changes _9   Ortho/Skin: Arthritis _10 ; Joint pain [Y ]; Muscle pain _11 ; Joint swelling _12 ; Back Pain [ Y]; Rash _13   Psych: Depression_14 ; Anxiety_15   Heme: Bleeding problems _16 ; Clotting disorders _17 ; Anemia _18   Endocrine: Diabetes _19 ; Thyroid dysfunction[Y ]  Home Medications Prior to Admission medications   Medication Sig Start Date End Date Taking? Authorizing Provider  acetaminophen (TYLENOL) 325 MG tablet Take 650 mg  every 6 (six) hours as needed by mouth for mild pain or headache.    Yes [provider]  albuterol (PROVENTIL) (2.5 MG/3ML) 0.083% nebulizer solution Take 3 mLs (2.5 mg total) by nebulization every 6 (six) hours as needed for wheezing or shortness of breath. 08/15/20  Yes Olalere, Adewale A, MD  albuterol (VENTOLIN HFA) 108 (90 Base) MCG/ACT inhaler Inhale 2 puffs into the lungs every 6 (six) hours as needed for wheezing or shortness of breath. 09/18/20  Yes [name], MD  amLODipine (NORVASC) 5 MG tablet TAKE 1 TABLET BY MOUTH EVERY DAY 10/23/20  Yes [name], MD  arformoterol (BROVANA) 15 MCG/2ML NEBU Take 2 mLs (15 mcg total) by nebulization 2 (two) times daily. 08/15/20  Yes Olalere, Adewale A, MD  aspirin 81 MG chewable tablet Chew 81 mg by mouth daily.   Yes [provider]  budesonide (PULMICORT) 0.5 MG/2ML nebulizer solution Take 2 mLs (0.5 mg total) by nebulization in the morning and at bedtime. 08/15/20  Yes Olalere, Adewale A, MD  calcium-vitamin D (OSCAL WITH D) 500-200 MG-UNIT per tablet Take 1 tablet by mouth daily with breakfast.   Yes [provider]  clopidogrel (PLAVIX) 75 MG tablet TAKE 1 TABLET BY MOUTH EVERY DAY 09/30/20  Yes [name], MD  docusate sodium (COLACE) 100 MG capsule TAKE 1 CAPSULE BY MOUTH EVERY DAY AS NEEDED FOR MILD CONSTIPATION 10/10/20  Yes [name], MD  fluticasone (FLONASE) 50 MCG/ACT nasal spray Place 1 spray into both nostrils daily as needed for allergies or rhinitis (for sinus congestion). 01/15/20  Yes [name], MD  fluticasone-salmeterol (ADVAIR HFA) 210 039 9532 MCG/ACT inhaler INHALE 2 PUFFS INTO THE LUNGS TWICE A DAY Patient taking differently: Inhale 2 puffs into the lungs 2 (two) times daily. 11/18/20  Yes [name], MD  furosemide (LASIX) 20 MG tablet 1 tab PO every other day Patient taking differently: Take 20 mg by mouth See admin instructions. 1 tablet by mouth every other day 09/18/20   Yes [name], MD  gabapentin (NEURONTIN) 100 MG capsule TAKE 1 CAPSULE (100 MG TOTAL) BY MOUTH AT BEDTIME. 10/11/20  Yes [name], MD  guaiFENesin-dextromethorphan (ROBITUSSIN DM) 100-10 MG/5ML syrup Take 10 mLs by mouth every 4 (four) hours as needed for cough. 05/04/20  Yes Caren Griffins, MD  lansoprazole (PREVACID) 15 MG capsule Take 1 capsule (15 mg total) by mouth daily. 11/18/20  Yes [name], MD  latanoprost (XALATAN) 0.005 % ophthalmic solution Place 1 drop at bedtime into both eyes. 01/15/17  Yes [provider]  levothyroxine (SYNTHROID) 50 MCG tablet Take 1.5 tablets (75 mcg total) by mouth daily. 07/28/20  Yes [name], MD  loratadine (CLARITIN) 10 MG tablet Take 1 tablet (10 mg total) by mouth daily as needed for allergies. 11/20/20  Yes  [name], MD  macitentan (OPSUMIT) 10 MG tablet Take 1 tablet (10 mg total) by mouth daily. This medicine will be mailed to your house. 09/07/20  Yes Enzo Bi, MD  metoprolol succinate (TOPROL-XL) 25 MG 24 hr tablet TAKE 1/2 TABLET BY MOUTH EVERY DAY Patient taking differently: Take 12.5 mg by mouth daily. 09/22/20  Yes [name], MD  Multiple Vitamins-Minerals (ALIVE WOMENS ENERGY) TABS Take 1 tablet by mouth daily as needed (supplementation).   Yes [provider]  potassium chloride SA (KLOR-CON) 20 MEQ tablet Take 2 tablets (40 mEq total) by mouth daily. 09/26/20  Yes Clegg, Amy D, NP  predniSONE (DELTASONE) 5 MG tablet Take 1 tablet (5 mg total) by mouth daily with breakfast. 11/14/20  Yes Olalere, Adewale A, MD  Probiotic Product (PROBIOTIC PO) Take 1 capsule by mouth daily.   Yes [provider]  simvastatin (ZOCOR) 20 MG tablet TAKE 1 TABLET BY MOUTH EVERYDAY AT BEDTIME Patient taking differently: Take 20 mg by mouth at bedtime. TAKE 1 TABLET BY MOUTH EVERYDAY AT BEDTIME 08/20/20  Yes [name], MD  lisinopril (ZESTRIL) 20 MG tablet TAKE 1 TABLET BY MOUTH EVERY  DAY Patient not taking: Reported on 11/23/2020 10/10/20   [name], MD    Past Medical History: Past Medical History:  Diagnosis Date   Arthritis    Carotid artery occlusion    Chronic respiratory failure with hypoxia, on home O2 therapy (HCC)    COPD (chronic obstructive pulmonary disease) (HCC)    GERD (gastroesophageal reflux disease)    Glaucoma    Hypertension    Hypothyroidism (acquired)    Pulmonary hypertension (HCC)    Vasomotor rhinitis     Past Surgical History: Past Surgical History:  Procedure Laterality Date   ABDOMINAL HYSTERECTOMY     AORTIC ARCH ANGIOGRAPHY N/A 03/01/2017   Procedure: AORTIC ARCH ANGIOGRAPHY;  Surgeon: Serafina [name], MD;  Location: [facility];  Service: Cardiovascular;  Laterality: N/A;   ARCH AORTOGRAM N/A 05/04/2011   Procedure: ARCH AORTOGRAM;  Surgeon: Serafina [name], MD;  Location: [facility] CATH LAB;  Service: Cardiovascular;  Laterality: N/A;   ARCH AORTOGRAM N/A 07/04/2012   Procedure: ARCH AORTOGRAM;  Surgeon: Serafina [name], MD;  Location: [facility] CATH LAB;  Service: Cardiovascular;  Laterality: N/A;   CAROTID ANGIOGRAM  July 04, 2012   CAROTID ANGIOGRAM N/A 05/04/2011   Procedure: CAROTID Cyril Loosen;  Surgeon: Serafina [name], MD;  Location: [facility] CATH LAB;  Service: Cardiovascular;  Laterality: N/A;   CAROTID ANGIOGRAM N/A 07/04/2012   Procedure: CAROTID ANGIOGRAM;  Surgeon: Serafina [name], MD;  Location: [facility] CATH LAB;  Service: Cardiovascular;  Laterality: N/A;   CAROTID STENT INSERTION Right 05/11/2011   Procedure: CAROTID STENT INSERTION;  Surgeon: Serafina [name], MD;  Location: [facility] CATH LAB;  Service: Cardiovascular;  Laterality: Right;   carotid subclavian anastomosis  05/11/11   Right subclav. artery transected 12/17/09   CATARACT EXTRACTION     bilateral   EYE SURGERY     RIGHT HEART CATH N/A 09/01/2020   Procedure: RIGHT HEART CATH;  Surgeon: Larey Dresser, MD;  Location: [facility];  Service: Cardiovascular;   Laterality: N/A;    Family History: Family History  Problem Relation Age of Onset   Heart failure Mother    Cancer Father    Breast cancer Neg Hx     Social History: Social History   Socioeconomic History   Marital status:  Single    Spouse name: Not on file   Number of children: Not on file   Years of education: Not on file   Highest education level: Not on file  Occupational History   Not on file  Tobacco Use   Smoking status: Former    Packs/day: 0.30    Years: 5.00    Pack years: 1.50    Types: Cigarettes    Quit date: 04/12/1980    Years since quitting: 40.6   Smokeless tobacco: Never  Vaping Use   Vaping Use: Never used  Substance and Sexual Activity   Alcohol use: No   Drug use: No   Sexual activity: Not Currently  Other Topics Concern   Not on file  Social History Narrative   Not on file   Social Determinants of Health   Financial Resource Strain: Low Risk    Difficulty of Paying Living Expenses: Not very hard  Food Insecurity: No Food Insecurity   Worried About Running Out of Food in the Last Year: Never true   [place] in the Last Year: Never true  Transportation Needs: No Transportation Needs   Lack of Transportation (Medical): No   Lack of Transportation (Non-Medical): No  Physical Activity: Not on file  Stress: Not on file  Social Connections: Not on file    Allergies:  Allergies  Allergen Reactions   Meclizine Nausea And Vomiting    Objective:    Vital Signs:   Temp:  [97.7 F (36.5 C)] 97.7 F (36.5 C) (08/14 1215) Pulse Rate:  [62-99] 79 (08/15 0830) Resp:  [14-30] 24 (08/15 0830) BP: (96-124)/(54-71) 114/67 (08/15 0830) SpO2:  [90 %-100 %] 93 % (08/15 0830)    Weight change: Filed Weights   11/23/20 0632  Weight: 56.2 kg    Intake/Output:   Intake/Output Summary (Last 24 hours) at 11/24/2020 0908 Last data filed at 11/23/2020 1913 Gross per 24 hour  Intake 350 ml  Output 1300 ml  Net -950 ml      Physical  Exam    General:   No resp difficulty HEENT: normal Neck: supple. JVP 5-6  Carotids 2+ bilat; no bruits. No lymphadenopathy or thyromegaly appreciated. Cor: PMI nondisplaced. Regular rate & rhythm. No rubs, gallops or murmurs. Lungs: clear on 15 liters [place]  Abdomen: soft, nontender, nondistended. No hepatosplenomegaly. No bruits or masses. Good bowel sounds. Extremities: no cyanosis, clubbing, rash, edema Neuro: alert & orientedx3, cranial nerves grossly intact. moves all 4 extremities w/o difficulty. Affect pleasant   Telemetry  SR 80-90s personally    EKG  SR 96 bpm   Labs   Basic Metabolic Panel: Recent Labs  Lab 11/22/20 2300 11/24/20 0503  NA 141 140  K 4.1 3.4*  CL 109 107  CO2 22 25  GLUCOSE 139* 93  BUN 11 17  CREATININE 0.75 0.81  CALCIUM 9.3 9.3    Liver Function Tests: Recent Labs  Lab 11/23/20 1300  AST 34  ALT 35  ALKPHOS 58  BILITOT 1.2  PROT 6.5  ALBUMIN 2.7*   No results for input(s): LIPASE, AMYLASE in the last 168 hours. No results for input(s): AMMONIA in the last 168 hours.  CBC: Recent Labs  Lab 11/22/20 2300 11/24/20 0503  WBC 15.5* 13.6*  NEUTROABS 12.6*  --   HGB 11.5* 10.6*  HCT 37.7 34.2*  MCV 97.4 94.7  PLT 255 265    Cardiac Enzymes: No results for input(s): CKTOTAL, CKMB, CKMBINDEX, TROPONINI in the  last 168 hours.  BNP: BNP (last 3 results) Recent Labs    09/25/20 1459 11/03/20 1054 11/22/20 2300  BNP 77.0 102.4* 105.7*    ProBNP (last 3 results) No results for input(s): PROBNP in the last 8760 hours.   CBG: No results for input(s): GLUCAP in the last 168 hours.  Coagulation Studies: No results for input(s): LABPROT, INR in the last 72 hours.   Imaging   No results found.   Medications:     Current Medications:  amLODipine  5 mg Oral Daily   arformoterol  15 mcg Nebulization BID   aspirin  81 mg Oral Daily   budesonide  0.5 mg Nebulization BID   clopidogrel  75 mg Oral Daily   docusate  sodium  100 mg Oral BID   enoxaparin (LOVENOX) injection  40 mg Subcutaneous Daily   gabapentin  100 mg Oral QHS   ipratropium  2 spray Each Nare TID   latanoprost  1 drop Both Eyes QHS   levothyroxine  75 mcg Oral Daily   lisinopril  20 mg Oral Daily   metoprolol succinate  12.5 mg Oral Daily   pantoprazole  40 mg Oral Daily   potassium chloride  40 mEq Oral Once   saccharomyces boulardii  250 mg Oral Daily   simvastatin  20 mg Oral q1800   sodium chloride flush  3 mL Intravenous Q12H    Infusions:  azithromycin Stopped (11/23/20 1057)   cefTRIAXone (ROCEPHIN)  IV Stopped (11/23/20 1913)      Patient Profile   [name] is a 80 year old [place] witness with a history of GERD, hypothyroid, COPD, R subclavian occlusion, S/P R carotid subclavian bypass 2011, r carotid stent 2013, pulmonary hypertension, chronic respiratory failure on 8 liters home oxygen.   Assessment/Plan   A/C Respiratory Failure, multifactorial with PAH, COPD, ? PNA  -Chronically on 8 liters [place], multiple inhalers, and macitentan.   - On admit placed on NRB-->now on 15 liters with stable saturations.  -Afebrile, Procalcitonin is 0.7, WBC 15.  -Placed on IV antibiotics for possible PNA.  -Will given IV lasix x1 to see if this helps with oxygenation. BNP not elevated.   2. Pulmonary Hypertension   Echo (5/22) with EF 60-65%, mild LVH, moderate RV enlargement, mildly decreased RV systolic function, PASP 73 mmHg. High resolution CT chest with no evidence for ILD, mild emphysema, mediastinal LAN less prominent than on prior CT and likely reactive. She had a V/Q scan in 9/21 that was not suggestive of chronic PEs and lower extremity venous dopplers with no DVT.  Unable to obtain PFTs.  She has mild OSA.  Autoimmune serologies negative.  RHC in 5/22 with PAH, PVR 7.6 WU.  Cause of PAH is uncertain, concerned for component of group 1 PH as mild OSA and mild COPD did not appear to explain.  There has been some concern  that she could have sarcoidosis, but CT chest is not suggestive of this.  She has not responded well to macitentan and tadalafil. Tadalafil was stopped 11/03/20  - Tyvaso had been ordered on 7/25 but she has not started. - CTA this admit negative for PE, empysema, but possible RUL PNA.  -Given chest tightness with macitentan will stop. - Given age not sure inhaled therapy with help. Will need Palliative Care for [place].   3. Hypertension -Continue current toprol xl, lisinopril, and amlodipine.  Stable.   4. Hypothyroidism  Need to restart synthroid.   5. R subclavian  occlusion  S/P s/p right subclavian to right carotid anastomosis in 9/11.  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 - Continue ASA, plavix, and statin.   Will need involve Palliative Care for [place].   Length of Stay: 1  Amy Clegg, NP  11/24/2020, 9:08 AM  Advanced Heart Failure Team Pager [phone] (M-F; 7a - 5p)  Please contact [facility] for night-coverage after hours (4p -7a ) and weekends on [email or web address]  Given 40 mg IV lasix earlier today. ---> 500cc clear urine output noted.   Remains on 15 liters [place]. She understands we have limited options and is ok with Palliative Care consults for [place].   Amy Clegg NP-C  2:09 PM  Patient seen and examined with the above-signed Advanced Practice Provider and/or Housestaff. I personally reviewed laboratory data, imaging studies and relevant notes. I independently examined the patient and formulated the important aspects of the plan. I have edited the note to reflect any of my changes or salient points. I have personally discussed the plan with the patient and/or family.  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  Has been followed by Dr. Aundra Dubin. [place] data reviewed.   Patient with poor response to PDE-5 and ERA. Planned trial of Tyvaso but patient admitted with worsening respiratory symptoms and increased O2 requirements up to  15L. CT scan done. No PE. Developing RUL infiltrate. Moderate COPD. Stable mediastinal lymphadenopathy.   Received 1 dose of lasix today with moderate output   General:  Elderly frail appearing. Appears comfortable despite highO2 requirement HEENT: normal Neck: supple. nJVP 7  Carotids 2+ bilat; no bruits. No lymphadenopathy or thryomegaly appreciated. Cor: PMI nondisplaced. Regular rate & rhythm. No rubs, gallops or murmurs. Lungs: + crackles. No wheeze Abdomen: soft, nontender, nondistended. No hepatosplenomegaly. No bruits or masses. Good bowel sounds. Extremities: no cyanosis, clubbing, rash, edema Neuro: alert & orientedx3, cranial nerves grossly intact. moves all 4 extremities w/o difficulty. Affect pleasant  Difficult case. Agree with concern over WHO Group 1 +/- 3 PAH. However she has had very poor response to ERA and PDE-5 which concerns me for possible PVOD. I feel that she is unfortunately end-stage and do not think she will benefit significantly from Tyvaso.   At this point would aggressively treat possible PNA and diurese as tolerated. If no significant improvement, Palliative Care likely only option. Discussed with Dr. Aundra Dubin who knows her well and agrees.   Glori Bickers, MD  10:41 PM

## 2020-11-24 NOTE — Progress Notes (Signed)
[facility] Progress Note  Patient: [name]    OAC:[id number]  DOA: 11/22/2020     Date of Service: the patient was seen and examined on 11/24/2020  Brief hospital course: Past medical history of pulmonary HTN, COPD, chronic respiratory failure on 8 LPM at rest, hypothyroidism, generalized weakness but willing to receive blood product.  Presents with complaints of shortness of breath.  Found to have right lower lobe pneumonia.  Currently receiving IV antibiotics. Currently plan is monitor respiratory course.  Subjective: Continues to have shortness of breath although improved.  No nausea or vomiting.  No fever no chills.  No chest pain.  Had some swelling in the leg which is also improving.  No diarrhea.  Assessment and Plan: 1.  Acute on chronic hypoxic respiratory failure. Right upper lobe pneumonia. Pulmonary hypertension Presents with complaints of progressive cough and worsening hypoxia. Currently on 15 L oxygen. Chest x-ray shows evidence of pulmonary hypertension but CT scan shows evidence of right upper lobe pneumonia. Currently started on IV antibiotics. Pulmonary consulted. Recommend to continue current regimen.  No indication for steroids. Management per pulmonary hypertension per cardiology. Unable to tolerate macitentan.  Tyvaso has not been started. Palliative care consult for goals of care conversation. Continue pulmonary toilet.  Nebulizers as needed.  Continue inhalers.  2.  Hypothyroidism Continue Synthroid  3.  Essential hypertension Blood pressure soft on admission. Continue Toprol, Norvasc and lisinopril.  4.  PVD, subclavian occlusion Continue aspirin, Plavix, Zocor.  5.  Goals of care conversation Palliative care consulted.  Scheduled Meds:  amLODipine  5 mg Oral Daily   arformoterol  15 mcg Nebulization BID   aspirin  81 mg Oral Daily   budesonide  0.5 mg Nebulization BID   clopidogrel  75 mg Oral Daily   docusate sodium  100 mg Oral  BID   enoxaparin (LOVENOX) injection  40 mg Subcutaneous Daily   gabapentin  100 mg Oral QHS   ipratropium  2 spray Each Nare TID   latanoprost  1 drop Both Eyes QHS   levothyroxine  75 mcg Oral Daily   lisinopril  20 mg Oral Daily   metoprolol succinate  12.5 mg Oral Daily   pantoprazole  40 mg Oral Daily   saccharomyces boulardii  250 mg Oral Daily   simvastatin  20 mg Oral q1800   sodium chloride flush  3 mL Intravenous Q12H   Continuous Infusions:  azithromycin Stopped (11/24/20 1158)   cefTRIAXone (ROCEPHIN)  IV Stopped (11/23/20 1913)   PRN Meds: acetaminophen **OR** acetaminophen, albuterol, bisacodyl, diclofenac Sodium, guaiFENesin-dextromethorphan, hydrALAZINE, loratadine, morphine injection, ondansetron **OR** ondansetron (ZOFRAN) IV, oxyCODONE, polyethylene glycol, sodium chloride  Body mass index is 21.97 kg/m.        DVT Prophylaxis:   enoxaparin (LOVENOX) injection 40 mg Start: 11/23/20 1000    Advance goals of care discussion: Pt is Full code.  Family Communication: family was present at bedside, at the time of interview.  The pt provided permission to discuss medical plan with the family. Opportunity was given to ask question and all questions were answered satisfactorily.   Data Reviewed: I have personally reviewed and interpreted daily labs, tele strips, imaging. Mild hypokalemia.  Serum creatinine stable.  Procalcitonin 0.7.  WBC improving.  Hemoglobin stable.  Physical Exam:  General: Appear in mild distress, no Rash; Oral Mucosa Clear, moist. no Abnormal Neck Mass Or lumps, Conjunctiva normal  Cardiovascular: S1 and S2 Present, no Murmur, Respiratory: good respiratory effort, Bilateral Air entry  present and CTA, no Crackles, no wheezes Abdomen: Bowel Sound present, Soft and no tenderness Extremities: no Pedal edema Neurology: alert and oriented to time, place, and person affect appropriate. no new focal deficit Gait not checked due to patient safety  concerns   Vitals:   11/24/20 1200 11/24/20 1300 11/24/20 1400 11/24/20 1500  BP: 114/60 110/66 108/66 116/62  Pulse: 80 86 79 76  Resp: (!) 21 (!) 23 (!) 26 (!) 22  Temp:      TempSrc:      SpO2: 91% (!) 85% 96% 95%  Weight:      Height:        Disposition:  Status is: Inpatient  Remains inpatient appropriate because:Hemodynamically unstable  Dispo: The patient is from: Home              Anticipated d/c is to: Home              Patient currently is not medically stable to d/c.   Difficult to place patient No  Time spent: 35 minutes. I reviewed all nursing notes, pharmacy notes, vitals, pertinent old records. I have discussed plan of care as described above with RN.  Author: Berle Mull, MD [facility] 11/24/2020 3:47 PM  To reach On-call, see care teams to locate the attending and reach out via [email or web address]. Between 7PM-7AM, please contact night-coverage If you still have difficulty reaching the attending provider, please page the [facility] (Director on Call) for [facility] on amion for assistance.

## 2020-11-24 NOTE — Progress Notes (Signed)
NAME:  [name], MRN:  [id number], DOB:  1940-05-23, LOS: 1 ADMISSION DATE:  11/22/2020, CONSULTATION DATE: 11/23/2020 REFERRING MD: Hospitalist, CHIEF COMPLAINT: Hypoxia  History of Present Illness:  80 year old female with known chronic hypoxia pulmonary hypertension requiring 8 to 10 L nasal cannula daily basis.  Of note she had COVID January 2022 was asymptomatic.  Is been seen by Dr. Beckie Salts 11/14/2020 with a similar episode of shortness of breath.  She has been ruled out for sleep apnea.  He is unable to do pulmonary function test.  She is noted to be declining in health and becoming wheelchair-bound within the last month. She reports increasing shortness of breath x2 days positive for fever and chills without sputum production or cough.  She notes her O2 sats have decreased into the 70s and she presented to the hospital for further evaluation and treatment.  Seen by the hospitalist service started on antimicrobial therapy increasing oxygen to meet sats requirements of 90%.  She is awake alert no acute distress able to [facility] and follow commands.  We will agree with steroids antibiotics increasing oxygen and diuresis.  She has been pancultured and she will be admitted to the stepdown unit to be monitored appropriately.  Pertinent  Medical History   Past Medical History:  Diagnosis Date   Arthritis    Carotid artery occlusion    Chronic respiratory failure with hypoxia, on home O2 therapy (HCC)    COPD (chronic obstructive pulmonary disease) (HCC)    GERD (gastroesophageal reflux disease)    Glaucoma    Hypertension    Hypothyroidism (acquired)    Pulmonary hypertension (HCC)    Vasomotor rhinitis      Significant Hospital Events: Including procedures, antibiotic start and stop dates in addition to other pertinent events     Interim History / Subjective:  Patient feeling better today and reports it is easier to breath. No other acute complaints at this time.  Objective    Blood pressure 116/62, pulse 76, temperature 98 F (36.7 C), temperature source Oral, resp. rate (!) 22, height _0  (1.6 m), weight 56.2 kg, SpO2 95 %.        Intake/Output Summary (Last 24 hours) at 11/24/2020 1634 Last data filed at 11/24/2020 1158 Gross per 24 hour  Intake 350 ml  Output --  Net 350 ml   Filed Weights   11/23/20 1025  Weight: 56.2 kg    Examination: General: Elderly female no acute distress HENT: +JVD, moist mucous membranes, sclera anicteric Lungs: decreased breath sounds bilaterally. No wheezing or rhonchi. 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 - Decision of inhaled prostacyclin with tyvaso per heart failure team as this was the original plan as outpatient. Tyvaso is not available in the inpatient setting so would need to be setup at time of discharge. - Continue azithromycin and ceftriaxone for CAP coverage. Procal elevated - Urine strep and legionella ab ordered - Check extended respiratory viral panel. -  Continue nebulizer treatments. - Agree with palliative care consult.  Best Practice (right click and "Reselect all SmartList Selections" daily)   Diet/type: Regular consistency (see orders) DVT prophylaxis: other GI prophylaxis: PPI Lines: N/A Foley:  N/A Code Status:  full code Last date of multidisciplinary goals of care discussion [per primary]  Labs   CBC: Recent Labs  Lab  11/22/20 2300 11/24/20 0503  WBC 15.5* 13.6*  NEUTROABS 12.6*  --   HGB 11.5* 10.6*  HCT 37.7 34.2*  MCV 97.4 94.7  PLT 255 269    Basic Metabolic Panel: Recent Labs  Lab 11/22/20 2300 11/24/20 0503  NA 141 140  K 4.1 3.4*  CL 109 107  CO2 22 25  GLUCOSE 139* 93  BUN 11 17  CREATININE 0.75 0.81  CALCIUM 9.3 9.3   GFR: Estimated Creatinine Clearance: 44.3 mL/min (by C-G formula based on SCr of 0.81 mg/dL). Recent Labs  Lab 11/22/20 2300 11/24/20 0503  PROCALCITON  --  0.70  WBC 15.5* 13.6*    Liver Function Tests: Recent Labs  Lab 11/23/20 1300  AST 34  ALT 35  ALKPHOS 58  BILITOT 1.2  PROT 6.5  ALBUMIN 2.7*   No results for input(s): LIPASE, AMYLASE in the last 168 hours. No results for input(s): AMMONIA in the last 168 hours.  ABG    Component Value Date/Time   PHART 7.409 12/25/2019 2218   PCO2ART 30.1 (L) 12/25/2019 2218   PO2ART 73 (L) 12/25/2019 2218   HCO3 22.7 09/01/2020 0831   HCO3 22.9 09/01/2020 0831   TCO2 24 09/01/2020 0831   TCO2 24 09/01/2020 0831   ACIDBASEDEF 2.0 09/01/2020 0831   ACIDBASEDEF 2.0 09/01/2020 0831   O2SAT 73.0 09/01/2020 0831   O2SAT 75.0 09/01/2020 0831     Coagulation Profile: No results for input(s): INR, PROTIME in the last 168 hours.  Cardiac Enzymes: No results for input(s): CKTOTAL, CKMB, CKMBINDEX, TROPONINI in the last 168 hours.  HbA1C: Hgb A1c MFr Bld  Date/Time Value Ref Range Status  12/26/2019 07:09 AM 5.7 (H) 4.8 - 5.6 % Final    Comment:    (NOTE) Pre diabetes:          5.7%-6.4%  Diabetes:              >6.4%  Glycemic control for   <7.0% adults with diabetes   04/17/2008 10:40 PM (H) 4.6 - 6.1 % Final   6.2 (NOTE)   The ADA recommends the following therapeutic goal for glycemic   control related to Hgb A1C measurement:   Goal of Therapy:   < 7.0% Hgb A1C   Reference: American Diabetes Association: Clinical Practice   Recommendations 2008, Diabetes Care,  2008, 31:(Suppl 1).     CBG: No results for input(s): GLUCAP in the last 168 hours.   Critical care time:     Freda Jackson, MD [facility] Office: [phone]   See Amion for personal pager PCCM on call pager [phone] until 7pm. Please call Elink 7p-7a. [phone]

## 2020-11-24 NOTE — ED Notes (Signed)
Attempt report x1

## 2020-11-24 NOTE — ED Notes (Signed)
Report given to Laureen Ochs, RN

## 2020-11-24 NOTE — ED Notes (Signed)
Attempted to call report to [phone].

## 2020-11-25 DIAGNOSIS — J9621 Acute and chronic respiratory failure with hypoxia: Secondary | ICD-10-CM | POA: Diagnosis not present

## 2020-11-25 LAB — STREP PNEUMONIAE URINARY ANTIGEN: Strep Pneumo Urinary Antigen: NEGATIVE

## 2020-11-25 LAB — PROCALCITONIN: Procalcitonin: 0.52 ng/mL

## 2020-11-25 MED ORDER — ADULT MULTIVITAMIN W/MINERALS CH
1.0000 | ORAL_TABLET | Freq: Every day | ORAL | Status: DC
  Administered 2020-11-25 – 2020-11-29 (×5): 1 via ORAL
  Filled 2020-11-25 (×5): qty 1

## 2020-11-25 MED ORDER — FUROSEMIDE 40 MG PO TABS
40.0000 mg | ORAL_TABLET | Freq: Every day | ORAL | Status: DC
  Administered 2020-11-25 – 2020-11-29 (×5): 40 mg via ORAL
  Filled 2020-11-25 (×5): qty 1

## 2020-11-25 MED ORDER — ENSURE ENLIVE PO LIQD
237.0000 mL | Freq: Two times a day (BID) | ORAL | Status: DC
  Administered 2020-11-26 (×2): 237 mL via ORAL
  Filled 2020-11-25: qty 237

## 2020-11-25 NOTE — Progress Notes (Signed)
Pt unable to sustain with non-rebreather, SPO2 drops when eating and as well as with any kind of movement. Attending notified , he came down and assessed the pt ,and new orders were placed  for heated high flow. Pt was educated, by the attending regarding her condition and the new plan of care.

## 2020-11-25 NOTE — Progress Notes (Addendum)
Advanced Heart Failure Rounding Note  PCP-Cardiologist: None   Subjective:    Yesterday diuresed with 40 mg IV lasix x1 with >1 liter urine output. Macitentan stopped due to intolerance. .   Palliative Care consulted.    Feels ok.   Objective:   Weight Range: 56.2 kg Body mass index is 21.97 kg/m.   Vital Signs:   Temp:  [97.5 F (36.4 C)-98.2 F (36.8 C)] 98.2 F (36.8 C) (08/16 0700) Pulse Rate:  [68-86] 78 (08/16 0700) Resp:  [16-29] 19 (08/16 0700) BP: (101-122)/(50-74) 101/55 (08/16 0700) SpO2:  [85 %-98 %] 98 % (08/16 0700)    Weight change: Filed Weights   11/23/20 1216  Weight: 56.2 kg    Intake/Output:   Intake/Output Summary (Last 24 hours) at 11/25/2020 0800 Last data filed at 11/25/2020 0600 Gross per 24 hour  Intake 350 ml  Output 400 ml  Net -50 ml      Physical Exam    General:  Sitting in the chair. No resp difficulty HEENT: Normal Neck: Supple. JVP 6-7  Carotids 2+ bilat; no bruits. No lymphadenopathy or thyromegaly appreciated. Cor: PMI nondisplaced. Regular rate & rhythm. No rubs, gallops or murmurs. Lungs: Clear on 12 liter HFNC Abdomen: Soft, nontender, nondistended. No hepatosplenomegaly. No bruits or masses. Good bowel sounds. Extremities: No cyanosis, clubbing, rash, edema Neuro: Alert & orientedx3, cranial nerves grossly intact. moves all 4 extremities w/o difficulty. Affect pleasant   Telemetry   SR 70-90s personally reviewed.   EKG    N/A  Labs    CBC Recent Labs    11/22/20 2300 11/24/20 0503  WBC 15.5* 13.6*  NEUTROABS 12.6*  --   HGB 11.5* 10.6*  HCT 37.7 34.2*  MCV 97.4 94.7  PLT 255 244   Basic Metabolic Panel Recent Labs    11/22/20 2300 11/24/20 0503  NA 141 140  K 4.1 3.4*  CL 109 107  CO2 22 25  GLUCOSE 139* 93  BUN 11 17  CREATININE 0.75 0.81  CALCIUM 9.3 9.3   Liver Function Tests Recent Labs    11/23/20 1300  AST 34  ALT 35  ALKPHOS 58  BILITOT 1.2  PROT 6.5  ALBUMIN 2.7*    No results for input(s): LIPASE, AMYLASE in the last 72 hours. Cardiac Enzymes No results for input(s): CKTOTAL, CKMB, CKMBINDEX, TROPONINI in the last 72 hours.  BNP: BNP (last 3 results) Recent Labs    09/25/20 1459 11/03/20 1054 11/22/20 2300  BNP 77.0 102.4* 105.7*    ProBNP (last 3 results) No results for input(s): PROBNP in the last 8760 hours.   D-Dimer Recent Labs    11/23/20 0041  DDIMER 1.38*   Hemoglobin A1C No results for input(s): HGBA1C in the last 72 hours. Fasting Lipid Panel No results for input(s): CHOL, HDL, LDLCALC, TRIG, CHOLHDL, LDLDIRECT in the last 72 hours. Thyroid Function Tests No results for input(s): TSH, T4TOTAL, T3FREE, THYROIDAB in the last 72 hours.  Invalid input(s): FREET3  Other results:   Imaging    No results found.   Medications:     Scheduled Medications:  amLODipine  5 mg Oral Daily   arformoterol  15 mcg Nebulization BID   aspirin  81 mg Oral Daily   budesonide  0.5 mg Nebulization BID   clopidogrel  75 mg Oral Daily   docusate sodium  100 mg Oral BID   enoxaparin (LOVENOX) injection  40 mg Subcutaneous Daily   gabapentin  100 mg Oral  QHS   ipratropium  2 spray Each Nare TID   latanoprost  1 drop Both Eyes QHS   levothyroxine  75 mcg Oral Daily   lisinopril  20 mg Oral Daily   metoprolol succinate  12.5 mg Oral Daily   pantoprazole  40 mg Oral Daily   saccharomyces boulardii  250 mg Oral Daily   simvastatin  20 mg Oral q1800   sodium chloride flush  3 mL Intravenous Q12H    Infusions:  azithromycin Stopped (11/24/20 1158)   cefTRIAXone (ROCEPHIN)  IV Stopped (11/24/20 2320)    PRN Medications: acetaminophen **OR** acetaminophen, albuterol, bisacodyl, diclofenac Sodium, guaiFENesin-dextromethorphan, hydrALAZINE, loratadine, morphine injection, ondansetron **OR** ondansetron (ZOFRAN) IV, oxyCODONE, polyethylene glycol, sodium chloride    Patient Profile   80 y/o woman as above with COPD, HTN and  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   Patient with poor response to PDE-5 and ERA.- stopped.  Assessment/Plan     A/C Respiratory Failure, multifactorial with PAH, COPD, ? PNA  -Chronically on 8 liters [place], multiple inhalers, and macitentan.   - On admit placed on NRB-->now on 12 liters HFNC with stable saturations.  -Afebrile, Procalcitonin is 0.7 -  WBC 15-->13 .  -Placed on IV antibiotics for possible PNA => ceftriaxone/azithromycin -Decent response to one time dose of IV lasix. Will start po lasix 40 mg daily. Renal function stable. At home she was taking 20 mg po lasix every other day.    2. Pulmonary Hypertension   Echo (5/22) with EF 60-65%, mild LVH, moderate RV enlargement, mildly decreased RV systolic function, PASP 73 mmHg. High resolution CT chest with no evidence for ILD, mild emphysema, mediastinal LAN less prominent than on prior CT and likely reactive. She had a V/Q scan in 9/21 that was not suggestive of chronic PEs and lower extremity venous dopplers with no DVT.  Unable to obtain PFTs.  She has mild OSA.  Autoimmune serologies negative.  RHC in 5/22 with PAH, PVR 7.6 WU.  Cause of PAH is uncertain, concerned for component of group 1 PH as mild OSA and mild COPD did not appear to explain.  There has been some concern that she could have sarcoidosis, but CT chest is not suggestive of this.  She has not responded well to macitentan and tadalafil. Tadalafil was stopped 11/03/20  - Tyvaso had been ordered on 7/25 but she has not started and at this point not sure it will help.  - CTA this admit negative for PE, empysema, but possible RUL PNA.  -Macitentan stopped due to intolerance. . - Given age not sure inhaled therapy with help. Will need Palliative Care for [place].    3. Hypertension -Continue current toprol xl, lisinopril, and amlodipine.  Stable.    4. Hypothyroidism  Continue levothyroxine. Need to restart synthroid.    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 - Continue ASA, plavix, and statin.   Palliative Care consulted for [place].  Consult PT.   Length of Stay: 2  Amy Clegg, NP  11/25/2020, 8:00 AM  Advanced Heart Failure Team Pager [phone] (M-F; 7a - 5p)  Please contact [facility] for night-coverage after hours (5p -7a ) and weekends on [email or web address]  Patient seen with NP, agree with the above note.   She feels better this morning, had IV Lasix yesterday.  Continues on ceftriaxone/azithromycin for possible RUL PNA.  Still  on 12 L HFNC (uses 8 L at home).    General: NAD Neck: No JVD, no thyromegaly or thyroid nodule.  Lungs: Mild dry crackles at bases. CV: Nondisplaced PMI.  Heart regular S1/S2, no S3/S4, no murmur.  No peripheral edema.   Abdomen: Soft, nontender, no hepatosplenomegaly, no distention.  Skin: Intact without lesions or rashes.  Neurologic: Alert and oriented x 3.  Psych: Normal affect. Extremities: No clubbing or cyanosis.  HEENT: Normal.   Patient has worsened clinically with use of macitentan and with use of tadalafil, now off both.  She has severe pulmonary hypertension, not explained by her degree of parenchymal lung disease on CT (mild emphysema).  I am concerned about the possbility that she has PVOD.  I would, therefore, not try her on additional selective pulmonary vasodilators (will not start Tyvaso).  I am afraid that we are going to be left with symptomatic treatment only at this point => oxygen, po Lasix (will use 40 mg daily), and treatment of PNA with abx.  Reasonable to involve palliative care service.   I will also stop lisinopril given relatively low BP.   Loralie Champagne 11/25/2020 9:55 AM

## 2020-11-25 NOTE — Progress Notes (Signed)
NAME:  [name], MRN:  [id number], DOB:  [birth year], LOS: 2 ADMISSION DATE:  11/22/2020, CONSULTATION DATE: 11/23/2020 REFERRING MD: Hospitalist, CHIEF COMPLAINT: Hypoxia  History of Present Illness:  80 year old female with known chronic hypoxia pulmonary hypertension requiring 8 to 10 L nasal cannula daily basis.  Of note she had COVID January 2022 was asymptomatic.  Is been seen by Dr. Beckie Salts 11/14/2020 with a similar episode of shortness of breath.  She has been ruled out for sleep apnea.  He is unable to do pulmonary function test.  She is noted to be declining in health and becoming wheelchair-bound within the last month. She reports increasing shortness of breath x2 days positive for fever and chills without sputum production or cough.  She notes her O2 sats have decreased into the 70s and she presented to the hospital for further evaluation and treatment.  Seen by the hospitalist service started on antimicrobial therapy increasing oxygen to meet sats requirements of 90%.  She is awake alert no acute distress able to [facility] and follow commands.  We will agree with steroids antibiotics increasing oxygen and diuresis.  She has been pancultured and she will be admitted to the stepdown unit to be monitored appropriately.  Pertinent  Medical History   Past Medical History:  Diagnosis Date   Arthritis    Carotid artery occlusion    Chronic respiratory failure with hypoxia, on home O2 therapy (HCC)    COPD (chronic obstructive pulmonary disease) (HCC)    GERD (gastroesophageal reflux disease)    Glaucoma    Hypertension    Hypothyroidism (acquired)    Pulmonary hypertension ([place])    Vasomotor rhinitis      Significant Hospital Events: Including procedures, antibiotic start and stop dates in addition to other pertinent events     Interim History / Subjective:   Patient continues to feel better. No acute complaints.   Objective   Blood pressure (!) 101/55, pulse 78, temperature  98.2 F (36.8 C), temperature source Oral, resp. rate (!) 24, height _0  (1.6 m), weight 56.2 kg, SpO2 (!) 89 %.        Intake/Output Summary (Last 24 hours) at 11/25/2020 1047 Last data filed at 11/25/2020 0600 Gross per 24 hour  Intake 350 ml  Output 400 ml  Net -50 ml   Filed Weights   11/23/20 8841  Weight: 56.2 kg    Examination: General: Elderly female no acute distress HENT: +JVD, moist mucous membranes, sclera anicteric Lungs: decreased breath sounds bilaterally. No wheezing or rhonchi. 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 - Concern for PVOD, so all pulmonary vasodilators are being held. - Continue azithromycin and ceftriaxone for CAP coverage. Procal elevated. Urine strep negative. Recommend completing 5 day course for each antibiotic. - Check extended respiratory viral panel. - Continue nebulizer treatments. - Agree with palliative care consult.  Best Practice (right click and "Reselect all SmartList Selections" daily)   Diet/type: Regular consistency (see orders) DVT prophylaxis: other GI prophylaxis:  PPI Lines: N/A Foley:  N/A Code Status:  full code Last date of multidisciplinary goals of care discussion [per primary]  Labs   CBC: Recent Labs  Lab 11/22/20 2300 11/24/20 0503  WBC 15.5* 13.6*  NEUTROABS 12.6*  --   HGB 11.5* 10.6*  HCT 37.7 34.2*  MCV 97.4 94.7  PLT 255 132    Basic Metabolic  Panel: Recent Labs  Lab 11/22/20 2300 11/24/20 0503  NA 141 140  K 4.1 3.4*  CL 109 107  CO2 22 25  GLUCOSE 139* 93  BUN 11 17  CREATININE 0.75 0.81  CALCIUM 9.3 9.3   GFR: Estimated Creatinine Clearance: 44.3 mL/min (by C-G formula based on SCr of 0.81 mg/dL). Recent Labs  Lab 11/22/20 2300 11/24/20 0503 11/25/20 0258  PROCALCITON  --  0.70 0.52  WBC 15.5* 13.6*  --     Liver Function Tests: Recent Labs  Lab 11/23/20 1300  AST 34  ALT 35  ALKPHOS 58  BILITOT 1.2  PROT 6.5  ALBUMIN 2.7*   No results for input(s): LIPASE, AMYLASE in the last 168 hours. No results for input(s): AMMONIA in the last 168 hours.  ABG    Component Value Date/Time   PHART 7.409 12/25/2019 2218   PCO2ART 30.1 (L) 12/25/2019 2218   PO2ART 73 (L) 12/25/2019 2218   HCO3 22.7 09/01/2020 0831   HCO3 22.9 09/01/2020 0831   TCO2 24 09/01/2020 0831   TCO2 24 09/01/2020 0831   ACIDBASEDEF 2.0 09/01/2020 0831   ACIDBASEDEF 2.0 09/01/2020 0831   O2SAT 73.0 09/01/2020 0831   O2SAT 75.0 09/01/2020 0831     Coagulation Profile: No results for input(s): INR, PROTIME in the last 168 hours.  Cardiac Enzymes: No results for input(s): CKTOTAL, CKMB, CKMBINDEX, TROPONINI in the last 168 hours.  HbA1C: Hgb A1c MFr Bld  Date/Time Value Ref Range Status  12/26/2019 07:09 AM 5.7 (H) 4.8 - 5.6 % Final    Comment:    (NOTE) Pre diabetes:          5.7%-6.4%  Diabetes:              >6.4%  Glycemic control for   <7.0% adults with diabetes   04/17/2008 10:40 PM (H) 4.6 - 6.1 % Final   6.2 (NOTE)   The ADA recommends the following therapeutic goal for glycemic   control related to Hgb A1C measurement:   Goal of Therapy:   < 7.0% Hgb A1C   Reference: American Diabetes Association: Clinical Practice   Recommendations 2008, Diabetes Care,  2008, 31:(Suppl 1).    CBG: No results for input(s): GLUCAP in the last 168 hours.   Critical care time:     Freda Jackson, MD [facility] Office: [phone]   See Amion for personal pager PCCM on call pager [phone] until 7pm. Please call Elink 7p-7a. [phone]

## 2020-11-25 NOTE — Progress Notes (Signed)
[facility] Progress Note  Patient: [name]    KCL:[id number]  DOA: 11/22/2020     Date of Service: the patient was seen and examined on 11/25/2020  Brief hospital course: Past medical history of pulmonary HTN, COPD, chronic respiratory failure on 8 LPM at rest, hypothyroidism, generalized weakness but willing to receive blood product.  Presents with complaints of shortness of breath.  Found to have right lower lobe pneumonia.  Currently receiving IV antibiotics. 8/16 needing more oxygen now here on heated high flow Currently plan is monitor respiratory course.  Subjective: Continues to have shortness of breath.  No nausea no vomiting.  Feeling better.  No cough.  Was on 15 LPM [place] and 15 L NRB and still was getting hypoxic with minimal exertion.  Assessment and Plan: 1.  Acute on chronic hypoxic respiratory failure. Right upper lobe pneumonia. Pulmonary hypertension Presents with complaints of progressive cough and worsening hypoxia. At home on 8 LPM.  Currently on 15 LPM [place] and 15 L NRB.  Desaturating to 80s with minimal exertion. Switching to heated high flow. Chest x-ray shows evidence of pulmonary hypertension but CT scan shows evidence of right upper lobe pneumonia. Currently started on IV antibiotics. Pulmonary following. Management of pulmonary hypertension per cardiology. Unable to tolerate macitentan.  Tyvaso has not been started. Palliative care consult for goals of care conversation. Continue pulmonary toilet.  Nebulizers as needed.  Continue inhalers.  2.  Hypothyroidism Continue Synthroid  3.  Essential hypertension Blood pressure soft on admission. Continue Toprol, Norvasc and lisinopril.  4.  PVD, subclavian occlusion Continue aspirin, Plavix, Zocor.  5.  Goals of care conversation Palliative care consulted. Discussed with patient's in detail in presence of the sister.  Not a good candidate for intubation.  Recommend against it.  Sister currently  agrees: Patient wants to discuss with another sister.  Scheduled Meds:  amLODipine  5 mg Oral Daily   arformoterol  15 mcg Nebulization BID   aspirin  81 mg Oral Daily   budesonide  0.5 mg Nebulization BID   clopidogrel  75 mg Oral Daily   docusate sodium  100 mg Oral BID   enoxaparin (LOVENOX) injection  40 mg Subcutaneous Daily   feeding supplement  237 mL Oral BID BM   furosemide  40 mg Oral Daily   gabapentin  100 mg Oral QHS   ipratropium  2 spray Each Nare TID   latanoprost  1 drop Both Eyes QHS   levothyroxine  75 mcg Oral Daily   metoprolol succinate  12.5 mg Oral Daily   multivitamin with minerals  1 tablet Oral Daily   pantoprazole  40 mg Oral Daily   saccharomyces boulardii  250 mg Oral Daily   simvastatin  20 mg Oral q1800   sodium chloride flush  3 mL Intravenous Q12H   Continuous Infusions:  azithromycin 500 mg (11/25/20 1022)   cefTRIAXone (ROCEPHIN)  IV 2 g (11/25/20 1728)   PRN Meds: acetaminophen **OR** acetaminophen, albuterol, bisacodyl, diclofenac Sodium, guaiFENesin-dextromethorphan, hydrALAZINE, loratadine, morphine injection, ondansetron **OR** ondansetron (ZOFRAN) IV, oxyCODONE, polyethylene glycol, sodium chloride  Body mass index is 21.97 kg/m.  Nutrition Problem: Increased nutrient needs Etiology: acute illness (CAP)     DVT Prophylaxis:   enoxaparin (LOVENOX) injection 40 mg Start: 11/23/20 1000    Advance goals of care discussion: Pt is Full code.  Family Communication: family was present at bedside, at the time of interview.  The pt provided permission to discuss medical plan with  the family. Opportunity was given to ask question and all questions were answered satisfactorily.   Data Reviewed: I have personally reviewed and interpreted daily labs, tele strips, imaging.  Physical Exam:  General: Appear in mild distress, no Rash; Oral Mucosa Clear, moist. no Abnormal Neck Mass Or lumps, Conjunctiva normal  Cardiovascular: S1 and S2  Present, no Murmur, Respiratory: increased respiratory effort, Bilateral Air entry present and bilateral  Crackles, no wheezes Abdomen: Bowel Sound present, Soft and no tenderness Extremities: trace Pedal edema Neurology: alert and oriented to time, place, and person affect appropriate. no new focal deficit Gait not checked due to patient safety concerns    Vitals:   11/25/20 0600 11/25/20 0700 11/25/20 0955 11/25/20 1612  BP: (!) 114/53 (!) 101/55    Pulse: 74 78  91  Resp: 18 19 (!) 24   Temp:  98.2 F (36.8 C)    TempSrc:  Oral    SpO2: 95% 98% (!) 89% 94%  Weight:      Height:        Disposition:  Status is: Inpatient  Remains inpatient appropriate because:Hemodynamically unstable  Dispo: The patient is from: Home              Anticipated d/c is to: Home              Patient currently is not medically stable to d/c.   Difficult to place patient No  Time spent: 35 minutes. I reviewed all nursing notes, pharmacy notes, vitals, pertinent old records. I have discussed plan of care as described above with RN.  Author: Berle Mull, MD [facility] 11/25/2020 6:51 PM  To reach On-call, see care teams to locate the attending and reach out via [email or web address]. Between 7PM-7AM, please contact night-coverage If you still have difficulty reaching the attending provider, please page the [facility] (Director on Call) for [facility] on amion for assistance.

## 2020-11-25 NOTE — Evaluation (Signed)
Occupational Therapy Evaluation Patient Details Name: [name] MRN: [id number] DOB: 05-Jul-1940 Today's Date: 11/25/2020    History of Present Illness Pt is a 80 yr old female who preented to ED as increase in SOB. Pt reports pt is normally on 8L at rest and 10L with movment in the home but noted in the last week they started to have changes in o2 readings into the 80s. PMH: CAD, COPD, glacoma, pulmonary HTN   Clinical Impression   Pt admitted due to SOB. Pt reported they have family present through the day for IADLS/ADL assistance. Pt reports at prior level normally only ambulates with no DME but limited to home level. Pt reports when they go out of the home they will use a transport chair with family but will only go out for MD appointments. Pt in session was limited by 02 readings as with positional movements would go into the 80s when on 12L of o2 via [place] and needs increase time to complete each task as would recover into 90s with breathing techniques. Pt and family were educated about compensations on how to complete tasks to decrease in demand and DME/AE . Pt currently with functional limitations due to the deficits listed below (see OT Problem List).  Pt will benefit from skilled OT to increase their safety and independence with ADL and functional mobility for ADL to facilitate discharge to venue listed below.      Follow Up Recommendations  Home health OT;Supervision/Assistance - 24 hour (pt reprots they preffer home level at this time but educated about SNF level for maximize endurance)    Equipment Recommendations  Tub/shower bench;3 in 1 bedside commode    Recommendations for Other Services       Precautions / Restrictions Precautions Precautions: Fall;Other (comment) (watch o2) Restrictions Weight Bearing Restrictions: No      Mobility Bed Mobility Overal bed mobility: Needs Assistance Bed Mobility: Rolling;Supine to Sit Rolling: Supervision   Supine to sit:  Supervision     General bed mobility comments: pt cued on positioning due to breathing techniques    Transfers Overall transfer level: Needs assistance Equipment used: 1 person hand held assist Transfers: Sit to/from Stand Sit to Stand: Min guard         General transfer comment: pt requires increase in time for energy conservation techniques    Balance                                           ADL either performed or assessed with clinical judgement   ADL Overall ADL's : Needs assistance/impaired Eating/Feeding: Set up;Sitting   Grooming: Wash/dry hands;Wash/dry face;Min guard;Sitting;Cueing for safety;Cueing for sequencing;Cueing for compensatory techniques   Upper Body Bathing: Minimal assistance;Cueing for safety;Cueing for sequencing;Sitting   Lower Body Bathing: Moderate assistance;Cueing for safety;Cueing for sequencing;Sit to/from stand   Upper Body Dressing : Minimal assistance;Cueing for compensatory techniques;Cueing for sequencing;Cueing for safety;Sitting   Lower Body Dressing: Moderate assistance;Cueing for safety;Cueing for sequencing;Sit to/from stand   Toilet Transfer: Minimal assistance;Cueing for safety;Cueing for sequencing;Regular Toilet   Toileting- Clothing Manipulation and Hygiene: Moderate assistance;Sit to/from stand;Cueing for safety;Cueing for sequencing;Cueing for compensatory techniques       Functional mobility during ADLs: Min guard;Cueing for safety;Cueing for sequencing (hand held assist as needed but due to o2 at this time limited ambulation to stand pivot to chair) General ADL Comments: pt  needs cues for pacing and energy conversation techniques     Vision Baseline Vision/History: Wears glasses Wears Glasses: At all times Patient Visual Report: No change from baseline       Perception     Praxis      Pertinent Vitals/Pain Pain Assessment: No/denies pain     Hand Dominance Right   Extremity/Trunk  Assessment Upper Extremity Assessment Upper Extremity Assessment: Generalized weakness   Lower Extremity Assessment Lower Extremity Assessment: Defer to PT evaluation   Cervical / Trunk Assessment Cervical / Trunk Assessment: Kyphotic   Communication Communication Communication: No difficulties   Cognition Arousal/Alertness: Awake/alert Behavior During Therapy: WFL for tasks assessed/performed Overall Cognitive Status: Within Functional Limits for tasks assessed                                     General Comments       Exercises     Shoulder Instructions      Home Living Family/patient expects to be discharged to:: Private residence Living Arrangements: Other relatives Available Help at Discharge: Available 24 hours/day;Family Type of Home: House Home Access: Level entry     Home Layout: One level     Bathroom Shower/Tub: Retail buyer Accessibility: Yes       Additional Comments: Pt reports that family will be there all day and completes all meal preperation and will walk with pt to bathroom and daughter assists with bathing/dressing      Prior Functioning/Environment Level of Independence: Needs assistance  Gait / Transfers Assistance Needed: walks without AD ADL's / Homemaking Assistance Needed: daughter assists with showers, meals and transportation   Comments: pt was using transport chair for any out of the house mobility, family present with mobility but no AE/DME used        OT Problem List: Decreased strength;Decreased activity tolerance;Impaired balance (sitting and/or standing);Decreased safety awareness      OT Treatment/Interventions: Self-care/ADL training;Therapeutic exercise;Energy conservation;DME and/or AE instruction;Therapeutic activities;Patient/family education;Balance training    OT Goals(Current goals can be found in the care plan section) Acute Rehab OT Goals Patient Stated  Goal: to breath better OT Goal Formulation: With patient Time For Goal Achievement: 12/13/20 Potential to Achieve Goals: Good ADL Goals Pt Will Perform Grooming: with modified independence;sitting Pt Will Perform Upper Body Bathing: with set-up;sitting Pt Will Perform Lower Body Bathing: with min assist;sit to/from stand Pt Will Transfer to Toilet: with supervision;ambulating;regular height toilet  OT Frequency: Min 2X/week   Barriers to D/C:            Co-evaluation              AM-PAC OT "6 Clicks" Daily Activity     Outcome Measure Help from another person eating meals?: None Help from another person taking care of personal grooming?: A Little Help from another person toileting, which includes using toliet, bedpan, or urinal?: A Lot Help from another person bathing (including washing, rinsing, drying)?: A Lot Help from another person to put on and taking off regular upper body clothing?: A Little Help from another person to put on and taking off regular lower body clothing?: A Lot 6 Click Score: 16   End of Session Equipment Utilized During Treatment: Gait belt Nurse Communication: Mobility status;Other (comment) (o2)  Activity Tolerance: Other (comment) (limited by pulmonary system) Patient left: in chair;with call bell/phone within reach;with chair alarm set  OT Visit Diagnosis: Unsteadiness on feet (R26.81);Other abnormalities of gait and mobility (R26.89);Repeated falls (R29.6);Muscle weakness (generalized) (M62.81)                Time: 5277-8242 OT Time Calculation (min): 36 min Charges:  OT General Charges $OT Visit: 1 Visit OT Evaluation $OT Eval Low Complexity: 1 Low OT Treatments $Self Care/Home Management : 8-22 mins  Joeseph Amor OTR/L  Acute Rehab Services  [phone] office number [phone] pager number   Joeseph Amor 11/25/2020, 8:29 AM

## 2020-11-25 NOTE — TOC Initial Note (Signed)
Transition of Care [facility]) - Initial/Assessment Note    Patient Details  Name: [name] MRN: [id number] Date of Birth: 11/03/1940  [facility] CM/SW Contact:    Erenest Rasher, RN Phone Number:  [phone] 11/25/2020, 4:51 PM  Clinical Narrative:                 TOC CM spoke to pt at bedside. Gave permission to speak to dtr, Arbie Cookey. States she lives in home with daughter. Has RW, wheelchair, bsc and oyxgen with [place] in the home. States she has never received HH or SNF. Gave permission to speak to Dtr, Arbie Cookey. Contacted dtr and states she is agreeable to [facility]. States pt was able to ambulate with walker but not for long distances due to shortness of breath. Waiting PT recommendations.   Expected Discharge Plan: [place] Barriers to Discharge: Continued Medical Work up   Patient Goals and CMS Choice Patient states their goals for this hospitalization and ongoing recovery are:: want to be able to get around with not getting so short of breath CMS Medicare.gov Compare Post Acute Care list provided to:: Patient Represenative (must comment) (daughter-Carol) Choice offered to / list presented to : Adult Children  Expected Discharge Plan and Services Expected Discharge Plan: [place] In-house Referral: Clinical Social Work Discharge Planning Services: CM Consult Post Acute Care Choice: [place] arrangements for the past 2 months: [place]                                      Prior Living Arrangements/Services Living arrangements for the past 2 months: Single Family Home Lives with:: Adult Children Patient language and need for interpreter reviewed:: Yes        Need for Family Participation in Patient Care: Yes (Comment) Care giver support system in place?: Yes (comment) Current home services: DME (wheelchair, rolling walker, oxygen -[employer]) Criminal Activity/Legal Involvement Pertinent  to Current Situation/Hospitalization: No - Comment as needed  Activities of Daily Living Home Assistive Devices/Equipment: None ADL Screening (condition at time of admission) Patient's cognitive ability adequate to safely complete daily activities?: Yes Is the patient deaf or have difficulty hearing?: Yes Does the patient have difficulty seeing, even when wearing glasses/contacts?: Yes Does the patient have difficulty concentrating, remembering, or making decisions?: Yes Patient able to express need for assistance with ADLs?: No Does the patient have difficulty dressing or bathing?: Yes Independently performs ADLs?: Yes (appropriate for developmental age) Does the patient have difficulty walking or climbing stairs?: No Weakness of Legs: None Weakness of Arms/Hands: None  Permission Sought/Granted Permission sought to share information with : Case Manager, PCP, Family Supports Permission granted to share information with : Yes, Verbal Permission Granted  Share Information with NAME: Gray Bernhardt  Permission granted to share info w AGENCY: Home Health, SNF  Permission granted to share info w Relationship: daughter  Permission granted to share info w Contact Information: [phone]  Emotional Assessment Appearance:: Appears stated age Attitude/Demeanor/Rapport: Gracious, Engaged Affect (typically observed): Accepting Orientation: : Oriented to Self, Oriented to Place, Oriented to  Time, Oriented to Situation Alcohol / Substance Use: Not Applicable Psych Involvement: No (comment)  Admission diagnosis:  Hypoxia [R09.02] Acute on chronic respiratory failure with hypoxia ([place]) [J96.21] Community acquired pneumonia of right upper lobe of lung [J18.9] Patient Active Problem List  Diagnosis Date Noted   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   COPD with acute exacerbation ([place]) 08/27/2020   COVID-19 05/01/2020   Pulmonary hypertension ([place]) 01/15/2020   Hypoxemic respiratory failure,  chronic ([place]) 01/15/2020   Lung nodule, multiple 01/15/2020   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   SOB (shortness of breath) 12/26/2019   Acute on chronic respiratory failure with hypoxia ([place]) 12/25/2019   Acute respiratory failure with hypoxemia ([place]) 12/25/2019   Essential hypertension 06/06/2017   Hyperlipidemia 06/06/2017   Leg cramps 06/06/2017   Vertigo 06/06/2017   Macular degeneration 06/06/2017   Hypothyroidism 06/06/2017   Carotid stenosis 01/22/2014   Aftercare following surgery of the circulatory system, NEC 01/02/2013   Occlusion and stenosis of carotid artery without mention of cerebral infarction 04/27/2011   PCP:  [name], MD Pharmacy:   [id number] [id number]: [phone]: [phone] [facility] [place]Phone: [phone]: [phone]    Social Determinants of Health (SDOH) Interventions    Readmission Risk Interventions No flowsheet data found.

## 2020-11-25 NOTE — Plan of Care (Signed)
  Problem: Safety: Goal: Ability to remain free from injury will improve Outcome: Progressing   Problem: Activity: Goal: Risk for activity intolerance will decrease Outcome: Not Progressing

## 2020-11-25 NOTE — Consult Note (Signed)
   [facility] CM Inpatient Consult   11/25/2020  [name] [birth year] [id number]  [facility] [ACO] Patient: Medicare CMS DCE  Primary Care Provider:  [name], MD [facility]  Patient screened for extreme high risk score for unplanned readmission risk and  to assess for potential [place] Management service needs for post hospital transition.  Review of patient's medical record reveals patient is followed by Advanced HF team.  Plan:  Continue to follow progress and disposition to assess for post hospital care management needs.    For questions contact:   Natividad Brood, RN BSN [facility] Liaison  [phone] business mobile phone Toll free office [phone]  Fax number: [phone] [email or web address]

## 2020-11-25 NOTE — Progress Notes (Signed)
Initial Nutrition Assessment  DOCUMENTATION CODES:   Not applicable  INTERVENTION:  -Ensure Enlive po BID, each supplement provides 350 kcal and 20 grams of protein -MVI with minerals daily  NUTRITION DIAGNOSIS:   Increased nutrient needs related to acute illness (CAP) as evidenced by estimated needs.  GOAL:   Patient will meet greater than or equal to 90% of their needs  MONITOR:   PO intake, Supplement acceptance, Labs, Weight trends, I & O's  REASON FOR ASSESSMENT:   Consult Other (Comment) ("nutritional goals")  ASSESSMENT:   Pt with known chronic hypoxic pulmonary hypertension requiring 8-10L [place] daily presented with increasing shortness of breath, admitted with mild centrilobular emphysema and CAP. PMH includes COPD, GERD, HTN, hypothyroidism, arthritis. Pt noted to be declining in health and becoming wheelchair-bound within the last month.  Pt denies changes to appetite/weight PTA. Reviewed weight history, but no significant weight changes noted. However, appears admission weight was estimated/verbally obtained rather than measured; recommend obtaining new measured weight so weight history can be properly assessed.    No PO intake documented.   Medications: colace, lasix, protonix, florastor, IV abx Labs: K+ 3.4 (L)  UOP: 456m x24 hours I/O: -9018msince admit  NUTRITION - FOCUSED PHYSICAL EXAM: Unable to perform at this time. Will attempt at follow-up.   Diet Order:   Diet Order             Diet Heart Room service appropriate? Yes; Fluid consistency: Thin  Diet effective now                   EDUCATION NEEDS:   No education needs have been identified at this time  Skin:  Skin Assessment: Reviewed RN Assessment  Last BM:  PTA  Height:   Ht Readings from Last 1 Encounters:  11/23/20 _0  (1.6 m)    Weight:   Wt Readings from Last 10 Encounters:  11/23/20 56.2 kg  11/14/20 56.6 kg  11/03/20 56.6 kg  09/25/20 56.7 kg  09/01/20 59.6 kg   08/15/20 58.8 kg  08/04/20 59 kg  03/19/20 59 kg  03/17/20 55.8 kg  02/05/20 56.1 kg    BMI:  Body mass index is 21.97 kg/m.  Estimated Nutritional Needs:   Kcal:  1400-1600  Protein:  70-80 grams  Fluid:  >1.4L/d    AmLarkin InaMS, RD, LDN (she/her/hers) RD pager number and weekend/on-call pager number located in [place]

## 2020-11-25 NOTE — Progress Notes (Signed)
Physical Therapy Evaluation Patient Details Name: [name] MRN: [id number] DOB: January 16, 1941 Today's Date: 11/25/2020   History of Present Illness  Pt is a 80 yr old female who preented to ED as increase in SOB. Pt reports pt is normally on 8L at rest and 10L with movment in the home but noted in the last week they started to have changes in o2 readings into the 80s. PMH: CAD, COPD, glacoma, pulmonary HTN  Clinical Impression  Pt admitted with above diagnosis. Pt did not tolerate much as she was in bed on arrival with NRB mask and 15LHFNC.  Desaturates with little activity. Will follow acutely and progress pt as able.  Pt currently with functional limitations due to the deficits listed below (see PT Problem List). Pt will benefit from skilled PT to increase their independence and safety with mobility to allow discharge to the venue listed below.       Follow Up Recommendations Home health PT;Supervision/Assistance - 24 hour    Equipment Recommendations  None recommended by PT    Recommendations for Other Services       Precautions / Restrictions Precautions Precautions: Fall;Other (comment) (watch o2) Restrictions Weight Bearing Restrictions: No      Mobility  Bed Mobility Overal bed mobility: Needs Assistance Bed Mobility: Rolling;Supine to Sit Rolling: Supervision   Supine to sit: Supervision     General bed mobility comments: pt cued on positioning due to breathing techniques    Transfers Overall transfer level: Needs assistance Equipment used: 1 person hand held assist Transfers: Sit to/from Stand Sit to Stand: Min guard         General transfer comment: pt requires increase in time for energy conservation techniques.  Pt was able to stand and take a few steps to [facility] with min assist but desaturation quickly to 76% with pt on NRB and 15LHFNC therefore laid pt back down and pt recovered to 91% after a few min of rest in bed.  Ambulation/Gait                 Stairs            Wheelchair Mobility    Modified Rankin (Stroke Patients Only)       Balance Overall balance assessment: Needs assistance Sitting-balance support: No upper extremity supported;Feet supported Sitting balance-Leahy Scale: Fair     Standing balance support: Bilateral upper extremity supported;During functional activity Standing balance-Leahy Scale: Poor Standing balance comment: relies on light UE support to stand                             Pertinent Vitals/Pain      Home Living Family/patient expects to be discharged to:: Private residence Living Arrangements: Other relatives Available Help at Discharge: Available 24 hours/day;Family Type of Home: House Home Access: Level entry     Home Layout: One level   Additional Comments: Pt reports that family will be there all day and completes all meal preperation and will walk with pt to bathroom and daughter assists with bathing/dressing    Prior Function Level of Independence: Needs assistance   Gait / Transfers Assistance Needed: walks without AD  ADL's / Homemaking Assistance Needed: daughter assists with showers, meals and transportation  Comments: pt was using transport chair for any out of the house mobility, family present with mobility but no AE/DME used     Hand Dominance   Dominant Hand: Right  Extremity/Trunk Assessment   Upper Extremity Assessment Upper Extremity Assessment: Defer to OT evaluation    Lower Extremity Assessment Lower Extremity Assessment: Generalized weakness    Cervical / Trunk Assessment Cervical / Trunk Assessment: Kyphotic  Communication   Communication: No difficulties  Cognition Arousal/Alertness: Awake/alert Behavior During Therapy: WFL for tasks assessed/performed Overall Cognitive Status: Within Functional Limits for tasks assessed                                        General Comments General comments (skin  integrity, edema, etc.): 84 bpm, 95% NRB mask and 15 LHFNC, 27.  Desat to 74% at EOB after standing to get to [facility]. Took 5 min to recover to 93%.    Exercises General Exercises - Lower Extremity Ankle Circles/Pumps: AROM;Both;10 reps;Seated Long Arc Quad: AROM;Both;10 reps;Seated Hip Flexion/Marching: AROM;Both;10 reps;Seated   Assessment/Plan    PT Assessment Patient needs continued PT services  PT Problem List Decreased activity tolerance;Decreased balance;Decreased mobility;Decreased knowledge of use of DME;Decreased safety awareness;Decreased knowledge of precautions;Cardiopulmonary status limiting activity       PT Treatment Interventions DME instruction;Gait training;Functional mobility training;Therapeutic activities;Therapeutic exercise;Balance training;Patient/family education    PT Goals (Current goals can be found in the Care Plan section)  Acute Rehab PT Goals Patient Stated Goal: to breath better PT Goal Formulation: With patient Time For Goal Achievement: 12/09/20 Potential to Achieve Goals: Good    Frequency Min 3X/week   Barriers to discharge        Co-evaluation               AM-PAC PT "6 Clicks" Mobility  Outcome Measure Help needed turning from your back to your side while in a flat bed without using bedrails?: A Little Help needed moving from lying on your back to sitting on the side of a flat bed without using bedrails?: A Little Help needed moving to and from a bed to a chair (including a wheelchair)?: A Little Help needed standing up from a chair using your arms (e.g., wheelchair or bedside chair)?: A Little Help needed to walk in hospital room?: A Little Help needed climbing 3-5 steps with a railing? : A Little 6 Click Score: 18    End of Session Equipment Utilized During Treatment: Gait belt;Oxygen Activity Tolerance: Patient limited by fatigue Patient left: with call bell/phone within reach;in bed;with bed alarm set;with family/visitor  present Nurse Communication: Mobility status PT Visit Diagnosis: Muscle weakness (generalized) (M62.81);Unsteadiness on feet (R26.81)    Time: 4103-0131 PT Time Calculation (min) (ACUTE ONLY): 11 min   Charges:   PT Evaluation $PT Eval Moderate Complexity: 1 Mod          Fusako Tanabe M,PT Acute Rehab Services [phone] (pager)   Alvira Philips 11/25/2020, 12:58 PM

## 2020-11-26 DIAGNOSIS — J9621 Acute and chronic respiratory failure with hypoxia: Secondary | ICD-10-CM | POA: Diagnosis not present

## 2020-11-26 LAB — BASIC METABOLIC PANEL
Anion gap: 8 (ref 5–15)
BUN: 14 mg/dL (ref 8–23)
CO2: 24 mmol/L (ref 22–32)
Calcium: 9 mg/dL (ref 8.9–10.3)
Chloride: 106 mmol/L (ref 98–111)
Creatinine, Ser: 0.79 mg/dL (ref 0.44–1.00)
GFR, Estimated: >60 mL/min (ref >60)
Glucose, Bld: 97 mg/dL (ref 70–99)
Potassium: 3.7 mmol/L (ref 3.5–5.1)
Sodium: 138 mmol/L (ref 135–145)

## 2020-11-26 LAB — CBC
HCT: 34.2 % — ABNORMAL LOW (ref 36.0–46.0)
Hemoglobin: 11 g/dL — ABNORMAL LOW (ref 12.0–15.0)
MCH: 29.9 pg (ref 26.0–34.0)
MCHC: 32.2 g/dL (ref 30.0–36.0)
MCV: 92.9 fL (ref 80.0–100.0)
Platelets: 279 10*3/uL (ref 150–400)
RBC: 3.68 MIL/uL — ABNORMAL LOW (ref 3.87–5.11)
RDW: 14.4 % (ref 11.5–15.5)
WBC: 13 10*3/uL — ABNORMAL HIGH (ref 4.0–10.5)
nRBC: 0 % (ref 0.0–0.2)

## 2020-11-26 LAB — C-REACTIVE PROTEIN: CRP: 5.5 mg/dL — ABNORMAL HIGH (ref <1.0)

## 2020-11-26 LAB — LEGIONELLA PNEUMOPHILA SEROGP 1 UR AG: L. pneumophila Serogp 1 Ur Ag: NEGATIVE

## 2020-11-26 LAB — SEDIMENTATION RATE: Sed Rate: 130 mm/hr — ABNORMAL HIGH (ref 0–22)

## 2020-11-26 LAB — PROCALCITONIN: Procalcitonin: 0.27 ng/mL

## 2020-11-26 LAB — MAGNESIUM: Magnesium: 2.2 mg/dL (ref 1.7–2.4)

## 2020-11-26 MED ORDER — POTASSIUM CHLORIDE CRYS ER 20 MEQ PO TBCR
20.0000 meq | EXTENDED_RELEASE_TABLET | Freq: Every day | ORAL | Status: DC
  Administered 2020-11-26 – 2020-11-29 (×4): 20 meq via ORAL
  Filled 2020-11-26 (×4): qty 1

## 2020-11-26 NOTE — Care Management Important Message (Signed)
Important Message  Patient Details  Name: [name] MRN: [id number] Date of Birth: 02-23-1941   Medicare Important Message Given:  Yes     Arrie Borrelli Montine Circle 11/26/2020, 2:50 PM

## 2020-11-26 NOTE — Progress Notes (Signed)
[facility] Progress Note  Patient: [name]    HYI:[id number]  DOA: 11/22/2020     Date of Service: 11/26/2020  Brief hospital course: Patient is 80 years old female with past medical history of pulmonary hypertension, COPD, chronic respiratory failure on 8 L of oxygen at baseline, hypothyroidism, generalized weakness presented to the hospital with shortness of breath.  She was noted to have right lower lobe pneumonia and is currently getting antibiotics.  Patient has had increased oxygen demand starting 11/25/2020 and is currently on 15 L of nonrebreather mask.    Assessment and Plan:  Acute on chronic hypoxic respiratory failure likely secondary to right upper lobe pneumonia, pulmonary hypertension. Patient presented with progressive cough worsening hypoxia.  At home patient is on 8 L of oxygen by nasal cannula but currently on 15 L of nonrebreather mask.  Patient desaturates to 80% with minimal exertion.  Currently on heated high flow oxygen.  CTA of the chest shows no evidence of pulmonary embolism.  CT scan shows right upper lobe pneumonia.  Patient has been started on IV antibiotics.  Continue diuresis with IV furosemide.  Continue azithromycin and Rocephin for pneumonia coverage.  Procalcitonin elevated.  Urinary strep is negative.    Pulmonary hypertension  per cardiology.  Cardiology on board.  Currently on 40 mg of Lasix daily.  Palliative care has been consulted.  Macitentan stopped due to intolerance.  Hypothyroidism Continue Synthroid.  Essential hypertension Continue Toprol, Norvasc and lisinopril.  PVD, subclavian occlusion Continue aspirin, Plavix, and Zocor  Goals of care conversation Palliative care is involved.  Patient was not a good candidate for intubation.  Scheduled Meds:  arformoterol  15 mcg Nebulization BID   aspirin  81 mg Oral Daily   budesonide  0.5 mg Nebulization BID   clopidogrel  75 mg Oral Daily   docusate sodium  100 mg Oral BID    enoxaparin (LOVENOX) injection  40 mg Subcutaneous Daily   feeding supplement  237 mL Oral BID BM   furosemide  40 mg Oral Daily   gabapentin  100 mg Oral QHS   ipratropium  2 spray Each Nare TID   latanoprost  1 drop Both Eyes QHS   levothyroxine  75 mcg Oral Daily   metoprolol succinate  12.5 mg Oral Daily   multivitamin with minerals  1 tablet Oral Daily   pantoprazole  40 mg Oral Daily   potassium chloride  20 mEq Oral Daily   saccharomyces boulardii  250 mg Oral Daily   simvastatin  20 mg Oral q1800   sodium chloride flush  3 mL Intravenous Q12H   Continuous Infusions:  azithromycin 500 mg (11/26/20 0812)   cefTRIAXone (ROCEPHIN)  IV 2 g (11/25/20 1728)   PRN Meds: acetaminophen **OR** acetaminophen, albuterol, bisacodyl, diclofenac Sodium, guaiFENesin-dextromethorphan, hydrALAZINE, loratadine, morphine injection, ondansetron **OR** ondansetron (ZOFRAN) IV, oxyCODONE, polyethylene glycol, sodium chloride   DVT Prophylaxis:   enoxaparin (LOVENOX) injection 40 mg Start: 11/23/20 1000    CODE STATUS.   Full code.  Family Communication:  Spoke with the patient's daughter at bedside  Data Reviewed: I have reviewed labs and imaging studies.  Subjective: Continues to have shortness of breath.  No nausea no vomiting.  Feeling better.  No cough.  Was on 15 LPM [place] and 15 L NRB and still was getting hypoxic with minimal exertion.  Objective  Physical Exam:  Vitals:   11/26/20 0310 11/26/20 0456 11/26/20 0754 11/26/20 1139  BP:  (!) 93/45  Pulse: 74 75 86 99  Resp:  20 (!) 24 (!) 24  Temp:      TempSrc:      SpO2: 91% 92% 94% 91%  Weight:      Height:       General:  Average built, not in obvious distress HENT:   No scleral pallor or icterus noted. Oral mucosa is moist.  Chest:  Clear breath sounds.  Diminished breath sounds bilaterally. No crackles or wheezes.  CVS: S1 &S2 heard. No murmur.  Regular rate and rhythm. Abdomen: Soft, nontender, nondistended.  Bowel  sounds are heard.   Extremities: No cyanosis, clubbing with trace edema. peripheral pulses are palpable. Psych: Alert, awake and oriented, normal mood CNS:  No cranial nerve deficits.  Power equal in all extremities.   Skin: Warm and dry.  No rashes noted.  Disposition:  Status is: Inpatient  Remains inpatient appropriate because:Hemodynamically unstable, severe pulmonary hypertension, acute on chronic hypoxic respiratory failure,  Dispo: The patient is from: Home              Anticipated d/c is to: Home health              Patient currently is not medically stable to d/c.   Difficult to place patient No  Flora Lipps, MD [facility] 11/26/2020 2:03 PM

## 2020-11-26 NOTE — Progress Notes (Addendum)
NAME:  [name], MRN:  [id number], DOB:  18-Jan-1941, LOS: 3 ADMISSION DATE:  11/22/2020, CONSULTATION DATE: 11/23/2020 REFERRING MD: Hospitalist, CHIEF COMPLAINT: Hypoxia  History of Present Illness:  80 year old female with known chronic hypoxia pulmonary hypertension requiring 8 to 10 L nasal cannula daily basis.  Of note she had COVID January 2022 was asymptomatic.  Is been seen by Dr. Beckie Salts 11/14/2020 with a similar episode of shortness of breath.  She has been ruled out for sleep apnea.  He is unable to do pulmonary function test.  She is noted to be declining in health and becoming wheelchair-bound within the last month. She reports increasing shortness of breath x2 days positive for fever and chills without sputum production or cough.  She notes her O2 sats have decreased into the 70s and she presented to the hospital for further evaluation and treatment.  Seen by the hospitalist service started on antimicrobial therapy increasing oxygen to meet sats requirements of 90%.  She is awake alert no acute distress able to [facility] and follow commands.  We will agree with steroids antibiotics increasing oxygen and diuresis.  She has been pancultured and she will be admitted to the stepdown unit to be monitored appropriately.  Pertinent  Medical History   Past Medical History:  Diagnosis Date   Arthritis    Carotid artery occlusion    Chronic respiratory failure with hypoxia, on home O2 therapy (HCC)    COPD (chronic obstructive pulmonary disease) (HCC)    GERD (gastroesophageal reflux disease)    Glaucoma    Hypertension    Hypothyroidism (acquired)    Pulmonary hypertension ([place])    Vasomotor rhinitis      Significant Hospital Events: Including procedures, antibiotic start and stop dates in addition to other pertinent events     Interim History / Subjective:   No acute complaints.   Objective   Blood pressure (!) 93/45, pulse 99, temperature 98.1 F (36.7 C), temperature  source Oral, resp. rate (!) 24, height _0  (1.6 m), weight 56.2 kg, SpO2 91 %.    FiO2 (%):  [70 %-100 %] 70 %   Intake/Output Summary (Last 24 hours) at 11/26/2020 1148 Last data filed at 11/25/2020 2202 Gross per 24 hour  Intake 250 ml  Output 500 ml  Net -250 ml   Filed Weights   11/23/20 4580  Weight: 56.2 kg    Examination: General: Elderly female no acute distress HENT: moist mucous membranes, sclera anicteric Lungs: decreased breath sounds bilaterally. No wheezing or rhonchi. 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 - Concern for PVOD, so all pulmonary vasodilators are being held. - Continue azithromycin and ceftriaxone for CAP coverage. Procal elevated. Urine strep negative. Recommend completing 5 day course for each antibiotic. Stop dates have been entered for antibiotics - Continue nebulizer treatments. - Agree with palliative care consult. I have recommended that she change her code status to DNR/DNI. Hospice care may be the best route.  PCCM will sign off  as no further recommendations to her care plan. Please call if there are further questions.   Best Practice (right click and "Reselect all SmartList Selections" daily)   Diet/type: Regular consistency (see orders) DVT prophylaxis: other GI prophylaxis: PPI Lines: N/A Foley:  N/A Code Status:  full code Last date of multidisciplinary goals of care discussion [per  primary]  Labs   CBC: Recent Labs  Lab 11/22/20 2300 11/24/20 0503 11/26/20 0411  WBC 15.5* 13.6* 13.0*  NEUTROABS 12.6*  --   --   HGB 11.5* 10.6* 11.0*  HCT 37.7 34.2* 34.2*  MCV 97.4 94.7 92.9  PLT 255 265 664    Basic Metabolic Panel: Recent Labs  Lab 11/22/20 2300 11/24/20 0503 11/26/20 0411  NA 141 140 138  K 4.1 3.4* 3.7  CL 109 107 106  CO2 _0 GLUCOSE 139* 93 97  BUN _1 CREATININE 0.75 0.81 0.79  CALCIUM 9.3 9.3 9.0  MG  --   --  2.2   GFR: Estimated Creatinine Clearance: 44.8 mL/min (by C-G formula based on SCr of 0.79 mg/dL). Recent Labs  Lab 11/22/20 2300 11/24/20 0503 11/25/20 0258 11/26/20 0411  PROCALCITON  --  0.70 0.52 0.27  WBC 15.5* 13.6*  --  13.0*    Liver Function Tests: Recent Labs  Lab 11/23/20 1300  AST 34  ALT 35  ALKPHOS 58  BILITOT 1.2  PROT 6.5  ALBUMIN 2.7*   No results for input(s): LIPASE, AMYLASE in the last 168 hours. No results for input(s): AMMONIA in the last 168 hours.  ABG    Component Value Date/Time   PHART 7.409 12/25/2019 2218   PCO2ART 30.1 (L) 12/25/2019 2218   PO2ART 73 (L) 12/25/2019 2218   HCO3 22.7 09/01/2020 0831   HCO3 22.9 09/01/2020 0831   TCO2 24 09/01/2020 0831   TCO2 24 09/01/2020 0831   ACIDBASEDEF 2.0 09/01/2020 0831   ACIDBASEDEF 2.0 09/01/2020 0831   O2SAT 73.0 09/01/2020 0831   O2SAT 75.0 09/01/2020 0831     Coagulation Profile: No results for input(s): INR, PROTIME in the last 168 hours.  Cardiac Enzymes: No results for input(s): CKTOTAL, CKMB, CKMBINDEX, TROPONINI in the last 168 hours.  HbA1C: Hgb A1c MFr Bld  Date/Time Value Ref Range Status  12/26/2019 07:09 AM 5.7 (H) 4.8 - 5.6 % Final    Comment:    (NOTE) Pre diabetes:          5.7%-6.4%  Diabetes:              >6.4%  Glycemic control for   <7.0% adults with diabetes   04/17/2008 10:40 PM (H) 4.6 - 6.1 % Final   6.2 (NOTE)   The ADA recommends the following therapeutic goal for glycemic    control related to Hgb A1C measurement:   Goal of Therapy:   < 7.0% Hgb A1C   Reference: American Diabetes Association: Clinical Practice   Recommendations 2008, Diabetes Care,  2008, 31:(Suppl 1).    CBG: No results for input(s): GLUCAP in the last 168 hours.   Critical care time:     Freda Jackson, MD [facility] Office: [phone]   See Amion for personal pager PCCM on call pager [phone] until 7pm. Please call Elink 7p-7a. [phone]

## 2020-11-26 NOTE — TOC CM/SW Note (Signed)
HF TOC CM spoke to pt and dtr, Arbie Cookey at bedside. Offered choice for [facility], dtr states she is will review list. [place] offers both [facility] and Hospice. Pt has declined Home Hospice at this time. [place] to follow up on max liter flow of oxygen at home. Pt does not have RW at home. HF TOC CM will continue to follow for dc needs. [place], Heart Failure TOC CM [phone]

## 2020-11-26 NOTE — Consult Note (Signed)
Consultation Note Date: 11/26/2020   Patient Name: [name]  DOB: 12-19-1940  MRN: [id number]  Age / Sex: 80 y.o., female  PCP: [name], MD Referring Physician: Flora Lipps, MD  Reason for Consultation:   HPI/Patient Profile: 80 y.o. female  with past medical history of COPD, PAH, hypothyroidism, hypertension admitted on 11/22/2020 with acute on chronic respiratory failure secondary to possible pnuemonia in the setting of advanced PAH and COPD. Currently on 20L HFNC. No longer candidate for advanced medications for PAH. Diuresing well on lasix. Completing antibiotic course on 8/19. Palliative medicine consulted for goals of care discussion.   Primary Decision Maker PATIENT  Discussion: I have reviewed medical records including EPIC notes, labs and imaging, received report from RN, assessed the patient and then met at the bedside along with [name] and her daughter [name] to discuss diagnosis prognosis, GOC, EOL wishes, disposition and options.  I introduced Palliative Medicine as specialized medical care for people living with serious illness. It focuses on providing relief from the symptoms and stress of a serious illness. The goal is to improve quality of life for both the patient and the family.  We discussed a brief life review of the patient and then focused on their current illness. The natural disease trajectory and expectations at EOL were discussed.  [name] is the mother of three children. She enjoys cooking and spending time with her family. She lives at home with her daughter, [name]. Prior to admission she was ambulatory and independent for ADL's.  [name] shares that she "feels good". She denies any complaints. She blames her hospitalization on her oxygen concentrator at home and tells me she thinks it needs to be "checked out".   Ms. [name] shares that she does not feel  there is anything wrong with her. However, her daughter, [name] shared that she understands that her Mom is at the end stage of her illness and that her time of life is likely limited.  [name] is able to note that when she dies she wishes to be at home surrounded by her family. She is not afraid of dying, but she does wish to continue to prolong her time as long as possible.   The difference between aggressive medical intervention and comfort care was considered in light of the patient's goals of care. Specifically, we discussed her advanced lung disease and maximized medical treatment.  We discussed code status and artificial ventilation.   [name] easily states that she would not want her mother to undergo resuscitation. We also discussed what the goals of resuscitation would be, noting that if [name]'s goal was to die at home how resuscitation would undermine that goal. However, [name] states she would like to do more "research" and have discussions with her family about it.   Hospice and Palliative Care services outpatient were explained and offered. [name] is eligible for Hospice services. However, she and her daughter decline to have Hospice. They state that they do not want any strangers in their home. [name] states that  she and her sister would provide the necessary care for [name].   Discussed the importance of continued conversation with family and the medical providers regarding overall plan of care and treatment options, ensuring decisions are within the context of the patient's values and GOCs.    Questions and concerns were addressed.  Hard Choices booklet left for review. The family was encouraged to call with questions or concerns.    SUMMARY OF RECOMMENDATIONS -Patient's daughter appears to have clarity on patient's situation, however, I'm unsure about patient understanding -DNR and Hospice was discussed and how this relates to her goal of dying at home- PMT will followup on  this discussion tomorrow- for now continue full scope and full code -She is clear that she would not want to go to a SNF    Code Status/Advance Care Planning: Full code   Prognosis:   Unable to determine  Discharge Planning: To Be Determined  Primary Diagnoses: Present on Admission:  Acute on chronic respiratory failure with hypoxia ([place])  Carotid stenosis  Essential hypertension  Hypothyroidism  Pulmonary hypertension (HCC)  Vasomotor rhinitis   Review of Systems  Constitutional:  Positive for activity change. Negative for appetite change.  Respiratory:  Negative for shortness of breath.   Musculoskeletal:  Positive for arthralgias.  Psychiatric/Behavioral:  Negative for sleep disturbance.    Physical Exam Vitals and nursing note reviewed.  Cardiovascular:     Rate and Rhythm: Normal rate and regular rhythm.  Pulmonary:     Effort: Pulmonary effort is normal.     Comments: On high flow oxygen Neurological:     General: No focal deficit present.     Mental Status: She is alert.  Psychiatric:        Mood and Affect: Mood normal.     Comments: Impaired insight    Vital Signs: BP (!) 93/45 (BP Location: Right Arm)   Pulse 99   Temp 98.1 F (36.7 C) (Oral)   Resp (!) 24   Ht _0  (1.6 m)   Wt 56.2 kg   SpO2 91%   BMI 21.97 kg/m  Pain Scale: 0-10   Pain Score: 0-No pain   SpO2: SpO2: 91 % O2 Device:SpO2: 91 % O2 Flow Rate: .O2 Flow Rate (L/min): 20 L/min  IO: Intake/output summary:  Intake/Output Summary (Last 24 hours) at 11/26/2020 1238 Last data filed at 11/25/2020 2202 Gross per 24 hour  Intake 250 ml  Output 500 ml  Net -250 ml    LBM:   Baseline Weight: Weight: 56.2 kg Most recent weight: Weight: 56.2 kg     Palliative Assessment/Data: PPS: 40%    Thank you for this consult. Palliative medicine will continue to follow and assist as needed.   Time In: 1132 Time Out: 1255  Time Total: 73 mins Greater than 50%  of this time was spent  counseling and coordinating care related to the above assessment and plan.  Signed by: Mariana Kaufman, AGNP-C Palliative Medicine    Please contact Palliative Medicine Team phone at [phone] for questions and concerns.  For individual provider: See Shea Evans

## 2020-11-26 NOTE — Progress Notes (Addendum)
Advanced Heart Failure Rounding Note  PCP-Cardiologist: Dr. Aundra Dubin  Subjective:    On 40 mg furosemide daily. Is/Os not accurate. No daily weights.  Macitentan stopped due to intolerance.   Increased 02 requirements, on 20 L/min HFNC (on 8-9 L [place] at home)  Palliative Care consulted.   Reports feeling well today.  BP soft  Objective:   Weight Range: 56.2 kg Body mass index is 21.97 kg/m.   Vital Signs:   Temp:  [98.1 F (36.7 C)] 98.1 F (36.7 C) (08/16 2018) Pulse Rate:  [74-91] 86 (08/17 0754) Resp:  [18-24] 24 (08/17 0754) BP: (92-105)/(44-61) 93/45 (08/17 0456) SpO2:  [89 %-100 %] 94 % (08/17 0754) FiO2 (%):  [70 %-100 %] 70 % (08/17 0754)    Weight change: Filed Weights   11/23/20 0632  Weight: 56.2 kg    Intake/Output:   Intake/Output Summary (Last 24 hours) at 11/26/2020 0829 Last data filed at 11/25/2020 2202 Gross per 24 hour  Intake 250 ml  Output 500 ml  Net -250 ml      Physical Exam    General:  Lying comfortably in bed. No resp difficulty on 20 L HFNC HEENT: Normal Neck: Supple. JVP 7  Carotids 2+ bilat; no bruits. No lymphadenopathy or thyromegaly appreciated. Cor: PMI nondisplaced. Regular rate & rhythm. No rubs, gallops or murmurs. Lungs: Diminished  Abdomen: Soft, nontender, nondistended. No hepatosplenomegaly. No bruits or masses. Good bowel sounds. Extremities: No cyanosis, clubbing, rash, edema Neuro: Alert & orientedx3, cranial nerves grossly intact. moves all 4 extremities w/o difficulty. Affect pleasant   Telemetry   SR 70-80s personally reviewed.   EKG    N/A  Labs    CBC Recent Labs    11/24/20 0503 11/26/20 0411  WBC 13.6* 13.0*  HGB 10.6* 11.0*  HCT 34.2* 34.2*  MCV 94.7 92.9  PLT 265 816   Basic Metabolic Panel Recent Labs    11/24/20 0503 11/26/20 0411  NA 140 138  K 3.4* 3.7  CL 107 106  CO2 25 24  GLUCOSE 93 97  BUN 17 14  CREATININE 0.81 0.79  CALCIUM 9.3 9.0  MG  --  2.2   Liver  Function Tests Recent Labs    11/23/20 1300  AST 34  ALT 35  ALKPHOS 58  BILITOT 1.2  PROT 6.5  ALBUMIN 2.7*   No results for input(s): LIPASE, AMYLASE in the last 72 hours. Cardiac Enzymes No results for input(s): CKTOTAL, CKMB, CKMBINDEX, TROPONINI in the last 72 hours.  BNP: BNP (last 3 results) Recent Labs    09/25/20 1459 11/03/20 1054 11/22/20 2300  BNP 77.0 102.4* 105.7*    ProBNP (last 3 results) No results for input(s): PROBNP in the last 8760 hours.   D-Dimer No results for input(s): DDIMER in the last 72 hours.  Hemoglobin A1C No results for input(s): HGBA1C in the last 72 hours. Fasting Lipid Panel No results for input(s): CHOL, HDL, LDLCALC, TRIG, CHOLHDL, LDLDIRECT in the last 72 hours. Thyroid Function Tests No results for input(s): TSH, T4TOTAL, T3FREE, THYROIDAB in the last 72 hours.  Invalid input(s): FREET3  Other results:   Imaging    No results found.   Medications:     Scheduled Medications:  amLODipine  5 mg Oral Daily   arformoterol  15 mcg Nebulization BID   aspirin  81 mg Oral Daily   budesonide  0.5 mg Nebulization BID   clopidogrel  75 mg Oral Daily   docusate sodium  100 mg Oral BID   enoxaparin (LOVENOX) injection  40 mg Subcutaneous Daily   feeding supplement  237 mL Oral BID BM   furosemide  40 mg Oral Daily   gabapentin  100 mg Oral QHS   ipratropium  2 spray Each Nare TID   latanoprost  1 drop Both Eyes QHS   levothyroxine  75 mcg Oral Daily   metoprolol succinate  12.5 mg Oral Daily   multivitamin with minerals  1 tablet Oral Daily   pantoprazole  40 mg Oral Daily   saccharomyces boulardii  250 mg Oral Daily   simvastatin  20 mg Oral q1800   sodium chloride flush  3 mL Intravenous Q12H    Infusions:  azithromycin 500 mg (11/26/20 0812)   cefTRIAXone (ROCEPHIN)  IV 2 g (11/25/20 1728)    PRN Medications: acetaminophen **OR** acetaminophen, albuterol, bisacodyl, diclofenac Sodium,  guaiFENesin-dextromethorphan, hydrALAZINE, loratadine, morphine injection, ondansetron **OR** ondansetron (ZOFRAN) IV, oxyCODONE, polyethylene glycol, sodium chloride    Patient Profile   80 y/o woman as above with COPD, HTN and 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   Patient with poor response to PDE-5 and ERA.- stopped.  Assessment/Plan     A/C Respiratory Failure, multifactorial with PAH, COPD, ? PNA  -Chronically on 8 liters [place], multiple inhalers, and macitentan.   - On admit placed on NRB--> increasing 02 requirements, now on 20 liters HFNC with stable saturations.  -Afebrile, Procalcitonin is 0.7 >0.52>0.27 - WBC 15-->13 -->13 -Placed on IV antibiotics for possible PNA => ceftriaxone/azithromycin. Pulmonary following.  -Decent response to one time dose of IV lasix. Now on po lasix 40 mg daily. Renal function stable. At home she was taking 20 mg po lasix every other day.  -Ordered daily weights and Is/Os   2. Pulmonary Hypertension   Echo (5/22) with EF 60-65%, mild LVH, moderate RV enlargement, mildly decreased RV systolic function, PASP 73 mmHg. High resolution CT chest with no evidence for ILD, mild emphysema, mediastinal LAN less prominent than on prior CT and likely reactive. She had a V/Q scan in 9/21 that was not suggestive of chronic PEs and lower extremity venous dopplers with no DVT.  Unable to obtain PFTs.  She has mild OSA.  Autoimmune serologies negative.  RHC in 5/22 with PAH, PVR 7.6 WU.  Cause of PAH is uncertain, concerned for component of group 1 PH as mild OSA and mild COPD did not appear to explain.  There has been some concern that she could have sarcoidosis, but CT chest is not suggestive of this.  She has not responded well to macitentan and tadalafil. Tadalafil was stopped 11/03/20  - Tyvaso had been ordered on 7/25 but she has not started and at this point not sure it will help.  -Macitentan now stopped due to intolerance.  -She has severe pulmonary hypertension,  not explained by her degree of parenchymal lung disease on CT (mild emphysema).  Concerned about the possbility that she has PVOD.  Therefore, will not try her on additional selective pulmonary vasodilators (will not start Tyvaso).  We are going to be left with symptomatic treatment only at this point => oxygen, po Lasix (on 40 mg daily), and treatment of PNA with abx. - Palliative Care consult for [place].    3. Hypertension -On toprol xl and amlodipine.  -Lisnopril stopped yesterday. -BP soft today -Stop amlodipine.   4. 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 -Continue ASA, plavix, and statin.   Palliative Care consulted for [place].  Consult PT.  TOC following  Length of Stay: 3  FINCH, LINDSAY N, PA-C  11/26/2020, 8:29 AM  Advanced Heart Failure Team Pager [phone] (M-F; 7a - 5p)  Please contact [facility] for night-coverage after hours (5p -7a ) and weekends on [email or web address]  Patient seen with PA, agree with the above note.   Continues on ceftriaxone/azithromycin for possible RUL PNA.  Still on 20 L HFNC (uses 8 L at home).  Says she feels comfortable.    General: NAD Neck: No JVD, no thyromegaly or thyroid nodule.  Lungs: Mild dry crackles at bases.  CV: Nondisplaced PMI.  Heart regular S1/S2, no S3/S4, no murmur.  No peripheral edema.  No carotid bruit.  Normal pedal pulses.  Abdomen: Soft, nontender, no hepatosplenomegaly, no distention.  Skin: Intact without lesions or rashes.  Neurologic: Alert and oriented x 3.  Psych: Normal affect. Extremities: No clubbing or cyanosis.  HEENT: Normal.    Patient has worsened clinically with use of macitentan and with use of tadalafil, now off both.  She has severe pulmonary hypertension, not explained by her degree of parenchymal lung disease on CT (mild emphysema).  I am concerned about the  possbility that she has PVOD.  I would, therefore, not try her on additional selective pulmonary vasodilators (will not start Tyvaso).  I am afraid that we are going to be left with symptomatic treatment only at this point => oxygen, po Lasix (will use 40 mg daily, volume status looks ok today), and treatment of PNA with abx.  Agree with palliative care, best option may be hospice.    I will also stop amlodipine today given relatively low BP.   Loralie Champagne 11/26/2020

## 2020-11-26 NOTE — Progress Notes (Deleted)
Additional non-face to face encounter-  Received call from Orlene Erm who is a Education officer, museum with [facility].  Patient is under their services- she participates regularly in their adult day program and prior to this admission was doing well with no signs of agitation or combativeness.  [name] is fully connected with [facility] and is able to receive nursing home level care at home in the community if needed.  Interesting to note that patient has underlying dementia- her last MMSE was 16/30 indicating moderate dementia.   Marcie Bal offered for patient's primary care MD to be in contact with attending hospitalist- his name is Barney Drain. She also offered assistance with discharge arrangements.   Marcie Bal and Dr. Bradd Burner can both be reached at [phone].  Mariana Kaufman, AGNP-C Palliative Medicine  Additional time: 35 minutes

## 2020-11-26 NOTE — Progress Notes (Signed)
Pt presenting with soft systolic b/p , antihypertensives were not given, pt attending is aware

## 2020-11-27 DIAGNOSIS — J9621 Acute and chronic respiratory failure with hypoxia: Secondary | ICD-10-CM | POA: Diagnosis not present

## 2020-11-27 DIAGNOSIS — Z515 Encounter for palliative care: Secondary | ICD-10-CM

## 2020-11-27 DIAGNOSIS — I6529 Occlusion and stenosis of unspecified carotid artery: Secondary | ICD-10-CM

## 2020-11-27 DIAGNOSIS — I10 Essential (primary) hypertension: Secondary | ICD-10-CM

## 2020-11-27 DIAGNOSIS — E039 Hypothyroidism, unspecified: Secondary | ICD-10-CM

## 2020-11-27 LAB — CBC
HCT: 33.5 % — ABNORMAL LOW (ref 36.0–46.0)
Hemoglobin: 10.7 g/dL — ABNORMAL LOW (ref 12.0–15.0)
MCH: 29.6 pg (ref 26.0–34.0)
MCHC: 31.9 g/dL (ref 30.0–36.0)
MCV: 92.8 fL (ref 80.0–100.0)
Platelets: 288 10*3/uL (ref 150–400)
RBC: 3.61 MIL/uL — ABNORMAL LOW (ref 3.87–5.11)
RDW: 14.3 % (ref 11.5–15.5)
WBC: 12.8 10*3/uL — ABNORMAL HIGH (ref 4.0–10.5)
nRBC: 0 % (ref 0.0–0.2)

## 2020-11-27 LAB — BASIC METABOLIC PANEL
Anion gap: 8 (ref 5–15)
BUN: 9 mg/dL (ref 8–23)
CO2: 24 mmol/L (ref 22–32)
Calcium: 9 mg/dL (ref 8.9–10.3)
Chloride: 104 mmol/L (ref 98–111)
Creatinine, Ser: 0.88 mg/dL (ref 0.44–1.00)
GFR, Estimated: >60 mL/min (ref >60)
Glucose, Bld: 96 mg/dL (ref 70–99)
Potassium: 3.6 mmol/L (ref 3.5–5.1)
Sodium: 136 mmol/L (ref 135–145)

## 2020-11-27 LAB — PROCALCITONIN: Procalcitonin: 0.17 ng/mL

## 2020-11-27 LAB — MAGNESIUM: Magnesium: 2.3 mg/dL (ref 1.7–2.4)

## 2020-11-27 MED ORDER — CARBAMIDE PEROXIDE 6.5 % OT SOLN
5.0000 [drp] | Freq: Two times a day (BID) | OTIC | Status: DC
  Administered 2020-11-28: 5 [drp] via OTIC
  Filled 2020-11-27 (×2): qty 15

## 2020-11-27 NOTE — Progress Notes (Signed)
[facility] Progress Note  Patient: [name]    EHU:[id number]  DOA: 11/22/2020     Date of Service: 11/27/2020  Brief hospital course: Patient is 80 years old female with past medical history of pulmonary hypertension, COPD, chronic respiratory failure on 8 L of oxygen at baseline, hypothyroidism, generalized weakness presented to the hospital with shortness of breath.  She was noted to have right lower lobe pneumonia and is currently getting antibiotics.  Patient has had increased oxygen demand starting 11/25/2020 and is currently on 15 L of nonrebreather mask.    Assessment and Plan:  Acute on chronic hypoxic respiratory failure likely secondary to right upper lobe pneumonia, pulmonary hypertension. Patient presented with progressive cough worsening hypoxia.  At home patient is on 8 L of oxygen by nasal cannula but currently on 20 L of nonrebreather mask.  Patient desaturates with minimal exertion.  Currently on heated high flow oxygen.  CTA of the chest shows no evidence of pulmonary embolism.  CT scan shows right upper lobe pneumonia.  on IV antibiotics.  Continue diuresis with Lasix.  Continue azithromycin and Rocephin for pneumonia coverage.  Procalcitonin down to 0.1.  Legionella negative.  Urinary strep is negative.    Pulmonary hypertension  per cardiology.    Currently on 40 mg of Lasix orally daily.  Palliative care on board.  Overall poor prognosis.  Macitentan stopped due to intolerance.  Hypothyroidism Continue Synthroid.  Essential hypertension Was on Toprol, Norvasc and lisinopril.  Currently on hold due to low blood pressure.  PVD, subclavian occlusion Continue aspirin, Plavix, and Zocor  Goals of care conversation Palliative care is involved.  Patient was not a good candidate for intubation.  Patient has overall poor prognosis.  Discussion of hospice is underway but patient's family still thinking about also spoke about goals of care and possible hospice  intervention as outpatient.  Scheduled Meds:  arformoterol  15 mcg Nebulization BID   aspirin  81 mg Oral Daily   budesonide  0.5 mg Nebulization BID   clopidogrel  75 mg Oral Daily   docusate sodium  100 mg Oral BID   enoxaparin (LOVENOX) injection  40 mg Subcutaneous Daily   feeding supplement  237 mL Oral BID BM   furosemide  40 mg Oral Daily   gabapentin  100 mg Oral QHS   ipratropium  2 spray Each Nare TID   latanoprost  1 drop Both Eyes QHS   levothyroxine  75 mcg Oral Daily   multivitamin with minerals  1 tablet Oral Daily   pantoprazole  40 mg Oral Daily   potassium chloride  20 mEq Oral Daily   saccharomyces boulardii  250 mg Oral Daily   simvastatin  20 mg Oral q1800   sodium chloride flush  3 mL Intravenous Q12H   Continuous Infusions:   PRN Meds: acetaminophen **OR** acetaminophen, albuterol, bisacodyl, diclofenac Sodium, guaiFENesin-dextromethorphan, hydrALAZINE, loratadine, morphine injection, ondansetron **OR** ondansetron (ZOFRAN) IV, oxyCODONE, polyethylene glycol, sodium chloride   DVT Prophylaxis:   enoxaparin (LOVENOX) injection 40 mg Start: 11/23/20 1000    CODE STATUS.   Full code.  Family Communication:  I again spoke with the patient daughter at bedside  Data Reviewed: I have reviewed labs and imaging studies.  Subjective:   Patient subjectively feels okay.  Denies increasing shortness of breath cough or fever.  Denies nausea vomiting or pain.  She however continues to be on high flow oxygen at 20 L/min.   Objective  Physical Exam:  Vitals:   11/26/20 0310 11/26/20 0456 11/26/20 0754 11/26/20 1139  BP:  (!) 93/45    Pulse: 74 75 86 99  Resp:  20 (!) 24 (!) 24  Temp:      TempSrc:      SpO2: 91% 92% 94% 91%  Weight:      Height:       General:  Average built, alert awake and communicative. HENT:   No scleral pallor or icterus noted. Oral mucosa is moist.  On high flow nasal cannula at 20 L/min. Chest: Diminished breath sounds  bilaterally. CVS: S1 &S2 heard. No murmur.  Regular rate and rhythm. Abdomen: Soft, nontender, nondistended.  Bowel sounds are heard.   Extremities: No cyanosis, clubbing with trace edema. peripheral pulses are palpable. Psych: Alert, awake and oriented, normal mood CNS:  No cranial nerve deficits.  Power equal in all extremities.   Skin: Warm and dry.  No rashes noted.  Disposition:  Status is: Inpatient  Remains inpatient appropriate because:Hemodynamically unstable, severe pulmonary hypertension, acute on chronic hypoxic respiratory failure,  Dispo: The patient is from: Home              Anticipated d/c is to: Home health/discussion about hospice.              Patient currently is not medically stable to d/c.   Difficult to place patient No  Flora Lipps, MD [facility] 11/27/2020 10:57 AM

## 2020-11-27 NOTE — Progress Notes (Signed)
Daily Progress Note   Patient Name: [name]       Date: 11/27/2020 DOB: [birth year]  Age: 80 y.o. MRN#: [id number] Attending Physician: Flora Lipps, MD Primary Care Physician: [name], MD Admit Date: 11/22/2020  Reason for Consultation/Follow-up: Establishing goals of care  Patient Profile/HPI:  80 y.o. female  with past medical history of COPD, PAH, hypothyroidism, hypertension admitted on 11/22/2020 with acute on chronic respiratory failure secondary to possible pnuemonia in the setting of advanced PAH and COPD. Currently on 20L HFNC. No longer candidate for advanced medications for PAH. Diuresing well on lasix. Completing antibiotic course on 8/19. Palliative medicine consulted for goals of care discussion.     Subjective: [name] is awake and alert. Daughter, [name], is at bedside.  [name] shares that she feels great, has no worries.  She and [name] tell me they now wish for [name] to go home with Hospice services at home.  I reviewed the goals of Hospice with them- ensuring patient lives well with symptoms managed until end of life, not returning to hospital, dying at home. [name] agrees to this.  Discussed code status- if she is full code then won't meet goal of dying at home. [name] and [name] both agree that a DNR order is in line with goals of care.  We discussed they dying process and their expectations. I also discussed utilizing opioids for SOB as it is likely to increase over time- they are in agreement.   Review of Systems  Respiratory:  Negative for shortness of breath.   Musculoskeletal:  Negative for myalgias.  Psychiatric/Behavioral:  Negative for depression. The patient does not have insomnia.     Physical Exam Vitals and nursing note reviewed.   Constitutional:      Appearance: Normal appearance.  Pulmonary:     Effort: Pulmonary effort is normal.     Comments: On high flow oxygen Neurological:     General: No focal deficit present.     Mental Status: She is alert and oriented to person, place, and time.            Vital Signs: BP (!) 121/50   Pulse 97   Temp 97.8 F (36.6 C) (Oral)   Resp 20   Ht _0  (1.6 m)   Wt 53.5 kg   SpO2  94%   BMI 20.89 kg/m  SpO2: SpO2: 94 % O2 Device: O2 Device: High Flow Nasal Cannula O2 Flow Rate: O2 Flow Rate (L/min): 15 L/min  Intake/output summary:  Intake/Output Summary (Last 24 hours) at 11/27/2020 1639 Last data filed at 11/27/2020 1230 Gross per 24 hour  Intake 740 ml  Output --  Net 740 ml   LBM: Last BM Date: 11/27/20 Baseline Weight: Weight: 56.2 kg Most recent weight: Weight: 53.5 kg       Palliative Assessment/Data: PPS: 50%      Patient Active Problem List   Diagnosis Date Noted   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   COPD with acute exacerbation ([place]) 08/27/2020   COVID-19 05/01/2020   Pulmonary hypertension ([place]) 01/15/2020   Hypoxemic respiratory failure, chronic ([place]) 01/15/2020   Lung nodule, multiple 01/15/2020   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   SOB (shortness of breath) 12/26/2019   Acute on chronic respiratory failure with hypoxia ([place]) 12/25/2019   Acute respiratory failure with hypoxemia ([place]) 12/25/2019   Essential hypertension 06/06/2017   Hyperlipidemia 06/06/2017   Leg cramps 06/06/2017   Vertigo 06/06/2017   Macular degeneration 06/06/2017   Hypothyroidism 06/06/2017   Carotid stenosis 01/22/2014   Aftercare following surgery of the circulatory system, NEC 01/02/2013   Occlusion and stenosis of carotid artery without mention of cerebral infarction 04/27/2011    Palliative Care Assessment & Plan    Assessment/Recommendations/Plan  DNR TOC referral to arrange Hospice services at home On discharge, would recommend scripts  for: - Morphine Concentrate 65m/0.5ml: 562m(0.2514msublingual every 1 hour as needed for pain or shortness of breath: Disp 20m68mLorazepam 2mg/20mconcentrated solution: 1mg (74mml) s67mingual every 4 hours as needed for anxiety: Disp 20ml   91me Status: DNR  Prognosis:  < 6 months  Discharge Planning: Home with Hospice  Care plan was discussed with patient, daughter, and care team.  Thank you for allowing the Palliative Medicine Team to assist in the care of this patient.  Total time: 36 mins Prolonged billing: No     Greater than 50%  of this time was spent counseling and coordinating care related to the above assessment and plan.  Lewellyn Fultz MaMariana Kaufman Palliative Medicine   Please contact Palliative Medicine Team phone at [phone] and concerns.

## 2020-11-27 NOTE — TOC Progression Note (Addendum)
Transition of Care (TOC) - Progression Note  Heart Failure   Patient Details  Name: [name] MRN: [id number] Date of Birth: January 27, 1941  Transition of Care [facility]) CM/SW [name], [place] Phone Number: 11/27/2020, 5:29 PM  Clinical Narrative:    CSW and RNCM spoke with the patient and family at bedside about hospice. DNR form is on the front of the patients chart awaiting MD signature. Secure chat sent regarding DNR to treatment team and attending MD.   Expected Discharge Plan: Home w Hospice Care Barriers to Discharge: Continued Medical Work up  Expected Discharge Plan and Services Expected Discharge Plan: Home w Hospice Care In-house Referral: Clinical Social Work Discharge Planning Services: CM Consult Post Acute Care Choice: Hospice Living arrangements for the past 2 months: Single Family Home                           HH Arranged: RN [facility] Agency: Hospice and [place] Date [place]: 11/27/20   Representative spoke with at [facility] Agency: Venia Carbon RN   Social Determinants of Health (SDOH) Interventions Food Insecurity Interventions: Intervention Not Indicated Financial Strain Interventions: Intervention Not Indicated Housing Interventions: Intervention Not Indicated Transportation Interventions: Intervention Not Indicated, Other (Comment) (patient reports her daughter takes her to appointments)  Readmission Risk Interventions Readmission Risk Prevention Plan 11/26/2020  Transportation Screening Complete  Medication Review Press photographer) Complete  HRI or [place] Complete  SW Recovery Care/Counseling Consult Complete  Palliative Care Screening Not [place] Not Applicable  Some recent data might be hidden   Jeffery Bachmeier, MSW, LCSWA [phone] Heart Failure Social Worker

## 2020-11-27 NOTE — TOC Initial Note (Signed)
Transition of Care [facility]) - Initial/Assessment Note    Patient Details  Name: [name] MRN: [id number] Date of Birth: 06-24-1940  Transition of Care [facility]) CM/SW Contact:    Erenest Rasher, RN Phone Number: 11/27/2020, 5:04 PM  Clinical Narrative:                 HF  TOC CM/CSW spoke to pt and dtrs at bedside. Offered choice for Home Hospice. Dtr is requesting [facility]. Contacted [facility] rep, Anderson Malta with new referral. States she will review chart and follow up with family to get DME arranged for home. Pt will need hospital bed.   Expected Discharge Plan: Home w Hospice Care Barriers to Discharge: Continued Medical Work up   Patient Goals and CMS Choice Patient states their goals for this hospitalization and ongoing recovery are:: patient and family has decided on [place] CMS Medicare.gov Compare Post Acute Care list provided to:: Patient Choice offered to / list presented to : Patient, Adult Children  Expected Discharge Plan and Services Expected Discharge Plan: Home w Hospice Care In-house Referral: Clinical Social Work Discharge Planning Services: CM Consult Post Acute Care Choice: Hospice Living arrangements for the past 2 months: [place]: RN [facility] Agency: Hospice and [place] Date [place]: 11/27/20   Representative spoke with at [place]: Venia Carbon RN  Prior Living Arrangements/Services Living arrangements for the past 2 months: Single Family Home Lives with:: Adult Children Patient language and need for interpreter reviewed:: Yes Do you feel safe going back to the place where you live?: Yes      Need for Family Participation in Patient Care: Yes (Comment) Care giver support system in place?: Yes (comment) Current home services: DME ([facility], RW with seat) Criminal Activity/Legal Involvement Pertinent to Current Situation/Hospitalization: No -  Comment as needed  Activities of Daily Living Home Assistive Devices/Equipment: None ADL Screening (condition at time of admission) Patient's cognitive ability adequate to safely complete daily activities?: Yes Is the patient deaf or have difficulty hearing?: Yes Does the patient have difficulty seeing, even when wearing glasses/contacts?: Yes Does the patient have difficulty concentrating, remembering, or making decisions?: Yes Patient able to express need for assistance with ADLs?: No Does the patient have difficulty dressing or bathing?: Yes Independently performs ADLs?: Yes (appropriate for developmental age) Does the patient have difficulty walking or climbing stairs?: No Weakness of Legs: None Weakness of Arms/Hands: None  Permission Sought/Granted Permission sought to share information with : Case Manager, Family Supports, PCP Permission granted to share information with : Yes, Verbal Permission Granted  Share Information with NAME: Gray Bernhardt  Permission granted to share info w AGENCY: [place] granted to share info w Relationship: daughter  Permission granted to share info w Contact Information: [phone]  Emotional Assessment Appearance:: Appears stated age Attitude/Demeanor/Rapport: Gracious, Engaged Affect (typically observed): Accepting Orientation: : Oriented to Self, Oriented to Place, Oriented to  Time, Oriented to Situation Alcohol / Substance Use: Not Applicable Psych Involvement: No (comment)  Admission diagnosis:  Hypoxia [R09.02] Acute on chronic respiratory failure with hypoxia (HCC) [J96.21] Community acquired pneumonia of right upper lobe of lung [J18.9] Patient Active Problem List   Diagnosis Date Noted   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   COPD with acute exacerbation ([place]) 08/27/2020   COVID-19 05/01/2020  Pulmonary hypertension ([place]) 01/15/2020   Hypoxemic respiratory failure, chronic ([place]) 01/15/2020   Lung nodule, multiple  01/15/2020   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   SOB (shortness of breath) 12/26/2019   Acute on chronic respiratory failure with hypoxia ([place]) 12/25/2019   Acute respiratory failure with hypoxemia ([place]) 12/25/2019   Essential hypertension 06/06/2017   Hyperlipidemia 06/06/2017   Leg cramps 06/06/2017   Vertigo 06/06/2017   Macular degeneration 06/06/2017   Hypothyroidism 06/06/2017   Carotid stenosis 01/22/2014   Aftercare following surgery of the circulatory system, NEC 01/02/2013   Occlusion and stenosis of carotid artery without mention of cerebral infarction 04/27/2011   PCP:  [name], MD Pharmacy:   [id number] [place], [place]  [id number]: [phone]: [phone] [place]- [address] WJanann ColonelPCold Springs[address]Phone: [phone]: [phone]    Social Determinants of Health (SDOH) Interventions    Readmission Risk Interventions Readmission Risk Prevention Plan 11/26/2020  Transportation Screening Complete  Medication Review (RBer[place] Complete  HRI or HWare PlaceComplete  SW Recovery Care/Counseling Consult Complete  PGoodnews BayNot Applicable  Some recent data might be hidden

## 2020-11-27 NOTE — Progress Notes (Signed)
Occupational Therapy Treatment Patient Details Name: [name] MRN: [id number] DOB: 09/14/1940 Today's Date: 11/27/2020    History of present illness Pt is a 80 yr old female who preented to ED as increase in SOB. Pt reports pt is normally on 8L at rest and 10L with movment in the home but noted in the last week they started to have changes in o2 readings into the 80s. PMH: CAD, COPD, glacoma, pulmonary HTN   OT comments  Pt presented in bed with HOB elevated completed supine to sitting with supervision and stand pivot transfer to chair with min guard. Pt took increase time for each positional change due to o2 reading. Pt was on HF 20 L of o2 stating between high 80s to 90s%. Pt did not report any SOB or dizziness in session. Pt and family member was educated about compensations on how to complete tasks as very limited ability to complete any activity at this time. Pt currently with functional limitations due to the deficits listed below (see OT Problem List).  Pt will benefit from skilled OT to increase their safety and independence with ADL and functional mobility for ADL to facilitate discharge to venue listed below.    Follow Up Recommendations  Home health OT;Supervision/Assistance - 24 hour (Pt may benfit from a hospital bed due to decrease in o2 status in normal supine bed and changes in position)    Equipment Recommendations  Tub/shower bench;3 in 1 bedside commode    Recommendations for Other Services      Precautions / Restrictions Precautions Precautions: Fall;Other (comment) (o2/HR) Restrictions Weight Bearing Restrictions: No       Mobility Bed Mobility Overal bed mobility: Needs Assistance Bed Mobility: Rolling;Supine to Sit Rolling: Supervision   Supine to sit: Supervision     General bed mobility comments: Pt started with HOB elevated to decrease risk of destating    Transfers Overall transfer level: Needs assistance Equipment used: 1 person hand held  assist Transfers: Sit to/from Stand Sit to Stand: Min guard         General transfer comment: Pt takes increase time for any positional movements at this time pt in session on HF via [place] 20 L of o2    Balance Overall balance assessment: Needs assistance Sitting-balance support: No upper extremity supported;Feet supported Sitting balance-Leahy Scale: Fair                                     ADL either performed or assessed with clinical judgement   ADL Overall ADL's : Needs assistance/impaired Eating/Feeding: Set up;Sitting   Grooming: Wash/dry hands;Set up;Sitting;Cueing for safety;Cueing for sequencing   Upper Body Bathing: Minimal assistance;Cueing for safety;Cueing for sequencing;Sitting   Lower Body Bathing: Moderate assistance;Cueing for safety;Cueing for sequencing;Sit to/from stand;Sitting/lateral leans   Upper Body Dressing : Minimal assistance;Cueing for safety;Cueing for sequencing;Sitting   Lower Body Dressing: Moderate assistance;Cueing for safety;Cueing for sequencing;Sit to/from stand;Sitting/lateral leans   Toilet Transfer: Min guard;Cueing for safety;Cueing for sequencing;BSC;Stand-pivot   Toileting- Clothing Manipulation and Hygiene: Moderate assistance;Cueing for safety;Cueing for sequencing;Sit to/from stand       Functional mobility during ADLs: Min guard;Cueing for sequencing;Cueing for safety       Vision       Perception     Praxis      Cognition Arousal/Alertness: Awake/alert Behavior During Therapy: WFL for tasks assessed/performed Overall Cognitive Status: Within Functional Limits for tasks  assessed                                          Exercises     Shoulder Instructions       General Comments      Pertinent Vitals/ Pain       Pain Assessment: No/denies pain  Home Living                                          Prior Functioning/Environment               Frequency  Min 2X/week        Progress Toward Goals  OT Goals(current goals can now be found in the care plan section)  Progress towards OT goals: Progressing toward goals  Acute Rehab OT Goals Patient Stated Goal: to just rest in the chair for awhile OT Goal Formulation: With patient Time For Goal Achievement: 12/13/20 Potential to Achieve Goals: Good ADL Goals Pt Will Perform Grooming: with modified independence;sitting Pt Will Perform Upper Body Bathing: with set-up;sitting Pt Will Perform Lower Body Bathing: with min assist;sit to/from stand Pt Will Transfer to Toilet: with supervision;ambulating;regular height toilet  Plan Discharge plan remains appropriate    Co-evaluation                 AM-PAC OT "6 Clicks" Daily Activity     Outcome Measure   Help from another person eating meals?: None Help from another person taking care of personal grooming?: A Little Help from another person toileting, which includes using toliet, bedpan, or urinal?: A Little Help from another person bathing (including washing, rinsing, drying)?: A Lot Help from another person to put on and taking off regular upper body clothing?: A Little Help from another person to put on and taking off regular lower body clothing?: A Lot 6 Click Score: 17    End of Session Equipment Utilized During Treatment: Gait belt  OT Visit Diagnosis: Unsteadiness on feet (R26.81);Other abnormalities of gait and mobility (R26.89);Repeated falls (R29.6);Muscle weakness (generalized) (M62.81)   Activity Tolerance Other (comment) (limited by vitals)   Patient Left in chair;with call bell/phone within reach;with chair alarm set   Nurse Communication Mobility status;Other (comment) (nurse present in room in session)        Time: [id number] OT Time Calculation (min): 23 min  Charges: OT General Charges $OT Visit: 1 Visit OT Treatments $Self Care/Home Management : 23-37 mins  Joeseph Amor OTR/L   Acute Rehab Services  [phone] office number [phone] pager number    Joeseph Amor 11/27/2020, 10:50 AM

## 2020-11-27 NOTE — Plan of Care (Signed)
  Problem: Education: Goal: Knowledge of General Education information will improve Description: Including pain rating scale, medication(s)/side effects and non-pharmacologic comfort measures 11/27/2020 1231 by Donzetta Sprung, RN Outcome: Progressing 11/27/2020 1231 by Donzetta Sprung, RN Outcome: Progressing   Problem: Health Behavior/Discharge Planning: Goal: Ability to manage health-related needs will improve 11/27/2020 1231 by Donzetta Sprung, RN Outcome: Progressing 11/27/2020 1231 by Donzetta Sprung, RN Outcome: Progressing   Problem: Clinical Measurements: Goal: Ability to maintain clinical measurements within normal limits will improve 11/27/2020 1231 by Donzetta Sprung, RN Outcome: Progressing 11/27/2020 1231 by Donzetta Sprung, RN Outcome: Progressing Goal: Will remain free from infection Outcome: Progressing Goal: Diagnostic test results will improve Outcome: Progressing Goal: Respiratory complications will improve Outcome: Progressing Goal: Cardiovascular complication will be avoided Outcome: Progressing   Problem: Activity: Goal: Risk for activity intolerance will decrease Outcome: Progressing   Problem: Nutrition: Goal: Adequate nutrition will be maintained Outcome: Progressing   Problem: Coping: Goal: Level of anxiety will decrease Outcome: Progressing   Problem: Elimination: Goal: Will not experience complications related to bowel motility Outcome: Progressing Goal: Will not experience complications related to urinary retention Outcome: Progressing   Problem: Pain Managment: Goal: General experience of comfort will improve Outcome: Progressing   Problem: Safety: Goal: Ability to remain free from injury will improve Outcome: Progressing   Problem: Skin Integrity: Goal: Risk for impaired skin integrity will decrease Outcome: Progressing

## 2020-11-27 NOTE — Progress Notes (Addendum)
Advanced Heart Failure Rounding Note  PCP-Cardiologist: Dr. Aundra Dubin  Subjective:    On 40 mg furosemide daily. Is/Os not accurate. No daily weights.  Macitentan stopped due to intolerance.   Continues on 20 L/min HFNC (on 8-9 L  at home)  Reports feeling well today.  BP soft  Objective:   Weight Range: 56.2 kg Body mass index is 21.97 kg/m.   Vital Signs:   Temp:  [98.2 F (36.8 C)-98.3 F (36.8 C)] 98.3 F (36.8 C) (08/17 2305) Pulse Rate:  [75-99] 76 (08/18 0809) Resp:  [18-26] 24 (08/18 0809) BP: (95-127)/(51-73) 97/51 (08/18 0809) SpO2:  [91 %-97 %] 97 % (08/18 0809) FiO2 (%):  [70 %] 70 % (08/18 0809)    Weight change: Filed Weights   11/23/20 0632  Weight: 56.2 kg    Intake/Output:   Intake/Output Summary (Last 24 hours) at 11/27/2020 0910 Last data filed at 11/26/2020 1642 Gross per 24 hour  Intake 250 ml  Output --  Net 250 ml      Physical Exam    General:  Lying comfortably in bed. No resp difficulty on 20 L HFNC HEENT: Normal Neck: Supple. JVP not elevated.  Carotids 2+ bilat; no bruits. No lymphadenopathy or thyromegaly appreciated. Cor: PMI nondisplaced. Regular rate & rhythm. No rubs, gallops or murmurs. Lungs: Diminished  Abdomen: Soft, nontender, nondistended. No hepatosplenomegaly. No bruits or masses. Good bowel sounds. Extremities: No cyanosis, clubbing, rash, edema Neuro: Alert & orientedx3, cranial nerves grossly intact. moves all 4 extremities w/o difficulty. Affect pleasant   Telemetry   Sinus 70s-80s, PACs  EKG    N/A  Labs    CBC Recent Labs    11/26/20 0411 11/27/20 0148  WBC 13.0* 12.8*  HGB 11.0* 10.7*  HCT 34.2* 33.5*  MCV 92.9 92.8  PLT 279 161   Basic Metabolic Panel Recent Labs    11/26/20 0411 11/27/20 0148  NA 138 136  K 3.7 3.6  CL 106 104  CO2 24 24  GLUCOSE 97 96  BUN 14 9  CREATININE 0.79 0.88  CALCIUM 9.0 9.0  MG 2.2 2.3   Liver Function Tests No results for input(s): AST,  ALT, ALKPHOS, BILITOT, PROT, ALBUMIN in the last 72 hours.  No results for input(s): LIPASE, AMYLASE in the last 72 hours. Cardiac Enzymes No results for input(s): CKTOTAL, CKMB, CKMBINDEX, TROPONINI in the last 72 hours.  BNP: BNP (last 3 results) Recent Labs    09/25/20 1459 11/03/20 1054 11/22/20 2300  BNP 77.0 102.4* 105.7*    ProBNP (last 3 results) No results for input(s): PROBNP in the last 8760 hours.   D-Dimer No results for input(s): DDIMER in the last 72 hours.  Hemoglobin A1C No results for input(s): HGBA1C in the last 72 hours. Fasting Lipid Panel No results for input(s): CHOL, HDL, LDLCALC, TRIG, CHOLHDL, LDLDIRECT in the last 72 hours. Thyroid Function Tests No results for input(s): TSH, T4TOTAL, T3FREE, THYROIDAB in the last 72 hours.  Invalid input(s): FREET3  Other results:   Imaging    No results found.   Medications:     Scheduled Medications:  arformoterol  15 mcg Nebulization BID   aspirin  81 mg Oral Daily   budesonide  0.5 mg Nebulization BID   clopidogrel  75 mg Oral Daily   docusate sodium  100 mg Oral BID   enoxaparin (LOVENOX) injection  40 mg Subcutaneous Daily   feeding supplement  237 mL Oral BID BM   furosemide  40 mg Oral Daily   gabapentin  100 mg Oral QHS   ipratropium  2 spray Each Nare TID   latanoprost  1 drop Both Eyes QHS   levothyroxine  75 mcg Oral Daily   metoprolol succinate  12.5 mg Oral Daily   multivitamin with minerals  1 tablet Oral Daily   pantoprazole  40 mg Oral Daily   potassium chloride  20 mEq Oral Daily   saccharomyces boulardii  250 mg Oral Daily   simvastatin  20 mg Oral q1800   sodium chloride flush  3 mL Intravenous Q12H    Infusions:  azithromycin Stopped (11/26/20 0912)    PRN Medications: acetaminophen **OR** acetaminophen, albuterol, bisacodyl, diclofenac Sodium, guaiFENesin-dextromethorphan, hydrALAZINE, loratadine, morphine injection, ondansetron **OR** ondansetron (ZOFRAN) IV,  oxyCODONE, polyethylene glycol, sodium chloride    Patient Profile   80 y/o woman as above with COPD, HTN and 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   Patient with poor response to PDE-5 and ERA.- stopped.  Assessment/Plan     A/C Respiratory Failure, multifactorial with PAH, COPD, ? PNA  -Chronically on 8 liters [place], multiple inhalers, and macitentan.   - On admit placed on NRB--> increasing 02 requirements, now on 20 liters HFNC with stable saturations at rest.  -Afebrile, Procalcitonin is 0.7 >0.52>0.27 - WBC 15-->13 -->13 -Placed on IV antibiotics for possible PNA => ceftriaxone/azithromycin. Pulmonary following.  -Decent response to one time dose of IV lasix. Now on po lasix 40 mg daily. Renal function stable. At home she was taking 20 mg po lasix every other day.  -Daily weights and Is/Os ordered again   2. Pulmonary Hypertension   Echo (5/22) with EF 60-65%, mild LVH, moderate RV enlargement, mildly decreased RV systolic function, PASP 73 mmHg. High resolution CT chest with no evidence for ILD, mild emphysema, mediastinal LAN less prominent than on prior CT and likely reactive. She had a V/Q scan in 9/21 that was not suggestive of chronic PEs and lower extremity venous dopplers with no DVT.  Unable to obtain PFTs.  She has mild OSA.  Autoimmune serologies negative.  RHC in 5/22 with PAH, PVR 7.6 WU.  Cause of PAH is uncertain, concerned for component of group 1 PH as mild OSA and mild COPD did not appear to explain.  There has been some concern that she could have sarcoidosis, but CT chest is not suggestive of this.  She has not responded well to macitentan and tadalafil. Tadalafil was stopped 11/03/20  - Tyvaso had been ordered on 7/25 but she has not started and at this point not sure it will help.  -Macitentan now stopped due to intolerance.  -She has severe pulmonary hypertension, not explained by her degree of parenchymal lung disease on CT (mild emphysema).  Concerned about the  possbility that she has PVOD.  Therefore, will not try her on additional selective pulmonary vasodilators (will not start Tyvaso).  We are going to be left with symptomatic treatment only at this point => oxygen, po Lasix (on 40 mg daily), and treatment of PNA with abx.   3. Hypertension -On toprol xl 12.5 qd -Lisnopril and amlodipine stopped due to hypotension -BP remains soft. Stop beta blocker   4. 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 -Continue ASA, plavix, and statin.   GOC: -Met with palliative care yesterday. Still full code.  -  She has been discussing wishes with her daughter. Now considering HH with hospice  Length of Stay: [place], LINDSAY N, PA-C  11/27/2020, 9:10 AM  Advanced Heart Failure Team Pager [phone] (M-F; 7a - 5p)  Please contact [facility] for night-coverage after hours (5p -7a ) and weekends on [email or web address]  Patient seen with PA, agree with the above note.    Still on 20 L HFNC (uses 8 L at home).  Says she feels comfortable.    General: NAD Neck: No JVD, no thyromegaly or thyroid nodule.  Lungs: Mildly decreased breath sounds.  CV: Nondisplaced PMI.  Heart regular S1/S2, no S3/S4, no murmur.  No peripheral edema.   Abdomen: Soft, nontender, no hepatosplenomegaly, no distention.  Skin: Intact without lesions or rashes.  Neurologic: Alert and oriented x 3.  Psych: Normal affect. Extremities: No clubbing or cyanosis.  HEENT: Normal.   Patient has worsened clinically with use of macitentan and with use of tadalafil, now off both.  She has severe pulmonary hypertension, not explained by her degree of parenchymal lung disease on CT (mild emphysema).  I am concerned about the possbility that she has PVOD.  I would, therefore, not try her on additional selective pulmonary vasodilators (will not start Tyvaso).  I  am afraid that we are going to be left with symptomatic treatment only at this point => oxygen, po Lasix (will use 40 mg daily, volume status looks ok today), and treatment of PNA with abx.  Agree with palliative care, best option may be hospice.    Cardiology will sign off, call with further questions.   Loralie Champagne 11/27/2020 11:03 AM

## 2020-11-27 NOTE — Progress Notes (Signed)
Physical Therapy Treatment Patient Details Name: [name] MRN: [id number] DOB: 30-Dec-1940 Today's Date: 11/27/2020    History of Present Illness Pt is a 80 yr old female who preented to ED as increase in SOB. Pt reports pt is normally on 8L at rest and 10L with movment in the home but noted in the last week they started to have changes in o2 readings into the 80s. PMH: CAD, COPD, glacoma, pulmonary HTN    PT Comments    Pt admitted with above diagnosis. Spent time discussing pt mobility with daughter.  Daughter agrees with need for RW and 3N1.  They have a wheelchair, pulse oximeter and oxygen.  Daughter feels comfortable moving pt as well.  Did discuss that if pt eventually needs hospital bed for daughter to ask Hospice. Will continue to follow while here although limited as pt desaturates with minimal movement and takes incr time to recover.  Pt is on 70% heated HFNC at present.  Pt currently with functional limitations due to balance and endurance deficits. Pt will benefit from skilled PT to increase their independence and safety with mobility to allow discharge to the venue listed below.      Follow Up Recommendations  Home health PT;Supervision/Assistance - 24 hour (If Hospice allows HHPT, would benefit)     Equipment Recommendations  Rolling walker with 5" wheels;3in1 (PT)    Recommendations for Other Services       Precautions / Restrictions Precautions Precautions: Fall Precaution Comments: O2 incr needs, Heated HFNC Restrictions Weight Bearing Restrictions: No    Mobility  Bed Mobility Overal bed mobility: Needs Assistance Bed Mobility: Rolling;Sit to Supine Rolling: Supervision   Supine to sit: Supervision Sit to supine: Supervision   General bed mobility comments: Pt on EOB on arrrival wtih nursing having just transferred her chair to bed.  PT took over rest of session. Pt sats sitting EOB were 85% after she just transferred. Took incr time to return to 93%.  Then once pt laid down, desat again to 86% and incr time to return above 90%.    Transfers Overall transfer level: Needs assistance Equipment used: 1 person hand held assist Transfers: Sit to/from Stand Sit to Stand: Min guard         General transfer comment: Nurse states pt was min guard assist for transfer.  Ambulation/Gait                 Stairs             Wheelchair Mobility    Modified Rankin (Stroke Patients Only)       Balance Overall balance assessment: Needs assistance Sitting-balance support: No upper extremity supported;Feet supported Sitting balance-Leahy Scale: Fair     Standing balance support: Bilateral upper extremity supported;During functional activity Standing balance-Leahy Scale: Poor Standing balance comment: relies on light UE support to stand                            Cognition Arousal/Alertness: Awake/alert Behavior During Therapy: WFL for tasks assessed/performed Overall Cognitive Status: Within Functional Limits for tasks assessed                                        Exercises      General Comments        Pertinent Vitals/Pain Pain Assessment: No/denies pain  Home Living                      Prior Function            PT Goals (current goals can now be found in the care plan section) Acute Rehab PT Goals Patient Stated Goal: to just rest in the chair for awhile Progress towards PT goals: Not progressing toward goals - comment (limtied by desaturation)    Frequency    Min 3X/week      PT Plan Current plan remains appropriate    Co-evaluation              AM-PAC PT "6 Clicks" Mobility   Outcome Measure  Help needed turning from your back to your side while in a flat bed without using bedrails?: A Little Help needed moving from lying on your back to sitting on the side of a flat bed without using bedrails?: A Little Help needed moving to and from a bed  to a chair (including a wheelchair)?: A Little Help needed standing up from a chair using your arms (e.g., wheelchair or bedside chair)?: A Little Help needed to walk in hospital room?: A Little Help needed climbing 3-5 steps with a railing? : A Little 6 Click Score: 18    End of Session Equipment Utilized During Treatment: Gait belt;Oxygen Activity Tolerance: Patient limited by fatigue Patient left: with call bell/phone within reach;in bed;with bed alarm set;with family/visitor present Nurse Communication: Mobility status PT Visit Diagnosis: Muscle weakness (generalized) (M62.81);Unsteadiness on feet (R26.81)     Time: 2202-5427 PT Time Calculation (min) (ACUTE ONLY): 12 min  Charges:  $Therapeutic Activity: 8-22 mins                     [name] M,PT Acute Rehab Services [phone] (pager)    Alvira Philips 11/27/2020, 1:33 PM

## 2020-11-28 ENCOUNTER — Ambulatory Visit: Payer: Medicare Other | Admitting: Internal Medicine

## 2020-11-28 DIAGNOSIS — J300 Vasomotor rhinitis: Secondary | ICD-10-CM

## 2020-11-28 LAB — CBC
HCT: 34.3 % — ABNORMAL LOW (ref 36.0–46.0)
Hemoglobin: 10.9 g/dL — ABNORMAL LOW (ref 12.0–15.0)
MCH: 29.5 pg (ref 26.0–34.0)
MCHC: 31.8 g/dL (ref 30.0–36.0)
MCV: 93 fL (ref 80.0–100.0)
Platelets: 310 10*3/uL (ref 150–400)
RBC: 3.69 MIL/uL — ABNORMAL LOW (ref 3.87–5.11)
RDW: 14.1 % (ref 11.5–15.5)
WBC: 11.5 10*3/uL — ABNORMAL HIGH (ref 4.0–10.5)
nRBC: 0 % (ref 0.0–0.2)

## 2020-11-28 LAB — BASIC METABOLIC PANEL
Anion gap: 7 (ref 5–15)
BUN: 8 mg/dL (ref 8–23)
CO2: 24 mmol/L (ref 22–32)
Calcium: 9.2 mg/dL (ref 8.9–10.3)
Chloride: 105 mmol/L (ref 98–111)
Creatinine, Ser: 0.92 mg/dL (ref 0.44–1.00)
GFR, Estimated: >60 mL/min (ref >60)
Glucose, Bld: 100 mg/dL — ABNORMAL HIGH (ref 70–99)
Potassium: 4.1 mmol/L (ref 3.5–5.1)
Sodium: 136 mmol/L (ref 135–145)

## 2020-11-28 LAB — MAGNESIUM: Magnesium: 2.3 mg/dL (ref 1.7–2.4)

## 2020-11-28 MED ORDER — DICLOFENAC SODIUM 1 % EX GEL: 2.0000 g | CUTANEOUS | 0 refills | Status: AC | PRN

## 2020-11-28 MED ORDER — CARBAMIDE PEROXIDE 6.5 % OT SOLN: 5.0000 [drp] | Freq: Two times a day (BID) | OTIC | 0 refills | Status: AC

## 2020-11-28 MED ORDER — BISACODYL 5 MG PO TBEC: 5.0000 mg | DELAYED_RELEASE_TABLET | Freq: Every day | ORAL | 0 refills | Status: AC | PRN

## 2020-11-28 MED ORDER — ENSURE ENLIVE PO LIQD: 237.0000 mL | Freq: Two times a day (BID) | ORAL | 0 refills | Status: AC

## 2020-11-28 MED ORDER — MORPHINE SULFATE (CONCENTRATE) 10 MG/0.5ML PO SOLN
5.0000 mg | ORAL | Status: DC | PRN
  Administered 2020-11-28 – 2020-11-29 (×2): 5 mg via SUBLINGUAL
  Filled 2020-11-28 (×2): qty 0.5

## 2020-11-28 MED ORDER — MORPHINE SULFATE (CONCENTRATE) 10 MG/0.5ML PO SOLN: 5.0000 mg | ORAL | 0 refills | Status: AC | PRN

## 2020-11-28 MED ORDER — LORAZEPAM 2 MG/ML PO CONC: 0.6000 mg | ORAL | 0 refills | Status: AC | PRN

## 2020-11-28 MED ORDER — POTASSIUM CHLORIDE CRYS ER 20 MEQ PO TBCR: 20.0000 meq | EXTENDED_RELEASE_TABLET | Freq: Every day | ORAL | 2 refills | Status: AC

## 2020-11-28 NOTE — Discharge Summary (Addendum)
Physician Discharge Summary  [name] OVZ:[id number] DOB: 03-27-1941 DOA: 11/22/2020  PCP: [name], MD  Admit date: 11/22/2020 Discharge date: 11/29/2020  Admitted From: Home  Discharge disposition: home Hospice  Recommendations for Outpatient Follow-Up:   Follow up with your hospice care provider.  Discharge Diagnosis:   Principal Problem:   Acute on chronic respiratory failure with hypoxia [facility]) Active Problems:   Carotid stenosis   Essential hypertension   Hypothyroidism   Pulmonary hypertension (HCC)   Vasomotor rhinitis   Discharge Condition: Improved.  Diet recommendation: Low sodium, heart healthy.     Wound care: None.  Code status: DNR   History of Present Illness:   Patient is 80 years old female with past medical history of pulmonary hypertension, COPD, chronic respiratory failure on 8 L of oxygen at baseline, hypothyroidism, generalized weakness presented to the hospital with shortness of breath.  She was noted to have right lower lobe pneumonia and also received antibiotic.Marland Kitchen   Hospital Course:   Following conditions were addressed during hospitalization as listed below,  Acute on chronic hypoxic respiratory failure likely secondary to right upper lobe pneumonia, severe pulmonary hypertension. Patient presented with progressive cough worsening hypoxia.  At home patient is on 8 L of oxygen by nasal cannula but required up to on 20 L of oxygen by nonrebreather mask.  Patient desaturates with minimal exertion.   CTA of the chest showed no evidence of pulmonary embolism.  CT scan showed right upper lobe pneumonia.  Received IV antibiotics during hospitalization including diuretics..  She was Rocephin and Zithromax..   Legionella and Urinary strep was negative.    Severe pulmonary hypertension  She was seen by audiology and palliative care during hospitalization.  Patient was considered to be a very poor candidate for ongoing treatment.    Macitentan stopped due to intolerance.  Cardiology suggested hospice level of care for   Hypothyroidism Continue Synthroid.   Essential hypertension Was on Toprol, Norvasc and lisinopril.  These medications were kept on hold due to persistently low blood pressure.  PVD, subclavian occlusion Continue aspirin, Plavix, and Zocor   Goals of care Patient was seen by palliative care.  Patient was not a good candidate for intubation.  Patient has overall poor prognosis.  This time family is wanting to initiate hospice level of care at home.  Disposition.  At this time, patient is stable for disposition to home with home hospice.  Spoke with the patient's daughter at bedside  Medical Consultants:   Cardiology Palliative care  Procedures:    None Subjective:   Today, patient was seen and examined at bedside.  Patient denies any pain, nausea, vomiting but he still requires high flow oxygen.  Discharge Exam:   Vitals:   11/28/20 0804 11/28/20 1200  BP: (!) 119/47 126/71  Pulse: 76 90  Resp: (!) 22 (!) 21  Temp:    SpO2: 93% 91%   Vitals:   11/28/20 0339 11/28/20 0755 11/28/20 0804 11/28/20 1200  BP: (!) 108/54 117/60 (!) 119/47 126/71  Pulse: 70 73 76 90  Resp: 20 20 (!) 22 (!) 21  Temp: 98.2 F (36.8 C) 98 F (36.7 C)    TempSrc: Oral Oral    SpO2: 96% 98% 93% 91%  Weight:      Height:       General:  Average built, alert awake and communicative. HENT:   No scleral pallor or icterus noted. Oral mucosa is moist.  On high flow nasal  cannula at 15 L/min. Chest: Diminished breath sounds bilaterally. CVS: S1 &S2 heard. No murmur.  Regular rate and rhythm. Abdomen: Soft, nontender, nondistended.  Bowel sounds are heard.   Extremities: No cyanosis, clubbing with trace edema. peripheral pulses are palpable. Psych: Alert, awake and oriented, normal mood CNS:  No cranial nerve deficits.  Power equal in all extremities.   Skin: Warm and dry.  No rashes noted.  The results of  significant diagnostics from this hospitalization (including imaging, microbiology, ancillary and laboratory) are listed below for reference.     Diagnostic Studies:   DG Chest 2 View  Result Date: 11/22/2020 CLINICAL DATA:  Shortness of breath EXAM: CHEST - 2 VIEW COMPARISON:  08/27/2020 FINDINGS: Cardiac shadow is within normal limits. Aortic calcifications are noted. No focal infiltrate or sizable effusion is seen. Chronic interstitial changes are noted in the bases. No bony abnormality is seen. IMPRESSION: No acute abnormality noted. Electronically Signed   By: Inez Catalina M.D.   On: 11/22/2020 23:19   CT Angio Chest PE W/Cm &/Or Wo Cm  Result Date: 11/23/2020 CLINICAL DATA:  80 year old female with shortness of breath. EXAM: CT ANGIOGRAPHY CHEST WITH CONTRAST TECHNIQUE: Multidetector CT imaging of the chest was performed using the standard protocol during bolus administration of intravenous contrast. Multiplanar CT image reconstructions and MIPs were obtained to evaluate the vascular anatomy. CONTRAST:  59m OMNIPAQUE IOHEXOL 350 MG/ML SOLN COMPARISON:  CT chest 08/30/2020, CTA chest 08/27/2020. Chest radiographs 11/22/2020. FINDINGS: Cardiovascular: Excellent contrast bolus timing in the pulmonary arterial tree. Mild respiratory motion. No focal filling defect identified in the pulmonary arteries to suggest acute pulmonary embolism. Extensive Calcified aortic atherosclerosis. Little aortic contrast today. No cardiomegaly or pericardial effusion. Calcified coronary artery atherosclerosis is extensive. Mediastinum/Nodes: Upper limits of normal to mildly enlarged mediastinal and hilar lymph nodes are stable since May. No mediastinal mass or progressive lymphadenopathy. Lungs/Pleura: Confluent and enhancing dependent atelectasis in both lungs. Major airways remain patent. Lower lung volumes compared to May with increased asymmetric peripheral and distal peribronchial interstitial opacity in the right  upper lobe (such as series 6, image 39). Underlying emphysema. No convincing consolidation. No pleural effusion. Upper Abdomen: Negative visible liver, gallbladder, spleen, pancreas, adrenal glands, kidneys and bowel in the upper abdomen. Musculoskeletal: Osteopenia. Mild chronic T10 and T11 compression fractures are stable since May. A mild to moderate L2 superior endplate compression fracture is stable since 2017. No acute osseous abnormality identified. Review of the MIP images confirms the above findings. IMPRESSION: 1. Negative for acute pulmonary embolus. 2. Emphysema (ICD10-J43.9) with lower lung volumes and dependent atelectasis. But new asymmetric right upper lobe interstitial opacity is suspicious for developing pneumonia. No pleural effusion. 3. Stable borderline to mildly enlarged mediastinal and bilateral hilar lymph nodes since May. 4. Calcified coronary artery and Aortic Atherosclerosis (ICD10-I70.0). Electronically Signed   By: HGenevie AnnM.D.   On: 11/23/2020 05:33     Labs:   Basic Metabolic Panel: Recent Labs  Lab 11/22/20 2300 11/24/20 0503 11/26/20 0411 11/27/20 0148 11/28/20 0339  NA 141 140 138 136 136  K 4.1 3.4* 3.7 3.6 4.1  CL 109 107 106 104 105  CO2 _0 GLUCOSE 139* 93 97 96 100*  BUN _1 CREATININE 0.75 0.81 0.79 0.88 0.92  CALCIUM 9.3 9.3 9.0 9.0 9.2  MG  --   --  2.2 2.3 2.3   GFR Estimated Creatinine Clearance: 39 mL/min (by C-G formula based  on SCr of 0.92 mg/dL). Liver Function Tests: Recent Labs  Lab 11/23/20 1300  AST 34  ALT 35  ALKPHOS 58  BILITOT 1.2  PROT 6.5  ALBUMIN 2.7*   No results for input(s): LIPASE, AMYLASE in the last 168 hours. No results for input(s): AMMONIA in the last 168 hours. Coagulation profile No results for input(s): INR, PROTIME in the last 168 hours.  CBC: Recent Labs  Lab 11/22/20 2300 11/24/20 0503 11/26/20 0411 11/27/20 0148 11/28/20 0339  WBC 15.5* 13.6* 13.0* 12.8* 11.5*  NEUTROABS  12.6*  --   --   --   --   HGB 11.5* 10.6* 11.0* 10.7* 10.9*  HCT 37.7 34.2* 34.2* 33.5* 34.3*  MCV 97.4 94.7 92.9 92.8 93.0  PLT 255 265 279 288 310   Cardiac Enzymes: No results for input(s): CKTOTAL, CKMB, CKMBINDEX, TROPONINI in the last 168 hours. BNP: Invalid input(s): POCBNP CBG: No results for input(s): GLUCAP in the last 168 hours. D-Dimer No results for input(s): DDIMER in the last 72 hours. Hgb A1c No results for input(s): HGBA1C in the last 72 hours. Lipid Profile No results for input(s): CHOL, HDL, LDLCALC, TRIG, CHOLHDL, LDLDIRECT in the last 72 hours. Thyroid function studies No results for input(s): TSH, T4TOTAL, T3FREE, THYROIDAB in the last 72 hours.  Invalid input(s): FREET3 Anemia work up No results for input(s): VITAMINB12, FOLATE, FERRITIN, TIBC, IRON, RETICCTPCT in the last 72 hours. Microbiology Recent Results (from the past 240 hour(s))  Resp Panel by RT-PCR (Flu A&B, Covid) Nasopharyngeal Swab     Status: None   Collection Time: 11/23/20 12:56 AM   Specimen: Nasopharyngeal Swab; Nasopharyngeal(NP) swabs in vial transport medium  Result Value Ref Range Status   SARS Coronavirus 2 by RT PCR NEGATIVE NEGATIVE Final    Comment: (NOTE) SARS-CoV-2 target nucleic acids are NOT DETECTED.  The SARS-CoV-2 RNA is generally detectable in upper respiratory specimens during the acute phase of infection. The lowest concentration of SARS-CoV-2 viral copies this assay can detect is 138 copies/mL. A negative result does not preclude SARS-Cov-2 infection and should not be used as the sole basis for treatment or other patient management decisions. A negative result may occur with  improper specimen collection/handling, submission of specimen other than nasopharyngeal swab, presence of viral mutation(s) within the areas targeted by this assay, and inadequate number of viral copies(<138 copies/mL). A negative result must be combined with clinical observations, patient  history, and epidemiological information. The expected result is Negative.  Fact Sheet for Patients:  EntrepreneurPulse.com.au  Fact Sheet for Healthcare Providers:  IncredibleEmployment.be  This test is no t yet approved or cleared by the [place] FDA and  has been authorized for detection and/or diagnosis of SARS-CoV-2 by FDA under an Emergency Use Authorization (EUA). This EUA will remain  in effect (meaning this test can be used) for the duration of the COVID-19 declaration under Section 564(b)(1) of the Act, 21 U.S.C.section 360bbb-3(b)(1), unless the authorization is terminated  or revoked sooner.       Influenza A by PCR NEGATIVE NEGATIVE Final   Influenza B by PCR NEGATIVE NEGATIVE Final    Comment: (NOTE) The Xpert Xpress SARS-CoV-2/FLU/RSV plus assay is intended as an aid in the diagnosis of influenza from Nasopharyngeal swab specimens and should not be used as a sole basis for treatment. Nasal washings and aspirates are unacceptable for Xpert Xpress SARS-CoV-2/FLU/RSV testing.  Fact Sheet for Patients: EntrepreneurPulse.com.au  Fact Sheet for Healthcare Providers: IncredibleEmployment.be  This test is not  yet approved or cleared by the [place] and has been authorized for detection and/or diagnosis of SARS-CoV-2 by FDA under an Emergency Use Authorization (EUA). This EUA will remain in effect (meaning this test can be used) for the duration of the COVID-19 declaration under Section 564(b)(1) of the Act, 21 U.S.C. section 360bbb-3(b)(1), unless the authorization is terminated or revoked.  Performed at [facility], [place] [address] [place], [place] [address]      Discharge Instructions:   Discharge Instructions     Diet - low sodium heart healthy   Complete by: As directed    Discharge instructions   Complete by: As directed    Hospice care provider to follow-up  with you.   Increase activity slowly   Complete by: As directed       Allergies as of 11/28/2020       Reactions   Meclizine Nausea And Vomiting        Medication List     STOP taking these medications    amLODipine 5 MG tablet Commonly known as: NORVASC   calcium-vitamin D 500-200 MG-UNIT tablet Commonly known as: OSCAL WITH D   lisinopril 20 MG tablet Commonly known as: ZESTRIL   macitentan 10 MG tablet Commonly known as: OPSUMIT   metoprolol succinate 25 MG 24 hr tablet Commonly known as: TOPROL-XL   predniSONE 5 MG tablet Commonly known as: DELTASONE       TAKE these medications    acetaminophen 325 MG tablet Commonly known as: TYLENOL Take 650 mg every 6 (six) hours as needed by mouth for mild pain or headache.   Advair HFA 115-21 MCG/ACT inhaler Generic drug: fluticasone-salmeterol INHALE 2 PUFFS INTO THE LUNGS TWICE A DAY What changed: See the new instructions.   albuterol (2.5 MG/3ML) 0.083% nebulizer solution Commonly known as: PROVENTIL Take 3 mLs (2.5 mg total) by nebulization every 6 (six) hours as needed for wheezing or shortness of breath.   albuterol 108 (90 Base) MCG/ACT inhaler Commonly known as: VENTOLIN HFA Inhale 2 puffs into the lungs every 6 (six) hours as needed for wheezing or shortness of breath.   Alive [employer] Tabs Take 1 tablet by mouth daily as needed (supplementation).   arformoterol 15 MCG/2ML Nebu Commonly known as: BROVANA Take 2 mLs (15 mcg total) by nebulization 2 (two) times daily.   aspirin 81 MG chewable tablet Chew 81 mg by mouth daily.   bisacodyl 5 MG EC tablet Commonly known as: DULCOLAX Take 1 tablet (5 mg total) by mouth daily as needed for moderate constipation.   budesonide 0.5 MG/2ML nebulizer solution Commonly known as: Pulmicort Take 2 mLs (0.5 mg total) by nebulization in the morning and at bedtime.   carbamide peroxide 6.5 % OTIC solution Commonly known as: DEBROX Place 5 drops into  both ears 2 (two) times daily for 5 days.   clopidogrel 75 MG tablet Commonly known as: PLAVIX TAKE 1 TABLET BY MOUTH EVERY DAY   diclofenac Sodium 1 % Gel Commonly known as: VOLTAREN Apply 2 g topically as needed (leg pain).   docusate sodium 100 MG capsule Commonly known as: COLACE TAKE 1 CAPSULE BY MOUTH EVERY DAY AS NEEDED FOR MILD CONSTIPATION   feeding supplement Liqd Take 237 mLs by mouth 2 (two) times daily between meals.   fluticasone 50 MCG/ACT nasal spray Commonly known as: FLONASE Place 1 spray into both nostrils daily as needed for allergies or rhinitis (for sinus congestion).   furosemide 20  MG tablet Commonly known as: LASIX 1 tab PO every other day What changed:  how much to take how to take this when to take this additional instructions   gabapentin 100 MG capsule Commonly known as: NEURONTIN TAKE 1 CAPSULE (100 MG TOTAL) BY MOUTH AT BEDTIME.   guaiFENesin-dextromethorphan 100-10 MG/5ML syrup Commonly known as: ROBITUSSIN DM Take 10 mLs by mouth every 4 (four) hours as needed for cough.   lansoprazole 15 MG capsule Commonly known as: PREVACID Take 1 capsule (15 mg total) by mouth daily.   latanoprost 0.005 % ophthalmic solution Commonly known as: XALATAN Place 1 drop at bedtime into both eyes.   levothyroxine 50 MCG tablet Commonly known as: SYNTHROID Take 1.5 tablets (75 mcg total) by mouth daily.   loratadine 10 MG tablet Commonly known as: CLARITIN Take 1 tablet (10 mg total) by mouth daily as needed for allergies.   LORazepam 2 MG/ML concentrated solution Commonly known as: LORazepam Intensol Place 0.3 mLs (0.6 mg total) under the tongue every 4 (four) hours as needed for anxiety.   morphine CONCENTRATE 10 MG/0.5ML Soln concentrated solution Place 0.25 mLs (5 mg total) under the tongue every hour as needed for shortness of breath.   potassium chloride SA 20 MEQ tablet Commonly known as: KLOR-CON Take 1 tablet (20 mEq total) by mouth  daily. What changed: how much to take   PROBIOTIC PO Take 1 capsule by mouth daily.   simvastatin 20 MG tablet Commonly known as: ZOCOR TAKE 1 TABLET BY MOUTH EVERYDAY AT BEDTIME What changed: See the new instructions.               Durable Medical Equipment  (From admission, onward)           Start     Ordered   11/26/20 1510  Heart failure home health orders  (Heart failure home health orders / Face to face)  Once       Comments: Heart Failure Follow-up Care:  Verify follow-up appointments per Patient Discharge Instructions. Confirm transportation arranged. Reconcile home medications with discharge medication list. Remove discontinued medications from use. Assist patient/caregiver to manage medications using pill box. Reinforce low sodium food selection Assessments: Vital signs and oxygen saturation at each visit. Assess home environment for safety concerns, caregiver support and availability of low-sodium foods. Consult Education officer, museum, PT/OT, Dietitian, and CNA based on assessments. Perform comprehensive cardiopulmonary assessment. Notify MD for any change in condition or weight gain of 3 pounds in one day or 5 pounds in one week with symptoms. Daily Weights and Symptom Monitoring: Ensure patient has access to scales. Teach patient/caregiver to weigh daily before breakfast and after voiding using same scale and record.    Teach patient/caregiver to track weight and symptoms and when to notify Provider. Activity: Develop individualized activity plan with patient/caregiver.   Question Answer Comment  Heart Failure Follow-up Care Advanced [facility] ([facility] at [facility] Visits Set up telemonitoring equipment to monitor daily vital signs, weights and oxygen saturation   Obtain the following labs Basic Metabolic Panel   Lab frequency Weekly   Fax lab results to AHF Clinic at [phone]   Diet Low Sodium Heart Healthy   Fluid restrictions:  1500 mL Fluid      11/26/20 1512            Follow-up Information     [facility] Follow up.   Specialty: Hospice and Palliative Medicine Why: Home Hospice RN will call to  arrange initial visit Contact information: [address]                 Time coordinating discharge: 39 minutes  Signed:  Yadir Zentner  [facility] 11/28/2020, 1:03 PM

## 2020-11-28 NOTE — TOC CM/SW Note (Signed)
HF TOC CM spoke to dtr, Arbie Cookey and explained DME will need delivery tonight. Family was wanting to push back until Monday, they did not get clear info about when pt was ready for dc. Explained pt had dc in to home for today with Home Hospice. [facility] has arranged delivery with [facility] for today. Will have Unit RN call PTAR once pt's family has received DME. Family to pick up meds from [facility] on [address] prior to pt coming home. [place], Heart Failure TOC CM [phone]

## 2020-11-28 NOTE — Progress Notes (Addendum)
Spoke w/[facility] concerning pt equipment delivery. Daughter is concerned about equipment not being delivered by 2030. I spoke w/dispatch and should be receiving a call shortly.    2120 Spoke w/Carinah B from [facility] and she stated that equipment will be delivered on the 8/20 and 8/22 per family request. I updated daughter Lorenza Evangelist who is at bedside. She is okay/agrees w/curent plan. She stated that she would update other family. Night RN is updated and PTAR canceled for tonight.

## 2020-11-28 NOTE — Progress Notes (Signed)
Manufacturing engineer [facility])  Hospital Liaison: RN note      [facility] hospital liaison followed up the [facility] and the DME should be delivered by 830 this evening. Daughter, [name] is aware. The number to call RN was given to [place] as well. If you have not heard from her by 830 pm you want want to call her to verify delivery.   Farrel Gordon, RN, [facility] Hospital Liaison   [phone]
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
## 2020-11-28 NOTE — Plan of Care (Signed)

## 2020-11-28 NOTE — Progress Notes (Signed)
Manufacturing engineer [facility]) Hospital Liaison: RN note    Notified by Transition of Care Manger of patient/family request for [facility] services at home after discharge. Chart and patient information under review by [facility] physician.  Writer spoke with daughter, [name] to initiate education related to hospice philosophy, services and team approach to care.  [name] verbalized understanding of information given. Per discussion, plan is for discharge to home by [facility].    Please send signed and completed DNR form home with patient/family. Patient will need prescriptions for discharge comfort medications.     DME needs have been discussed, patient currently has the following equipment in the home: oxygen.  Patient/family requests the following DME for delivery to the home: hospital bed and additional oxygen concentrator.  Power equipment manager has been notified and will contact DME provider to arrange delivery to the home. Home address has been verified and is correct in the chart.  [name] is the family member to contact to arrange time of delivery.     [facility] aware of the above. Please notify [facility] when patient is ready to leave the unit at discharge. (Call [phone] or [phone] after 5pm.) [facility] information and contact numbers given to [facility].       A Please do not hesitate to call with questions.    Thank you,   Farrel Gordon, RN, [facility] Liaison   [phone]

## 2020-11-29 NOTE — Care Management (Addendum)
09:30 LVM for daughter [name] at home number, mobile number VM was full. Requested callback.  [place],Carroll Daughter [phone]  [phone]  [place],[name] Daughter [phone]  [phone]  10:30 Spoke w [facility] liaison who states that DME has been delivered and that RN will see the patient at home tomorrow at 10am.  11:00 Met with [name] at bedside. She confirms that all DME has been delivered to the home, she confirms address in [place] for [facility]. Nurse states that she has called [facility]. No other CM needs identified.

## 2020-11-29 NOTE — Progress Notes (Signed)
Daily Progress Note   Patient Name: [name]       Date: 11/29/2020 DOB: 12-Jan-1941  Age: 80 y.o. MRN#: [id number] Attending Physician: Flora Lipps, MD Primary Care Physician: [name], MD Admit Date: 11/22/2020  Reason for Consultation/Follow-up: Establishing goals of care  Patient Profile/HPI: 80 y.o. female  with past medical history of COPD, PAH, hypothyroidism, hypertension admitted on 11/22/2020 with acute on chronic respiratory failure secondary to possible pnuemonia in the setting of advanced PAH and COPD. Currently on 20L HFNC. No longer candidate for advanced medications for PAH. Diuresing well on lasix. Completing antibiotic course on 8/19. Palliative medicine consulted for goals of care discussion.  Subjective: [name] is awake and alert. She feels well and is ready to go home. [name] is at bedside. Their home DME has been delivered and they are eager to get home.  I reviewed use of morphine prn for SOB and also as a premed before any exertional activity such as bathing. Also reviewed side effects and encouraged use of a stool softener.   Review of Systems  Constitutional:  Negative for weight loss.  Respiratory:  Negative for cough and shortness of breath.     Physical Exam Vitals and nursing note reviewed.  Constitutional:      Appearance: Normal appearance.  Cardiovascular:     Rate and Rhythm: Normal rate and regular rhythm.  Pulmonary:     Effort: Pulmonary effort is normal.  Neurological:     Mental Status: She is alert and oriented to person, place, and time. Mental status is at baseline.            Vital Signs: BP (!) 129/59 (BP Location: Left Arm)   Pulse 77   Temp 98.6 F (37 C) (Oral)   Resp (!) 21   Ht _0  (1.6 m)   Wt 53.5 kg    SpO2 96%   BMI 20.89 kg/m  SpO2: SpO2: 96 % O2 Device: O2 Device: High Flow Nasal Cannula O2 Flow Rate: O2 Flow Rate (L/min): 15 L/min  Intake/output summary:  Intake/Output Summary (Last 24 hours) at 11/29/2020 0945 Last data filed at 11/29/2020 0000 Gross per 24 hour  Intake 240 ml  Output 650 ml  Net -410 ml   LBM: Last BM Date: 11/27/20 Baseline Weight: Weight: 56.2 kg Most  recent weight: Weight: 53.5 kg       Palliative Assessment/Data: PPS: 30%      Patient Active Problem List   Diagnosis Date Noted   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   COPD with acute exacerbation ([place]) 08/27/2020   COVID-19 05/01/2020   Pulmonary hypertension ([place]) 01/15/2020   Hypoxemic respiratory failure, chronic ([place]) 01/15/2020   Lung nodule, multiple 01/15/2020   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   SOB (shortness of breath) 12/26/2019   Acute on chronic respiratory failure with hypoxia ([place]) 12/25/2019   Acute respiratory failure with hypoxemia ([place]) 12/25/2019   Essential hypertension 06/06/2017   Hyperlipidemia 06/06/2017   Leg cramps 06/06/2017   Vertigo 06/06/2017   Macular degeneration 06/06/2017   Hypothyroidism 06/06/2017   Carotid stenosis 01/22/2014   Aftercare following surgery of the circulatory system, NEC 01/02/2013   Occlusion and stenosis of carotid artery without mention of cerebral infarction 04/27/2011    Palliative Care Assessment & Plan    Assessment/Recommendations/Plan  Plan for discharge home with Hospice today Symptom management reviewed   Code Status: DNR  Prognosis:  < 6 weeks  Discharge Planning: Home with Hospice  Care plan was discussed with patient, her sister [name] and her RN Stanton Kidney.  Thank you for allowing the Palliative Medicine Team to assist in the care of this patient.  Total time: 28 mins Prolonged billing: N     Greater than 50%  of this time was spent counseling and coordinating care related to the above assessment  and plan.  Mariana Kaufman, AGNP-C Palliative Medicine   Please contact Palliative Medicine Team phone at [phone] for questions and concerns.

## 2020-11-29 NOTE — Progress Notes (Signed)
Patient seen and examined at bedside.  No interval changes.  Awaiting for disposition home with hospice set up.  Refer to discharge instructions made on 11/28/2020.  Stable for disposition home.

## 2020-11-30 DIAGNOSIS — R918 Other nonspecific abnormal finding of lung field: Secondary | ICD-10-CM | POA: Diagnosis not present

## 2020-11-30 DIAGNOSIS — J302 Other seasonal allergic rhinitis: Secondary | ICD-10-CM | POA: Diagnosis not present

## 2020-11-30 DIAGNOSIS — R42 Dizziness and giddiness: Secondary | ICD-10-CM | POA: Diagnosis not present

## 2020-11-30 DIAGNOSIS — Z6820 Body mass index (BMI) 20.0-20.9, adult: Secondary | ICD-10-CM | POA: Diagnosis not present

## 2020-11-30 DIAGNOSIS — H409 Unspecified glaucoma: Secondary | ICD-10-CM | POA: Diagnosis not present

## 2020-11-30 DIAGNOSIS — I959 Hypotension, unspecified: Secondary | ICD-10-CM | POA: Diagnosis not present

## 2020-11-30 DIAGNOSIS — E43 Unspecified severe protein-calorie malnutrition: Secondary | ICD-10-CM | POA: Diagnosis not present

## 2020-11-30 DIAGNOSIS — J181 Lobar pneumonia, unspecified organism: Secondary | ICD-10-CM | POA: Diagnosis not present

## 2020-11-30 DIAGNOSIS — K219 Gastro-esophageal reflux disease without esophagitis: Secondary | ICD-10-CM | POA: Diagnosis not present

## 2020-11-30 DIAGNOSIS — I272 Pulmonary hypertension, unspecified: Secondary | ICD-10-CM | POA: Diagnosis not present

## 2020-11-30 DIAGNOSIS — I82B11 Acute embolism and thrombosis of right subclavian vein: Secondary | ICD-10-CM | POA: Diagnosis not present

## 2020-11-30 DIAGNOSIS — E785 Hyperlipidemia, unspecified: Secondary | ICD-10-CM | POA: Diagnosis not present

## 2020-11-30 DIAGNOSIS — I739 Peripheral vascular disease, unspecified: Secondary | ICD-10-CM | POA: Diagnosis not present

## 2020-11-30 DIAGNOSIS — I700 Atherosclerosis of aorta: Secondary | ICD-10-CM | POA: Diagnosis not present

## 2020-11-30 DIAGNOSIS — J9621 Acute and chronic respiratory failure with hypoxia: Secondary | ICD-10-CM | POA: Diagnosis not present

## 2020-11-30 DIAGNOSIS — Z8679 Personal history of other diseases of the circulatory system: Secondary | ICD-10-CM | POA: Diagnosis not present

## 2020-11-30 DIAGNOSIS — Z8616 Personal history of COVID-19: Secondary | ICD-10-CM | POA: Diagnosis not present

## 2020-11-30 DIAGNOSIS — J439 Emphysema, unspecified: Secondary | ICD-10-CM | POA: Diagnosis not present

## 2020-11-30 DIAGNOSIS — I2584 Coronary atherosclerosis due to calcified coronary lesion: Secondary | ICD-10-CM | POA: Diagnosis not present

## 2020-11-30 DIAGNOSIS — E039 Hypothyroidism, unspecified: Secondary | ICD-10-CM | POA: Diagnosis not present

## 2020-12-01 ENCOUNTER — Encounter (HOSPITAL_COMMUNITY): Payer: Self-pay | Admitting: *Deleted

## 2020-12-01 ENCOUNTER — Telehealth: Payer: Self-pay | Admitting: *Deleted

## 2020-12-01 DIAGNOSIS — E43 Unspecified severe protein-calorie malnutrition: Secondary | ICD-10-CM | POA: Diagnosis not present

## 2020-12-01 DIAGNOSIS — Z6820 Body mass index (BMI) 20.0-20.9, adult: Secondary | ICD-10-CM | POA: Diagnosis not present

## 2020-12-01 DIAGNOSIS — J439 Emphysema, unspecified: Secondary | ICD-10-CM | POA: Diagnosis not present

## 2020-12-01 DIAGNOSIS — J9621 Acute and chronic respiratory failure with hypoxia: Secondary | ICD-10-CM | POA: Diagnosis not present

## 2020-12-01 DIAGNOSIS — I272 Pulmonary hypertension, unspecified: Secondary | ICD-10-CM | POA: Diagnosis not present

## 2020-12-01 DIAGNOSIS — J181 Lobar pneumonia, unspecified organism: Secondary | ICD-10-CM | POA: Diagnosis not present

## 2020-12-01 NOTE — Telephone Encounter (Signed)
Copied from [place] [id number]. Topic: General - Other >> Nov 28, 2020 12:12 PM Oneta Rack wrote: Caller name: Tammy Relation to pt: [place]  Call back number: [phone]    Reason for call: Caller would like to know if PCP would be patient attending record physician? Also a nurse will visit patient patient at her home for admission visit on  11/30/2020.

## 2020-12-01 NOTE — Progress Notes (Signed)
Pt's daughter, Jacinto Halim, request FMLA for intermittent leave to care for her mother and bring her to appts. Forms completed and signed by Dr Aundra Dubin, [name] picked up copy from front desk

## 2020-12-03 DIAGNOSIS — J439 Emphysema, unspecified: Secondary | ICD-10-CM | POA: Diagnosis not present

## 2020-12-03 DIAGNOSIS — E43 Unspecified severe protein-calorie malnutrition: Secondary | ICD-10-CM | POA: Diagnosis not present

## 2020-12-03 DIAGNOSIS — Z6820 Body mass index (BMI) 20.0-20.9, adult: Secondary | ICD-10-CM | POA: Diagnosis not present

## 2020-12-03 DIAGNOSIS — I272 Pulmonary hypertension, unspecified: Secondary | ICD-10-CM | POA: Diagnosis not present

## 2020-12-03 DIAGNOSIS — J181 Lobar pneumonia, unspecified organism: Secondary | ICD-10-CM | POA: Diagnosis not present

## 2020-12-03 DIAGNOSIS — J9621 Acute and chronic respiratory failure with hypoxia: Secondary | ICD-10-CM | POA: Diagnosis not present

## 2020-12-03 NOTE — Telephone Encounter (Signed)
Returned Tammy call and left a detailed vm making her aware that Dr. Wynetta Emery will be the physician and if she has any questions or concerns to give [place] a call

## 2020-12-04 DIAGNOSIS — J181 Lobar pneumonia, unspecified organism: Secondary | ICD-10-CM | POA: Diagnosis not present

## 2020-12-04 DIAGNOSIS — I272 Pulmonary hypertension, unspecified: Secondary | ICD-10-CM | POA: Diagnosis not present

## 2020-12-04 DIAGNOSIS — J9621 Acute and chronic respiratory failure with hypoxia: Secondary | ICD-10-CM | POA: Diagnosis not present

## 2020-12-04 DIAGNOSIS — J439 Emphysema, unspecified: Secondary | ICD-10-CM | POA: Diagnosis not present

## 2020-12-04 DIAGNOSIS — E43 Unspecified severe protein-calorie malnutrition: Secondary | ICD-10-CM | POA: Diagnosis not present

## 2020-12-04 DIAGNOSIS — Z6820 Body mass index (BMI) 20.0-20.9, adult: Secondary | ICD-10-CM | POA: Diagnosis not present

## 2020-12-05 ENCOUNTER — Telehealth: Payer: Self-pay | Admitting: Internal Medicine

## 2020-12-05 DIAGNOSIS — Z6820 Body mass index (BMI) 20.0-20.9, adult: Secondary | ICD-10-CM | POA: Diagnosis not present

## 2020-12-05 DIAGNOSIS — E43 Unspecified severe protein-calorie malnutrition: Secondary | ICD-10-CM | POA: Diagnosis not present

## 2020-12-05 DIAGNOSIS — I272 Pulmonary hypertension, unspecified: Secondary | ICD-10-CM | POA: Diagnosis not present

## 2020-12-05 DIAGNOSIS — J9621 Acute and chronic respiratory failure with hypoxia: Secondary | ICD-10-CM | POA: Diagnosis not present

## 2020-12-05 DIAGNOSIS — J181 Lobar pneumonia, unspecified organism: Secondary | ICD-10-CM | POA: Diagnosis not present

## 2020-12-05 DIAGNOSIS — J439 Emphysema, unspecified: Secondary | ICD-10-CM | POA: Diagnosis not present

## 2020-12-05 NOTE — Telephone Encounter (Signed)
Please advise otherwise.   Nurse from hospice was asking if patient should continue prednisone prescription.  Informed  RN that Rx was from [name].

## 2020-12-05 NOTE — Telephone Encounter (Signed)
Copied from [place] [id number]. Topic: General - Inquiry >> Dec 05, 2020  1:27 PM Valere Dross wrote: Reason for CRM: Izora Gala from [facility] called in on behalf of the pt about medication predniSONE (DELTASONE) 5 MG tablet  and if she should still be taking the medication. Please advise

## 2020-12-08 DIAGNOSIS — J181 Lobar pneumonia, unspecified organism: Secondary | ICD-10-CM | POA: Diagnosis not present

## 2020-12-08 DIAGNOSIS — J9621 Acute and chronic respiratory failure with hypoxia: Secondary | ICD-10-CM | POA: Diagnosis not present

## 2020-12-08 DIAGNOSIS — Z6820 Body mass index (BMI) 20.0-20.9, adult: Secondary | ICD-10-CM | POA: Diagnosis not present

## 2020-12-08 DIAGNOSIS — J439 Emphysema, unspecified: Secondary | ICD-10-CM | POA: Diagnosis not present

## 2020-12-08 DIAGNOSIS — E43 Unspecified severe protein-calorie malnutrition: Secondary | ICD-10-CM | POA: Diagnosis not present

## 2020-12-08 DIAGNOSIS — I272 Pulmonary hypertension, unspecified: Secondary | ICD-10-CM | POA: Diagnosis not present

## 2020-12-09 NOTE — Telephone Encounter (Signed)
Left message on voicemail for [facility] regarding medication- Prednisone. Advise to return call for further questions.

## 2020-12-10 ENCOUNTER — Telehealth: Payer: Self-pay | Admitting: Internal Medicine

## 2020-12-10 DIAGNOSIS — J9621 Acute and chronic respiratory failure with hypoxia: Secondary | ICD-10-CM | POA: Diagnosis not present

## 2020-12-10 DIAGNOSIS — J181 Lobar pneumonia, unspecified organism: Secondary | ICD-10-CM | POA: Diagnosis not present

## 2020-12-10 DIAGNOSIS — E43 Unspecified severe protein-calorie malnutrition: Secondary | ICD-10-CM | POA: Diagnosis not present

## 2020-12-10 DIAGNOSIS — J439 Emphysema, unspecified: Secondary | ICD-10-CM | POA: Diagnosis not present

## 2020-12-10 DIAGNOSIS — Z6820 Body mass index (BMI) 20.0-20.9, adult: Secondary | ICD-10-CM | POA: Diagnosis not present

## 2020-12-10 DIAGNOSIS — I272 Pulmonary hypertension, unspecified: Secondary | ICD-10-CM | POA: Diagnosis not present

## 2020-12-10 NOTE — Telephone Encounter (Signed)
PC placed to pt's daughter [name] today.  I infromed her that I still has this FMLA form that she sent in for me to complete.  I wanted to know if she still need me to complete this.  She stated that now that her mother is on hospice and will not be going to appointments, she no longer needs me to fill out the FMLA.  She tells me they have excepted the fact that her mother is not going to get any better and they are just wanting to keep her comfortable.  I told her that I agree with that.

## 2020-12-11 DIAGNOSIS — I700 Atherosclerosis of aorta: Secondary | ICD-10-CM | POA: Diagnosis not present

## 2020-12-11 DIAGNOSIS — I2584 Coronary atherosclerosis due to calcified coronary lesion: Secondary | ICD-10-CM | POA: Diagnosis not present

## 2020-12-11 DIAGNOSIS — I82B11 Acute embolism and thrombosis of right subclavian vein: Secondary | ICD-10-CM | POA: Diagnosis not present

## 2020-12-11 DIAGNOSIS — E43 Unspecified severe protein-calorie malnutrition: Secondary | ICD-10-CM | POA: Diagnosis not present

## 2020-12-11 DIAGNOSIS — J181 Lobar pneumonia, unspecified organism: Secondary | ICD-10-CM | POA: Diagnosis not present

## 2020-12-11 DIAGNOSIS — I739 Peripheral vascular disease, unspecified: Secondary | ICD-10-CM | POA: Diagnosis not present

## 2020-12-11 DIAGNOSIS — I959 Hypotension, unspecified: Secondary | ICD-10-CM | POA: Diagnosis not present

## 2020-12-11 DIAGNOSIS — E785 Hyperlipidemia, unspecified: Secondary | ICD-10-CM | POA: Diagnosis not present

## 2020-12-11 DIAGNOSIS — E039 Hypothyroidism, unspecified: Secondary | ICD-10-CM | POA: Diagnosis not present

## 2020-12-11 DIAGNOSIS — K219 Gastro-esophageal reflux disease without esophagitis: Secondary | ICD-10-CM | POA: Diagnosis not present

## 2020-12-11 DIAGNOSIS — Z8679 Personal history of other diseases of the circulatory system: Secondary | ICD-10-CM | POA: Diagnosis not present

## 2020-12-11 DIAGNOSIS — R42 Dizziness and giddiness: Secondary | ICD-10-CM | POA: Diagnosis not present

## 2020-12-11 DIAGNOSIS — J9621 Acute and chronic respiratory failure with hypoxia: Secondary | ICD-10-CM | POA: Diagnosis not present

## 2020-12-11 DIAGNOSIS — J302 Other seasonal allergic rhinitis: Secondary | ICD-10-CM | POA: Diagnosis not present

## 2020-12-11 DIAGNOSIS — Z8616 Personal history of COVID-19: Secondary | ICD-10-CM | POA: Diagnosis not present

## 2020-12-11 DIAGNOSIS — I272 Pulmonary hypertension, unspecified: Secondary | ICD-10-CM | POA: Diagnosis not present

## 2020-12-11 DIAGNOSIS — J439 Emphysema, unspecified: Secondary | ICD-10-CM | POA: Diagnosis not present

## 2020-12-11 DIAGNOSIS — R918 Other nonspecific abnormal finding of lung field: Secondary | ICD-10-CM | POA: Diagnosis not present

## 2020-12-11 DIAGNOSIS — H409 Unspecified glaucoma: Secondary | ICD-10-CM | POA: Diagnosis not present

## 2020-12-11 DIAGNOSIS — Z6820 Body mass index (BMI) 20.0-20.9, adult: Secondary | ICD-10-CM | POA: Diagnosis not present

## 2020-12-12 ENCOUNTER — Encounter (HOSPITAL_COMMUNITY): Payer: Self-pay | Admitting: Internal Medicine

## 2020-12-12 ENCOUNTER — Other Ambulatory Visit: Payer: Self-pay

## 2020-12-12 ENCOUNTER — Emergency Department (HOSPITAL_COMMUNITY)
Admission: EM | Admit: 2020-12-12 | Discharge: 2021-01-10 | Disposition: E | Attending: Emergency Medicine | Admitting: Emergency Medicine

## 2020-12-12 ENCOUNTER — Emergency Department (HOSPITAL_COMMUNITY)

## 2020-12-12 ENCOUNTER — Telehealth: Payer: Self-pay | Admitting: Internal Medicine

## 2020-12-12 DIAGNOSIS — Z7902 Long term (current) use of antithrombotics/antiplatelets: Secondary | ICD-10-CM | POA: Insufficient documentation

## 2020-12-12 DIAGNOSIS — Z7982 Long term (current) use of aspirin: Secondary | ICD-10-CM | POA: Insufficient documentation

## 2020-12-12 DIAGNOSIS — Z7951 Long term (current) use of inhaled steroids: Secondary | ICD-10-CM | POA: Insufficient documentation

## 2020-12-12 DIAGNOSIS — J9611 Chronic respiratory failure with hypoxia: Secondary | ICD-10-CM | POA: Insufficient documentation

## 2020-12-12 DIAGNOSIS — R0689 Other abnormalities of breathing: Secondary | ICD-10-CM | POA: Diagnosis not present

## 2020-12-12 DIAGNOSIS — R0902 Hypoxemia: Secondary | ICD-10-CM | POA: Diagnosis not present

## 2020-12-12 DIAGNOSIS — J441 Chronic obstructive pulmonary disease with (acute) exacerbation: Secondary | ICD-10-CM | POA: Diagnosis not present

## 2020-12-12 DIAGNOSIS — Z8616 Personal history of COVID-19: Secondary | ICD-10-CM | POA: Insufficient documentation

## 2020-12-12 DIAGNOSIS — J9621 Acute and chronic respiratory failure with hypoxia: Secondary | ICD-10-CM | POA: Diagnosis not present

## 2020-12-12 DIAGNOSIS — E43 Unspecified severe protein-calorie malnutrition: Secondary | ICD-10-CM | POA: Diagnosis not present

## 2020-12-12 DIAGNOSIS — Z87891 Personal history of nicotine dependence: Secondary | ICD-10-CM | POA: Diagnosis not present

## 2020-12-12 DIAGNOSIS — E039 Hypothyroidism, unspecified: Secondary | ICD-10-CM | POA: Insufficient documentation

## 2020-12-12 DIAGNOSIS — R404 Transient alteration of awareness: Secondary | ICD-10-CM | POA: Diagnosis not present

## 2020-12-12 DIAGNOSIS — Z515 Encounter for palliative care: Secondary | ICD-10-CM

## 2020-12-12 DIAGNOSIS — J9601 Acute respiratory failure with hypoxia: Secondary | ICD-10-CM | POA: Diagnosis not present

## 2020-12-12 DIAGNOSIS — I272 Pulmonary hypertension, unspecified: Secondary | ICD-10-CM | POA: Insufficient documentation

## 2020-12-12 DIAGNOSIS — J9600 Acute respiratory failure, unspecified whether with hypoxia or hypercapnia: Secondary | ICD-10-CM | POA: Diagnosis not present

## 2020-12-12 DIAGNOSIS — R0602 Shortness of breath: Secondary | ICD-10-CM | POA: Diagnosis not present

## 2020-12-12 DIAGNOSIS — Z79899 Other long term (current) drug therapy: Secondary | ICD-10-CM | POA: Diagnosis not present

## 2020-12-12 DIAGNOSIS — Z20822 Contact with and (suspected) exposure to covid-19: Secondary | ICD-10-CM | POA: Insufficient documentation

## 2020-12-12 DIAGNOSIS — R092 Respiratory arrest: Secondary | ICD-10-CM | POA: Diagnosis not present

## 2020-12-12 DIAGNOSIS — J439 Emphysema, unspecified: Secondary | ICD-10-CM | POA: Diagnosis not present

## 2020-12-12 DIAGNOSIS — J9811 Atelectasis: Secondary | ICD-10-CM | POA: Diagnosis not present

## 2020-12-12 DIAGNOSIS — Z6820 Body mass index (BMI) 20.0-20.9, adult: Secondary | ICD-10-CM | POA: Diagnosis not present

## 2020-12-12 DIAGNOSIS — J181 Lobar pneumonia, unspecified organism: Secondary | ICD-10-CM | POA: Diagnosis not present

## 2020-12-12 DIAGNOSIS — I10 Essential (primary) hypertension: Secondary | ICD-10-CM | POA: Insufficient documentation

## 2020-12-12 LAB — CBC WITH DIFFERENTIAL/PLATELET
Abs Immature Granulocytes: 0 10*3/uL (ref 0.00–0.07)
Basophils Absolute: 0 10*3/uL (ref 0.0–0.1)
Basophils Relative: 0 %
Eosinophils Absolute: 0 10*3/uL (ref 0.0–0.5)
Eosinophils Relative: 0 %
HCT: 40.1 % (ref 36.0–46.0)
Hemoglobin: 11.3 g/dL — ABNORMAL LOW (ref 12.0–15.0)
Lymphocytes Relative: 50 %
Lymphs Abs: 9.2 10*3/uL — ABNORMAL HIGH (ref 0.7–4.0)
MCH: 29.7 pg (ref 26.0–34.0)
MCHC: 28.2 g/dL — ABNORMAL LOW (ref 30.0–36.0)
MCV: 105.5 fL — ABNORMAL HIGH (ref 80.0–100.0)
Monocytes Absolute: 1.3 10*3/uL — ABNORMAL HIGH (ref 0.1–1.0)
Monocytes Relative: 7 %
Neutro Abs: 7.9 10*3/uL — ABNORMAL HIGH (ref 1.7–7.7)
Neutrophils Relative %: 43 %
Platelets: 239 10*3/uL (ref 150–400)
RBC: 3.8 MIL/uL — ABNORMAL LOW (ref 3.87–5.11)
RDW: 15.1 % (ref 11.5–15.5)
WBC: 18.3 10*3/uL — ABNORMAL HIGH (ref 4.0–10.5)
nRBC: 0 % (ref 0.0–0.2)
nRBC: 0 /100 WBC

## 2020-12-12 LAB — LACTIC ACID, PLASMA
Lactic Acid, Venous: >11 mmol/L (ref 0.5–1.9)
Lactic Acid, Venous: 8 mmol/L (ref 0.5–1.9)

## 2020-12-12 LAB — COMPREHENSIVE METABOLIC PANEL
ALT: 19 U/L (ref 0–44)
AST: 39 U/L (ref 15–41)
Albumin: 2.7 g/dL — ABNORMAL LOW (ref 3.5–5.0)
Alkaline Phosphatase: 49 U/L (ref 38–126)
Anion gap: 19 — ABNORMAL HIGH (ref 5–15)
BUN: 9 mg/dL (ref 8–23)
CO2: 11 mmol/L — ABNORMAL LOW (ref 22–32)
Calcium: 9.2 mg/dL (ref 8.9–10.3)
Chloride: 111 mmol/L (ref 98–111)
Creatinine, Ser: 1.2 mg/dL — ABNORMAL HIGH (ref 0.44–1.00)
GFR, Estimated: 45 mL/min — ABNORMAL LOW (ref >60)
Glucose, Bld: 358 mg/dL — ABNORMAL HIGH (ref 70–99)
Potassium: 4.2 mmol/L (ref 3.5–5.1)
Sodium: 141 mmol/L (ref 135–145)
Total Bilirubin: 0.4 mg/dL (ref 0.3–1.2)
Total Protein: 6 g/dL — ABNORMAL LOW (ref 6.5–8.1)

## 2020-12-12 LAB — I-STAT ARTERIAL BLOOD GAS, ED
Acid-base deficit: 24 mmol/L — ABNORMAL HIGH (ref 0.0–2.0)
Bicarbonate: 8.3 mmol/L — ABNORMAL LOW (ref 20.0–28.0)
Calcium, Ion: 1.02 mmol/L — ABNORMAL LOW (ref 1.15–1.40)
HCT: 24 % — ABNORMAL LOW (ref 36.0–46.0)
Hemoglobin: 8.2 g/dL — ABNORMAL LOW (ref 12.0–15.0)
O2 Saturation: 73 %
Patient temperature: 96.8
Potassium: 3.2 mmol/L — ABNORMAL LOW (ref 3.5–5.1)
Sodium: 106 mmol/L — CL (ref 135–145)
TCO2: 10 mmol/L — ABNORMAL LOW (ref 22–32)
pCO2 arterial: 43.5 mmHg (ref 32.0–48.0)
pH, Arterial: 6.879 — CL (ref 7.350–7.450)
pO2, Arterial: 61 mmHg — ABNORMAL LOW (ref 83.0–108.0)

## 2020-12-12 LAB — RESP PANEL BY RT-PCR (FLU A&B, COVID) ARPGX2
Influenza A by PCR: NEGATIVE
Influenza B by PCR: NEGATIVE
SARS Coronavirus 2 by RT PCR: NEGATIVE

## 2020-12-12 LAB — TROPONIN I (HIGH SENSITIVITY)
Troponin I (High Sensitivity): 31 ng/L — ABNORMAL HIGH (ref <18)
Troponin I (High Sensitivity): 40 ng/L — ABNORMAL HIGH (ref <18)

## 2020-12-12 LAB — BRAIN NATRIURETIC PEPTIDE: B Natriuretic Peptide: 870.5 pg/mL — ABNORMAL HIGH (ref 0.0–100.0)

## 2020-12-12 LAB — D-DIMER, QUANTITATIVE: D-Dimer, Quant: 3.71 ug/mL-FEU — ABNORMAL HIGH (ref 0.00–0.50)

## 2020-12-12 MED ORDER — LACTATED RINGERS IV BOLUS
30.0000 mL/kg | Freq: Once | INTRAVENOUS | Status: AC
  Administered 2020-12-12: 1605 mL via INTRAVENOUS

## 2020-12-12 MED ORDER — ONDANSETRON 4 MG PO TBDP: 4.0000 mg | ORAL_TABLET | Freq: Four times a day (QID) | ORAL | Status: DC | PRN

## 2020-12-12 MED ORDER — ETOMIDATE 2 MG/ML IV SOLN
20.0000 mg | Freq: Once | INTRAVENOUS | Status: AC
  Administered 2020-12-12: 20 mg via INTRAVENOUS

## 2020-12-12 MED ORDER — FENTANYL CITRATE PF 50 MCG/ML IJ SOSY
50.0000 ug | PREFILLED_SYRINGE | INTRAMUSCULAR | Status: DC | PRN
  Administered 2020-12-12 (×2): 50 ug via INTRAVENOUS
  Filled 2020-12-12 (×2): qty 1

## 2020-12-12 MED ORDER — GLYCOPYRROLATE 0.2 MG/ML IJ SOLN: 0.2000 mg | INTRAMUSCULAR | Status: DC | PRN

## 2020-12-12 MED ORDER — SODIUM CHLORIDE 0.9 % IV SOLN
2.0000 g | Freq: Once | INTRAVENOUS | Status: AC
  Administered 2020-12-12: 2 g via INTRAVENOUS

## 2020-12-12 MED ORDER — ACETAMINOPHEN 325 MG PO TABS: 650.0000 mg | ORAL_TABLET | Freq: Four times a day (QID) | ORAL | Status: DC | PRN

## 2020-12-12 MED ORDER — ONDANSETRON HCL 4 MG/2ML IJ SOLN: 4.0000 mg | Freq: Four times a day (QID) | INTRAMUSCULAR | Status: DC | PRN

## 2020-12-12 MED ORDER — VANCOMYCIN HCL IN DEXTROSE 1-5 GM/200ML-% IV SOLN
1000.0000 mg | Freq: Once | INTRAVENOUS | Status: AC
  Administered 2020-12-12: 1000 mg via INTRAVENOUS
  Filled 2020-12-12: qty 200

## 2020-12-12 MED ORDER — FENTANYL BOLUS VIA INFUSION
25.0000 ug | INTRAVENOUS | Status: DC | PRN
  Filled 2020-12-12: qty 100

## 2020-12-12 MED ORDER — ROCURONIUM BROMIDE 50 MG/5ML IV SOLN
60.0000 mg | Freq: Once | INTRAVENOUS | Status: AC
  Administered 2020-12-12: 60 mg via INTRAVENOUS

## 2020-12-12 MED ORDER — ACETAMINOPHEN 650 MG RE SUPP: 650.0000 mg | Freq: Four times a day (QID) | RECTAL | Status: DC | PRN

## 2020-12-12 MED ORDER — FENTANYL CITRATE PF 50 MCG/ML IJ SOSY: 50.0000 ug | PREFILLED_SYRINGE | INTRAMUSCULAR | Status: DC | PRN

## 2020-12-12 MED ORDER — FENTANYL 2500MCG IN NS 250ML (10MCG/ML) PREMIX INFUSION
25.0000 ug/h | INTRAVENOUS | Status: DC
  Administered 2020-12-12: 50 ug/h via INTRAVENOUS
  Filled 2020-12-12: qty 250

## 2020-12-12 MED ORDER — FENTANYL 2500MCG IN NS 250ML (10MCG/ML) PREMIX INFUSION: 0.0000 ug/h | INTRAVENOUS | Status: DC

## 2020-12-12 MED ORDER — FENTANYL BOLUS VIA INFUSION
100.0000 ug | INTRAVENOUS | Status: DC | PRN
  Administered 2020-12-12: 100 ug via INTRAVENOUS
  Filled 2020-12-12: qty 100

## 2020-12-12 MED ORDER — POLYVINYL ALCOHOL 1.4 % OP SOLN: 1.0000 [drp] | Freq: Four times a day (QID) | OPHTHALMIC | Status: DC | PRN

## 2020-12-12 MED ORDER — GLYCOPYRROLATE 1 MG PO TABS: 1.0000 mg | ORAL_TABLET | ORAL | Status: DC | PRN

## 2020-12-12 MED ORDER — DIPHENHYDRAMINE HCL 50 MG/ML IJ SOLN: 25.0000 mg | INTRAMUSCULAR | Status: DC | PRN

## 2020-12-14 ENCOUNTER — Other Ambulatory Visit: Payer: Self-pay | Admitting: Internal Medicine

## 2020-12-16 ENCOUNTER — Encounter (HOSPITAL_COMMUNITY): Payer: Medicare Other | Admitting: Cardiology

## 2020-12-17 LAB — CULTURE, BLOOD (ROUTINE X 2)
Culture: NO GROWTH
Culture: NO GROWTH
Special Requests: ADEQUATE

## 2020-12-17 LAB — PATHOLOGIST SMEAR REVIEW

## 2020-12-21 ENCOUNTER — Other Ambulatory Visit (HOSPITAL_COMMUNITY): Payer: Self-pay | Admitting: Adult Health

## 2020-12-30 ENCOUNTER — Other Ambulatory Visit: Payer: Self-pay | Admitting: Internal Medicine

## 2020-12-30 DIAGNOSIS — I6529 Occlusion and stenosis of unspecified carotid artery: Secondary | ICD-10-CM

## 2021-01-04 ENCOUNTER — Other Ambulatory Visit: Payer: Self-pay | Admitting: Internal Medicine

## 2021-01-04 DIAGNOSIS — I10 Essential (primary) hypertension: Secondary | ICD-10-CM

## 2021-01-10 NOTE — ED Provider Notes (Signed)
[facility] EMERGENCY DEPARTMENT Provider Note   CSN: [id number] Arrival date & time: 15-Dec-2020  1332     History Chief Complaint  Patient presents with   Respiratory Arrest    [name] is a 80 y.o. female.  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  She presented via EMS for low oxygen saturations.  O2 saturations remained 60 or below during EMS transport.  She was given Solu-Medrol, duo nebs, epi, and mag without response.  EMS arrived with a DNR form, and they knew she had recently been in the hospital being treated for pneumonia.  What we found out later, was that she was a hospice patient.  Apparently, she was about to go to an inpatient hospice facility, and the family wanted her to be clean for the transport.  When they tried to move her, the patient became very short of breath.  She sat up and looked as if she was in distress.  At that time, the family did call EMS.  The history is provided by the EMS personnel and a relative (daughter). The history is limited by the condition of the patient.  Shortness of Breath Severity:  Severe Onset quality:  Sudden Duration: just prior to arrival. Timing:  Constant Progression:  Worsening Chronicity:  Chronic Context comment:  Family was attempting to change her in preparation for moving to hospice when she sat up, complained of shortness of breath, and had low O2 sats despite maximum O2 delivery. Relieved by:  Nothing Worsened by:  Activity Ineffective treatments: CPAP per EMS. Associated symptoms: no fever and no vomiting       Past Medical History:  Diagnosis Date   Arthritis    Carotid artery occlusion    Chronic respiratory failure with hypoxia, on home O2 therapy (HCC)    COPD (chronic obstructive pulmonary disease) (HCC)    GERD (gastroesophageal reflux disease)    Glaucoma    Hypertension    Hypothyroidism (acquired)     Pulmonary hypertension ([place])    Vasomotor rhinitis     Patient Active Problem List   Diagnosis Date Noted   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   COPD with acute exacerbation ([place]) 08/27/2020   COVID-19 05/01/2020   Pulmonary hypertension ([place]) 01/15/2020   Hypoxemic respiratory failure, chronic ([place]) 01/15/2020   Lung nodule, multiple 01/15/2020   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   SOB (shortness of breath) 12/26/2019   Acute on chronic respiratory failure with hypoxia ([place]) 12/25/2019   Acute respiratory failure with hypoxemia ([place]) 12/25/2019   Essential hypertension 06/06/2017   Hyperlipidemia 06/06/2017   Leg cramps 06/06/2017   Vertigo 06/06/2017   Macular degeneration 06/06/2017   Hypothyroidism 06/06/2017   Carotid stenosis 01/22/2014   Aftercare following surgery of the circulatory system, NEC 01/02/2013   Occlusion and stenosis of carotid artery without mention of cerebral infarction 04/27/2011    Past Surgical History:  Procedure Laterality Date   ABDOMINAL HYSTERECTOMY     AORTIC ARCH ANGIOGRAPHY N/A 03/01/2017   Procedure: AORTIC ARCH ANGIOGRAPHY;  Surgeon: Serafina [name], MD;  Location: [place] CV LAB;  Service: Cardiovascular;  Laterality: N/A;   ARCH AORTOGRAM N/A 05/04/2011   Procedure: ARCH AORTOGRAM;  Surgeon: Serafina [name], MD;  Location: [facility] CATH LAB;  Service: Cardiovascular;  Laterality: N/A;   ARCH AORTOGRAM N/A 07/04/2012   Procedure: ARCH AORTOGRAM;  Surgeon: Serafina [name], MD;  Location:  [place] CATH LAB;  Service: Cardiovascular;  Laterality: N/A;   CAROTID ANGIOGRAM  July 04, 2012   CAROTID ANGIOGRAM N/A 05/04/2011   Procedure: CAROTID Cyril Loosen;  Surgeon: Serafina [name], MD;  Location: [facility] CATH LAB;  Service: Cardiovascular;  Laterality: N/A;   CAROTID ANGIOGRAM N/A 07/04/2012   Procedure: CAROTID ANGIOGRAM;  Surgeon: Serafina [name], MD;  Location: [facility] CATH LAB;  Service: Cardiovascular;  Laterality: N/A;   CAROTID STENT INSERTION Right  05/11/2011   Procedure: CAROTID STENT INSERTION;  Surgeon: Serafina [name], MD;  Location: [facility] CATH LAB;  Service: Cardiovascular;  Laterality: Right;   carotid subclavian anastomosis  05/11/11   Right subclav. artery transected 12/17/09   CATARACT EXTRACTION     bilateral   EYE SURGERY     RIGHT HEART CATH N/A 09/01/2020   Procedure: RIGHT HEART CATH;  Surgeon: Larey Dresser, MD;  Location: [place] CV LAB;  Service: Cardiovascular;  Laterality: N/A;     OB History   No obstetric history on file.     Family History  Problem Relation Age of Onset   Heart failure Mother    Cancer Father    Breast cancer Neg Hx     Social History   Tobacco Use   Smoking status: Former    Packs/day: 0.30    Years: 5.00    Pack years: 1.50    Types: Cigarettes    Quit date: 04/12/1980    Years since quitting: 40.6   Smokeless tobacco: Never  Vaping Use   Vaping Use: Never used  Substance Use Topics   Alcohol use: No   Drug use: No    Home Medications Prior to Admission medications   Medication Sig Start Date End Date Taking? Authorizing Provider  acetaminophen (TYLENOL) 325 MG tablet Take 650 mg every 6 (six) hours as needed by mouth for mild pain or headache.     [provider]  albuterol (PROVENTIL) (2.5 MG/3ML) 0.083% nebulizer solution Take 3 mLs (2.5 mg total) by nebulization every 6 (six) hours as needed for wheezing or shortness of breath. 08/15/20   Laurin Coder, MD  albuterol (VENTOLIN HFA) 108 (90 Base) MCG/ACT inhaler Inhale 2 puffs into the lungs every 6 (six) hours as needed for wheezing or shortness of breath. 09/18/20   [name], MD  arformoterol (BROVANA) 15 MCG/2ML NEBU Take 2 mLs (15 mcg total) by nebulization 2 (two) times daily. 08/15/20   Laurin Coder, MD  aspirin 81 MG chewable tablet Chew 81 mg by mouth daily.    [provider]  bisacodyl (DULCOLAX) 5 MG EC tablet Take 1 tablet (5 mg total) by mouth daily as needed for moderate  constipation. 11/28/20   Pokhrel, [place], MD  budesonide (PULMICORT) 0.5 MG/2ML nebulizer solution Take 2 mLs (0.5 mg total) by nebulization in the morning and at bedtime. 08/15/20   Laurin Coder, MD  clopidogrel (PLAVIX) 75 MG tablet TAKE 1 TABLET BY MOUTH EVERY DAY 09/30/20   [name], MD  diclofenac Sodium (VOLTAREN) 1 % GEL Apply 2 g topically as needed (leg pain). 11/28/20   Pokhrel, Corrie Mckusick, MD  docusate sodium (COLACE) 100 MG capsule TAKE 1 CAPSULE BY MOUTH EVERY DAY AS NEEDED FOR MILD CONSTIPATION 10/10/20   [name], MD  feeding supplement (ENSURE ENLIVE / ENSURE PLUS) LIQD Take 237 mLs by mouth 2 (two) times daily between meals. 11/28/20 12/28/20  Pokhrel, Corrie Mckusick, MD  fluticasone (FLONASE) 50 MCG/ACT nasal spray  Place 1 spray into both nostrils daily as needed for allergies or rhinitis (for sinus congestion). 01/15/20   [name], MD  fluticasone-salmeterol (ADVAIR HFA) [phone] MCG/ACT inhaler INHALE 2 PUFFS INTO THE LUNGS TWICE A DAY Patient taking differently: Inhale 2 puffs into the lungs 2 (two) times daily. 11/18/20   [name], MD  furosemide (LASIX) 20 MG tablet 1 tab PO every other day Patient taking differently: Take 20 mg by mouth See admin instructions. 1 tablet by mouth every other day 09/18/20   [name], MD  gabapentin (NEURONTIN) 100 MG capsule TAKE 1 CAPSULE (100 MG TOTAL) BY MOUTH AT BEDTIME. 10/11/20   [name], MD  guaiFENesin-dextromethorphan (ROBITUSSIN DM) 100-10 MG/5ML syrup Take 10 mLs by mouth every 4 (four) hours as needed for cough. 05/04/20   Caren Griffins, MD  lansoprazole (PREVACID) 15 MG capsule Take 1 capsule (15 mg total) by mouth daily. 11/18/20   [name], MD  latanoprost (XALATAN) 0.005 % ophthalmic solution Place 1 drop at bedtime into both eyes. 01/15/17   [provider]  levothyroxine (SYNTHROID) 50 MCG tablet Take 1.5 tablets (75 mcg total) by mouth daily. 07/28/20   [name],  MD  loratadine (CLARITIN) 10 MG tablet Take 1 tablet (10 mg total) by mouth daily as needed for allergies. 11/20/20   [name], MD  LORazepam (LORAZEPAM INTENSOL) 2 MG/ML concentrated solution Place 0.3 mLs (0.6 mg total) under the tongue every 4 (four) hours as needed for anxiety. 11/28/20   Pokhrel, Corrie Mckusick, MD  Morphine Sulfate (MORPHINE CONCENTRATE) 10 MG/0.5ML SOLN concentrated solution Place 0.25 mLs (5 mg total) under the tongue every hour as needed for shortness of breath. 11/28/20   Pokhrel, Corrie Mckusick, MD  Multiple Vitamins-Minerals (ALIVE WOMENS ENERGY) TABS Take 1 tablet by mouth daily as needed (supplementation).    [provider]  potassium chloride SA (KLOR-CON) 20 MEQ tablet Take 1 tablet (20 mEq total) by mouth daily. 11/28/20   Pokhrel, Corrie Mckusick, MD  Probiotic Product (PROBIOTIC PO) Take 1 capsule by mouth daily.    [provider]  simvastatin (ZOCOR) 20 MG tablet TAKE 1 TABLET BY MOUTH EVERYDAY AT BEDTIME Patient taking differently: Take 20 mg by mouth at bedtime. TAKE 1 TABLET BY MOUTH EVERYDAY AT BEDTIME 08/20/20   [name], MD    Allergies    Meclizine  Review of Systems   Review of Systems  Unable to perform ROS: Acuity of condition  Constitutional:  Negative for fever.  Respiratory:  Positive for shortness of breath.   Gastrointestinal:  Negative for vomiting.   Physical Exam Updated Vital Signs BP 109/75   Pulse (!) 117   Temp (!) 96.8 F (36 C) (Temporal)   Resp 20   Ht 5' 3" (1.6 m)   Wt 53.5 kg   SpO2 (!) 87%   BMI 20.89 kg/m   Physical Exam Constitutional:      Appearance: She is ill-appearing.  HENT:     Head: Normocephalic and atraumatic.     Nose: Nose normal.     Mouth/Throat:     Mouth: Mucous membranes are dry.  Eyes:     Conjunctiva/sclera: Conjunctivae normal.  Cardiovascular:     Rate and Rhythm: Normal rate and regular rhythm.  Pulmonary:     Effort: Respiratory distress present.     Breath sounds: No  wheezing.  Abdominal:     General: There is no distension.  Tenderness: There is no abdominal tenderness.  Musculoskeletal:        General: No deformity or signs of injury.     Right lower leg: No edema.     Left lower leg: No edema.  Skin:    General: Skin is dry.     Comments: cool  Neurological:     GCS: GCS eye subscore is 1. GCS verbal subscore is 1. GCS motor subscore is 4.    ED Results / Procedures / Treatments   Labs (all labs ordered are listed, but only abnormal results are displayed) Labs Reviewed  CBC WITH DIFFERENTIAL/PLATELET - Abnormal; Notable for the following components:      Result Value   WBC 18.3 (*)    RBC 3.80 (*)    Hemoglobin 11.3 (*)    MCV 105.5 (*)    MCHC 28.2 (*)    Neutro Abs 7.9 (*)    Lymphs Abs 9.2 (*)    Monocytes Absolute 1.3 (*)    All other components within normal limits  BRAIN NATRIURETIC PEPTIDE - Abnormal; Notable for the following components:   B Natriuretic Peptide 870.5 (*)    All other components within normal limits  COMPREHENSIVE METABOLIC PANEL - Abnormal; Notable for the following components:   CO2 11 (*)    Glucose, Bld 358 (*)    Creatinine, Ser 1.20 (*)    Total Protein 6.0 (*)    Albumin 2.7 (*)    GFR, Estimated 45 (*)    Anion gap 19 (*)    All other components within normal limits  LACTIC ACID, PLASMA - Abnormal; Notable for the following components:   Lactic Acid, Venous >11.0 (*)    All other components within normal limits  LACTIC ACID, PLASMA - Abnormal; Notable for the following components:   Lactic Acid, Venous 8.0 (*)    All other components within normal limits  D-DIMER, QUANTITATIVE (NOT AT [facility]) - Abnormal; Notable for the following components:   D-Dimer, Quant 3.71 (*)    All other components within normal limits  I-STAT ARTERIAL BLOOD GAS, ED - Abnormal; Notable for the following components:   pH, Arterial 6.879 (*)    pO2, Arterial 61 (*)    Bicarbonate 8.3 (*)    TCO2 10 (*)    Acid-base  deficit 24.0 (*)    Sodium 106 (*)    Potassium 3.2 (*)    Calcium, Ion 1.02 (*)    HCT 24.0 (*)    Hemoglobin 8.2 (*)    All other components within normal limits  TROPONIN I (HIGH SENSITIVITY) - Abnormal; Notable for the following components:   Troponin I (High Sensitivity) 31 (*)    All other components within normal limits  TROPONIN I (HIGH SENSITIVITY) - Abnormal; Notable for the following components:   Troponin I (High Sensitivity) 40 (*)    All other components within normal limits  RESP PANEL BY RT-PCR (FLU A&B, COVID) ARPGX2  CULTURE, BLOOD (ROUTINE X 2)  CULTURE, BLOOD (ROUTINE X 2)  PATHOLOGIST SMEAR REVIEW    EKG EKG Interpretation  Date/Time:  2020-12-22 13:36:59 EDT Ventricular Rate:  116 PR Interval:    QRS Duration: 76 QT Interval:  275 QTC Calculation: 382 R Axis:   45 Text Interpretation: Sinus tachycardia Abnormal R-wave progression, early transition Nonspecific repol abnormality, diffuse leads No acute ischemia Confirmed by Lorre Munroe ([id number]) on 12-22-20 1:59:31 PM  Radiology No results found.  Procedures Procedure Name: Intubation Date/Time: 22-Dec-2020  2:11 PM Performed by: Arnaldo Natal, MD Pre-anesthesia Checklist: Patient identified, Patient being monitored, Emergency Drugs available, Timeout performed and Suction available Oxygen Delivery Method: Ambu bag Preoxygenation: Pre-oxygenation with 100% oxygen Induction Type: Rapid sequence Ventilation: Mask ventilation without difficulty Laryngoscope Size: Glidescope and 3 Grade View: Grade I Tube size: 7.5 mm Number of attempts: 1 Airway Equipment and Method: Video-laryngoscopy Placement Confirmation: ETT inserted through vocal cords under direct vision, Positive ETCO2, Breath sounds checked- equal and bilateral and CO2 detector Comments: edentulous    .Critical Care  Date/Time: 12/15/2020 7:12 AM Performed by: Arnaldo Natal, MD Authorized by: Arnaldo Natal, MD   Critical  care provider statement:    Critical care time (minutes):  60   Critical care time was exclusive of:  Separately billable procedures and treating other patients and teaching time   Critical care was necessary to treat or prevent imminent or life-threatening deterioration of the following conditions:  Respiratory failure and circulatory failure   Critical care was time spent personally by me on the following activities:  Development of treatment plan with patient or surrogate, discussions with consultants, evaluation of patient's response to treatment, examination of patient, obtaining history from patient or surrogate, ordering and performing treatments and interventions, ordering and review of laboratory studies, ordering and review of radiographic studies, pulse oximetry, re-evaluation of patient's condition, review of old charts and transcutaneous pacing   I assumed direction of critical care for this patient from another provider in my specialty: no     Medications Ordered in ED Medications  rocuronium (ZEMURON) injection 60 mg (60 mg Intravenous Given Dec 13, 2020 1336)  etomidate (AMIDATE) injection 20 mg (20 mg Intravenous Given 12/13/20 1335)  vancomycin (VANCOCIN) IVPB 1000 mg/200 mL premix (0 mg Intravenous Stopped 12/13/20 1526)  ceFEPIme (MAXIPIME) 2 g in sodium chloride 0.9 % 100 mL IVPB (0 g Intravenous Stopped 12/13/20 1444)  lactated ringers bolus 1,605 mL (0 mL/kg  53.5 kg Intravenous Stopped Dec 13, 2020 1608)    ED Course  I have reviewed the triage vital signs and the nursing notes.  Pertinent labs & imaging results that were available during my care of the patient were reviewed by me and considered in my medical decision making (see chart for details).  Clinical Course as of 12/15/20 0710  12/13/20  Garner at bedside. Will discuss plan of care with daughters.  [AW]    Clinical Course User Index [AW] Arnaldo Natal, MD   MDM Rules/Calculators/A&P                            [name] arrived in extreme respiratory distress.  She also appeared to be in shock with a low blood pressure and cool extremities.  At 1 point, just after transport from the EMS stretcher to the ED stretcher, we thought that we had lost her pulses because they were so thready.  She had a DNR with her but no DNI order, and EMS and hospital staff did not know she was a hospice patient.  Therefore, she was intubated.  Family showed up shortly after the resuscitation, and they wanted to speak with another family member prior to deciding to withdraw or de-escalate care.  She is being treated presumptively for hospital-acquired pneumonia.  Further diagnostic tests are pending. Final Clinical Impression(s) / ED Diagnoses Final diagnoses:  Respiratory arrest ([place])  Hypoxemic respiratory failure, chronic (HCC)  COPD with acute exacerbation (  [place])  Pulmonary hypertension ([employer])    Rx / DC Orders ED Discharge Orders     None        Arnaldo Natal, MD 12/15/20 [id number]

## 2021-01-10 NOTE — Progress Notes (Signed)
MD made aware of patients SATs dropping. Informed MD RT increased PEEP to 10 from 8 with no changes in SATs. No changes to be made at this time.

## 2021-01-10 NOTE — Progress Notes (Signed)
Brief Palliative Medicine Progress Note:  PMT consult received and chart reviewed.   Noted Mrs. Nauman is a current hospice patient with [facility] I spoke with [facility] liaison, Chrislyn Edison Pace, who is aware the patient is in the ED. Chrislyn informs me patient was being admitted to [facility] today, but unfortunately had a decline before transfer, which is when family called EMS. Chrislyn has already been to the hospital to discuss [place] as there is a previous established relationship with Mrs. Burford and family. She does not feel PMT involvement is needed at this time. Chrislyn states family requested time to consider options and to make decision to extubate. Family have hospice 24 hour number to reach out to with any questions/concerns/decisions.  Attempted to return consult page from Dr. Tomi Bamberger.   ACC will reach out to PMT directly if any needs arise.   Thank you for allowing PMT to assist in the care of this patient.  Nevyn Bossman M. Tamala Julian [facility] Palliative Medicine Team Team Phone: [phone] NO CHARGE

## 2021-01-10 NOTE — Progress Notes (Addendum)
[facility]) Hospitalized Hospice Patient Note     This patient is a current hospice patient, on service with a terminal diagnosis of pulmonary hypertension.  Pt has been declining in recent days, and family made the decision to transfer to [facility] inpatient hospice facility for end of life care.  A bed was offered for today, consents had been completed, and nonemergent transport had been called.   While waiting for transport, pt decompensated, and family activated 911.    Visited at bedside with pt and pt's daughters Caprice Red and [name].  Report exchanged with [facility] ED care team and with Elmer Picker, NP with PMT. At this time family would like for pt to remain intubated while they consider their options. Offered to have the chaplain paged; family declined. Contact numbers for [facility] liaison team as well as 24h ACC on call left with family.  ACC care team updated.  ACC will continue to follow for any discharge planning needs and to coordinate continuation of hospice care.    Thank you for the opportunity to participate in this patient's care.     Domenic Moras, BSN, RN [facility] Liaison [phone] (24h on call)

## 2021-01-10 NOTE — Progress Notes (Signed)
12-20-2020 1900  Clinical Encounter Type  Visited With Patient and family together  Visit Type Initial;Spiritual support  Referral From Nurse  Consult/Referral To Chaplain  Spiritual Encounters  Spiritual Needs Sacred text;Prayer;Ritual;Grief support;Emotional  Stress Factors  Patient Stress Factors Not reviewed  Family Stress Factors Major life changes;Loss of control;Loss  Met with family at bedside of Mrs. [name].  Daughter requested counsel and prayer.  Met two additional children at bedside, prayed with family and for patient.  Provided opportunity for family to express their wishes to bless their mother and comfort one another at this time.  Stayed with family throughout hour.  Prayed for patient immediately prior death.    Respectfully submitted, Mammie Lorenzo

## 2021-01-10 NOTE — Discharge Planning (Signed)
[facility] made aware of pt arrival.  Plan was for pt to be admitted at [facility] when bed becomes available.  Liaison will visit family and update ED team with disposition plan.

## 2021-01-10 NOTE — Consult Note (Signed)
NAME:  [name], MRN:  [id number], DOB:  [birth year], LOS: 0 ADMISSION DATE:  01-08-2021, CONSULTATION DATE:  01/08/2021 REFERRING MD:  EDP, CHIEF COMPLAINT:  SOB   History of Present Illness:  80 year old woman with hx of end stage COPD and pulmonary HTN who presented with unresponsiveness and profound SOB.  She was actually planning on transport to inpatient hospice but unfortunately deteriorated at home prior to this so EMS was called.  In ER, there was some confusion regarding DNI status so patient emergently intubated while awaiting family.  Family now at bedside and are a bit overwhelmed, processing her imminent demise.  PCCM consulted for potential admission.  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 rate 20, height _0  (1.6 m), weight 53.5 kg, SpO2 (!) 86 %.    Vent Mode: PRVC FiO2 (%):  [100 %] 100 % Set Rate:  [20 bmp] 20 bmp Vt Set:  [410 mL] 410 mL PEEP:  [8 cmH20-10 cmH20] 10 cmH20 Plateau Pressure:  [16 cmH20-19 cmH20] 19 cmH20   Intake/Output Summary (Last 24 hours) at 01-08-2021 1721 Last data filed at January 08, 2021 1608 Gross per 24 hour  Intake 1605 ml  Output --  Net 1605 ml   Filed Weights   2021/01/08 1300 Jan 08, 2021 1425  Weight: 53.5 kg 53.5 kg    Examination: Ill appearing woman passive on vent Able to move arms per family, she does not for me Ext are cool to touch Pupils reactive but sluggish Sats 85% on 100%/PEEP 10  Profound hypoxemia and acidemia on ABG latter is primarily  metabolic CXR no acute change Lactate > 10  Resolved Hospital Problem list   N/a  Assessment & Plan:  End stage COPD with respiratory vs. Metabolic arrest- could be a PE, could be a seizure, could be aspiration (although CXR clear).  Regardless, we are unable to oxygenate and ventilate her.  She is telling us its her time.  She is enrolled in hospice and has previously stated that she does not want advanced life support in setting of incurable baseline pulmonary disease.  Family is in grieving process but seem to understand. - Comfort measures - Family will let us know when they would like compassionate extubation - Liberalize fentanyl - If family needs more time and ER needs bed, please page CCM and we can put in an admit order  Best Practice (right click and "Reselect all SmartList Selections" daily)  N/a  Labs   CBC: Recent Labs  Lab 08-Jan-2021 1349 2021-01-08 1459  WBC 18.3*  --   NEUTROABS 7.9*  --   HGB 11.3* 8.2*  HCT 40.1 24.0*  MCV 105.5*  --   PLT 239  --     Basic Metabolic Panel: Recent Labs  Lab January 08, 2021 1349 2021/01/08 1459  NA 141 106*  K 4.2 3.2*  CL 111  --   CO2 11*  --   GLUCOSE 358*  --   BUN 9  --   CREATININE 1.20*  --   CALCIUM 9.2  --  GFR: Estimated Creatinine Clearance: 29.9 mL/min (A) (by C-G formula based on SCr of 1.2 mg/dL (H)). Recent Labs  Lab 12-17-20 1349 12/17/2020 1400  WBC 18.3*  --   LATICACIDVEN  --  >11.0*    Liver Function Tests: Recent Labs  Lab 12-17-2020 1349  AST 39  ALT 19  ALKPHOS 49  BILITOT 0.4  PROT 6.0*  ALBUMIN 2.7*   No results for input(s): LIPASE, AMYLASE in the last 168 hours. No results for input(s): AMMONIA in the last 168 hours.  ABG    Component Value Date/Time   PHART 6.879 (LL) 2020/12/17 1459   PCO2ART 43.5 12-17-2020 1459   PO2ART 61 (L) Dec 17, 2020 1459   HCO3 8.3 (L) 12/17/20 1459   TCO2 10 (L) 12-17-20 1459   ACIDBASEDEF 24.0 (H) 12-17-2020 1459   O2SAT 73.0 Dec 17, 2020 1459      Coagulation Profile: No results for input(s): INR, PROTIME in the last 168 hours.  Cardiac Enzymes: No results for input(s): CKTOTAL, CKMB, CKMBINDEX, TROPONINI in the last 168 hours.  HbA1C: Hgb A1c MFr Bld  Date/Time Value Ref Range Status  12/26/2019 07:09 AM 5.7 (H) 4.8 - 5.6 % Final    Comment:    (NOTE) Pre diabetes:          5.7%-6.4%  Diabetes:              >6.4%  Glycemic control for   <7.0% adults with diabetes   04/17/2008 10:40 PM (H) 4.6 - 6.1 % Final   6.2 (NOTE)   The ADA recommends the following therapeutic goal for glycemic   control related to Hgb A1C measurement:   Goal of Therapy:   < 7.0% Hgb A1C   Reference: American Diabetes Association: Clinical Practice   Recommendations 2008, Diabetes Care,  2008, 31:(Suppl 1).    CBG: No results for input(s): GLUCAP in the last 168 hours.  Review of Systems:   Comatose on vent  Past Medical History:  She,  has a past medical history of Arthritis, Carotid artery occlusion, Chronic respiratory failure with hypoxia, on home O2 therapy ([place]), COPD (chronic obstructive pulmonary disease) ([place]), GERD (gastroesophageal reflux disease), Glaucoma, Hypertension, Hypothyroidism (acquired), Pulmonary hypertension ([place]), and Vasomotor rhinitis.   Surgical History:   Past Surgical History:  Procedure Laterality Date   ABDOMINAL HYSTERECTOMY     AORTIC ARCH ANGIOGRAPHY N/A 03/01/2017   Procedure: AORTIC ARCH ANGIOGRAPHY;  Surgeon: Serafina [name], MD;  Location: [facility];  Service: Cardiovascular;  Laterality: N/A;   ARCH AORTOGRAM N/A 05/04/2011   Procedure: ARCH AORTOGRAM;  Surgeon: Serafina [name], MD;  Location: [facility] CATH LAB;  Service: Cardiovascular;  Laterality: N/A;   ARCH AORTOGRAM N/A 07/04/2012   Procedure: ARCH AORTOGRAM;  Surgeon: Serafina [name], MD;  Location: [facility] CATH LAB;  Service: Cardiovascular;  Laterality: N/A;   CAROTID ANGIOGRAM  July 04, 2012   CAROTID ANGIOGRAM N/A 05/04/2011   Procedure:  CAROTID Cyril Loosen;  Surgeon: Serafina [name], MD;  Location: [facility] CATH LAB;  Service: Cardiovascular;  Laterality: N/A;   CAROTID ANGIOGRAM N/A 07/04/2012   Procedure: CAROTID ANGIOGRAM;  Surgeon: Serafina [name], MD;  Location: [facility] CATH LAB;  Service: Cardiovascular;  Laterality: N/A;   CAROTID STENT INSERTION Right 05/11/2011   Procedure: CAROTID STENT INSERTION;  Surgeon: Serafina [name], MD;  Location: [facility] CATH LAB;  Service: Cardiovascular;  Laterality: Right;   carotid subclavian anastomosis  05/11/11   Right subclav. artery transected 12/17/09   CATARACT EXTRACTION  bilateral   EYE SURGERY     RIGHT HEART CATH N/A 09/01/2020   Procedure: RIGHT HEART CATH;  Surgeon: Larey Dresser, MD;  Location: [facility];  Service: Cardiovascular;  Laterality: N/A;     Social History:   reports that she quit smoking about 40 years ago. Her smoking use included cigarettes. She has a 1.50 pack-year smoking history. She has never used smokeless tobacco. She reports that she does not drink alcohol and does not use drugs.   Family History:  Her family history includes Cancer in her father; Heart failure in her mother. There is no history of Breast cancer.   Allergies Allergies  Allergen Reactions   Meclizine Nausea And Vomiting     Home Medications  Prior to Admission medications   Medication Sig Start Date End Date Taking? Authorizing Provider  acetaminophen (TYLENOL) 325 MG tablet Take 650 mg every 6 (six) hours as needed by mouth for mild pain or headache.     [provider]  albuterol (PROVENTIL) (2.5 MG/3ML) 0.083% nebulizer solution Take 3 mLs (2.5 mg total) by nebulization every 6 (six) hours as needed for wheezing or shortness of breath. 08/15/20   Laurin Coder, MD  albuterol (VENTOLIN HFA) 108 (90 Base) MCG/ACT inhaler Inhale 2 puffs into the lungs every 6 (six) hours as needed for wheezing or shortness of breath. 09/18/20   [name], MD  amLODipine (NORVASC) 5 MG  tablet Take 5 mg by mouth daily.    [provider]  arformoterol (BROVANA) 15 MCG/2ML NEBU Take 2 mLs (15 mcg total) by nebulization 2 (two) times daily. 08/15/20   Laurin Coder, MD  aspirin 81 MG chewable tablet Chew 81 mg by mouth daily.    [provider]  bisacodyl (DULCOLAX) 5 MG EC tablet Take 1 tablet (5 mg total) by mouth daily as needed for moderate constipation. 11/28/20   Pokhrel, Laxman, MD  budesonide (PULMICORT) 0.5 MG/2ML nebulizer solution Take 2 mLs (0.5 mg total) by nebulization in the morning and at bedtime. 08/15/20   Sherrilyn Rist A, MD  clopidogrel (PLAVIX) 75 MG tablet TAKE 1 TABLET BY MOUTH EVERY DAY Patient taking differently: Take 75 mg by mouth daily. 09/30/20   [name], MD  diclofenac Sodium (VOLTAREN) 1 % GEL Apply 2 g topically as needed (leg pain). 11/28/20   Pokhrel, Corrie Mckusick, MD  docusate sodium (COLACE) 100 MG capsule TAKE 1 CAPSULE BY MOUTH EVERY DAY AS NEEDED FOR MILD CONSTIPATION Patient taking differently: Take 100 mg by mouth daily as needed for mild constipation. 10/10/20   [name], MD  feeding supplement (ENSURE ENLIVE / ENSURE PLUS) LIQD Take 237 mLs by mouth 2 (two) times daily between meals. 11/28/20 12/28/20  Pokhrel, Corrie Mckusick, MD  fluticasone (FLONASE) 50 MCG/ACT nasal spray Place 1 spray into both nostrils daily as needed for allergies or rhinitis (for sinus congestion). 01/15/20   [name], MD  fluticasone-salmeterol (ADVAIR HFA) [phone] MCG/ACT inhaler INHALE 2 PUFFS INTO THE LUNGS TWICE A DAY Patient taking differently: Inhale 2 puffs into the lungs 2 (two) times daily. 11/18/20   [name], MD  furosemide (LASIX) 20 MG tablet 1 tab PO every other day Patient taking differently: Take 20 mg by mouth every other day. 09/18/20   [name], MD  gabapentin (NEURONTIN) 100 MG capsule TAKE 1 CAPSULE (100 MG TOTAL) BY MOUTH AT BEDTIME. 10/11/20   [name], MD  guaiFENesin-dextromethorphan  [facility]  DM) 100-10 MG/5ML syrup Take 10 mLs by mouth every 4 (four) hours as needed for cough. 05/04/20   Caren Griffins, MD  lansoprazole (PREVACID) 15 MG capsule Take 1 capsule (15 mg total) by mouth daily. 11/18/20   [name], MD  latanoprost (XALATAN) 0.005 % ophthalmic solution Place 1 drop at bedtime into both eyes. 01/15/17   [provider]  levothyroxine (SYNTHROID) 50 MCG tablet Take 1.5 tablets (75 mcg total) by mouth daily. Patient taking differently: Take 50 mcg by mouth daily. 07/28/20   [name], MD  lisinopril (ZESTRIL) 20 MG tablet Take 20 mg by mouth daily.    [provider]  loratadine (CLARITIN) 10 MG tablet Take 1 tablet (10 mg total) by mouth daily as needed for allergies. 11/20/20   [name], MD  LORazepam (LORAZEPAM INTENSOL) 2 MG/ML concentrated solution Place 0.3 mLs (0.6 mg total) under the tongue every 4 (four) hours as needed for anxiety. 11/28/20   Pokhrel, Corrie Mckusick, MD  macitentan (OPSUMIT) 10 MG tablet Take 10 mg by mouth daily.    [provider]  metoprolol succinate (TOPROL-XL) 25 MG 24 hr tablet Take 25 mg by mouth daily.    [provider]  Morphine Sulfate (MORPHINE CONCENTRATE) 10 MG/0.5ML SOLN concentrated solution Place 0.25 mLs (5 mg total) under the tongue every hour as needed for shortness of breath. 11/28/20   Pokhrel, Corrie Mckusick, MD  Multiple Vitamins-Minerals (ALIVE WOMENS ENERGY) TABS Take 1 tablet by mouth daily as needed (supplementation).    [provider]  potassium chloride SA (KLOR-CON) 20 MEQ tablet Take 1 tablet (20 mEq total) by mouth daily. 11/28/20   Pokhrel, Corrie Mckusick, MD  predniSONE (DELTASONE) 5 MG tablet Take 5 mg by mouth daily. 2020/12/23   [provider]  Probiotic Product (PROBIOTIC PO) Take 1 capsule by mouth daily.    [provider]  simvastatin (ZOCOR) 20 MG tablet TAKE 1 TABLET BY MOUTH EVERYDAY AT BEDTIME Patient taking differently: Take 20 mg by  mouth at bedtime. TAKE 1 TABLET BY MOUTH EVERYDAY AT BEDTIME 08/20/20   [name], MD     Critical care time: 32 minutes

## 2021-01-10 NOTE — ED Provider Notes (Addendum)
Patient initially seen by Dr. Joya Gaskins.  Please see her note.  Patient's oxygen saturation is in the 80s now despite being on the ventilator.  Goals of care with patient's family.  They confirmed they have not quite decided on extubation at this point but they do agree with comfort measures and no escalation in treatments.  I have placed a palliative care consult.  Plan  on comfort measures.   Dorie Rank, MD 12/18/20 1711 Notified by nursing staff that the family is ready to extubate the patient at this time.  Extubation order placed.   Dorie Rank, MD 12-18-2020 1816  Pt expired at 1844.  I spoke with on call for [facility].  Unable to reach on call for PCP.  I will sign death certificate.       Dorie Rank, MD December 18, 2020 [phone]

## 2021-01-10 NOTE — ED Notes (Signed)
Time of death pronounced at 1844 by this RN Bartholomew Boards and Marlise Eves, RN.

## 2021-01-10 NOTE — ED Notes (Signed)
Wasted 204.7 mL with [place]. Bag cut and drained down sink.

## 2021-01-10 NOTE — ED Triage Notes (Signed)
Pt BIB [facility] in respiratory distress on BIPAP w/ O2 sats in the 60%. Shortly after arriving, Dr. Joya Gaskins intubated. Pt given 125 mg solumedrol, duoneb tx, 0.3 mg of epi IM and 2 g of mag.

## 2021-01-10 NOTE — ED Notes (Signed)
Patient extubated at this time per order with Marianna Fuss, RT at bedside along with pt's family.

## 2021-01-10 NOTE — Telephone Encounter (Signed)
PC placed to pt's daughter [name]. I told her that I just learned of her mother's passing and I was calling to extend my condolences to her and the rest of the family. She was appreciative of everything I did for her mom and was thank me for the call.

## 2021-01-10 DEATH — deceased

## 2021-01-19 ENCOUNTER — Other Ambulatory Visit: Payer: Self-pay | Admitting: Internal Medicine

## 2021-01-19 DIAGNOSIS — I10 Essential (primary) hypertension: Secondary | ICD-10-CM

## 2021-01-30 ENCOUNTER — Other Ambulatory Visit: Payer: Self-pay | Admitting: Internal Medicine

## 2021-02-09 ENCOUNTER — Other Ambulatory Visit: Payer: Self-pay | Admitting: Internal Medicine

## 2021-02-09 DIAGNOSIS — E039 Hypothyroidism, unspecified: Secondary | ICD-10-CM

## 2021-02-10 ENCOUNTER — Other Ambulatory Visit: Payer: Self-pay | Admitting: Internal Medicine

## 2022-07-03 IMAGING — CT CT ANGIO CHEST
2 of 6 series · 18 of 36 positions shown · IV contrast (omnipaque)
Comparison: 05/01/2020, chest x-ray from 08/27/2020

CLINICAL DATA: Shortness of breath and hypoxia

EXAM:
CT ANGIOGRAPHY CHEST WITH CONTRAST
TECHNIQUE: Multidetector CT imaging of the chest was performed using the
standard protocol during bolus administration of intravenous
contrast. Multiplanar CT image reconstructions and MIPs were
obtained to evaluate the vascular anatomy.
CONTRAST:  75mL OMNIPAQUE IOHEXOL 350 MG/ML SOLN

[Series 7: pe thins · axial · 0.66mm/px · z∈[+979,+1236]mm · 17 of 410 slices shown]
[im 21/410  lung]
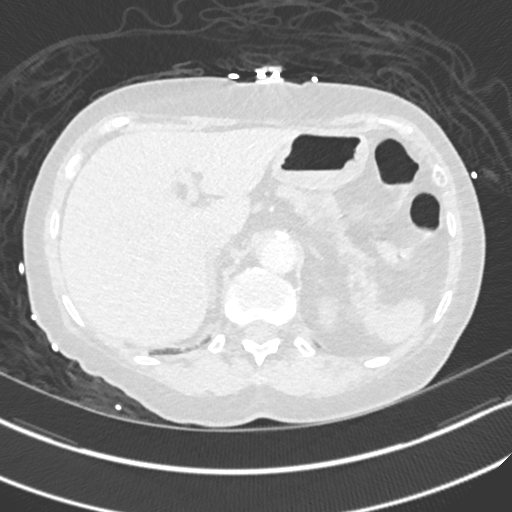
[im 41/410  mediastinal]
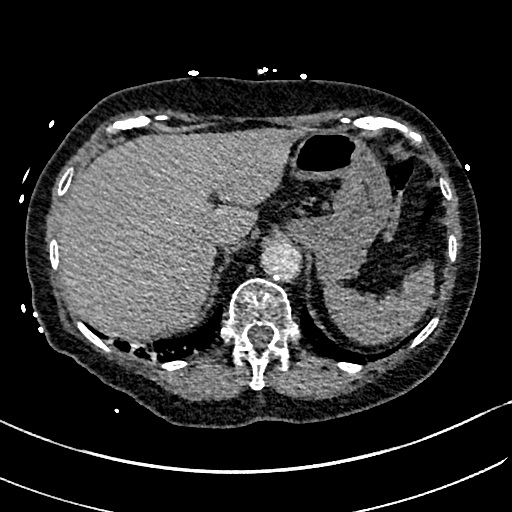
[im 62/410  lung]
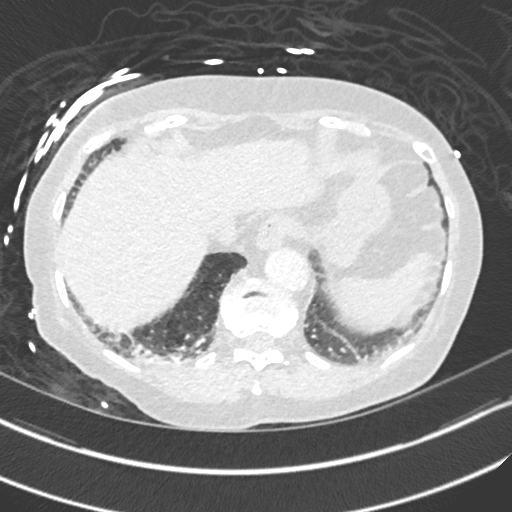
[im 82/410  mediastinal]
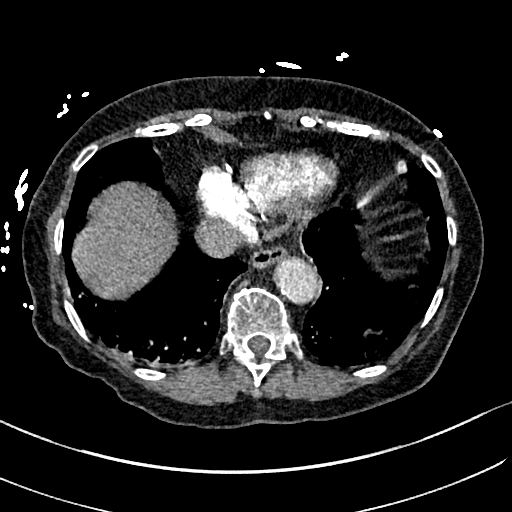
[im 123/410  lung]
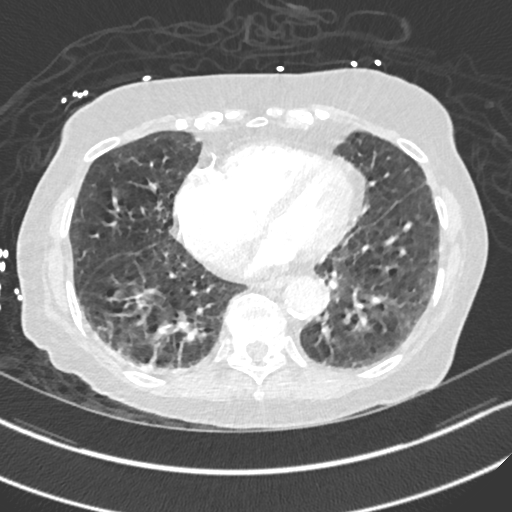
[im 144/410  mediastinal]
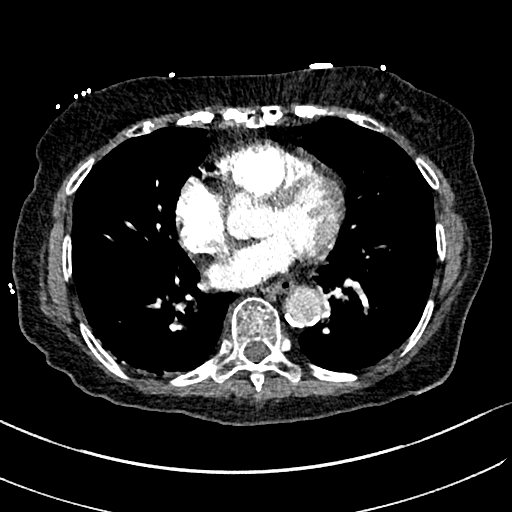
[im 164/410  lung]
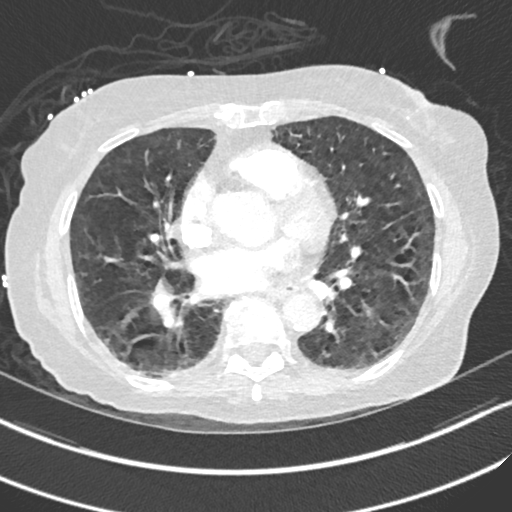
[im 185/410  mediastinal]
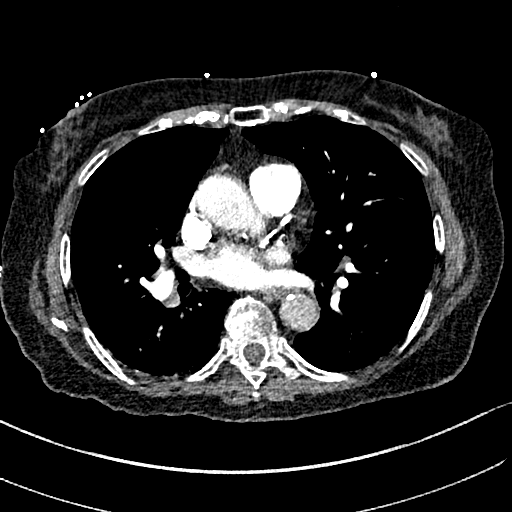
[im 205/410  lung]
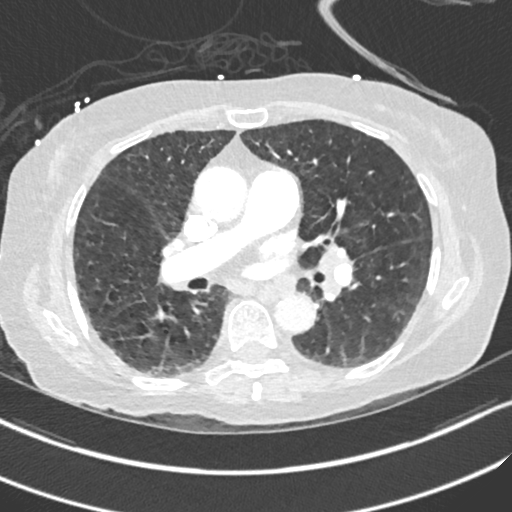
[im 225/410  mediastinal]
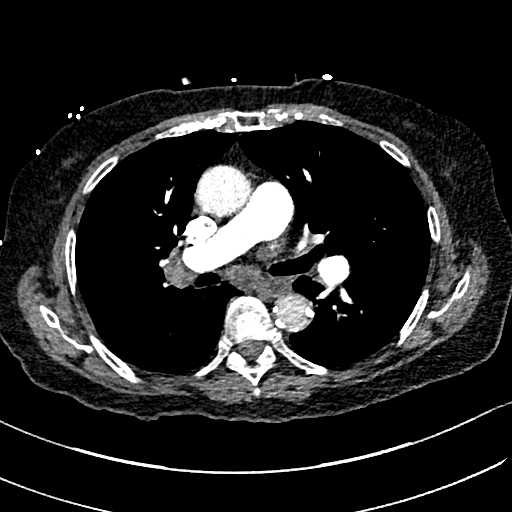
[im 246/410  lung]
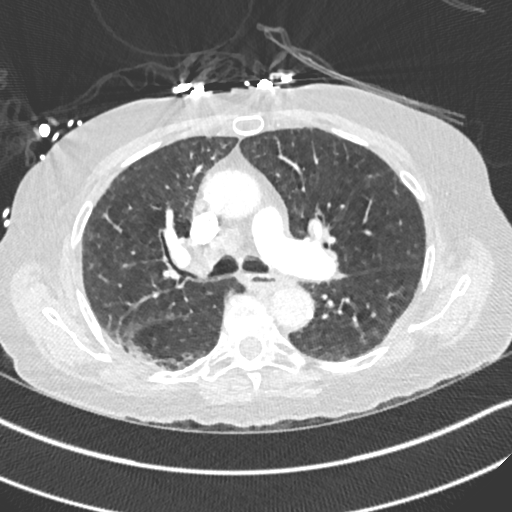
[im 266/410  mediastinal]
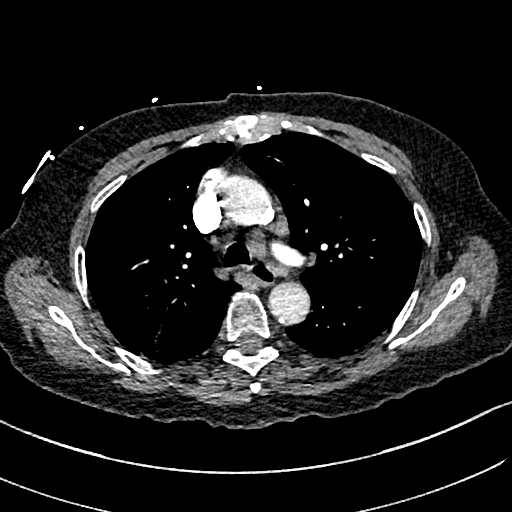
[im 287/410  lung]
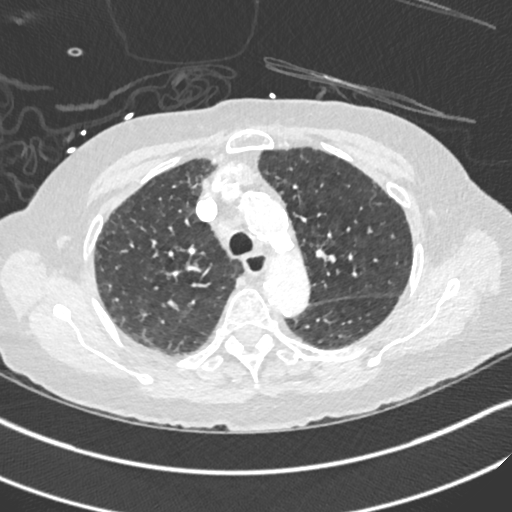
[im 328/410  mediastinal]
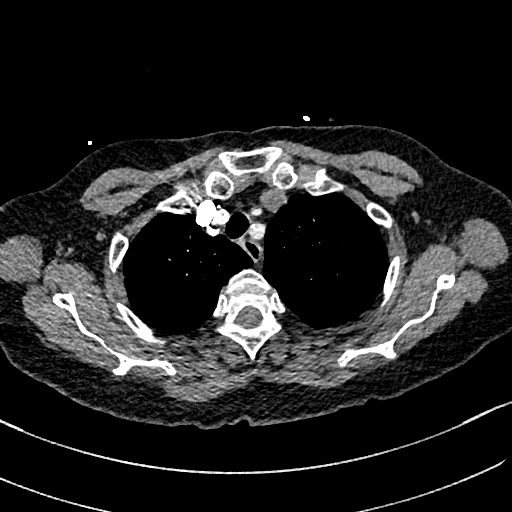
[im 348/410  lung]
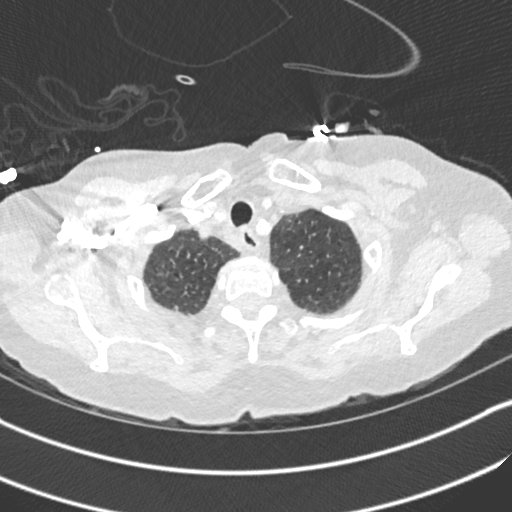
[im 369/410  mediastinal]
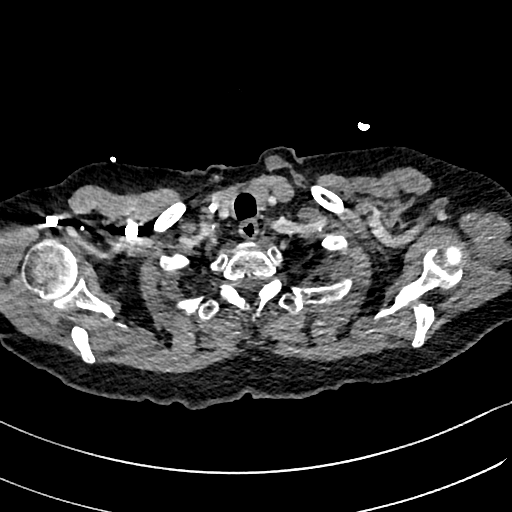
[im 389/410  lung]
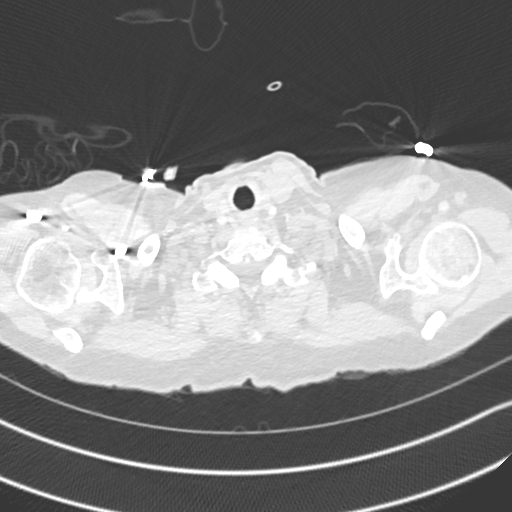

[Series 8: pe 2mm cor · coronal · 0.61mm/px · 1 of 105 slices shown]
[im 53/105  mediastinal]
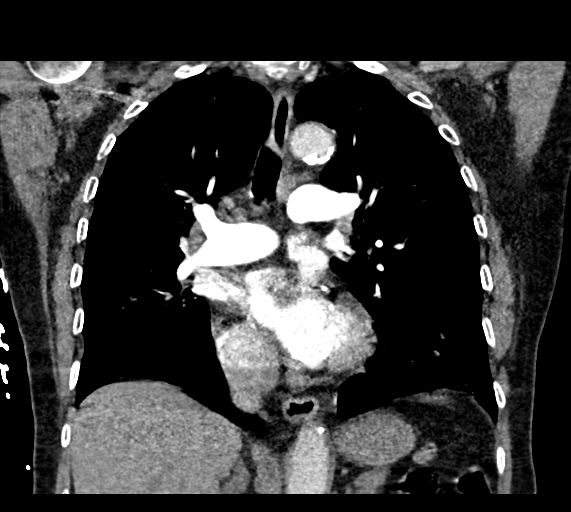

[18 of 36 positions shown; findings below may reference images not displayed]

FINDINGS: Cardiovascular: Thoracic aorta and its branches demonstrate
atherosclerotic calcification. No aneurysmal dilatation or focal
dissection is seen although the descending thoracic aorta is limited
in the degree of opacification. No cardiac enlargement is noted.
Coronary calcifications are seen. The pulmonary artery is well
visualized within normal branching pattern. No large central
pulmonary embolism is seen. The peripheral branches are somewhat
limited in the lower lobes due to motion artifact. No discrete
embolus is seen.

Mediastinum/Nodes: Thoracic inlet is within normal limits. Scattered
hilar and mediastinal lymph nodes are seen. These measure up to 10
mm in short axis and likely reactive in nature. The esophagus as
visualized is within normal limits.

Lungs/Pleura: Emphysematous changes of lungs are noted. No focal
infiltrate or sizable effusion is noted. Mild right basilar
atelectasis is seen. No sizable parenchymal nodules are noted.

Upper Abdomen: Visualized upper abdomen is within normal limits.

Musculoskeletal: Degenerative changes of the thoracic spine are
noted.

Review of the MIP images confirms the above findings.
IMPRESSION: No definitive pulmonary emboli. The lower lobe branches are somewhat
limited due to patient motion artifact and incomplete opacification.

Scattered hilar and mediastinal lymph nodes measuring up to 10 mm in
short axis. These are likely reactive in nature. Possibility of
underlying process such as sarcoidosis would deserve consideration
as well. These are stable from the prior CT examination.

Mild emphysematous changes with mild right basilar atelectasis.

Aortic Atherosclerosis (APOVJ-C4Y.Y) and Emphysema (APOVJ-7GN.N).
